# Patient Record
Sex: Female | Born: 1940 | Race: White | Hispanic: No | Marital: Married | Smoking: Former smoker
Health system: Southern US, Community
[De-identification: ages and names within clinical notes are randomized; demographics above are authoritative.]

## PROBLEM LIST (undated history)

## (undated) DIAGNOSIS — M1990 Unspecified osteoarthritis, unspecified site: Secondary | ICD-10-CM

## (undated) DIAGNOSIS — J45909 Unspecified asthma, uncomplicated: Secondary | ICD-10-CM

## (undated) DIAGNOSIS — I499 Cardiac arrhythmia, unspecified: Secondary | ICD-10-CM

## (undated) DIAGNOSIS — K5790 Diverticulosis of intestine, part unspecified, without perforation or abscess without bleeding: Secondary | ICD-10-CM

## (undated) DIAGNOSIS — K635 Polyp of colon: Secondary | ICD-10-CM

## (undated) DIAGNOSIS — Z8709 Personal history of other diseases of the respiratory system: Secondary | ICD-10-CM

## (undated) DIAGNOSIS — T4145XA Adverse effect of unspecified anesthetic, initial encounter: Secondary | ICD-10-CM

## (undated) DIAGNOSIS — I4891 Unspecified atrial fibrillation: Secondary | ICD-10-CM

## (undated) DIAGNOSIS — Z8679 Personal history of other diseases of the circulatory system: Secondary | ICD-10-CM

## (undated) DIAGNOSIS — F32A Depression, unspecified: Secondary | ICD-10-CM

## (undated) DIAGNOSIS — I10 Essential (primary) hypertension: Secondary | ICD-10-CM

## (undated) DIAGNOSIS — K8020 Calculus of gallbladder without cholecystitis without obstruction: Secondary | ICD-10-CM

## (undated) DIAGNOSIS — F329 Major depressive disorder, single episode, unspecified: Secondary | ICD-10-CM

## (undated) DIAGNOSIS — R911 Solitary pulmonary nodule: Secondary | ICD-10-CM

## (undated) DIAGNOSIS — Z9889 Other specified postprocedural states: Secondary | ICD-10-CM

## (undated) DIAGNOSIS — M84369A Stress fracture, unspecified tibia and fibula, initial encounter for fracture: Secondary | ICD-10-CM

## (undated) DIAGNOSIS — F419 Anxiety disorder, unspecified: Secondary | ICD-10-CM

## (undated) DIAGNOSIS — M8580 Other specified disorders of bone density and structure, unspecified site: Secondary | ICD-10-CM

## (undated) DIAGNOSIS — J479 Bronchiectasis, uncomplicated: Secondary | ICD-10-CM

## (undated) DIAGNOSIS — T8859XA Other complications of anesthesia, initial encounter: Secondary | ICD-10-CM

## (undated) DIAGNOSIS — G4763 Sleep related bruxism: Secondary | ICD-10-CM

## (undated) DIAGNOSIS — Z9109 Other allergy status, other than to drugs and biological substances: Secondary | ICD-10-CM

## (undated) DIAGNOSIS — R351 Nocturia: Secondary | ICD-10-CM

## (undated) DIAGNOSIS — R112 Nausea with vomiting, unspecified: Secondary | ICD-10-CM

## (undated) HISTORY — DX: Calculus of gallbladder without cholecystitis without obstruction: K80.20

## (undated) HISTORY — DX: Anxiety disorder, unspecified: F41.9

## (undated) HISTORY — PX: VAGINAL HYSTERECTOMY: SUR661

## (undated) HISTORY — PX: BREAST CYST EXCISION: SHX579

## (undated) HISTORY — DX: Essential (primary) hypertension: I10

## (undated) HISTORY — DX: Polyp of colon: K63.5

## (undated) HISTORY — DX: Unspecified atrial fibrillation: I48.91

## (undated) HISTORY — DX: Depression, unspecified: F32.A

## (undated) HISTORY — DX: Solitary pulmonary nodule: R91.1

## (undated) HISTORY — DX: Diverticulosis of intestine, part unspecified, without perforation or abscess without bleeding: K57.90

## (undated) HISTORY — PX: INCISION AND DRAINAGE BREAST ABSCESS: SUR672

## (undated) HISTORY — PX: VESICOVAGINAL FISTULA CLOSURE W/ TAH: SUR271

## (undated) HISTORY — DX: Unspecified asthma, uncomplicated: J45.909

## (undated) HISTORY — DX: Nocturia: R35.1

## (undated) HISTORY — PX: CHOLECYSTECTOMY: SHX55

## (undated) HISTORY — PX: BREAST SURGERY: SHX581

## (undated) HISTORY — DX: Major depressive disorder, single episode, unspecified: F32.9

---

## 2007-05-03 ENCOUNTER — Ambulatory Visit: Payer: Self-pay | Admitting: Emergency Medicine

## 2007-05-03 LAB — CONVERTED CEMR LAB
BUN: 15 mg/dL (ref 6–23)
CO2: 30 meq/L (ref 19–32)
GFR calc Af Amer: 107 mL/min
Glucose, Bld: 87 mg/dL (ref 70–99)
Potassium: 4.5 meq/L (ref 3.5–5.1)
Sodium: 138 meq/L (ref 135–145)

## 2007-05-07 ENCOUNTER — Ambulatory Visit: Payer: Self-pay | Admitting: Internal Medicine

## 2007-05-12 DIAGNOSIS — J45909 Unspecified asthma, uncomplicated: Secondary | ICD-10-CM | POA: Insufficient documentation

## 2007-05-12 DIAGNOSIS — R05 Cough: Secondary | ICD-10-CM

## 2007-05-12 DIAGNOSIS — N61 Mastitis without abscess: Secondary | ICD-10-CM | POA: Insufficient documentation

## 2007-05-12 DIAGNOSIS — Z9079 Acquired absence of other genital organ(s): Secondary | ICD-10-CM | POA: Insufficient documentation

## 2007-05-12 DIAGNOSIS — J309 Allergic rhinitis, unspecified: Secondary | ICD-10-CM | POA: Insufficient documentation

## 2007-05-29 ENCOUNTER — Encounter: Admission: RE | Admit: 2007-05-29 | Discharge: 2007-05-29 | Payer: Self-pay | Admitting: Internal Medicine

## 2007-06-06 ENCOUNTER — Ambulatory Visit: Payer: Self-pay | Admitting: Emergency Medicine

## 2007-08-13 ENCOUNTER — Encounter: Payer: Self-pay | Admitting: Emergency Medicine

## 2007-08-13 DIAGNOSIS — J479 Bronchiectasis, uncomplicated: Secondary | ICD-10-CM

## 2007-08-17 ENCOUNTER — Ambulatory Visit: Payer: Self-pay | Admitting: Emergency Medicine

## 2007-08-17 DIAGNOSIS — T7841XA Arthus phenomenon, initial encounter: Secondary | ICD-10-CM

## 2007-08-17 LAB — CONVERTED CEMR LAB
CO2: 31 meq/L (ref 19–32)
GFR calc Af Amer: 92 mL/min
GFR calc non Af Amer: 76 mL/min
Glucose, Bld: 94 mg/dL (ref 70–99)
Potassium: 5.1 meq/L (ref 3.5–5.1)
Sodium: 141 meq/L (ref 135–145)

## 2007-08-29 ENCOUNTER — Ambulatory Visit: Payer: Self-pay | Admitting: Cardiology

## 2007-08-30 ENCOUNTER — Ambulatory Visit: Payer: Self-pay | Admitting: Emergency Medicine

## 2007-10-30 ENCOUNTER — Ambulatory Visit: Payer: Self-pay | Admitting: Internal Medicine

## 2007-11-13 ENCOUNTER — Ambulatory Visit: Payer: Self-pay | Admitting: Internal Medicine

## 2008-05-29 ENCOUNTER — Encounter: Admission: RE | Admit: 2008-05-29 | Discharge: 2008-05-29 | Payer: Self-pay | Admitting: Obstetrics and Gynecology

## 2009-06-03 ENCOUNTER — Encounter: Admission: RE | Admit: 2009-06-03 | Discharge: 2009-06-03 | Payer: Self-pay | Admitting: Obstetrics and Gynecology

## 2010-06-04 ENCOUNTER — Encounter: Admission: RE | Admit: 2010-06-04 | Discharge: 2010-06-04 | Payer: Self-pay | Admitting: Obstetrics and Gynecology

## 2010-10-25 ENCOUNTER — Encounter: Payer: Self-pay | Admitting: Cardiovascular Disease

## 2010-10-26 ENCOUNTER — Encounter: Payer: Self-pay | Admitting: Cardiovascular Disease

## 2010-10-26 ENCOUNTER — Ambulatory Visit (INDEPENDENT_AMBULATORY_CARE_PROVIDER_SITE_OTHER): Payer: Medicare Other | Admitting: Cardiovascular Disease

## 2010-10-26 DIAGNOSIS — R079 Chest pain, unspecified: Secondary | ICD-10-CM | POA: Insufficient documentation

## 2010-10-26 MED ORDER — LORATADINE 10 MG PO TABS: 10.0000 mg | ORAL_TABLET | Freq: Every day | ORAL | Status: DC

## 2010-10-26 NOTE — Progress Notes (Signed)
History of Present Illness:   Monica Williamson is a 70 year old female who is basically healthy. She has a history of hypertension. She woke with chest pain/chest tightness earlier this week. The pain lasted for 15 or 20 minutes And Gradually resolved.  She's not had any episodes of chest pain since that time. She walks on regular basis and has not had any episodes of discomfort while exercising. She denies any PND or orthopnea. She denies any syncope or presyncope. She denies any leg swelling.  Current Outpatient Prescriptions  Medication Sig Dispense Refill  . aspirin 81 MG tablet Take 1 tablet (81 mg total) by mouth daily.  30 tablet  11  . buPROPion (WELLBUTRIN XL) 150 MG 24 hr tablet Take 1 tablet (150 mg total) by mouth daily.  30 tablet  11  . Calcium Carbonate-Vitamin D (CALCIUM + D) 600-200 MG-UNIT TABS Take 1 tablet by mouth.    0  . Cholecalciferol (VITAMIN D) 1000 UNITS capsule Take 1 capsule (1,000 Units total) by mouth daily.  30 capsule  11  . hydrochlorothiazide 25 MG tablet Take 1 tablet (25 mg total) by mouth daily.  30 tablet  11  . lactobacillus acidophilus (BACID) TABS Take 1 tablet by mouth daily.      [name] loratadine (ALLERGY) 10 MG tablet Take 1 tablet (10 mg total) by mouth daily.  30 tablet  11  . losartan (COZAAR) 50 MG tablet Take 1 tablet (50 mg total) by mouth daily.  30 tablet  11    Not on File  Past Medical History  Diagnosis Date  . Hypertension   . Asthma     Past Surgical History  Procedure Date  . Vesicovaginal fistula closure w/ tah     History  Smoking status  . Former Smoker  . Quit date: 07/18/1978  Smokeless tobacco  . Not on file    History  Alcohol Use  . 3.5 oz/week  . 7 drink(s) per week    Family History  Problem Relation Age of Onset  . Heart disease Sister     Reviw of Systems:  Reviewed in the history of present illness. All other systems are negative. Physical Exam: BP 154/62  Pulse 64  Ht 5\' 7"  (1.702 m)  Wt 156 lb (70.761  kg)  BMI 24.43 kg/m2 The patient is alert and oriented x 3.  The mood and affect are normal.  The skin is warm and dry.  Color is normal.  The HEENT exam reveals that the sclera are nonicteric.  The mucous membranes are moist.  The carotids are 2+ without bruits.  There is no thyromegaly.  There is no JVD.  The lungs are clear.  The chest wall is non tender.  The heart exam reveals a regular rate with a normal S1 and S2.  There are no murmurs, gallops, or rubs.  The PMI is not displaced.   Abdominal exam reveals good bowel sounds.  There is no guarding or rebound.  There is no hepatosplenomegaly or tenderness.  There are no masses.  Exam of the legs reveal no clubbing, cyanosis, or edema.  The legs are without rashes.  The distal pulses are intact.  Cranial nerves II - XII are intact.  Motor and sensory functions are intact.  The gait is normal.  ECG: Her EKG from [facility] reveals normal sinus rhythm. She has no ST or T wave changes. Assessment / Plan:

## 2010-10-26 NOTE — Assessment & Plan Note (Signed)
Her episode  of chest pain was somewhat atypical although it did last for 15 or 20 minutes. It Was described as a heaviness. She has a history of hypertension. For further evaluation of his chest pain I like to refer her for a stress echocardiogram. We will also get an echocardiogram at the same time. We will see her on an as-needed basis following a stress echo.

## 2010-11-08 ENCOUNTER — Ambulatory Visit (HOSPITAL_BASED_OUTPATIENT_CLINIC_OR_DEPARTMENT_OTHER): Payer: Medicare Other | Admitting: Radiology

## 2010-11-08 ENCOUNTER — Ambulatory Visit (HOSPITAL_COMMUNITY): Payer: Medicare Other | Attending: Cardiovascular Disease | Admitting: Radiology

## 2010-11-08 DIAGNOSIS — R0609 Other forms of dyspnea: Secondary | ICD-10-CM | POA: Insufficient documentation

## 2010-11-08 DIAGNOSIS — R5381 Other malaise: Secondary | ICD-10-CM | POA: Insufficient documentation

## 2010-11-08 DIAGNOSIS — R0989 Other specified symptoms and signs involving the circulatory and respiratory systems: Secondary | ICD-10-CM | POA: Insufficient documentation

## 2010-11-08 DIAGNOSIS — I059 Rheumatic mitral valve disease, unspecified: Secondary | ICD-10-CM

## 2010-11-08 DIAGNOSIS — R072 Precordial pain: Secondary | ICD-10-CM | POA: Insufficient documentation

## 2010-11-08 DIAGNOSIS — I10 Essential (primary) hypertension: Secondary | ICD-10-CM | POA: Insufficient documentation

## 2010-11-08 NOTE — Progress Notes (Addendum)
Please report the stress echo was normal

## 2010-11-09 ENCOUNTER — Other Ambulatory Visit: Payer: Self-pay | Admitting: Internal Medicine

## 2010-11-09 ENCOUNTER — Telehealth: Payer: Self-pay | Admitting: *Deleted

## 2010-11-09 DIAGNOSIS — R140 Abdominal distension (gaseous): Secondary | ICD-10-CM

## 2010-11-09 NOTE — Telephone Encounter (Signed)
Patient called with normal echo results. msg left. Alfonso Ramus RN

## 2010-11-10 ENCOUNTER — Ambulatory Visit
Admission: RE | Admit: 2010-11-10 | Discharge: 2010-11-10 | Disposition: A | Discharge status: Home / Self Care | Payer: Medicare Other | Source: Ambulatory Visit | Attending: Internal Medicine | Admitting: Internal Medicine

## 2010-11-10 DIAGNOSIS — R140 Abdominal distension (gaseous): Secondary | ICD-10-CM

## 2010-11-30 NOTE — Assessment & Plan Note (Signed)
[facility]                             PULMONARY OFFICE NOTE   NAME:Moulin, RETAJ HILBUN                 MRN:          [id number]  DATE:06/06/2007                            DOB:          1941-01-18    SUBJECTIVE:  Ms. Schlichting is a 70 year old woman who follows up today  for chronic cough and for bronchiectasis and scar noted on a CT scan of  the chest from November 08, 2006.  She returns today telling me in that in  general she does not have dyspnea.  She has been using nasal saline  washes fairly regularly, Flonase twice a day and Clarinex 5 mg daily.  Her cough is improved since she began this regimen but she still does  have frequent cough occasionally with paroxysms.  She has been using  DayQuil for symptomatic relief and this has been helpful.   CURRENT MEDICINES:  1. Estraderm patch once daily.  2. Wellbutrin, dose not known, once daily.  3. Aspirin 81 mg daily.  4. Flonase one spray in each nostril b.i.d.  5. Clarinex 5 mg daily.  6. Nasal saline washes p.r.n.  7. DayQuil p.r.n.   EXAM:  GENERAL:  This is a well-appearing, pleasant woman.  She is in no  distress.  Her weight is 153 pounds, temperature 98.2, blood pressure  146/80, heart rate 62, SPO2 99% on room air.  HEENT EXAM:  She has a narrow posterior pharynx with some erythema and  evidence of postnasal drip.  NECK:  Supple without lymphadenopathy or stridor.  LUNGS:  Clear to auscultation bilaterally.  HEART:  Regular without a murmur.  ABDOMEN AND EXTREMITIES:  Were not examined today.   A CT scan of the chest performed on May 07, 2007, showed some slight  progression of multifocal areas of bronchiectasis and inflammatory  change, particularly in the right upper and bilateral lower lobes.  There was no associated adenopathy.  Differential diagnosis includes  bronchiectasis with opportunistic atypical infection vs. another  inflammatory process.  Pulmonary function testing  was performed today.  Spirometry showed evidence for mild airflow limitation without a  bronchodilator response.  Her lung volumes were significant for  hyperinflation, diffusion capacity was normal.  Inspiratory loop of her  flow volume curve was somewhat truncated, which could be consistent with  upper airway irritation or vocal cord dysfunction, although there was  not typical chinking that is often seen with vocal cord dysfunction.   IMPRESSION:  1. Chronic cough which appears to have two distinct components.  She      has a dry upper airway cough that is likely related to posterior      pharyngeal irritation and vocal cord dysfunction in the setting of      postnasal drip.  Other contributors to this may include      gastroesophageal reflux disease, although she does not currently      have symptoms.  Of note, she also has bronchiectasis on CT and she      has had distinct episodes of what sound like bronchiectatic flares      that have occurred about  once every 6 months and which have been      treated either as bronchitis or pneumonia.  This did not appear to      be a component of her every day cough.  I have asked her to      continue to use nasal saline washes and to use them once daily.      She will change her Clarinex back to Claritin, use Flonase as she      has been doing and DayQuil on an as-needed basis.  I have also      asked her to start using omeprazole 20 mg daily empirically for a      month to see if this silenced any occult gastroesophageal reflux      disease that may be contributing to cough.  2. Bronchiectasis and ground glass infiltrates on CT scan.  We      discussed possible fiberoptic bronchoscopy with bronchoalveolar      lavage today to look for either opportunistic organisms or possibly      even to pursue biopsy.  She has decided to defer this at this time      but we will repeat a CT scan of the chest in February, 2009.  If      she has areas of  involvement at that time that are progressing then      I believe she merits either fiberoptic bronchoscopy or a VATS      biopsy to identify these areas of inflammation.  3. I will follow up with Ms. Graumann in 4 to 6 weeks to assess her      cough at that time.     Leslye Peer, MD  Electronically Signed    RSB/MedQ  DD: 06/06/2007  DT: 06/07/2007  Job #: [id number]   cc:   Kari Baars, M.D.

## 2010-11-30 NOTE — Assessment & Plan Note (Signed)
[facility]                             PULMONARY OFFICE NOTE   NAME:Monica Williamson, Monica Williamson                        MRN:          [id number]  DATE:05/03/2007                            DOB:          [birth year]    REASON FOR CONSULTATION:  I was asked by Dr. Kari Baars to evaluate  Monica Williamson for chronic cough and for an abnormal CT scan of the  chest.   BRIEF HISTORY:  Monica Williamson is a 70 year old woman with a history of  childhood asthma and significant allergic rhinitis.  She has had a long-  standing problems with post nasal drip and associated cough in the fall  and spring seasons.  She notes that this became more bothersome in the  Spring 2003.  The cough has for the most part been ascribed to her  allergic rhinitis and post nasal drip.  For the last 2 years, her  symptoms have begun to worsen.  On at least one occasion in the Fall  2007, she experienced an episode of dyspnea and chest tightness in the  setting of allergic rhinitis and a probable upper respiratory infection.  These symptoms did respond to a bronchodilator.  Other than that  episode, she has not had any symptoms reminiscent of her childhood  asthma.  Her cough has been evaluated by Dr. Clelia Croft at [facility].  She has been on Claritin and Flonase and is currently using the Flonase.  She states that her cough may have improved to some degree when this  medication was started.  Her evaluation has also included chest x-rays,  the first performed in February 2008.  These films have shown some areas  of lingular and anterior right middle lobe airspace disease felt to be  consistent with atelectatic change or scar.  In April 2008, a followup  chest x-ray again identified similar areas of airspace disease which  prompted a CT scan of her chest which was performed on November 08, 2006.  I reviewed this study today.  There was some mild peribronchial  thickening and atelectasis  particularly in the medial segment of the  right middle lobe.  There were also areas of segmental linear  atelectasis in the lingula.  Her right middle lobe abnormality was  somewhat nodular in appearance but was more consistent with scar or  resolving inflammatory change.  Monica Williamson describes an episode in August 2008, where her cough  changed in character and quality and she felt generalized weakness,  fatigue, and constitutional symptoms including fever.  A chest x-ray on  March 01, 2007, identified the same lingular and right middle lobe  changes as well as a possible right upper lobe airspace disease  consistent with community-acquired pneumonia she was treated with  antibiotics with improvement.  A followup film was performed on  March 23, 2007, which reportedly did not show any evidence of  infiltrate.  She is referred for evaluation of her chronic cough and  also to follow her abnormal CT scan of the chest.   PAST MEDICAL HISTORY:  1. Childhood asthma  that resolved as a teenager.  2. Allergic rhinitis.  3. Vaginal hysterectomy.  4. History of breast abscess.   ALLERGIES:  No known drug allergies.   MEDICATIONS:  1. Estraderm patch daily.  2. Wellbutrin SR 150 mg daily.  3. Aspirin 81 mg daily.  4. Flonase each nostril b.i.d.  5. Mucinex 2 b.i.d. p.r.n.  6. Benadryl 25 mg p.r.n.   SOCIAL HISTORY:  The patient is married.  She is retired.  She lives  with her husband.  She is a former smoker with a 10 pack-year total  history.  She quit in 1980.  She drinks 1 alcoholic beverage nightly.  She denies any significant occupational exposures.   FAMILY HISTORY:  Significant for allergies in her father and coronary  artery disease in her mother.   REVIEW OF SYSTEMS:  As per the HPI.   PHYSICAL EXAMINATION:  GENERAL:  This is a pleasant woman in no  distress.  VITAL SIGNS:  Her weight is 150 pounds, temperature is 99, blood  pressure 168/82, heart rate 77, SpO2 97%  on room air.  HEENT:  She has a narrow posterior oropharynx with some erythema and  evidence of postnasal drip.  NECK:  Supple without lymphadenopathy or stridor.  LUNGS:  Clear to auscultation bilaterally.  HEART:  Regular without a murmur.  ABDOMEN:  Benign.  EXTREMITIES:  No cyanosis, clubbing, or edema.   IMPRESSION:  This is a 70 year old woman with a history of chronic  cough.   I agree that her cough is most likely related to her allergic rhinitis  which is only marginally controlled on standard therapy.  Given her CT  scan of the chest with middle lobe and lingular abnormalities as well as  waxing and waning infiltrates that have been treated as a possible  community-acquired pneumonia, we must consider possible other  inflammatory abnormalities including BOOP or atypical infection.   PLANS:  1. I will continue her Flonase, add back a non-sedating antihistamine      to her regimen, and Clarinex once daily.  2. I have asked her to perform nasal saline washes one to two times      daily.  3. I will perform pulmonary function tests to look for possible air      flow limitation as cause for her cough, particularly given her      history of asthma.  4. I will repeat a CT scan of her chest to look for interval change in      her right middle lobe and lingular airspace disease.  Depending on      these findings, we can either plan to follow her with serial CT      scans or we may want to discuss other diagnostic modalities such as      fiberoptic bronchoscopy or even biopsy.  5. I will follow up with Monica Williamson in 1 month to review her status      and the results of her testing.     Leslye Peer, MD  Electronically Signed    RSB/MedQ  DD: 05/03/2007  DT: 05/04/2007  Job #: [id number]   cc:   Kari Baars, M.D.

## 2010-12-01 ENCOUNTER — Other Ambulatory Visit: Payer: Self-pay | Admitting: General Surgery

## 2010-12-01 ENCOUNTER — Encounter (HOSPITAL_COMMUNITY): Payer: Medicare Other

## 2010-12-01 LAB — COMPREHENSIVE METABOLIC PANEL
Albumin: 4.2 g/dL (ref 3.5–5.2)
Alkaline Phosphatase: 76 U/L (ref 39–117)
BUN: 22 mg/dL (ref 6–23)
Calcium: 10.3 mg/dL (ref 8.4–10.5)
Glucose, Bld: 91 mg/dL (ref 70–99)
Potassium: 4.8 mEq/L (ref 3.5–5.1)
Sodium: 134 mEq/L — ABNORMAL LOW (ref 135–145)
Total Protein: 7 g/dL (ref 6.0–8.3)

## 2010-12-01 LAB — DIFFERENTIAL
Basophils Absolute: 0 10*3/uL (ref 0.0–0.1)
Basophils Relative: 0 % (ref 0–1)
Eosinophils Absolute: 0.3 10*3/uL (ref 0.0–0.7)
Neutro Abs: 3.6 10*3/uL (ref 1.7–7.7)
Neutrophils Relative %: 62 % (ref 43–77)

## 2010-12-01 LAB — CBC
MCH: 30.4 pg (ref 26.0–34.0)
MCHC: 32.5 g/dL (ref 30.0–36.0)
RDW: 14.2 % (ref 11.5–15.5)

## 2010-12-01 LAB — SURGICAL PCR SCREEN
MRSA, PCR: NEGATIVE
Staphylococcus aureus: POSITIVE — AB

## 2010-12-08 ENCOUNTER — Other Ambulatory Visit (INDEPENDENT_AMBULATORY_CARE_PROVIDER_SITE_OTHER): Payer: Self-pay | Admitting: General Surgery

## 2010-12-08 ENCOUNTER — Ambulatory Visit (HOSPITAL_COMMUNITY)
Admission: RE | Admit: 2010-12-08 | Discharge: 2010-12-08 | Disposition: A | Discharge status: Home / Self Care | Payer: Medicare Other | Source: Ambulatory Visit | Attending: General Surgery | Admitting: General Surgery

## 2010-12-08 ENCOUNTER — Ambulatory Visit (HOSPITAL_COMMUNITY): Payer: Medicare Other

## 2010-12-08 DIAGNOSIS — K8020 Calculus of gallbladder without cholecystitis without obstruction: Secondary | ICD-10-CM | POA: Insufficient documentation

## 2010-12-08 NOTE — Op Note (Signed)
NAMERENESSA, WELLNITZ NO.:  [id number]  MEDICAL RECORD NO.:  [id number]           PATIENT TYPE:  O  LOCATION:  DAYL                         [facility]ACILITY[facility]  [facility]  PHYSICIAN:  Juanetta Gosling, MDDATE OF BIRTH:  1941-07-17  DATE OF PROCEDURE:  12/08/2010 DATE OF DISCHARGE:                              OPERATIVE REPORT   PREOPERATIVE DIAGNOSIS:  Symptomatic cholelithiasis.  POSTOPERATIVE DIAGNOSIS:  Symptomatic cholelithiasis.  PROCEDURE:  Laparoscopic cholecystectomy with intraoperative cholangiogram.  SURGEON:  Juanetta Gosling, MD.  ASSISTANT:  Wilmon Arms. Tsuei, M.D.  ANESTHESIA:  General.  SPECIMENS:  Gallbladder contents to pathology.  COMPLICATIONS:  None.  DRAINS:  None.  DISPOSITION:  To recovery room in stable condition.  INDICATIONS:  This 70 year old female over the last year noticed some bloating and increased gas formation and around [place] had an attack of right upper quadrant and epigastric pain.  She had been evaluated for cardiac sources and this wasnegative.  .  She underwent an ultrasound that showed a 2.4- cm gallstone.  Upon talking to her, it certainly sounded like these symptoms were referable to her gallbladder.  Her liver function tests were all normal and we discussed a laparoscopic cholecystectomy.  DESCRIPTION OF PROCEDURE:  After informed consent was obtained, the patient was taken to the operating room.  She was administered 1 g of intravenous cefoxitin.  Sequential compression devices were placed on lower extremities prior to induction of anesthesia.  She was then placed under general anesthesia without complication.  Her abdomen was prepped and draped in standard sterile surgical fashion.  Surgical time-out was then performed.  I infiltrated 0.25% Marcaine below her umbilicus.  I made an incision with an 11 blade.  I grasped the fascia, entered this with 11 blade. Her peritoneum was entered bluntly.  She had some  adhesions to the left of midline and this was all inspected.  There was no evidence of entry injury.  I then placed a 0 Vicryl pursestring suture and placed a Hassan trocar.  She was then placed in reverse Trendelenburg position and epigastric and right upper quadrant trocars were then placed under direct vision after infiltration with local anesthetic without complication.  Her gallbladder was noted to be very enlarged, this was retracted cephalad and lateral.  I dissected and obtained a critical view of safety.  I then placed a clip distal on the cystic duct.  Common duct was able to be visualized in its entirety.  I then made a ductotomy.  I introduced a Cook catheter.  A cholangiogram was performed.  The first one I had the catheter in too far and only obtained distal filling.  The second cholangiogram, I obtained filling of both sides of the liver, confirmed my presence in the cystic duct and confirmed flow into the duodenum without any abnormalities noted on the cholangiogram.  I then removed the catheter, clipped the duct 3 times, divided this.  I then treated the artery in a similar fashion.  The gallbladder was then removed from the liver bed.  This was then placed in an EndoCatch bag and removed from the umbilicus where she was noted to have  1 very large stone present.  Hemostasis was then observed. Irrigation was performed until this was clear.  I then again inspected for an entry injury and there was none.  I then tied the pursestring suture down, this obliterated the defect.  I then removed all my trocars under direct vision and desufflated the abdomen.  These were then closed with 4-0 Monocryl and Dermabond.  She tolerated this well, was extubated in the operating room, and transferred to recovery room in stable condition.     Juanetta Gosling, MD     MCW/MEDQ  D:  12/08/2010  T:  12/08/2010  Job:  [id number]  cc:   Dr. Primitivo Gauze. Marina Goodell, MD [address] [place] [address]  Electronically Signed by Emelia Loron MD on 12/08/2010 06:13:36 PM

## 2010-12-21 ENCOUNTER — Encounter: Payer: Self-pay | Admitting: Internal Medicine

## 2010-12-27 ENCOUNTER — Encounter: Payer: Self-pay | Admitting: *Deleted

## 2010-12-27 ENCOUNTER — Ambulatory Visit: Payer: Medicare Other | Admitting: Internal Medicine

## 2010-12-28 ENCOUNTER — Encounter: Payer: Self-pay | Admitting: Internal Medicine

## 2011-02-08 ENCOUNTER — Ambulatory Visit (INDEPENDENT_AMBULATORY_CARE_PROVIDER_SITE_OTHER): Payer: Medicare Other | Admitting: Internal Medicine

## 2011-02-08 ENCOUNTER — Encounter: Payer: Self-pay | Admitting: Internal Medicine

## 2011-02-08 DIAGNOSIS — R141 Gas pain: Secondary | ICD-10-CM

## 2011-02-08 DIAGNOSIS — R143 Flatulence: Secondary | ICD-10-CM

## 2011-02-08 DIAGNOSIS — R197 Diarrhea, unspecified: Secondary | ICD-10-CM

## 2011-02-08 DIAGNOSIS — R933 Abnormal findings on diagnostic imaging of other parts of digestive tract: Secondary | ICD-10-CM

## 2011-02-08 NOTE — Patient Instructions (Signed)
EGD [facility] 03/15/11 3:00 pm arrive at 2:00 pm EGD brochure given for you to review.

## 2011-02-09 ENCOUNTER — Telehealth: Payer: Self-pay | Admitting: Internal Medicine

## 2011-02-09 ENCOUNTER — Encounter: Payer: Self-pay | Admitting: Internal Medicine

## 2011-02-09 ENCOUNTER — Other Ambulatory Visit: Payer: Medicare Other | Admitting: Internal Medicine

## 2011-02-09 NOTE — Telephone Encounter (Signed)
Pts husband is concerned that the egd the pt is scheduled for may interfere with an overseas vacation they have planned. Would like it done sooner. Would it be possible to use the slot that is available on 8/17 for her egd? It is a propofol slot. Please advise.

## 2011-02-09 NOTE — Telephone Encounter (Signed)
No problem. Actually, she has a history of anxiety and is on anxiolytics and may benefit from propofol. Thanks

## 2011-02-09 NOTE — Telephone Encounter (Signed)
Moved pts appt for EGD to 03/04/11@11 :30am for propofol. Pts husband aware of the change and appt date and time.

## 2011-02-09 NOTE — Progress Notes (Signed)
HISTORY OF PRESENT ILLNESS:  Monica Williamson is a 70 y.o. female with hypertension, hyperlipidemia, and anxiety/depression. Patient was seen previously in April of 2009 4 screening colonoscopy. Examination revealed diverticulosis only. She presents at this time with complaints of bloating, increased intestinal gas, and loose stools of several years duration. She describes to 3 loose stools per day, generally in the morning. On rare occasions will have formed bowel movements were no bowel movements. No nocturnal symptoms. There is an element of urgency which is generally precipitated with meals. She describes her flatus as foul smelling. There has been no abdominal pain or bleeding. She exercises regularly. Despite this, she reports 10 pound weight gain over the past year. She has been on the probiotic Align for several months without change in symptoms. She denies any other therapies. She did undergo evaluation in April of 2012. Laboratories including CBC, comprehensive metabolic panel, thyroid-stimulating hormone, and vitamin D level were normal. Lipids were elevated. Hemoccult testing was negative. Testing for celiac sprue revealed an elevated tissue transglutaminase antibody IgG with a negative anti-endomysial antibody IgA. An abdominal ultrasound obtained it'll 25th 2012 revealed solitary gallstone. She was subsequently sent for laparoscopic cholecystectomy which was performed 12/08/2010. The procedure was uneventful. Intraoperative cholangiogram was negative. The pathology revealed chronic cholecystitis and cholelithiasis. The patient had no difficulties postoperatively. Unfortunately, her GI symptoms have persisted without change. She has not tried dietary restriction. She does drink alcohol daily.  REVIEW OF SYSTEMS:  All non-GI ROS negative except for back pain, night sweats, and excessive urination  Past Medical History  Diagnosis Date  . Hypertension   . Childhood asthma   . Diverticulosis    . Vitamin D deficiency   . Depression   . Anxiety   . Pulmonary nodule   . Nocturia     Past Surgical History  Procedure Date  . Vesicovaginal fistula closure w/ tah   . Incision and drainage breast abscess   . Vaginal hysterectomy     Social History Monica Williamson  reports that she quit smoking about 32 years ago. She does not have any smokeless tobacco history on file. She reports that she drinks alcohol. She reports that she does not use illicit drugs.  family history includes Heart disease in her sister.  There is no history of Colon cancer.  No Known Allergies     PHYSICAL EXAMINATION: Vital signs: BP 142/84  Pulse 88  Ht 5\' 7"  (1.702 m)  Wt 164 lb (74.39 kg)  BMI 25.69 kg/m2  Constitutional: generally well-appearing, no acute distress Psychiatric: alert and oriented x3, cooperative Eyes: extraocular movements intact, anicteric, conjunctiva pink Mouth: oral pharynx moist, no lesions Neck: supple no lymphadenopathy Cardiovascular: heart regular rate and rhythm, no murmur Lungs: clear to auscultation bilaterally Abdomen: soft, nontender, nondistended, no obvious ascites, no peritoneal signs, normal bowel sounds, no organomegaly Rectal: Not performed. Recent Hemoccult studies negative Extremities: no lower extremity edema bilaterally Skin: no lesions on visible extremities including elbows, knees, and scalp Neuro: No focal deficits. No asterixis.    ASSESSMENT:  #1. Chronic problems with abdominal bloating, gas, and loose stools. Colonoscopy in 2009 revealing diverticulosis only. Recent extensive workup negative except for gallstones. No improvement post cholecystectomy. Serologies suggesting possible celiac sprue. Possible causes for her symptoms include celiac disease, bacterial overgrowth, or irritable bowel syndrome. Thorough and detailed discussion today with the patient regarding these possibilities.   PLAN:  #1. Educational brochure on intestinal  gas #2. Anti-gas and flatulence dietary sheet provided #  3. Schedule upper endoscopy with small intestinal biopsies to rule out celiac sprue.The nature of the procedure, as well as the risks, benefits, and alternatives were carefully and thoroughly reviewed with the patient. Ample time for discussion and questions allowed. The patient understood, was satisfied, and agreed to proceed.  #4. If the above negative, consider empiric trial of metronidazole. #5Molli Knock to use Imodium when necessary

## 2011-03-04 ENCOUNTER — Encounter: Payer: Self-pay | Admitting: Internal Medicine

## 2011-03-04 ENCOUNTER — Ambulatory Visit (AMBULATORY_SURGERY_CENTER): Payer: Medicare Other | Admitting: Internal Medicine

## 2011-03-04 DIAGNOSIS — R142 Eructation: Secondary | ICD-10-CM

## 2011-03-04 DIAGNOSIS — R933 Abnormal findings on diagnostic imaging of other parts of digestive tract: Secondary | ICD-10-CM

## 2011-03-04 DIAGNOSIS — R197 Diarrhea, unspecified: Secondary | ICD-10-CM

## 2011-03-04 MED ORDER — SODIUM CHLORIDE 0.9 % IV SOLN: 500.0000 mL | INTRAVENOUS | Status: DC

## 2011-03-04 NOTE — Patient Instructions (Signed)
Please read your discharge instructions.  IF you have any questions or concerns, please call us at [phone]. Thank-you.

## 2011-03-07 ENCOUNTER — Telehealth: Payer: Self-pay

## 2011-03-07 NOTE — Telephone Encounter (Signed)
Left message on answering machine. 

## 2011-03-14 ENCOUNTER — Telehealth: Payer: Self-pay

## 2011-03-14 MED ORDER — METRONIDAZOLE 250 MG PO TABS: 250.0000 mg | ORAL_TABLET | Freq: Four times a day (QID) | ORAL | Status: AC

## 2011-03-14 NOTE — Telephone Encounter (Signed)
Pt aware and rx sent to the pharmacy. 

## 2011-03-14 NOTE — Telephone Encounter (Signed)
Message copied by Michele Mcalpine on Mon Mar 14, 2011 10:45 AM ------      Message from: Hilarie Fredrickson      Created: Mon Mar 14, 2011 10:37 AM       Bonita Quin, please let the patient know that her duodenal biopsies were normal. Nothing to suggest celiac sprue. I told her that we would try an empiric course of antibiotics if the biopsies were negative. If she is still interested, prescribed metronidazole 250 mg by mouth 4 times a day x10 days. Have her followup in the office to see me in about 6 weeks. Thanks      ----- Message -----         From: [facility] [place] Interface         Sent: 03/09/2011   3:43 PM           To: Yancey Flemings, MD

## 2011-03-15 ENCOUNTER — Other Ambulatory Visit: Payer: Medicare Other | Admitting: Internal Medicine

## 2011-04-12 ENCOUNTER — Encounter: Payer: Self-pay | Admitting: Internal Medicine

## 2011-04-12 ENCOUNTER — Ambulatory Visit (INDEPENDENT_AMBULATORY_CARE_PROVIDER_SITE_OTHER): Payer: Medicare Other | Admitting: Internal Medicine

## 2011-04-12 VITALS — BP 124/62 | HR 60 | Ht 67.0 in | Wt 154.0 lb

## 2011-04-12 DIAGNOSIS — R141 Gas pain: Secondary | ICD-10-CM

## 2011-04-12 DIAGNOSIS — R143 Flatulence: Secondary | ICD-10-CM

## 2011-04-12 NOTE — Patient Instructions (Signed)
Follow up as needed

## 2011-04-12 NOTE — Progress Notes (Signed)
HISTORY OF PRESENT ILLNESS:  Monica Williamson is a 70 y.o. female with hypertension, hyperlipidemia, and anxiety/depression. Patient was last evaluated in the office 02/09/2011 regarding bloating and increased intestinal gas. See that dictation for details. Also, questions regarding possible celiac disease. She subsequently underwent upper endoscopy on 03/04/2011. Duodenal biopsies were unremarkable. She was subsequently treated with empiric course of metronidazole 250 mg 4 times a day x10 days. She presents today for followup. She is pleased to report remarkable improvement in symptoms. No additional problems with gas and loose stools. Minimal bloating. She requires about dietary manipulation. She looks forward to an upcoming trip to [place]  REVIEW OF SYSTEMS:  All non-GI ROS negative.  Past Medical History  Diagnosis Date  . Hypertension   . Childhood asthma   . Diverticulosis   . Vitamin D deficiency   . Depression   . Anxiety   . Pulmonary nodule   . Nocturia     Past Surgical History  Procedure Date  . Vesicovaginal fistula closure w/ tah   . Incision and drainage breast abscess   . Vaginal hysterectomy   . Cholecystectomy     11/2010    Social History MARQUELLE BALOW  reports that she quit smoking about 32 years ago. She does not have any smokeless tobacco history on file. She reports that she drinks about 2.4 ounces of alcohol per week. She reports that she does not use illicit drugs.  family history includes Heart disease in her sister.  There is no history of Colon cancer.  No Known Allergies     PHYSICAL EXAMINATION: Vital signs: BP 124/62  Pulse 60  Ht 5\' 7"  (1.702 m)  Wt 154 lb (69.854 kg)  BMI 24.12 kg/m2 General: Well-developed, well-nourished, no acute distress Abdomen: Not reexamined Psychiatric: alert and oriented x3. Cooperative    ASSESSMENT:  #1. Recent problems with bloating, gas, and loose stools. Negative upper endoscopy with duodenal  biopsies. Improvement after empiric course of metronidazole. Possible causes include occult Giardia or bacterial overgrowth.   PLAN:  #1. Discuss strategies regarding reintroduction of certain food items. Also discussed possible on demand treatment with metronidazole should symptoms recur in the future. Otherwise, GI followup as needed

## 2011-05-09 ENCOUNTER — Other Ambulatory Visit: Payer: Self-pay | Admitting: Obstetrics and Gynecology

## 2011-05-09 DIAGNOSIS — Z1231 Encounter for screening mammogram for malignant neoplasm of breast: Secondary | ICD-10-CM

## 2011-06-15 ENCOUNTER — Ambulatory Visit
Admission: RE | Admit: 2011-06-15 | Discharge: 2011-06-15 | Disposition: A | Discharge status: Home / Self Care | Payer: Medicare Other | Source: Ambulatory Visit | Attending: Obstetrics and Gynecology | Admitting: Obstetrics and Gynecology

## 2011-06-15 DIAGNOSIS — Z1231 Encounter for screening mammogram for malignant neoplasm of breast: Secondary | ICD-10-CM

## 2011-11-08 DIAGNOSIS — I10 Essential (primary) hypertension: Secondary | ICD-10-CM | POA: Diagnosis not present

## 2011-11-08 DIAGNOSIS — E559 Vitamin D deficiency, unspecified: Secondary | ICD-10-CM | POA: Diagnosis not present

## 2011-11-08 DIAGNOSIS — M899 Disorder of bone, unspecified: Secondary | ICD-10-CM | POA: Diagnosis not present

## 2011-11-08 DIAGNOSIS — M949 Disorder of cartilage, unspecified: Secondary | ICD-10-CM | POA: Diagnosis not present

## 2011-11-15 DIAGNOSIS — Z0000 Encounter for general adult medical examination without abnormal findings: Secondary | ICD-10-CM | POA: Diagnosis not present

## 2011-11-15 DIAGNOSIS — F341 Dysthymic disorder: Secondary | ICD-10-CM | POA: Diagnosis not present

## 2011-11-15 DIAGNOSIS — I10 Essential (primary) hypertension: Secondary | ICD-10-CM | POA: Diagnosis not present

## 2011-11-15 DIAGNOSIS — Z1212 Encounter for screening for malignant neoplasm of rectum: Secondary | ICD-10-CM | POA: Diagnosis not present

## 2011-11-15 DIAGNOSIS — M949 Disorder of cartilage, unspecified: Secondary | ICD-10-CM | POA: Diagnosis not present

## 2011-12-23 ENCOUNTER — Other Ambulatory Visit: Payer: Self-pay | Admitting: Dermatology

## 2011-12-23 DIAGNOSIS — L570 Actinic keratosis: Secondary | ICD-10-CM | POA: Diagnosis not present

## 2011-12-23 DIAGNOSIS — L851 Acquired keratosis [keratoderma] palmaris et plantaris: Secondary | ICD-10-CM | POA: Diagnosis not present

## 2011-12-23 DIAGNOSIS — D485 Neoplasm of uncertain behavior of skin: Secondary | ICD-10-CM | POA: Diagnosis not present

## 2011-12-23 DIAGNOSIS — L821 Other seborrheic keratosis: Secondary | ICD-10-CM | POA: Diagnosis not present

## 2011-12-26 ENCOUNTER — Telehealth: Payer: Self-pay | Admitting: Emergency Medicine

## 2011-12-26 NOTE — Telephone Encounter (Signed)
I called pt back and scheduled a consult to re-establish w/ dr byrum (appt if 02-10-12- 1st date avail) but she asks that nurse call her back asap re: rescue inhaler. She has to leave shortly to take spouse to see his dr. [name]

## 2011-12-26 NOTE — Telephone Encounter (Signed)
Pt returned call- needs rescue inhaler asap this am for asthma. Monica Williamson

## 2011-12-26 NOTE — Telephone Encounter (Signed)
Spoke with pt to let her know that since she has not been seen in over 4 years we are unable to call in any prescriptions for her. I suggested that if she is having trouble she should see if her PCP, Dr. Clelia Croft, will call in an inhaler for her or she can go to an urgent care or the ER if needing emergency help. Pt verbalized understanding and hung up.

## 2011-12-26 NOTE — Telephone Encounter (Signed)
ATC pt line busy x 3 wcb--pt has not been seen since 08/2007 by RB

## 2011-12-29 DIAGNOSIS — K219 Gastro-esophageal reflux disease without esophagitis: Secondary | ICD-10-CM | POA: Diagnosis not present

## 2011-12-29 DIAGNOSIS — I10 Essential (primary) hypertension: Secondary | ICD-10-CM | POA: Diagnosis not present

## 2011-12-29 DIAGNOSIS — J45909 Unspecified asthma, uncomplicated: Secondary | ICD-10-CM | POA: Diagnosis not present

## 2012-01-20 DIAGNOSIS — J209 Acute bronchitis, unspecified: Secondary | ICD-10-CM | POA: Diagnosis not present

## 2012-01-20 DIAGNOSIS — I10 Essential (primary) hypertension: Secondary | ICD-10-CM | POA: Diagnosis not present

## 2012-01-20 DIAGNOSIS — J45909 Unspecified asthma, uncomplicated: Secondary | ICD-10-CM | POA: Diagnosis not present

## 2012-01-22 ENCOUNTER — Emergency Department (INDEPENDENT_AMBULATORY_CARE_PROVIDER_SITE_OTHER)
Admission: EM | Admit: 2012-01-22 | Discharge: 2012-01-22 | Disposition: A | Discharge status: Home / Self Care | Payer: Medicare Other | Source: Home / Self Care | Attending: Emergency Medicine | Admitting: Emergency Medicine

## 2012-01-22 ENCOUNTER — Encounter (HOSPITAL_COMMUNITY): Payer: Self-pay | Admitting: *Deleted

## 2012-01-22 DIAGNOSIS — J209 Acute bronchitis, unspecified: Secondary | ICD-10-CM

## 2012-01-22 DIAGNOSIS — J40 Bronchitis, not specified as acute or chronic: Secondary | ICD-10-CM

## 2012-01-22 MED ORDER — ALBUTEROL SULFATE HFA 108 (90 BASE) MCG/ACT IN AERS: 1.0000 | INHALATION_SPRAY | Freq: Four times a day (QID) | RESPIRATORY_TRACT | Status: DC | PRN

## 2012-01-22 MED ORDER — ALBUTEROL SULFATE (5 MG/ML) 0.5% IN NEBU
5.0000 mg | INHALATION_SOLUTION | Freq: Once | RESPIRATORY_TRACT | Status: AC
  Administered 2012-01-22: 5 mg via RESPIRATORY_TRACT

## 2012-01-22 MED ORDER — IPRATROPIUM BROMIDE 0.02 % IN SOLN
0.5000 mg | Freq: Once | RESPIRATORY_TRACT | Status: AC
  Administered 2012-01-22: 0.5 mg via RESPIRATORY_TRACT

## 2012-01-22 MED ORDER — ALBUTEROL SULFATE (5 MG/ML) 0.5% IN NEBU
INHALATION_SOLUTION | RESPIRATORY_TRACT | Status: AC
  Filled 2012-01-22: qty 1

## 2012-01-22 NOTE — ED Notes (Signed)
States feeling so much better; no longer has any chest pains, feels much easier to breathe now.

## 2012-01-22 NOTE — ED Notes (Signed)
Breathing treatment in progress

## 2012-01-22 NOTE — ED Notes (Signed)
C/O difficulty breathing since yesterday and sharp left chest pains when taking deep breaths.  Has used albuterol HFA without relief.  C/O productive cough with "brown, hard" sputum - started on Z-pak yesterday, and taking Flovent bid now.  Wheezing noted R>L.  Patient having hard coughing fits.  Denies fevers.

## 2012-01-22 NOTE — ED Provider Notes (Signed)
History     CSN: [id number]  Arrival date & time 01/22/12  1901   First MD Initiated Contact with Patient 01/22/12 1914      Chief Complaint  Patient presents with  . Wheezing  . Shortness of Breath  . Chest Pain    (Consider location/radiation/quality/duration/timing/severity/associated sxs/prior treatment) HPI Comments: Since yesterday patient had been having difficulties breathing wheezing and with pains when takes a deep breath. Try to use albuterol at home,couple times without relief ...describes a productive yellowish to brownish looking "hard sputum" which she saw her doctor for and was prescribed azithromycin, patient recently was also started on Flovent and her dose was increased last week to twice a day.  Patient goes on to describe how she has had similar episodes before of coughing spells with productive cough usually triggered by humidity which she has seen a pulmonologist for the past. Started coughing severely this afternoon and expressing tightness and wheezing and pain on her upper chest when she takes a deep breath or coughs.   Patient has taken antibiotics for 2 days now, denies any fevers  Patient is a 71 y.o. female presenting with wheezing, shortness of breath, and chest pain. The history is provided by the patient.  Wheezing  The current episode started 3 to 5 days ago. The onset was sudden. The problem has been unchanged. Associated symptoms include chest pain, cough, shortness of breath and wheezing. Pertinent negatives include no fever, no rhinorrhea and no sore throat.  Shortness of Breath  Associated symptoms include chest pain, cough, shortness of breath and wheezing. Pertinent negatives include no fever, no rhinorrhea and no sore throat.  Chest Pain Primary symptoms include shortness of breath, cough and wheezing. Pertinent negatives for primary symptoms include no fever, no fatigue, no palpitations, no nausea, no vomiting and no dizziness.  Pertinent  negatives for associated symptoms include no diaphoresis.     Past Medical History  Diagnosis Date  . Hypertension   . Childhood asthma   . Diverticulosis   . Vitamin d deficiency   . Depression   . Anxiety   . Pulmonary nodule   . Nocturia     Past Surgical History  Procedure Date  . Vesicovaginal fistula closure w/ tah   . Incision and drainage breast abscess   . Vaginal hysterectomy   . Cholecystectomy     11/2010    Family History  Problem Relation Age of Onset  . Heart disease Sister   . Colon cancer Neg Hx     History  Substance Use Topics  . Smoking status: Former Smoker -- 1.5 packs/day for 20 years    Quit date: 07/18/1978  . Smokeless tobacco: Not on file  . Alcohol Use: 2.4 oz/week    4 Glasses of wine per week     1-4 glasses daily     OB History    Grav Para Term Preterm Abortions TAB SAB Ect Mult Living                  Review of Systems  Constitutional: Positive for activity change. Negative for fever, diaphoresis and fatigue.  HENT: Negative for sore throat and rhinorrhea.   Respiratory: Positive for cough, shortness of breath and wheezing.   Cardiovascular: Positive for chest pain. Negative for palpitations and leg swelling.  Gastrointestinal: Negative for nausea and vomiting.  Skin: Negative for rash.  Neurological: Negative for dizziness.    Allergies  Review of patient's allergies indicates no known allergies.  Home Medications   Current Outpatient Rx  Name Route Sig Dispense Refill  . ALBUTEROL IN Inhalation Inhale into the lungs as needed.    . ALPRAZOLAM 0.5 MG PO TABS Oral Take 0.5 mg by mouth. 1/2-1 at bedtime     . ASPIRIN 81 MG PO TABS Oral Take 1 tablet (81 mg total) by mouth daily. 30 tablet 11  . ZITHROMAX Z-PAK PO Oral Take by mouth.    . BUPROPION HCL ER (XL) 150 MG PO TB24 Oral Take 1 tablet (150 mg total) by mouth daily. 30 tablet 11  . CALCIUM CARBONATE-VITAMIN D 600-200 MG-UNIT PO TABS Oral Take 1 tablet by mouth.   0  . VITAMIN D 1000 UNITS PO CAPS Oral Take 1 capsule (1,000 Units total) by mouth daily. 30 capsule 11  . FLOVENT IN Inhalation Inhale into the lungs 2 (two) times daily.    [name] HYDROCHLOROTHIAZIDE 25 MG PO TABS Oral Take 1 tablet (25 mg total) by mouth daily. 30 tablet 11  . LANSOPRAZOLE 15 MG PO CPDR Oral Take 15 mg by mouth daily as needed.      [name] LOSARTAN POTASSIUM 50 MG PO TABS Oral Take 1 tablet (50 mg total) by mouth daily. 30 tablet 11  . ALIGN 4 MG PO CAPS Oral Take 1 capsule by mouth daily.      . ALBUTEROL SULFATE HFA 108 (90 BASE) MCG/ACT IN AERS Inhalation Inhale 1-2 puffs into the lungs every 6 (six) hours as needed for wheezing or shortness of breath. 1 Inhaler 0  . LORATADINE 10 MG PO TABS Oral Take 1 tablet (10 mg total) by mouth daily. 30 tablet 11    BP 103/69  Pulse 96  Temp 99.1 F (37.3 C) (Oral)  Resp 18  SpO2 95%  Physical Exam  Nursing note and vitals reviewed. Constitutional: Vital signs are normal. She appears well-nourished.  Non-toxic appearance. She does not have a sickly appearance. She does not appear ill. She appears distressed. She is not intubated.  HENT:  Head: Normocephalic and atraumatic.  Eyes: Pupils are equal, round, and reactive to light.  Neck: Neck supple. No JVD present. No thyromegaly present.  Cardiovascular: Normal rate.  Exam reveals no gallop and no friction rub.   No murmur heard. Pulmonary/Chest: Effort normal. No accessory muscle usage. No apnea, not tachypneic and not bradypneic. She is not intubated. No respiratory distress. She has decreased breath sounds. She has wheezes. She has no rhonchi. She has no rales. She exhibits tenderness and bony tenderness. She exhibits no crepitus, no edema, no deformity, no swelling and no retraction.    Abdominal: Soft. She exhibits no distension. There is no tenderness. There is no rebound.  Musculoskeletal: Normal range of motion.  Lymphadenopathy:    She has no cervical adenopathy.    Neurological: She is alert.  Skin: No rash noted. No erythema.    ED Course  Procedures (including critical care time)  Labs Reviewed - No data to display No results found.   1. Bronchitis    environmental-induced reactive airway disease with productive cough.    MDM  Patient with bronchial congestion and chest wall pain resolve after breathing treatment with albuterol and Atrovent. Asymptomatic post breathing treatment. Patient was advised about what symptoms will warrant further evaluation in emergency department. Patient is well believes this is a portable respiratory induce chest wall pain consistent with her symptoms and exam. She was free of discomfort pain and she started expectorating significant fragments of phlegm/sputum  Jimmie Molly, MD 01/22/12 [phone]

## 2012-01-23 DIAGNOSIS — R071 Chest pain on breathing: Secondary | ICD-10-CM | POA: Diagnosis not present

## 2012-01-23 DIAGNOSIS — J209 Acute bronchitis, unspecified: Secondary | ICD-10-CM | POA: Diagnosis not present

## 2012-01-23 DIAGNOSIS — J45909 Unspecified asthma, uncomplicated: Secondary | ICD-10-CM | POA: Diagnosis not present

## 2012-01-23 DIAGNOSIS — I10 Essential (primary) hypertension: Secondary | ICD-10-CM | POA: Diagnosis not present

## 2012-02-10 ENCOUNTER — Institutional Professional Consult (permissible substitution): Payer: Medicare Other | Admitting: Emergency Medicine

## 2012-02-20 DIAGNOSIS — J45909 Unspecified asthma, uncomplicated: Secondary | ICD-10-CM | POA: Diagnosis not present

## 2012-02-20 DIAGNOSIS — I10 Essential (primary) hypertension: Secondary | ICD-10-CM | POA: Diagnosis not present

## 2012-02-20 DIAGNOSIS — R071 Chest pain on breathing: Secondary | ICD-10-CM | POA: Diagnosis not present

## 2012-04-17 DIAGNOSIS — Z23 Encounter for immunization: Secondary | ICD-10-CM | POA: Diagnosis not present

## 2012-05-14 ENCOUNTER — Other Ambulatory Visit: Payer: Self-pay | Admitting: Obstetrics and Gynecology

## 2012-05-14 DIAGNOSIS — Z1231 Encounter for screening mammogram for malignant neoplasm of breast: Secondary | ICD-10-CM

## 2012-06-18 ENCOUNTER — Ambulatory Visit
Admission: RE | Admit: 2012-06-18 | Discharge: 2012-06-18 | Disposition: A | Discharge status: Home / Self Care | Payer: Medicare Other | Source: Ambulatory Visit | Attending: Obstetrics and Gynecology | Admitting: Obstetrics and Gynecology

## 2012-06-18 DIAGNOSIS — Z1231 Encounter for screening mammogram for malignant neoplasm of breast: Secondary | ICD-10-CM

## 2012-07-18 DIAGNOSIS — M84369A Stress fracture, unspecified tibia and fibula, initial encounter for fracture: Secondary | ICD-10-CM

## 2012-07-18 HISTORY — DX: Stress fracture, unspecified tibia and fibula, initial encounter for fracture: M84.369A

## 2012-07-18 HISTORY — PX: MENISCUS REPAIR: SHX5179

## 2012-09-20 ENCOUNTER — Telehealth: Payer: Self-pay

## 2012-09-20 NOTE — Telephone Encounter (Signed)
Let patient know that a final decision regarding inclement weather had not yet been made for tomorrow morning.  I advised her to watch News 2 and gave her our weatherline number to call before coming to her 9:15am appointment.  Patient agreed.

## 2012-09-21 ENCOUNTER — Encounter (HOSPITAL_COMMUNITY): Payer: Self-pay | Admitting: *Deleted

## 2012-09-21 ENCOUNTER — Emergency Department (HOSPITAL_COMMUNITY): Payer: Medicare Other

## 2012-09-21 ENCOUNTER — Emergency Department (HOSPITAL_COMMUNITY)
Admission: EM | Admit: 2012-09-21 | Discharge: 2012-09-21 | Disposition: A | Discharge status: Home / Self Care | Payer: Medicare Other | Attending: Emergency Medicine | Admitting: Emergency Medicine

## 2012-09-21 ENCOUNTER — Ambulatory Visit: Payer: Medicare Other | Admitting: Internal Medicine

## 2012-09-21 DIAGNOSIS — Z79899 Other long term (current) drug therapy: Secondary | ICD-10-CM | POA: Insufficient documentation

## 2012-09-21 DIAGNOSIS — Z8719 Personal history of other diseases of the digestive system: Secondary | ICD-10-CM | POA: Diagnosis not present

## 2012-09-21 DIAGNOSIS — E559 Vitamin D deficiency, unspecified: Secondary | ICD-10-CM | POA: Insufficient documentation

## 2012-09-21 DIAGNOSIS — Z7982 Long term (current) use of aspirin: Secondary | ICD-10-CM | POA: Diagnosis not present

## 2012-09-21 DIAGNOSIS — F329 Major depressive disorder, single episode, unspecified: Secondary | ICD-10-CM | POA: Diagnosis not present

## 2012-09-21 DIAGNOSIS — F411 Generalized anxiety disorder: Secondary | ICD-10-CM | POA: Diagnosis not present

## 2012-09-21 DIAGNOSIS — I10 Essential (primary) hypertension: Secondary | ICD-10-CM | POA: Insufficient documentation

## 2012-09-21 DIAGNOSIS — Z87891 Personal history of nicotine dependence: Secondary | ICD-10-CM | POA: Diagnosis not present

## 2012-09-21 DIAGNOSIS — J45909 Unspecified asthma, uncomplicated: Secondary | ICD-10-CM | POA: Insufficient documentation

## 2012-09-21 DIAGNOSIS — F3289 Other specified depressive episodes: Secondary | ICD-10-CM | POA: Insufficient documentation

## 2012-09-21 DIAGNOSIS — I4891 Unspecified atrial fibrillation: Secondary | ICD-10-CM | POA: Diagnosis not present

## 2012-09-21 DIAGNOSIS — Z8679 Personal history of other diseases of the circulatory system: Secondary | ICD-10-CM | POA: Diagnosis not present

## 2012-09-21 DIAGNOSIS — Z860100 Personal history of colon polyps, unspecified: Secondary | ICD-10-CM | POA: Insufficient documentation

## 2012-09-21 LAB — BASIC METABOLIC PANEL
CO2: 25 mEq/L (ref 19–32)
Calcium: 9.5 mg/dL (ref 8.4–10.5)
Creatinine, Ser: 0.73 mg/dL (ref 0.50–1.10)
GFR calc Af Amer: >90 mL/min (ref >90)
GFR calc non Af Amer: 83 mL/min — ABNORMAL LOW (ref >90)
Sodium: 141 mEq/L (ref 135–145)

## 2012-09-21 LAB — CBC WITH DIFFERENTIAL/PLATELET
Basophils Absolute: 0 10*3/uL (ref 0.0–0.1)
Eosinophils Absolute: 0.8 10*3/uL — ABNORMAL HIGH (ref 0.0–0.7)
Eosinophils Relative: 13 % — ABNORMAL HIGH (ref 0–5)
Lymphocytes Relative: 21 % (ref 12–46)
Lymphs Abs: 1.2 10*3/uL (ref 0.7–4.0)
MCH: 32.3 pg (ref 26.0–34.0)
MCV: 93.1 fL (ref 78.0–100.0)
Neutrophils Relative %: 50 % (ref 43–77)
Platelets: 232 10*3/uL (ref 150–400)
RBC: 4.03 MIL/uL (ref 3.87–5.11)
RDW: 14.1 % (ref 11.5–15.5)
WBC: 5.6 10*3/uL (ref 4.0–10.5)

## 2012-09-21 LAB — TROPONIN I: Troponin I: <0.3 ng/mL (ref <0.30)

## 2012-09-21 MED ORDER — DILTIAZEM HCL 100 MG IV SOLR
5.0000 mg/h | Freq: Once | INTRAVENOUS | Status: AC
  Administered 2012-09-21: 5 mg/h via INTRAVENOUS

## 2012-09-21 MED ORDER — SODIUM CHLORIDE 0.9 % IV BOLUS (SEPSIS)
1000.0000 mL | Freq: Once | INTRAVENOUS | Status: AC
  Administered 2012-09-21: 1000 mL via INTRAVENOUS

## 2012-09-21 NOTE — ED Notes (Signed)
Patient present to ED with c/o palpitations.  Patient started to feel her heart palpitate this evening.  Patient denies chest pain, shortness of breath, or nausea. Patient states that she took 1/2 of xanax tonight.

## 2012-09-21 NOTE — ED Notes (Addendum)
Pt presents to ED for evaluation of palpitations.  Pt describes it as feeling her heart race.  Pt found to be in a-fib on monitor rate around 130bpm- no history of same. Admits to some dizziness but denies any other symptoms.  Pt placed on cardiac monitor upon arrival to room and IV in place.  NAD noted at this time, will continue to monitor.

## 2012-09-21 NOTE — ED Provider Notes (Signed)
History     CSN: [id number]  Arrival date & time 09/21/12  0102   First MD Initiated Contact with Patient 09/21/12 0112      Chief Complaint  Patient presents with  . Palpitations    (Consider location/radiation/quality/duration/timing/severity/associated sxs/prior treatment) HPI 72 year old female presents to emergency department from home with complaint of palpitations. She reports that she was getting ready for bed around 11 PM and at strong pulsations in her head and in her heart. She checked her blood pressure on the home machine, and reports her heart rate ranged from 120-150. She denies previous history of atrial fibrillation, but husband does have A. fib and felt that her pulse was irregular. She denies any shortness of breath, no chest pain he she denies any leg swelling, no history of thyroid problems. She reports she has had a echocardiogram done in the past preop for gallbladder surgery which was reportedly normal. Past Medical History  Diagnosis Date  . Hypertension   . Childhood asthma   . Diverticulosis   . Vitamin D deficiency   . Depression   . Anxiety   . Pulmonary nodule   . Nocturia   . Colon polyps   . Cholelithiasis     Past Surgical History  Procedure Laterality Date  . Vesicovaginal fistula closure w/ tah    . Incision and drainage breast abscess    . Vaginal hysterectomy    . Cholecystectomy      11/2010    Family History  Problem Relation Age of Onset  . Heart disease Sister   . Colon cancer Neg Hx     History  Substance Use Topics  . Smoking status: Former Smoker -- 1.50 packs/day for 20 years    Quit date: 07/18/1978  . Smokeless tobacco: Not on file  . Alcohol Use: 2.4 oz/week    4 Glasses of wine per week     Comment: 1-4 glasses daily     OB History   Grav Para Term Preterm Abortions TAB SAB Ect Mult Living                  Review of Systems  See History of Present Illness; otherwise all other systems are reviewed and  negative Allergies  Review of patient's allergies indicates no known allergies.  Home Medications   Current Outpatient Rx  Name  Route  Sig  Dispense  Refill  . acetaminophen (TYLENOL) 650 MG CR tablet   Oral   Take 1,300 mg by mouth every 8 (eight) hours as needed for pain.         [name] albuterol (PROVENTIL HFA;VENTOLIN HFA) 108 (90 BASE) MCG/ACT inhaler   Inhalation   Inhale 1-2 puffs into the lungs every 6 (six) hours as needed for wheezing or shortness of breath.   1 Inhaler   0   . ALPRAZolam (XANAX) 0.5 MG tablet   Oral   Take 0.5 mg by mouth.          [name] aspirin 81 MG tablet   Oral   Take 1 tablet (81 mg total) by mouth daily.   30 tablet   11   . buPROPion (WELLBUTRIN XL) 150 MG 24 hr tablet   Oral   Take 1 tablet (150 mg total) by mouth daily.   30 tablet   11   . Calcium Carbonate-Vitamin D (CALCIUM + D) 600-200 MG-UNIT TABS   Oral   Take 1 tablet by mouth.      0   .  Cholecalciferol (VITAMIN D) 1000 UNITS capsule   Oral   Take 1 capsule (1,000 Units total) by mouth daily.   30 capsule   11   . hydrochlorothiazide 25 MG tablet   Oral   Take 1 tablet (25 mg total) by mouth daily.   30 tablet   11   . losartan (COZAAR) 50 MG tablet   Oral   Take 1 tablet (50 mg total) by mouth daily.   30 tablet   11   . Probiotic Product (ALIGN) 4 MG CAPS   Oral   Take 1 capsule by mouth daily.           [name] EXPIRED: loratadine (ALLERGY) 10 MG tablet   Oral   Take 1 tablet (10 mg total) by mouth daily.   30 tablet   11     BP 134/52  Pulse 73  Temp(Src) 97.6 F (36.4 C) (Oral)  Resp 16  SpO2 98%  Physical Exam  Nursing note and vitals reviewed. Constitutional: She is oriented to person, place, and time. She appears well-developed and well-nourished.  HENT:  Head: Normocephalic and atraumatic.  Nose: Nose normal.  Mouth/Throat: Oropharynx is clear and moist.  Eyes: Conjunctivae and EOM are normal. Pupils are equal, round, and reactive to  light.  Neck: Normal range of motion. Neck supple. No JVD present. No tracheal deviation present. No thyromegaly present.  Cardiovascular: Normal heart sounds and intact distal pulses.  Exam reveals no gallop and no friction rub.   No murmur heard. Tachycardia with irregular irregular rhythm  Pulmonary/Chest: Effort normal and breath sounds normal. No stridor. No respiratory distress. She has no wheezes. She has no rales. She exhibits no tenderness.  Abdominal: Soft. Bowel sounds are normal. She exhibits no distension and no mass. There is no tenderness. There is no rebound and no guarding.  Musculoskeletal: Normal range of motion. She exhibits no edema and no tenderness.  Lymphadenopathy:    She has no cervical adenopathy.  Neurological: She is alert and oriented to person, place, and time. No cranial nerve deficit. She exhibits normal muscle tone. Coordination normal.  Skin: Skin is warm and dry. No rash noted. No erythema. No pallor.  Psychiatric: She has a normal mood and affect. Her behavior is normal. Judgment and thought content normal.    ED Course  Procedures (including critical care time)  CRITICAL CARE Performed by: Olivia Mackie   Total critical care time: 30 min  Critical care time was exclusive of separately billable procedures and treating other patients.  Critical care was necessary to treat or prevent imminent or life-threatening deterioration.  Critical care was time spent personally by me on the following activities: development of treatment plan with patient and/or surrogate as well as nursing, discussions with consultants, evaluation of patient's response to treatment, examination of patient, obtaining history from patient or surrogate, ordering and performing treatments and interventions, ordering and review of laboratory studies, ordering and review of radiographic studies, pulse oximetry and re-evaluation of patient's condition.   Labs Reviewed  CBC WITH  DIFFERENTIAL - Abnormal; Notable for the following:    Monocytes Relative 15 (*)    Eosinophils Relative 13 (*)    Eosinophils Absolute 0.8 (*)    All other components within normal limits  BASIC METABOLIC PANEL - Abnormal; Notable for the following:    Glucose, Bld 106 (*)    BUN 27 (*)    GFR calc non Af Amer 83 (*)    All other components  within normal limits  TROPONIN I  TSH   Dg Chest Port 1 View  09/21/2012  *RADIOLOGY REPORT*  Clinical Data: Atrial fibrillation  PORTABLE CHEST - 1 VIEW  Comparison: None.  Findings: Normal mediastinum and cardiac silhouette.  Normal pulmonary  vasculature.  No evidence of effusion, infiltrate, or pneumothorax.  No acute bony abnormality.  IMPRESSION: No acute cardiopulmonary process.   Original Report Authenticated By: Genevive Bi, M.D.      1. Atrial fibrillation with RVR       MDM  72 year old female with A. fib and RVR.  We'll get baseline labs, start on diltiazem drip. We'll plan for admission and possible cardioversion as patient is sure she knows when she went into atrial fibrillation.  Patient has had spontaneous conversion to normal sinus after diltiazem bolus and short-term drip. She feels well. She is wishing to go home. We'll have her followup with [facility] later today.        Olivia Mackie, MD 09/21/12 [phone]

## 2012-09-23 LAB — TSH: TSH: 2.669 u[IU]/mL (ref 0.350–4.500)

## 2012-09-24 ENCOUNTER — Telehealth: Payer: Self-pay | Admitting: Cardiovascular Disease

## 2012-09-24 NOTE — Telephone Encounter (Signed)
App made for tomorrow, pt accepting,

## 2012-09-24 NOTE — Telephone Encounter (Signed)
Pt's husband pt of [facility] insisting this pt see him today, was seen in er and was told to be seen on Friday but since we were closed he wants her seen today, BP  Running high , yesterday all day,  today better 131/64, denies chest pain , pressure, dizziness, lighheadedness, sob, pt is new to [facility], pls call

## 2012-09-25 ENCOUNTER — Encounter: Payer: Self-pay | Admitting: Cardiovascular Disease

## 2012-09-25 ENCOUNTER — Ambulatory Visit (INDEPENDENT_AMBULATORY_CARE_PROVIDER_SITE_OTHER): Payer: Medicare Other | Admitting: Cardiovascular Disease

## 2012-09-25 VITALS — BP 130/80 | HR 78 | Ht 68.0 in | Wt 153.0 lb

## 2012-09-25 DIAGNOSIS — I480 Paroxysmal atrial fibrillation: Secondary | ICD-10-CM | POA: Insufficient documentation

## 2012-09-25 MED ORDER — PROPRANOLOL HCL 10 MG PO TABS: 10.0000 mg | ORAL_TABLET | Freq: Four times a day (QID) | ORAL | Status: DC | PRN

## 2012-09-25 MED ORDER — WARFARIN SODIUM 2.5 MG PO TABS: ORAL_TABLET | ORAL | Status: DC

## 2012-09-25 MED ORDER — DILTIAZEM HCL ER 180 MG PO CP24: 180.0000 mg | ORAL_CAPSULE | Freq: Every day | ORAL | Status: DC

## 2012-09-25 NOTE — Assessment & Plan Note (Signed)
Monica Williamson presents today following an episode of atrial fibrillation. This was diagnosed in the emergency room. She converted very quickly after a dose of IV diltiazem.  She's not had any further episodes. She's had several of these episodes in the past.  She has a history of hypertension and she is 72 years old. She has a strong family history of atrial fibrillation complicated by stroke.  For strongly that she needs to be anticoagulated. We discussed the various methods of anticoagulation including Coumadin, Pradaxa, and Xarelto.  She would like to start Coumadin.  We'll start her on diltiazem 180 mg CD per day. We'll also give her prescription for propranolol 10 mg tablets to take on an as-needed basis. We'll have her start Coumadin 5 mg on Tuesdays and Fridays with 2.5 mg on the other days. She will see Coumadin clinic. We will see her again in the office in approximately 2 months  She had an echocardiogram performed a year and half ago which revealed normal left ventricular systolic function. She has mild mitral regurgitation.

## 2012-09-25 NOTE — Progress Notes (Signed)
Roxine Caddy Date of Birth  [birth year]       [facility] Office [address]  [address] [place], [place]  [address]   [place], [place]  [address] [phone]     [phone]   Fax  [phone]    Fax [phone]  Problem List: 1. Paroxysmal Atrial fibrillation 2. Hypertension 3. Asthma  History of Present Illness:  Bianey is a 63 w yo with hx of PAF.  She had a severe episode of palpitations and head pounding.  Thursday night, she had a prolonged episode of palpitations and went to the ER and was found to have atrial fibrillation.  She received Iv diltiazem and converted to NSR.  She has a family hx of atrial fibrillation.  Mother and older sister have atrial fib and strokes.   She avoids salt, exercises regularly.  Current Outpatient Prescriptions on File Prior to Visit  Medication Sig Dispense Refill  . acetaminophen (TYLENOL) 650 MG CR tablet Take 1,300 mg by mouth every 8 (eight) hours as needed for pain.      [name] albuterol (PROVENTIL HFA;VENTOLIN HFA) 108 (90 BASE) MCG/ACT inhaler Inhale 1-2 puffs into the lungs every 6 (six) hours as needed for wheezing or shortness of breath.  1 Inhaler  0  . ALPRAZolam (XANAX) 0.5 MG tablet Take 0.5 mg by mouth.       [name] aspirin 81 MG tablet Take 1 tablet (81 mg total) by mouth daily.  30 tablet  11  . buPROPion (WELLBUTRIN XL) 150 MG 24 hr tablet Take 1 tablet (150 mg total) by mouth daily.  30 tablet  11  . Calcium Carbonate-Vitamin D (CALCIUM + D) 600-200 MG-UNIT TABS Take 1 tablet by mouth.    0  . Cholecalciferol (VITAMIN D) 1000 UNITS capsule Take 1 capsule (1,000 Units total) by mouth daily.  30 capsule  11  . hydrochlorothiazide 25 MG tablet Take 1 tablet (25 mg total) by mouth daily.  30 tablet  11  . losartan (COZAAR) 50 MG tablet Take 1 tablet (50 mg total) by mouth daily.  30 tablet  11  . Probiotic Product (ALIGN) 4 MG CAPS Take 1 capsule by mouth daily.          No current facility-administered medications on file prior to visit.    No Known Allergies  Past Medical History  Diagnosis Date  . Hypertension   . Childhood asthma   . Diverticulosis   . Vitamin D deficiency   . Depression   . Anxiety   . Pulmonary nodule   . Nocturia   . Colon polyps   . Cholelithiasis     Past Surgical History  Procedure Laterality Date  . Vesicovaginal fistula closure w/ tah    . Incision and drainage breast abscess    . Vaginal hysterectomy    . Cholecystectomy      11/2010    History  Smoking status  . Former Smoker -- 1.50 packs/day for 20 years  . Quit date: 07/18/1978  Smokeless tobacco  . Not on file    History  Alcohol Use  . 2.4 oz/week  . 4 Glasses of wine per week    Comment: 1-4 glasses daily     Family History  Problem Relation Age of Onset  . Heart disease Sister   . Colon cancer Neg Hx     Reviw of Systems:  Reviewed in the HPI.  All other systems are negative.  Physical Exam: Blood pressure 130/80, pulse 78, height 5\' 8"  (1.727 m), weight 153 lb (69.4 kg). General: Well developed, well nourished, in no acute distress.  Head: Normocephalic, atraumatic, sclera non-icteric, mucus membranes are moist,   Neck: Supple. Carotids are 2 + without bruits. No JVD   Lungs: Clear   Heart: RR, soft systolic murmur  Abdomen: Soft, non-tender, non-distended with normal bowel sounds.  I was able to palpitate her abdomina aorta  Msk:  Strength and tone are normal \  Extremities: No clubbing or cyanosis. No edema.  Distal pedal pulses are 2+ and equal    Neuro: CN II - XII intact.  Alert and oriented X 3.   Psych:  Normal   ECG: September 21, 2012:  Atrial fib at rate of 127.    Assessment / Plan:

## 2012-09-25 NOTE — Patient Instructions (Addendum)
Xarelto 20 mg a day Pradaxa 150 mg twice a day  Your physician has recommended you make the following change in your medication:   START DILTIAZEM CD 180 MG DAILY START PROPRANOLOL 10 MG AS NEEDED FOR PALPITATIONS UP TO 4 TIMES DAILY COUMADIN 5MG  ON Tuesday AND Friday, 2.5MG  ALL OTHER DAYS  FOLLOW UP WITH COUMADIN CLINIC MONDAY

## 2012-09-27 DIAGNOSIS — I4891 Unspecified atrial fibrillation: Secondary | ICD-10-CM | POA: Diagnosis not present

## 2012-09-27 DIAGNOSIS — J45909 Unspecified asthma, uncomplicated: Secondary | ICD-10-CM | POA: Diagnosis not present

## 2012-09-27 DIAGNOSIS — I10 Essential (primary) hypertension: Secondary | ICD-10-CM | POA: Diagnosis not present

## 2012-10-01 ENCOUNTER — Ambulatory Visit (INDEPENDENT_AMBULATORY_CARE_PROVIDER_SITE_OTHER): Payer: Medicare Other | Admitting: *Deleted

## 2012-10-01 DIAGNOSIS — Z7901 Long term (current) use of anticoagulants: Secondary | ICD-10-CM | POA: Insufficient documentation

## 2012-10-01 DIAGNOSIS — I4891 Unspecified atrial fibrillation: Secondary | ICD-10-CM

## 2012-10-01 LAB — POCT INR: INR: 1.1

## 2012-10-01 NOTE — Patient Instructions (Signed)
A full discussion of the nature of anticoagulants has been carried out.  A benefit risk analysis has been presented to the patient, so that they understand the justification for choosing anticoagulation at this time. The need for frequent and regular monitoring, precise dosage adjustment and compliance is stressed.  Side effects of potential bleeding are discussed.  The patient should avoid any OTC items containing aspirin or ibuprofen, and should avoid great swings in general diet.  Avoid alcohol consumption.  Call if any signs of abnormal bleeding. Clinic phone number [phone].  

## 2012-10-05 ENCOUNTER — Ambulatory Visit (INDEPENDENT_AMBULATORY_CARE_PROVIDER_SITE_OTHER): Payer: Medicare Other

## 2012-10-05 DIAGNOSIS — Z7901 Long term (current) use of anticoagulants: Secondary | ICD-10-CM

## 2012-10-05 DIAGNOSIS — I4891 Unspecified atrial fibrillation: Secondary | ICD-10-CM

## 2012-10-06 ENCOUNTER — Telehealth: Payer: Self-pay | Admitting: Adult Health

## 2012-10-06 NOTE — Telephone Encounter (Signed)
Error

## 2012-10-06 NOTE — Telephone Encounter (Signed)
Patient called stating she was increased on dose of warfarin to 7.5 mg daily. With new dose, different color of pill, from orange to green coating. Since starting higher dose, she has broken out in an itchy rash all over her torso. She is concerned that new pill coating may be causing a reaction.  She is taking benedryl for symptomatic relief.  I have asked her to stop the new dose, and go back to 5 mg dose that she tolerated. May need to take one and one half tablets of the 5 mg.  She will need a phone call on Monday for follow up. Needs to be seen in coumadin clinic as well.

## 2012-10-07 ENCOUNTER — Telehealth: Payer: Self-pay | Admitting: Nurse Practitioner

## 2012-10-07 NOTE — Telephone Encounter (Signed)
Coumadin clinic to address the pill issue.  Benadryl is oK to take for the rash

## 2012-10-07 NOTE — Telephone Encounter (Signed)
Pt called in again this AM re: rash and itching.  She did not take coumadin last night.  She awoke this AM and again noted rash across torso but no itching.  She took her diltiazem this AM and immediately started itching.  She was started on both meds at the same time.  I advised that she continue to hold coumadin tonight and hold dilt in the AM.  She will require a drug holiday and then either switch of meds (bb and NOAT) or slow re-addition of meds, 1 at a time.  I've asked her to call back into the office tomorrow so that we can arrange for office f/u to come up with a firmer plan.  She verbalized understanding.

## 2012-10-08 ENCOUNTER — Telehealth: Payer: Self-pay | Admitting: Cardiovascular Disease

## 2012-10-08 ENCOUNTER — Encounter: Payer: Self-pay | Admitting: Cardiovascular Disease

## 2012-10-08 ENCOUNTER — Ambulatory Visit (INDEPENDENT_AMBULATORY_CARE_PROVIDER_SITE_OTHER): Payer: Medicare Other | Admitting: Cardiovascular Disease

## 2012-10-08 VITALS — BP 150/80 | HR 70 | Ht 68.0 in | Wt 151.0 lb

## 2012-10-08 DIAGNOSIS — I4891 Unspecified atrial fibrillation: Secondary | ICD-10-CM | POA: Diagnosis not present

## 2012-10-08 DIAGNOSIS — Z7901 Long term (current) use of anticoagulants: Secondary | ICD-10-CM | POA: Diagnosis not present

## 2012-10-08 MED ORDER — RIVAROXABAN 20 MG PO TABS: 20.0000 mg | ORAL_TABLET | Freq: Every day | ORAL | Status: DC

## 2012-10-08 NOTE — Assessment & Plan Note (Signed)
She is stable.  In NSR.

## 2012-10-08 NOTE — Progress Notes (Signed)
Monica Williamson Date of Birth  1940-11-21       [facility] Office [address]  [address] [place], [place]  [address]   [place], [place]  [address] [phone]     [phone]   Fax  [phone]    Fax [phone]  Problem List: 1. Paroxysmal Atrial fibrillation 2. Hypertension 3. Asthma  History of Present Illness:  Monica Williamson is a 83 w yo with hx of PAF.  She had a severe episode of palpitations and head pounding.  Thursday night, she had a prolonged episode of palpitations and went to the ER and was found to have atrial fibrillation.  She received Iv diltiazem and converted to NSR.  She has a family hx of atrial fibrillation.  Mother and older sister have atrial fib and strokes.   She avoids salt, exercises regularly.  October 08, 2012:  Monica Williamson has developed a drug rash since I last saw her.  She recently started Warfarin and Diltiazem.  She has held the warfarin for 3 days and the diltiazem for 1 day.  The rash has improved since she stopped these medications.   Current Outpatient Prescriptions on File Prior to Visit  Medication Sig Dispense Refill  . acetaminophen (TYLENOL) 650 MG CR tablet Take 1,300 mg by mouth every 8 (eight) hours as needed for pain.      [name] albuterol (PROVENTIL HFA;VENTOLIN HFA) 108 (90 BASE) MCG/ACT inhaler Inhale 1-2 puffs into the lungs every 6 (six) hours as needed for wheezing or shortness of breath.  1 Inhaler  0  . ALPRAZolam (XANAX) 0.5 MG tablet Take 0.5 mg by mouth.       [name] aspirin 81 MG tablet Take 1 tablet (81 mg total) by mouth daily.  30 tablet  11  . buPROPion (WELLBUTRIN XL) 150 MG 24 hr tablet Take 1 tablet (150 mg total) by mouth daily.  30 tablet  11  . Calcium Carbonate-Vitamin D (CALCIUM + D) 600-200 MG-UNIT TABS Take 1 tablet by mouth.    0  . Cholecalciferol (VITAMIN D) 1000 UNITS capsule Take 1 capsule (1,000 Units total) by mouth daily.  30 capsule  11  . diltiazem  (DILACOR XR) 180 MG 24 hr capsule Take 1 capsule (180 mg total) by mouth daily.  30 capsule  5  . hydrochlorothiazide 25 MG tablet Take 1 tablet (25 mg total) by mouth daily.  30 tablet  11  . loratadine (CLARITIN) 10 MG tablet Take 10 mg by mouth daily.      [name] losartan (COZAAR) 50 MG tablet Take 1 tablet (50 mg total) by mouth daily.  30 tablet  11  . Probiotic Product (ALIGN) 4 MG CAPS Take 1 capsule by mouth daily.        . propranolol (INDERAL) 10 MG tablet Take 1 tablet (10 mg total) by mouth 4 (four) times daily as needed (FOR PALPITATIONS Can take ONE every 30 minutes Up to 4 Doses).  30 tablet  5  . warfarin (COUMADIN) 2.5 MG tablet Take 5 mg (2 - 2.5mg  Tablets) Tuesday, Friday, Take 2.5 mg All other Days  40 tablet  0   No current facility-administered medications on file prior to visit.    No Known Allergies  Past Medical History  Diagnosis Date  . Hypertension   . Childhood asthma   . Diverticulosis   . Vitamin D deficiency   . Depression   .  Anxiety   . Pulmonary nodule   . Nocturia   . Colon polyps   . Cholelithiasis     Past Surgical History  Procedure Laterality Date  . Vesicovaginal fistula closure w/ tah    . Incision and drainage breast abscess    . Vaginal hysterectomy    . Cholecystectomy      11/2010    History  Smoking status  . Former Smoker -- 1.50 packs/day for 20 years  . Quit date: 07/18/1978  Smokeless tobacco  . Not on file    History  Alcohol Use  . 2.4 oz/week  . 4 Glasses of wine per week    Comment: 1-4 glasses daily     Family History  Problem Relation Age of Onset  . Heart disease Sister   . Colon cancer Neg Hx     Reviw of Systems:  Reviewed in the HPI.  All other systems are negative.  Physical Exam: Blood pressure 150/80, pulse 70, height 5\' 8"  (1.727 m), weight 151 lb (68.493 kg), SpO2 98.00%. General: Well developed, well nourished, in no acute distress.  Head: Normocephalic, atraumatic, sclera non-icteric, mucus  membranes are moist,   Neck: Supple. Carotids are 2 + without bruits. No JVD   Lungs: Clear   Heart: RR, soft systolic murmur  Abdomen: Soft, non-tender, non-distended with normal bowel sounds.  I was able to palpitate her abdomina aorta  Msk:  Strength and tone are normal \  Extremities: No clubbing or cyanosis. No edema.  Distal pedal pulses are 2+ and equal    Neuro: CN II - XII intact.  Alert and oriented X 3.   Psych:  Normal   ECG: September 21, 2012:  Atrial fib at rate of 127.    Assessment / Plan:

## 2012-10-08 NOTE — Assessment & Plan Note (Signed)
I think she had a drug reaction to warfarin ( likely one of the dyes in the warfarin).  She has kept on taking the Diltiazem and the rash started to clear .   Start Xarelto 20 mg a day.  Keep apt. in May.  She will call us back sooner if needed.

## 2012-10-08 NOTE — Telephone Encounter (Signed)
Pt given app today. 

## 2012-10-08 NOTE — Patient Instructions (Addendum)
Your physician recommends that you schedule a follow-up appointment in: 12/13/12  Your physician has recommended you make the following change in your medication:   Stop coumadin due to allergic reaction Start xarelto 20 mg daily.

## 2012-10-08 NOTE — Telephone Encounter (Signed)
Pt given app today.

## 2012-10-08 NOTE — Telephone Encounter (Signed)
Plz return call to patient at [phone]  Patient talked to on call PA over the weekend due to rash, was advised to hold coumadin & diltiazem (since Friday Night) and wait for return call from office on Monday.  Patient has not heard anything and is concerned about not taking her coumadin Meds. Please call patient to discuss and advise.

## 2012-10-09 ENCOUNTER — Telehealth: Payer: Self-pay | Admitting: Cardiovascular Disease

## 2012-10-09 NOTE — Telephone Encounter (Signed)
Message in error    Message in error

## 2012-10-09 NOTE — Telephone Encounter (Signed)
Unable to reach pt or leave a message to let pt know will forward for dr nahser review.

## 2012-10-09 NOTE — Telephone Encounter (Signed)
Jodette, will you write this letter and I'll sign it tomorrow..  Thanks

## 2012-10-09 NOTE — Telephone Encounter (Signed)
New proplem   Pt came in office yesterday 10/08/12 and was started on new medication Xarelto. Pt want Dr Melburn Popper  to write letter of necessity so medication would be cheaper. Letter need to stated Name, DOB, Medication she was allergic to and new medication which is Xarelto she was put on this information needs to be within the letter pt need tier reduction cost. You can mail letter to [facility] [address] [address] [place], [place] [address]. Attn: Medicare Admin Appeal. If you have any question please call pt.

## 2012-10-09 NOTE — Telephone Encounter (Signed)
Message in Error    Message in Error

## 2012-10-11 ENCOUNTER — Encounter: Payer: Self-pay | Admitting: *Deleted

## 2012-10-11 DIAGNOSIS — J45909 Unspecified asthma, uncomplicated: Secondary | ICD-10-CM | POA: Diagnosis not present

## 2012-10-11 DIAGNOSIS — I4891 Unspecified atrial fibrillation: Secondary | ICD-10-CM | POA: Diagnosis not present

## 2012-10-11 NOTE — Telephone Encounter (Signed)
Called insurance agent/ wanda and discussed need for a price reduction for Xarelto 20 mg since she has failed warfarin due to medication allergy. Discussed further with agent /Cheryl, medication currently is a tier 2 drug, this is the lowest cost it can be, I can fax a letter to lower her copay to fax# [phone] to include all information mentioned below. See letter.

## 2012-10-18 NOTE — Telephone Encounter (Signed)
MSG LEFT WITH PT TO UPDATE HER TO INS STATUS.

## 2012-10-22 ENCOUNTER — Ambulatory Visit (INDEPENDENT_AMBULATORY_CARE_PROVIDER_SITE_OTHER): Payer: Medicare Other | Admitting: Internal Medicine

## 2012-10-22 ENCOUNTER — Encounter: Payer: Self-pay | Admitting: Internal Medicine

## 2012-10-22 VITALS — BP 136/72 | HR 67 | Ht 66.5 in | Wt 153.4 lb

## 2012-10-22 DIAGNOSIS — R197 Diarrhea, unspecified: Secondary | ICD-10-CM

## 2012-10-22 DIAGNOSIS — R141 Gas pain: Secondary | ICD-10-CM | POA: Diagnosis not present

## 2012-10-22 DIAGNOSIS — R198 Other specified symptoms and signs involving the digestive system and abdomen: Secondary | ICD-10-CM

## 2012-10-22 DIAGNOSIS — R143 Flatulence: Secondary | ICD-10-CM

## 2012-10-22 DIAGNOSIS — R142 Eructation: Secondary | ICD-10-CM | POA: Diagnosis not present

## 2012-10-22 MED ORDER — METRONIDAZOLE 250 MG PO TABS: 250.0000 mg | ORAL_TABLET | Freq: Four times a day (QID) | ORAL | Status: DC

## 2012-10-22 NOTE — Progress Notes (Signed)
HISTORY OF PRESENT ILLNESS:  Monica Williamson is a 72 y.o. female with hypertension, hyperlipidemia, anxiety/depression. She was last evaluated in the office 04/12/2011 regarding increased intestinal gas and bloating. She was treated with course of metronidazole and improved. She had undergone upper endoscopy with duodenal biopsies to rule out celiac disease August 2012. Previous colonoscopy in April 2009 was normal except for diverticulosis. She presents today with a chief complaint of increased flatus. No weight loss, bleeding, or abdominal pain. She also mentioned that she been having loose stools for about 6 months, often after meals. However no problems over the past 2-3 weeks. She thinks cutting back on caffeine may have been helpful. Digestive problems seem to have begun after her cholecystectomy in May of 2012. She has been taking the probiotic align daily without improvement in gas  REVIEW OF SYSTEMS:  All non-GI ROS negative except for irregular heart rate  Past Medical History  Diagnosis Date  . Hypertension   . Childhood asthma   . Diverticulosis   . Vitamin D deficiency   . Depression   . Anxiety   . Pulmonary nodule   . Nocturia   . Colon polyps   . Cholelithiasis   . Atrial fibrillation     when takes Warfarin    Past Surgical History  Procedure Laterality Date  . Vesicovaginal fistula closure w/ tah    . Incision and drainage breast abscess    . Vaginal hysterectomy    . Cholecystectomy      11/2010    Social History CECLIA Williamson  reports that she quit smoking about 34 years ago. She does not have any smokeless tobacco history on file. She reports that she drinks about 2.4 ounces of alcohol per week. She reports that she does not use illicit drugs.  family history includes Heart disease in her sister.  There is no history of Colon cancer.  Allergies  Allergen Reactions  . Warfarin And Related Hives and Itching       PHYSICAL EXAMINATION: Vital  signs: BP 136/72  Pulse 67  Ht 5' 6.5" (1.689 m)  Wt 153 lb 6 oz (69.57 kg)  BMI 24.39 kg/m2  SpO2 98%  Constitutional: generally well-appearing, no acute distress Psychiatric: alert and oriented x3, cooperative Eyes: extraocular movements intact, anicteric, conjunctiva pink Mouth: oral pharynx moist, no lesions Neck: supple no lymphadenopathy Cardiovascular: heart regular rate and rhythm, no murmur Lungs: clear to auscultation bilaterally Abdomen: soft, nontender, nondistended, no obvious ascites, no peritoneal signs, normal bowel sounds, no organomegaly Extremities: no lower extremity edema bilaterally Skin: no lesions on visible extremities Neuro: No focal deficits.   ASSESSMENT:  #1. Increased intestinal gas as manifested by flatus. No alarm features. Chronic. Responded previously to course of metronidazole #2. Intermittent problems with postprandial loose stools. Likely related to prior cholecystectomy. Better recently. Could consider Questran if needed. #3. Prior upper endoscopy in 2012 unremarkable and colonoscopy in 2009 with diverticulosis only   PLAN:  #1. Stop align #2. Empiric course of metronidazole 250 mg 4 times a day x10 days. If this helps, given her 2 refills if she continues on demand in the future #3. Discussion today on the pathophysiology of intestinal gas. #4. Literature provided on intestinal gas as well as an anti-gas and flatulence dietary sheet #5. GI followup as needed

## 2012-10-22 NOTE — Patient Instructions (Addendum)
We have sent the following medications to your pharmacy for you to pick up at your convenience:  Metronidazole  You have been given some information on gas.

## 2012-10-25 ENCOUNTER — Encounter: Payer: Self-pay | Admitting: *Deleted

## 2012-10-30 DIAGNOSIS — M25559 Pain in unspecified hip: Secondary | ICD-10-CM | POA: Diagnosis not present

## 2012-10-30 DIAGNOSIS — M1710 Unilateral primary osteoarthritis, unspecified knee: Secondary | ICD-10-CM | POA: Diagnosis not present

## 2012-11-12 DIAGNOSIS — E559 Vitamin D deficiency, unspecified: Secondary | ICD-10-CM | POA: Diagnosis not present

## 2012-11-12 DIAGNOSIS — M949 Disorder of cartilage, unspecified: Secondary | ICD-10-CM | POA: Diagnosis not present

## 2012-11-12 DIAGNOSIS — I10 Essential (primary) hypertension: Secondary | ICD-10-CM | POA: Diagnosis not present

## 2012-11-13 ENCOUNTER — Ambulatory Visit (INDEPENDENT_AMBULATORY_CARE_PROVIDER_SITE_OTHER): Payer: Medicare Other | Admitting: Cardiovascular Disease

## 2012-11-13 ENCOUNTER — Encounter: Payer: Self-pay | Admitting: Cardiovascular Disease

## 2012-11-13 VITALS — BP 124/72 | HR 68 | Ht 67.0 in | Wt 148.1 lb

## 2012-11-13 DIAGNOSIS — I4891 Unspecified atrial fibrillation: Secondary | ICD-10-CM | POA: Diagnosis not present

## 2012-11-13 MED ORDER — DILTIAZEM HCL ER 180 MG PO CP24: 180.0000 mg | ORAL_CAPSULE | Freq: Every day | ORAL | Status: DC

## 2012-11-13 NOTE — Assessment & Plan Note (Addendum)
Monica Williamson has done very well. She's not having any episodes of arrhythmia or palpitations. The diltiazem seems to be controlling her rate very well. She is tolerating the Xarelto quite well.   Hopefully the insurance company will allow her to pay a lower tear twice for the Xarelto.  She had an allergic reaction to the coumadin.  We'll continue with her same medications. I'll see her again in 6 months for an office visit and EKG.

## 2012-11-13 NOTE — Patient Instructions (Addendum)
Your physician wants you to follow-up in: 6 months with a EKG. You will receive a reminder letter in the mail two months in advance. If you don't receive a letter, please call our office to schedule the follow-up appointment.

## 2012-11-13 NOTE — Progress Notes (Signed)
Monica Williamson Date of Birth  1941/01/14       [facility] Office [address]  [address] [place], [place]  [address]   [place], [place]  [address] [phone]     [phone]   Fax  [phone]    Fax [phone]  Problem List: 1. Paroxysmal Atrial fibrillation 2. Hypertension 3. Asthma  History of Present Illness:  Monica Williamson is a 10 w yo with hx of PAF.  She had a severe episode of palpitations and head pounding.  Thursday night, she had a prolonged episode of palpitations and went to the ER and was found to have atrial fibrillation.  She received Iv diltiazem and converted to NSR.  She has a family hx of atrial fibrillation.  Mother and older sister have atrial fib and strokes.   She avoids salt, exercises regularly.  October 08, 2012:  Monica Williamson has developed a drug rash since I last saw her.  She recently started Warfarin and Diltiazem.  She has held the warfarin for 3 days and the diltiazem for 1 day.  The rash has improved since she stopped these medications.  November 13, 2012  Monica Williamson has done well since I last saw her. She thinks that her drug rash earlier this spring was do to the Coumadin. She has done well on the Xarelto.   She is still appealing the  insurance company to pay for the  Xarelto.   She is feeling very well.  Current Outpatient Prescriptions on File Prior to Visit  Medication Sig Dispense Refill  . acetaminophen (TYLENOL) 650 MG CR tablet Take 1,300 mg by mouth every 8 (eight) hours as needed for pain.      [name] ALPRAZolam (XANAX) 0.5 MG tablet Take 0.5 mg by mouth.       [name] aspirin 81 MG tablet Take 1 tablet (81 mg total) by mouth daily.  30 tablet  11  . buPROPion (WELLBUTRIN XL) 150 MG 24 hr tablet Take 1 tablet (150 mg total) by mouth daily.  30 tablet  11  . Calcium Carbonate-Vitamin D (CALCIUM + D) 600-200 MG-UNIT TABS Take 1 tablet by mouth.    0  . Cholecalciferol (VITAMIN D) 1000 UNITS  capsule Take 1 capsule (1,000 Units total) by mouth daily.  30 capsule  11  . hydrochlorothiazide 25 MG tablet Take 1 tablet (25 mg total) by mouth daily.  30 tablet  11  . loratadine (CLARITIN) 10 MG tablet Take 10 mg by mouth daily.      [name] losartan (COZAAR) 50 MG tablet Take 1 tablet (50 mg total) by mouth daily.  30 tablet  11  . propranolol (INDERAL) 10 MG tablet Take 1 tablet (10 mg total) by mouth 4 (four) times daily as needed (FOR PALPITATIONS Can take ONE every 30 minutes Up to 4 Doses).  30 tablet  5  . Rivaroxaban (XARELTO) 20 MG TABS Take 1 tablet (20 mg total) by mouth daily.  30 tablet  11   No current facility-administered medications on file prior to visit.    Allergies  Allergen Reactions  . Warfarin And Related Hives and Itching    Past Medical History  Diagnosis Date  . Hypertension   . Childhood asthma   . Diverticulosis   . Vitamin D deficiency   . Depression   . Anxiety   . Pulmonary nodule   . Nocturia   . Colon polyps   .  Cholelithiasis   . Atrial fibrillation     when takes Warfarin    Past Surgical History  Procedure Laterality Date  . Vesicovaginal fistula closure w/ tah    . Incision and drainage breast abscess    . Vaginal hysterectomy    . Cholecystectomy      11/2010    History  Smoking status  . Former Smoker -- 1.50 packs/day for 20 years  . Quit date: 07/18/1978  Smokeless tobacco  . Not on file    History  Alcohol Use  . 2.4 oz/week  . 4 Glasses of wine per week    Comment: 1-4 glasses daily     Family History  Problem Relation Age of Onset  . Heart disease Sister   . Colon cancer Neg Hx     Reviw of Systems:  Reviewed in the HPI.  All other systems are negative.  Physical Exam: Blood pressure 124/72, pulse 68, height 5\' 7"  (1.702 m), weight 148 lb 1.9 oz (67.187 kg). General: Well developed, well nourished, in no acute distress.  Head: Normocephalic, atraumatic, sclera non-icteric, mucus membranes are moist,    Neck: Supple. Carotids are 2 + without bruits. No JVD   Lungs: Clear   Heart: RR, soft systolic murmur  Abdomen: Soft, non-tender, non-distended with normal bowel sounds.  I was able to palpitate her abdomina aorta  Msk:  Strength and tone are normal \  Extremities: No clubbing or cyanosis. No edema.  Distal pedal pulses are 2+ and equal    Neuro: CN II - XII intact.  Alert and oriented X 3.   Psych:  Normal   ECG:    Assessment / Plan:

## 2012-11-16 DIAGNOSIS — M1710 Unilateral primary osteoarthritis, unspecified knee: Secondary | ICD-10-CM | POA: Diagnosis not present

## 2012-11-19 DIAGNOSIS — J45909 Unspecified asthma, uncomplicated: Secondary | ICD-10-CM | POA: Diagnosis not present

## 2012-11-19 DIAGNOSIS — E559 Vitamin D deficiency, unspecified: Secondary | ICD-10-CM | POA: Diagnosis not present

## 2012-11-19 DIAGNOSIS — Z0000 Encounter for general adult medical examination without abnormal findings: Secondary | ICD-10-CM | POA: Diagnosis not present

## 2012-11-19 DIAGNOSIS — M899 Disorder of bone, unspecified: Secondary | ICD-10-CM | POA: Diagnosis not present

## 2012-11-19 DIAGNOSIS — I4891 Unspecified atrial fibrillation: Secondary | ICD-10-CM | POA: Diagnosis not present

## 2012-11-19 DIAGNOSIS — M949 Disorder of cartilage, unspecified: Secondary | ICD-10-CM | POA: Diagnosis not present

## 2012-11-19 DIAGNOSIS — G4700 Insomnia, unspecified: Secondary | ICD-10-CM | POA: Diagnosis not present

## 2012-11-19 DIAGNOSIS — Z23 Encounter for immunization: Secondary | ICD-10-CM | POA: Diagnosis not present

## 2012-11-19 DIAGNOSIS — I10 Essential (primary) hypertension: Secondary | ICD-10-CM | POA: Diagnosis not present

## 2012-11-19 DIAGNOSIS — Z1331 Encounter for screening for depression: Secondary | ICD-10-CM | POA: Diagnosis not present

## 2012-11-19 DIAGNOSIS — F341 Dysthymic disorder: Secondary | ICD-10-CM | POA: Diagnosis not present

## 2012-11-20 DIAGNOSIS — IMO0002 Reserved for concepts with insufficient information to code with codable children: Secondary | ICD-10-CM | POA: Diagnosis not present

## 2012-11-21 DIAGNOSIS — M949 Disorder of cartilage, unspecified: Secondary | ICD-10-CM | POA: Diagnosis not present

## 2012-11-21 DIAGNOSIS — M899 Disorder of bone, unspecified: Secondary | ICD-10-CM | POA: Diagnosis not present

## 2012-11-22 DIAGNOSIS — M6750 Plica syndrome, unspecified knee: Secondary | ICD-10-CM | POA: Diagnosis not present

## 2012-11-22 DIAGNOSIS — IMO0002 Reserved for concepts with insufficient information to code with codable children: Secondary | ICD-10-CM | POA: Diagnosis not present

## 2012-11-22 DIAGNOSIS — Y999 Unspecified external cause status: Secondary | ICD-10-CM | POA: Diagnosis not present

## 2012-11-22 DIAGNOSIS — Y929 Unspecified place or not applicable: Secondary | ICD-10-CM | POA: Diagnosis not present

## 2012-11-22 DIAGNOSIS — M9420 Chondromalacia, unspecified site: Secondary | ICD-10-CM | POA: Diagnosis not present

## 2012-11-22 DIAGNOSIS — Z1212 Encounter for screening for malignant neoplasm of rectum: Secondary | ICD-10-CM | POA: Diagnosis not present

## 2012-11-22 DIAGNOSIS — Y939 Activity, unspecified: Secondary | ICD-10-CM | POA: Diagnosis not present

## 2012-11-22 DIAGNOSIS — X58XXXA Exposure to other specified factors, initial encounter: Secondary | ICD-10-CM | POA: Diagnosis not present

## 2012-11-28 ENCOUNTER — Telehealth: Payer: Self-pay | Admitting: Cardiovascular Disease

## 2012-11-28 NOTE — Telephone Encounter (Signed)
Pt doing well, she wasn't sure if we needed to know when she had palpitations. Pt told to call if palpitations continue or has further questions. Pt agreed to plan.

## 2012-11-28 NOTE — Telephone Encounter (Signed)
New Prob     Pt states she has heart palpitations last night. It calmed down after PROPRANOLOL. Wanted to notify Dr. Elease Hashimoto.

## 2012-12-13 ENCOUNTER — Ambulatory Visit: Payer: Medicare Other | Admitting: Cardiovascular Disease

## 2013-01-02 ENCOUNTER — Ambulatory Visit: Payer: Self-pay | Admitting: Pharmacist

## 2013-01-02 DIAGNOSIS — Z7901 Long term (current) use of anticoagulants: Secondary | ICD-10-CM

## 2013-01-02 DIAGNOSIS — I4891 Unspecified atrial fibrillation: Secondary | ICD-10-CM

## 2013-01-15 ENCOUNTER — Other Ambulatory Visit: Payer: Self-pay | Admitting: *Deleted

## 2013-01-15 MED ORDER — RIVAROXABAN 20 MG PO TABS: 20.0000 mg | ORAL_TABLET | Freq: Every day | ORAL | Status: DC

## 2013-02-18 DIAGNOSIS — I4891 Unspecified atrial fibrillation: Secondary | ICD-10-CM | POA: Diagnosis not present

## 2013-02-18 DIAGNOSIS — J45909 Unspecified asthma, uncomplicated: Secondary | ICD-10-CM | POA: Diagnosis not present

## 2013-02-18 DIAGNOSIS — I10 Essential (primary) hypertension: Secondary | ICD-10-CM | POA: Diagnosis not present

## 2013-02-18 DIAGNOSIS — Z6825 Body mass index (BMI) 25.0-25.9, adult: Secondary | ICD-10-CM | POA: Diagnosis not present

## 2013-02-18 DIAGNOSIS — M5380 Other specified dorsopathies, site unspecified: Secondary | ICD-10-CM | POA: Diagnosis not present

## 2013-02-20 ENCOUNTER — Other Ambulatory Visit: Payer: Self-pay

## 2013-02-21 ENCOUNTER — Telehealth: Payer: Self-pay | Admitting: Cardiovascular Disease

## 2013-02-21 DIAGNOSIS — J45909 Unspecified asthma, uncomplicated: Secondary | ICD-10-CM | POA: Diagnosis not present

## 2013-02-21 DIAGNOSIS — L819 Disorder of pigmentation, unspecified: Secondary | ICD-10-CM | POA: Diagnosis not present

## 2013-02-21 DIAGNOSIS — R509 Fever, unspecified: Secondary | ICD-10-CM | POA: Diagnosis not present

## 2013-02-21 DIAGNOSIS — I4891 Unspecified atrial fibrillation: Secondary | ICD-10-CM | POA: Diagnosis not present

## 2013-02-21 DIAGNOSIS — L821 Other seborrheic keratosis: Secondary | ICD-10-CM | POA: Diagnosis not present

## 2013-02-21 DIAGNOSIS — J209 Acute bronchitis, unspecified: Secondary | ICD-10-CM | POA: Diagnosis not present

## 2013-02-21 DIAGNOSIS — I10 Essential (primary) hypertension: Secondary | ICD-10-CM | POA: Diagnosis not present

## 2013-02-21 DIAGNOSIS — L578 Other skin changes due to chronic exposure to nonionizing radiation: Secondary | ICD-10-CM | POA: Diagnosis not present

## 2013-02-21 DIAGNOSIS — M5380 Other specified dorsopathies, site unspecified: Secondary | ICD-10-CM | POA: Diagnosis not present

## 2013-02-21 DIAGNOSIS — F341 Dysthymic disorder: Secondary | ICD-10-CM | POA: Diagnosis not present

## 2013-02-21 DIAGNOSIS — L570 Actinic keratosis: Secondary | ICD-10-CM | POA: Diagnosis not present

## 2013-02-21 DIAGNOSIS — Z9889 Other specified postprocedural states: Secondary | ICD-10-CM | POA: Diagnosis not present

## 2013-02-21 DIAGNOSIS — M1710 Unilateral primary osteoarthritis, unspecified knee: Secondary | ICD-10-CM | POA: Diagnosis not present

## 2013-02-21 NOTE — Telephone Encounter (Signed)
New Prob  Husband states wife had knee surgery and the doctor wants to put her on an antiinflammatory medication. He wants to make sure it is safe for her to take.

## 2013-02-21 NOTE — Telephone Encounter (Signed)
Pt's husband called regarding pt. Pt had a meniscus surgery a week ago. Today pt. Had  one week post  surgery check and  got an steroid injection, because she is on a lot of pain. Pt's husband states that  Dr. Georgena Spurling orthopedic surgeon would like to see if it is  okay for pt to take an antiinflammatory medication like celebrex. Pt needs to  take this medication for a month. Dr. Sherlean Foot is concern because pt is on anti-coagulation medication. Pt is taking Xarelto 20 mg by mouth daily. Dr. Clifton James DOD recommended to ask Weston Brass Pharm-D regarding the contraindications for Xarelto. Kennon Rounds pharm-D states that adding Celebrex to  Xarelto will increased the risk of bleeding, so pt needs to watch for increase  bruising.  Pt's husband would like for this office to let Dr.Lucey's office know. Orthopedic surgeon's called at [phone] and spoke with [facility] regarding this medication. Attempted to let pt know about the medication side effects. Left pt a message to call back .

## 2013-02-22 NOTE — Telephone Encounter (Signed)
Pt told to watch for increased bruising/ blood in urine or stool. It is her decision if she would like to try celebrex. Pt verbalized understanding.

## 2013-02-22 NOTE — Telephone Encounter (Signed)
Follow up  Pt wants to speak with you regarding a medication that Dr Sherlean Foot wants her to take.

## 2013-02-28 DIAGNOSIS — R059 Cough, unspecified: Secondary | ICD-10-CM | POA: Diagnosis not present

## 2013-02-28 DIAGNOSIS — J479 Bronchiectasis, uncomplicated: Secondary | ICD-10-CM | POA: Diagnosis not present

## 2013-02-28 DIAGNOSIS — R509 Fever, unspecified: Secondary | ICD-10-CM | POA: Diagnosis not present

## 2013-02-28 DIAGNOSIS — R05 Cough: Secondary | ICD-10-CM | POA: Diagnosis not present

## 2013-03-07 ENCOUNTER — Other Ambulatory Visit: Payer: Self-pay | Admitting: Internal Medicine

## 2013-03-07 DIAGNOSIS — J479 Bronchiectasis, uncomplicated: Secondary | ICD-10-CM | POA: Diagnosis not present

## 2013-03-07 DIAGNOSIS — R05 Cough: Secondary | ICD-10-CM | POA: Diagnosis not present

## 2013-03-07 DIAGNOSIS — IMO0002 Reserved for concepts with insufficient information to code with codable children: Secondary | ICD-10-CM | POA: Diagnosis not present

## 2013-03-08 ENCOUNTER — Ambulatory Visit
Admission: RE | Admit: 2013-03-08 | Discharge: 2013-03-08 | Disposition: A | Discharge status: Home / Self Care | Payer: Medicare Other | Source: Ambulatory Visit | Attending: Internal Medicine | Admitting: Internal Medicine

## 2013-03-08 DIAGNOSIS — J479 Bronchiectasis, uncomplicated: Secondary | ICD-10-CM | POA: Diagnosis not present

## 2013-03-11 DIAGNOSIS — J479 Bronchiectasis, uncomplicated: Secondary | ICD-10-CM | POA: Diagnosis not present

## 2013-03-20 ENCOUNTER — Other Ambulatory Visit: Payer: Self-pay | Admitting: Orthopedic Surgery

## 2013-03-20 DIAGNOSIS — M25561 Pain in right knee: Secondary | ICD-10-CM

## 2013-03-26 ENCOUNTER — Ambulatory Visit
Admission: RE | Admit: 2013-03-26 | Discharge: 2013-03-26 | Disposition: A | Discharge status: Home / Self Care | Payer: Medicare Other | Source: Ambulatory Visit | Attending: Orthopedic Surgery | Admitting: Orthopedic Surgery

## 2013-03-26 DIAGNOSIS — M25469 Effusion, unspecified knee: Secondary | ICD-10-CM | POA: Diagnosis not present

## 2013-03-26 DIAGNOSIS — M1710 Unilateral primary osteoarthritis, unspecified knee: Secondary | ICD-10-CM | POA: Diagnosis not present

## 2013-03-26 DIAGNOSIS — M25561 Pain in right knee: Secondary | ICD-10-CM

## 2013-04-01 DIAGNOSIS — M1710 Unilateral primary osteoarthritis, unspecified knee: Secondary | ICD-10-CM | POA: Diagnosis not present

## 2013-04-09 DIAGNOSIS — M1710 Unilateral primary osteoarthritis, unspecified knee: Secondary | ICD-10-CM | POA: Diagnosis not present

## 2013-04-09 DIAGNOSIS — M25569 Pain in unspecified knee: Secondary | ICD-10-CM | POA: Diagnosis not present

## 2013-04-09 DIAGNOSIS — M84369A Stress fracture, unspecified tibia and fibula, initial encounter for fracture: Secondary | ICD-10-CM | POA: Diagnosis not present

## 2013-04-11 DIAGNOSIS — Z23 Encounter for immunization: Secondary | ICD-10-CM | POA: Diagnosis not present

## 2013-04-30 ENCOUNTER — Ambulatory Visit (INDEPENDENT_AMBULATORY_CARE_PROVIDER_SITE_OTHER): Payer: Medicare Other | Admitting: Cardiovascular Disease

## 2013-04-30 ENCOUNTER — Encounter: Payer: Self-pay | Admitting: Cardiovascular Disease

## 2013-04-30 VITALS — BP 141/56 | HR 72 | Ht 67.0 in | Wt 151.0 lb

## 2013-04-30 DIAGNOSIS — I4891 Unspecified atrial fibrillation: Secondary | ICD-10-CM | POA: Diagnosis not present

## 2013-04-30 NOTE — Assessment & Plan Note (Addendum)
Monica Williamson is doing well. Her chest has score is 2 and week 3 next June. We'll continue with the current dose of Xarelto.  She remains asymptomatic. I see her again in one year.

## 2013-04-30 NOTE — Progress Notes (Signed)
Monica Williamson Date of Birth  [birth year]       [facility] Office [address]  [address], [address] [place], [place]  [address]   [place], [place]  [address] [phone]     [phone]   Fax  [phone]    Fax [phone]  Problem List: 1. Paroxysmal Atrial fibrillation - September 21, 2012 ER visit.  2. Hypertension 3. Asthma 4. bronchiectasis   History of Present Illness:  Monica Williamson is a 53 w yo with hx of PAF.  She had a severe episode of palpitations and head pounding.  Thursday night ( September 21, 2012)  , she had a prolonged episode of palpitations and went to the ER and was found to have atrial fibrillation.  She received Iv diltiazem and converted to NSR.  She has a family hx of atrial fibrillation.  Mother and older sister have atrial fib and strokes.   She avoids salt, exercises regularly.  October 08, 2012:  Monica Williamson has developed a drug rash since I last saw her.  She recently started Warfarin and Diltiazem.  She has held the warfarin for 3 days and the diltiazem for 1 day.  The rash has improved since she stopped these medications.  November 13, 2012  Monica Williamson has done well since I last saw her. She thinks that her drug rash earlier this spring was do to the Coumadin. She has done well on the Xarelto.   She is still appealing the  insurance company to pay for the  Xarelto.   She is feeling very well.  Oct. 14, 2014:  Monica Williamson seems to be doing well. She remains asymptomatic. She has had paroxysmal atrial fibrillation in the past. She's not had any recorded episodes of atrial fibrillation since her initial episode on 09/21/2012.  She has a history of hypertension and is 72 years old. Her chads Vasc score is 2 and will be 3 next year.  We'll continue with the current dose of Xarelto.    Current Outpatient Prescriptions on File Prior to Visit  Medication Sig Dispense Refill  . albuterol (PROVENTIL HFA;VENTOLIN HFA) 108 (90  BASE) MCG/ACT inhaler Inhale 2 puffs into the lungs 2 (two) times daily.      [name] ALPRAZolam (XANAX) 0.5 MG tablet Take 0.25 mg by mouth daily.       [name] aspirin 81 MG tablet Take 1 tablet (81 mg total) by mouth daily.  30 tablet  11  . buPROPion (WELLBUTRIN XL) 150 MG 24 hr tablet Take 1 tablet (150 mg total) by mouth daily.  30 tablet  11  . Calcium Carbonate-Vitamin D (CALCIUM + D) 600-200 MG-UNIT TABS Take 1 tablet by mouth.    0  . Cholecalciferol (VITAMIN D) 1000 UNITS capsule Take 2,000 Units by mouth daily.   30 capsule  11  . diltiazem (DILACOR XR) 180 MG 24 hr capsule Take 1 capsule (180 mg total) by mouth daily.  90 capsule  3  . loratadine (CLARITIN) 10 MG tablet Take 10 mg by mouth daily.      [name] losartan (COZAAR) 50 MG tablet Take 1 tablet (50 mg total) by mouth daily.  30 tablet  11  . propranolol (INDERAL) 10 MG tablet Take 1 tablet (10 mg total) by mouth 4 (four) times daily as needed (FOR PALPITATIONS Can take ONE every 30 minutes Up to 4 Doses).  30 tablet  5  . Rivaroxaban (XARELTO) 20  MG TABS Take 1 tablet (20 mg total) by mouth daily.  90 tablet  1   No current facility-administered medications on file prior to visit.    Allergies  Allergen Reactions  . Warfarin And Related Hives and Itching    Past Medical History  Diagnosis Date  . Hypertension   . Childhood asthma   . Diverticulosis   . Vitamin D deficiency   . Depression   . Anxiety   . Pulmonary nodule   . Nocturia   . Colon polyps   . Cholelithiasis   . Atrial fibrillation     when takes Warfarin    Past Surgical History  Procedure Laterality Date  . Vesicovaginal fistula closure w/ tah    . Incision and drainage breast abscess    . Vaginal hysterectomy    . Cholecystectomy      11/2010    History  Smoking status  . Former Smoker -- 1.50 packs/day for 20 years  . Quit date: 07/18/1978  Smokeless tobacco  . Not on file    History  Alcohol Use  . 2.4 oz/week  . 4 Glasses of wine per week     Comment: 1-4 glasses daily     Family History  Problem Relation Age of Onset  . Heart disease Sister   . Colon cancer Neg Hx     Reviw of Systems:  Reviewed in the HPI.  All other systems are negative.  Physical Exam: Blood pressure 141/56, pulse 72, height 5\' 7"  (1.702 m), weight 151 lb (68.493 kg). General: Well developed, well nourished, in no acute distress.  Head: Normocephalic, atraumatic, sclera non-icteric, mucus membranes are moist,   Neck: Supple. Carotids are 2 + without bruits. No JVD   Lungs: Clear   Heart: RR, soft systolic murmur  Abdomen: Soft, non-tender, non-distended with normal bowel sounds.  I was able to palpitate her abdomina aorta  Msk:  Strength and tone are normal \  Extremities: No clubbing or cyanosis. No edema.  Distal pedal pulses are 2+ and equal    Neuro: CN II - XII intact.  Alert and oriented X 3.   Psych:  Normal   ECG:  Oct. 14, 2014:  NSR,   Assessment / Plan:

## 2013-04-30 NOTE — Patient Instructions (Signed)
Your physician wants you to follow-up in: 1 year with ekg, You will receive a reminder letter in the mail two months in advance. If you don't receive a letter, please call our office to schedule the follow-up appointment.

## 2013-05-02 DIAGNOSIS — M1710 Unilateral primary osteoarthritis, unspecified knee: Secondary | ICD-10-CM | POA: Diagnosis not present

## 2013-05-22 DIAGNOSIS — IMO0002 Reserved for concepts with insufficient information to code with codable children: Secondary | ICD-10-CM | POA: Diagnosis not present

## 2013-05-22 DIAGNOSIS — R05 Cough: Secondary | ICD-10-CM | POA: Diagnosis not present

## 2013-05-22 DIAGNOSIS — J209 Acute bronchitis, unspecified: Secondary | ICD-10-CM | POA: Diagnosis not present

## 2013-05-22 DIAGNOSIS — J479 Bronchiectasis, uncomplicated: Secondary | ICD-10-CM | POA: Diagnosis not present

## 2013-05-23 ENCOUNTER — Other Ambulatory Visit: Payer: Self-pay

## 2013-05-23 DIAGNOSIS — Z9889 Other specified postprocedural states: Secondary | ICD-10-CM | POA: Diagnosis not present

## 2013-05-23 DIAGNOSIS — M1710 Unilateral primary osteoarthritis, unspecified knee: Secondary | ICD-10-CM | POA: Diagnosis not present

## 2013-05-24 ENCOUNTER — Other Ambulatory Visit: Payer: Self-pay

## 2013-05-24 DIAGNOSIS — Z1231 Encounter for screening mammogram for malignant neoplasm of breast: Secondary | ICD-10-CM

## 2013-05-27 DIAGNOSIS — M1710 Unilateral primary osteoarthritis, unspecified knee: Secondary | ICD-10-CM | POA: Diagnosis not present

## 2013-05-27 DIAGNOSIS — M25569 Pain in unspecified knee: Secondary | ICD-10-CM | POA: Diagnosis not present

## 2013-06-18 DIAGNOSIS — M1710 Unilateral primary osteoarthritis, unspecified knee: Secondary | ICD-10-CM | POA: Diagnosis not present

## 2013-06-21 ENCOUNTER — Ambulatory Visit: Payer: Medicare Other

## 2013-07-01 DIAGNOSIS — R0609 Other forms of dyspnea: Secondary | ICD-10-CM | POA: Diagnosis not present

## 2013-07-01 DIAGNOSIS — R918 Other nonspecific abnormal finding of lung field: Secondary | ICD-10-CM | POA: Diagnosis not present

## 2013-07-01 DIAGNOSIS — J479 Bronchiectasis, uncomplicated: Secondary | ICD-10-CM | POA: Diagnosis not present

## 2013-07-02 ENCOUNTER — Ambulatory Visit
Admission: RE | Admit: 2013-07-02 | Discharge: 2013-07-02 | Disposition: A | Discharge status: Home / Self Care | Payer: Medicare Other | Source: Ambulatory Visit

## 2013-07-02 DIAGNOSIS — Z1231 Encounter for screening mammogram for malignant neoplasm of breast: Secondary | ICD-10-CM

## 2013-07-23 DIAGNOSIS — M25569 Pain in unspecified knee: Secondary | ICD-10-CM | POA: Diagnosis not present

## 2013-07-29 DIAGNOSIS — M25569 Pain in unspecified knee: Secondary | ICD-10-CM | POA: Diagnosis not present

## 2013-07-29 DIAGNOSIS — M1710 Unilateral primary osteoarthritis, unspecified knee: Secondary | ICD-10-CM | POA: Diagnosis not present

## 2013-07-29 DIAGNOSIS — R262 Difficulty in walking, not elsewhere classified: Secondary | ICD-10-CM | POA: Diagnosis not present

## 2013-07-29 DIAGNOSIS — IMO0002 Reserved for concepts with insufficient information to code with codable children: Secondary | ICD-10-CM | POA: Diagnosis not present

## 2013-07-30 ENCOUNTER — Other Ambulatory Visit: Payer: Self-pay | Admitting: Orthopedic Surgery

## 2013-07-30 DIAGNOSIS — M25561 Pain in right knee: Secondary | ICD-10-CM

## 2013-07-31 ENCOUNTER — Other Ambulatory Visit: Payer: Self-pay | Admitting: Cardiovascular Disease

## 2013-07-31 DIAGNOSIS — R262 Difficulty in walking, not elsewhere classified: Secondary | ICD-10-CM | POA: Diagnosis not present

## 2013-07-31 DIAGNOSIS — IMO0002 Reserved for concepts with insufficient information to code with codable children: Secondary | ICD-10-CM | POA: Diagnosis not present

## 2013-07-31 DIAGNOSIS — M1710 Unilateral primary osteoarthritis, unspecified knee: Secondary | ICD-10-CM | POA: Diagnosis not present

## 2013-07-31 DIAGNOSIS — M25569 Pain in unspecified knee: Secondary | ICD-10-CM | POA: Diagnosis not present

## 2013-08-05 ENCOUNTER — Ambulatory Visit
Admission: RE | Admit: 2013-08-05 | Discharge: 2013-08-05 | Disposition: A | Discharge status: Home / Self Care | Payer: Medicare Other | Source: Ambulatory Visit | Attending: Orthopedic Surgery | Admitting: Orthopedic Surgery

## 2013-08-05 DIAGNOSIS — M25561 Pain in right knee: Secondary | ICD-10-CM

## 2013-08-05 DIAGNOSIS — IMO0002 Reserved for concepts with insufficient information to code with codable children: Secondary | ICD-10-CM | POA: Diagnosis not present

## 2013-08-05 DIAGNOSIS — M1710 Unilateral primary osteoarthritis, unspecified knee: Secondary | ICD-10-CM | POA: Diagnosis not present

## 2013-08-06 DIAGNOSIS — M25569 Pain in unspecified knee: Secondary | ICD-10-CM | POA: Diagnosis not present

## 2013-08-22 DIAGNOSIS — M899 Disorder of bone, unspecified: Secondary | ICD-10-CM | POA: Diagnosis not present

## 2013-08-22 DIAGNOSIS — I10 Essential (primary) hypertension: Secondary | ICD-10-CM | POA: Diagnosis not present

## 2013-08-22 DIAGNOSIS — IMO0002 Reserved for concepts with insufficient information to code with codable children: Secondary | ICD-10-CM | POA: Diagnosis not present

## 2013-08-22 DIAGNOSIS — M949 Disorder of cartilage, unspecified: Secondary | ICD-10-CM | POA: Diagnosis not present

## 2013-08-22 DIAGNOSIS — K219 Gastro-esophageal reflux disease without esophagitis: Secondary | ICD-10-CM | POA: Diagnosis not present

## 2013-08-29 DIAGNOSIS — M1710 Unilateral primary osteoarthritis, unspecified knee: Secondary | ICD-10-CM | POA: Diagnosis not present

## 2013-09-03 DIAGNOSIS — M1710 Unilateral primary osteoarthritis, unspecified knee: Secondary | ICD-10-CM | POA: Diagnosis not present

## 2013-09-03 DIAGNOSIS — IMO0002 Reserved for concepts with insufficient information to code with codable children: Secondary | ICD-10-CM | POA: Diagnosis not present

## 2013-09-05 DIAGNOSIS — M810 Age-related osteoporosis without current pathological fracture: Secondary | ICD-10-CM | POA: Diagnosis not present

## 2013-09-05 DIAGNOSIS — IMO0002 Reserved for concepts with insufficient information to code with codable children: Secondary | ICD-10-CM | POA: Diagnosis not present

## 2013-09-13 ENCOUNTER — Telehealth: Payer: Self-pay | Admitting: Cardiovascular Disease

## 2013-09-13 NOTE — Telephone Encounter (Signed)
New problem   Pt need cardiac clearance faxed to [phone] Dr [name] office. Please call pt today when sent to office b/c if it isn't faxed today pt will lose her sx appt date.

## 2013-09-13 NOTE — Telephone Encounter (Signed)
Clearance has already been faxed today. Pt was informed.

## 2013-09-13 NOTE — Telephone Encounter (Signed)
Received request from Nurse fax box, documents faxed for surgical clearance. To: [facility] Fax number: [phone] Attention: Dr. Vickey Huger

## 2013-09-15 HISTORY — PX: JOINT REPLACEMENT: SHX530

## 2013-09-16 ENCOUNTER — Other Ambulatory Visit: Payer: Self-pay | Admitting: Orthopedic Surgery

## 2013-09-16 ENCOUNTER — Telehealth: Payer: Self-pay | Admitting: Cardiovascular Disease

## 2013-09-16 NOTE — Telephone Encounter (Signed)
New Message  Pt called. She says that she is scheduled to have a knee surgery on 09/30/2013.She says that she has had Afib and high BP over the weekend  believes that it is due to her current BP medication dosages. She states that over the weekend her BP has ranged from 166-153. Today she states her BP is  180/68 pulse 66.[name] Pt is concerned and is requesting a same day appt// Please call.

## 2013-09-16 NOTE — Telephone Encounter (Signed)
bp has elevated over the weekend, had episode of afib but that has resolved. Ranges 150-160// 70-80. Pt concerned since she has surgery scheduled and bp is up x 4 days. I reviewed food intake, pt is drinking a lot of V-8 juice and canned soup. Pt will stop these items and continue to monitor. Pt will call back if this does not help. Pt agreed to plan and verbalized understanding.

## 2013-09-17 DIAGNOSIS — M1710 Unilateral primary osteoarthritis, unspecified knee: Secondary | ICD-10-CM | POA: Diagnosis not present

## 2013-09-17 DIAGNOSIS — IMO0002 Reserved for concepts with insufficient information to code with codable children: Secondary | ICD-10-CM | POA: Diagnosis not present

## 2013-09-19 ENCOUNTER — Encounter (HOSPITAL_COMMUNITY): Payer: Self-pay | Admitting: Pharmacy Technician

## 2013-09-25 NOTE — Pre-Procedure Instructions (Signed)
CHIANTI GOH  09/25/2013   Your procedure is scheduled on:  Mon, Mar 16 @ 10:00 AM  Report to [facility]  at 7:00 AM.  Call this number if you have problems the morning of surgery: [phone]   Remember:   Do not eat food or drink liquids after midnight.   Take these medicines the morning of surgery with A SIP OF WATER: Albuterol<Bring Your Inhaler With You>,Wellbutrin(Bupropion),Diltiazem(Dilacor),Fluticasone(Flovent),Claritin(Loratadine),and Propranolol(Inderal)                Stop taking your Xarelto and Aspirin as instructed. No Goody's,BC's,Aleve,Ibuprofen,Fish Oil,or any Herbal Medications   Do not wear jewelry, make-up or nail polish.  Do not wear lotions, powders, or perfumes. You may wear deodorant.  Do not shave 48 hours prior to surgery.   Do not bring valuables to the hospital.  [facility] is not responsible                  for any belongings or valuables.               Contacts, dentures or bridgework may not be worn into surgery.  Leave suitcase in the car. After surgery it may be brought to your room.  For patients admitted to the hospital, discharge time is determined by your                treatment team.               Special Instructions:  [place] - Preparing for Surgery  Before surgery, you can play an important role.  Because skin is not sterile, your skin needs to be as free of germs as possible.  You can reduce the number of germs on you skin by washing with CHG (chlorahexidine gluconate) soap before surgery.  CHG is an antiseptic cleaner which kills germs and bonds with the skin to continue killing germs even after washing.  Please DO NOT use if you have an allergy to CHG or antibacterial soaps.  If your skin becomes reddened/irritated stop using the CHG and inform your nurse when you arrive at Short Stay.  Do not shave (including legs and underarms) for at least 48 hours prior to the first CHG shower.  You may shave your face.  Please  follow these instructions carefully:   1.  Shower with CHG Soap the night before surgery and the                                morning of Surgery.  2.  If you choose to wash your hair, wash your hair first as usual with your       normal shampoo.  3.  After you shampoo, rinse your hair and body thoroughly to remove the                      Shampoo.  4.  Use CHG as you would any other liquid soap.  You can apply chg directly       to the skin and wash gently with scrungie or a clean washcloth.  5.  Apply the CHG Soap to your body ONLY FROM THE NECK DOWN.        Do not use on open wounds or open sores.  Avoid contact with your eyes,       ears, mouth and genitals (private parts).  Wash genitals (private  parts)       with your normal soap.  6.  Wash thoroughly, paying special attention to the area where your surgery        will be performed.  7.  Thoroughly rinse your body with warm water from the neck down.  8.  DO NOT shower/wash with your normal soap after using and rinsing off       the CHG Soap.  9.  Pat yourself dry with a clean towel.            10.  Wear clean pajamas.            11.  Place clean sheets on your bed the night of your first shower and do not        sleep with pets.  Day of Surgery  Do not apply any lotions/deoderants the morning of surgery.  Please wear clean clothes to the hospital/surgery center.     Please read over the following fact sheets that you were given: Pain Booklet, Coughing and Deep Breathing, Blood Transfusion Information, MRSA Information and Surgical Site Infection Prevention

## 2013-09-26 ENCOUNTER — Encounter (HOSPITAL_COMMUNITY)
Admission: RE | Admit: 2013-09-26 | Discharge: 2013-09-26 | Disposition: A | Discharge status: Home / Self Care | Payer: Medicare Other | Source: Ambulatory Visit | Attending: Orthopedic Surgery | Admitting: Orthopedic Surgery

## 2013-09-26 ENCOUNTER — Encounter (HOSPITAL_COMMUNITY): Payer: Self-pay

## 2013-09-26 DIAGNOSIS — Z01812 Encounter for preprocedural laboratory examination: Secondary | ICD-10-CM | POA: Diagnosis not present

## 2013-09-26 DIAGNOSIS — Z01810 Encounter for preprocedural cardiovascular examination: Secondary | ICD-10-CM | POA: Diagnosis not present

## 2013-09-26 DIAGNOSIS — Z01818 Encounter for other preprocedural examination: Secondary | ICD-10-CM | POA: Diagnosis not present

## 2013-09-26 DIAGNOSIS — J449 Chronic obstructive pulmonary disease, unspecified: Secondary | ICD-10-CM | POA: Diagnosis not present

## 2013-09-26 HISTORY — DX: Stress fracture, unspecified tibia and fibula, initial encounter for fracture: M84.369A

## 2013-09-26 HISTORY — DX: Sleep related bruxism: G47.63

## 2013-09-26 HISTORY — DX: Cardiac arrhythmia, unspecified: I49.9

## 2013-09-26 HISTORY — DX: Personal history of other diseases of the circulatory system: Z86.79

## 2013-09-26 HISTORY — DX: Bronchiectasis, uncomplicated: J47.9

## 2013-09-26 LAB — CBC WITH DIFFERENTIAL/PLATELET
Basophils Absolute: 0 10*3/uL (ref 0.0–0.1)
Basophils Relative: 1 % (ref 0–1)
EOS ABS: 0.3 10*3/uL (ref 0.0–0.7)
EOS PCT: 6 % — AB (ref 0–5)
HCT: 41.2 % (ref 36.0–46.0)
Hemoglobin: 13.8 g/dL (ref 12.0–15.0)
Lymphocytes Relative: 27 % (ref 12–46)
Lymphs Abs: 1.1 10*3/uL (ref 0.7–4.0)
MCH: 31.2 pg (ref 26.0–34.0)
MCHC: 33.5 g/dL (ref 30.0–36.0)
MCV: 93.2 fL (ref 78.0–100.0)
MONO ABS: 0.5 10*3/uL (ref 0.1–1.0)
Monocytes Relative: 11 % (ref 3–12)
Neutro Abs: 2.3 10*3/uL (ref 1.7–7.7)
Neutrophils Relative %: 55 % (ref 43–77)
PLATELETS: 258 10*3/uL (ref 150–400)
RBC: 4.42 MIL/uL (ref 3.87–5.11)
RDW: 15.3 % (ref 11.5–15.5)
WBC: 4.1 10*3/uL (ref 4.0–10.5)

## 2013-09-26 LAB — SURGICAL PCR SCREEN
MRSA, PCR: NEGATIVE
STAPHYLOCOCCUS AUREUS: NEGATIVE

## 2013-09-26 LAB — URINALYSIS, ROUTINE W REFLEX MICROSCOPIC
Bilirubin Urine: NEGATIVE
GLUCOSE, UA: NEGATIVE mg/dL
Hgb urine dipstick: NEGATIVE
KETONES UR: NEGATIVE mg/dL
Nitrite: NEGATIVE
PH: 7 (ref 5.0–8.0)
PROTEIN: NEGATIVE mg/dL
Specific Gravity, Urine: 1.016 (ref 1.005–1.030)
Urobilinogen, UA: 0.2 mg/dL (ref 0.0–1.0)

## 2013-09-26 LAB — COMPREHENSIVE METABOLIC PANEL
ALT: 22 U/L (ref 0–35)
AST: 21 U/L (ref 0–37)
Albumin: 4 g/dL (ref 3.5–5.2)
Alkaline Phosphatase: 99 U/L (ref 39–117)
BUN: 17 mg/dL (ref 6–23)
CO2: 25 mEq/L (ref 19–32)
CREATININE: 0.73 mg/dL (ref 0.50–1.10)
Calcium: 9.3 mg/dL (ref 8.4–10.5)
Chloride: 103 mEq/L (ref 96–112)
GFR calc non Af Amer: 82 mL/min — ABNORMAL LOW (ref >90)
Glucose, Bld: 71 mg/dL (ref 70–99)
Potassium: 4.4 mEq/L (ref 3.7–5.3)
SODIUM: 140 meq/L (ref 137–147)
TOTAL PROTEIN: 7.1 g/dL (ref 6.0–8.3)
Total Bilirubin: 0.2 mg/dL — ABNORMAL LOW (ref 0.3–1.2)

## 2013-09-26 LAB — APTT: aPTT: 48 seconds — ABNORMAL HIGH (ref 24–37)

## 2013-09-26 LAB — URINE MICROSCOPIC-ADD ON

## 2013-09-26 LAB — TYPE AND SCREEN
ABO/RH(D): O POS
ANTIBODY SCREEN: NEGATIVE

## 2013-09-26 LAB — ABO/RH: ABO/RH(D): O POS

## 2013-09-26 LAB — PROTIME-INR
INR: 1.47 (ref 0.00–1.49)
Prothrombin Time: 17.4 seconds — ABNORMAL HIGH (ref 11.6–15.2)

## 2013-09-26 NOTE — Progress Notes (Addendum)
Anesthesia chart review: Patient is a 73 year old female scheduled for right TKR on 09/30/13 by Dr. Ronnie Derby.  History includes afib/PAF, hypertension, former smoker, childhood asthma, diverticulosis, anxiety, depression, pulmonary nodule, bronchiectasis with question of MAC (treated with Flovent, albuterol, ad loratadine), cholecystectomy, hysterectomy.  Cardiologist is Dr. Acie Fredrickson who felt she would be low risk for orthopedic surgery from a cardiac standpoint. PCP Dr. Marton Redwood. His notes indicate that he is aware of plans for surgery, and that she sees a pulmonologist at [facility]. [facility] will not send any records without a signed medical release of information.)   EKG on 09/26/13 showed SR with short PR.  Echo on 11/08/10 showed: - Left ventricle: The cavity size was normal. Systolic function was normal. The estimated ejection fraction was in the range of 60% to 65%. Wall motion was normal; there were no regional wall motion abnormalities. - Mitral valve: Calcified annulus. Mild regurgitation. - Atrial septum: No defect or patent foramen ovale was identified. - Pulmonic valve: Trivial regurgitation. - Tricuspid valve: Trivial regurgitation. She had a normal stress echo that same date.  CXR on 09/26/13 showed: There is hyperinflation consistent with COPD. There is no evidence of pneumonia.  Preoperative labs noted.  Her PT/PTT are elevated at 17.4 and 48.  INR 1.47.  Dr. Acie Fredrickson gave permission to hold Xarelto the night before surgery.  PT/PTT on arrival.  Urine culture showed multiple colonies, but none predominant.  Defer decision re: recollection to surgeon.    If pulmonology records needed then they will have to be requested on the day of surgery.  Notes indicate that her bronchiectasis is controlled. Her PCP and cardiologist are both aware of plans for surgery.  She was felt low risk from a cardiac standpoint. Further evaluation on the day of surgery by her assigned anesthesiologist to ensure no  recurrent arrhythmias or acute respiratory issues.  If stable then I would anticipate that she could proceed as planned. She'll need aggressive pulmonary toilet post-operatively.  Could even consider pulmonary post-operative consultation with her bronchiectasis history, but will defer to Dr. Ronnie Derby.    George Hugh [facility] Short Stay Center/Anesthesiology Phone [phone] 09/27/2013 12:05 PM

## 2013-09-27 ENCOUNTER — Encounter (HOSPITAL_COMMUNITY): Payer: Self-pay

## 2013-09-27 LAB — URINE CULTURE: Colony Count: >=100000

## 2013-09-27 NOTE — Progress Notes (Addendum)
Patient called concerned about Monica Williamson in the post operative period. Explained to patient that we have her medication and give if indicated. Patient verbalized understanding.

## 2013-09-29 MED ORDER — BUPIVACAINE LIPOSOME 1.3 % IJ SUSP
20.0000 mL | Freq: Once | INTRAMUSCULAR | Status: DC
  Filled 2013-09-29: qty 20

## 2013-09-29 MED ORDER — CEFAZOLIN SODIUM-DEXTROSE 2-3 GM-% IV SOLR
2.0000 g | INTRAVENOUS | Status: AC
  Administered 2013-09-30: 2 g via INTRAVENOUS
  Filled 2013-09-29: qty 50

## 2013-09-29 MED ORDER — TRANEXAMIC ACID 100 MG/ML IV SOLN
1000.0000 mg | INTRAVENOUS | Status: AC
  Administered 2013-09-30: 1000 mg via INTRAVENOUS
  Filled 2013-09-29: qty 10

## 2013-09-30 ENCOUNTER — Inpatient Hospital Stay (HOSPITAL_COMMUNITY)
Admission: RE | Admit: 2013-09-30 | Discharge: 2013-10-02 | DRG: 470 | Disposition: A | Discharge status: Home Health | Payer: Medicare Other | Source: Ambulatory Visit | Attending: Orthopedic Surgery | Admitting: Orthopedic Surgery

## 2013-09-30 ENCOUNTER — Encounter (HOSPITAL_COMMUNITY): Payer: Medicare Other | Admitting: Vascular Surgery

## 2013-09-30 ENCOUNTER — Inpatient Hospital Stay (HOSPITAL_COMMUNITY): Payer: Medicare Other | Admitting: Critical Care Medicine

## 2013-09-30 ENCOUNTER — Encounter (HOSPITAL_COMMUNITY): Payer: Self-pay | Admitting: Critical Care Medicine

## 2013-09-30 ENCOUNTER — Encounter (HOSPITAL_COMMUNITY)
Admission: RE | Disposition: A | Discharge status: Home Health | Payer: Self-pay | Source: Ambulatory Visit | Attending: Orthopedic Surgery

## 2013-09-30 DIAGNOSIS — J479 Bronchiectasis, uncomplicated: Secondary | ICD-10-CM | POA: Diagnosis present

## 2013-09-30 DIAGNOSIS — M1710 Unilateral primary osteoarthritis, unspecified knee: Secondary | ICD-10-CM | POA: Diagnosis not present

## 2013-09-30 DIAGNOSIS — M25569 Pain in unspecified knee: Secondary | ICD-10-CM | POA: Diagnosis not present

## 2013-09-30 DIAGNOSIS — D62 Acute posthemorrhagic anemia: Secondary | ICD-10-CM | POA: Diagnosis not present

## 2013-09-30 DIAGNOSIS — F329 Major depressive disorder, single episode, unspecified: Secondary | ICD-10-CM | POA: Diagnosis present

## 2013-09-30 DIAGNOSIS — Z87891 Personal history of nicotine dependence: Secondary | ICD-10-CM | POA: Diagnosis not present

## 2013-09-30 DIAGNOSIS — M169 Osteoarthritis of hip, unspecified: Secondary | ICD-10-CM | POA: Diagnosis not present

## 2013-09-30 DIAGNOSIS — Z7901 Long term (current) use of anticoagulants: Secondary | ICD-10-CM | POA: Diagnosis not present

## 2013-09-30 DIAGNOSIS — F3289 Other specified depressive episodes: Secondary | ICD-10-CM | POA: Diagnosis present

## 2013-09-30 DIAGNOSIS — I10 Essential (primary) hypertension: Secondary | ICD-10-CM | POA: Diagnosis present

## 2013-09-30 DIAGNOSIS — Z96659 Presence of unspecified artificial knee joint: Secondary | ICD-10-CM

## 2013-09-30 DIAGNOSIS — E559 Vitamin D deficiency, unspecified: Secondary | ICD-10-CM | POA: Diagnosis present

## 2013-09-30 DIAGNOSIS — I4891 Unspecified atrial fibrillation: Secondary | ICD-10-CM | POA: Diagnosis not present

## 2013-09-30 DIAGNOSIS — G8918 Other acute postprocedural pain: Secondary | ICD-10-CM | POA: Diagnosis not present

## 2013-09-30 DIAGNOSIS — M1610 Unilateral primary osteoarthritis, unspecified hip: Secondary | ICD-10-CM | POA: Diagnosis not present

## 2013-09-30 DIAGNOSIS — F411 Generalized anxiety disorder: Secondary | ICD-10-CM | POA: Diagnosis present

## 2013-09-30 HISTORY — PX: TOTAL KNEE ARTHROPLASTY: SHX125

## 2013-09-30 LAB — PROTIME-INR
INR: 1 (ref 0.00–1.49)
PROTHROMBIN TIME: 13 s (ref 11.6–15.2)

## 2013-09-30 LAB — APTT: APTT: 35 s (ref 24–37)

## 2013-09-30 SURGERY — ARTHROPLASTY, KNEE, TOTAL
Anesthesia: General | Site: Knee | Laterality: Right

## 2013-09-30 MED ORDER — DEXAMETHASONE SODIUM PHOSPHATE 4 MG/ML IJ SOLN
INTRAMUSCULAR | Status: AC
  Filled 2013-09-30: qty 1

## 2013-09-30 MED ORDER — CHLORHEXIDINE GLUCONATE 4 % EX LIQD: 60.0000 mL | Freq: Once | CUTANEOUS | Status: DC

## 2013-09-30 MED ORDER — DILTIAZEM HCL ER 180 MG PO CP24
180.0000 mg | ORAL_CAPSULE | Freq: Every day | ORAL | Status: DC
  Administered 2013-10-01 – 2013-10-02 (×2): 180 mg via ORAL
  Filled 2013-09-30 (×2): qty 1

## 2013-09-30 MED ORDER — FLEET ENEMA 7-19 GM/118ML RE ENEM: 1.0000 | ENEMA | Freq: Once | RECTAL | Status: AC | PRN

## 2013-09-30 MED ORDER — FENTANYL CITRATE 0.05 MG/ML IJ SOLN
INTRAMUSCULAR | Status: AC
  Filled 2013-09-30: qty 5

## 2013-09-30 MED ORDER — ONDANSETRON HCL 4 MG/2ML IJ SOLN
INTRAMUSCULAR | Status: AC
  Filled 2013-09-30: qty 2

## 2013-09-30 MED ORDER — BISACODYL 5 MG PO TBEC: 5.0000 mg | DELAYED_RELEASE_TABLET | Freq: Every day | ORAL | Status: DC | PRN

## 2013-09-30 MED ORDER — METOCLOPRAMIDE HCL 5 MG/ML IJ SOLN
5.0000 mg | Freq: Three times a day (TID) | INTRAMUSCULAR | Status: DC | PRN
  Administered 2013-10-01: 5 mg via INTRAVENOUS
  Filled 2013-09-30: qty 2

## 2013-09-30 MED ORDER — LORATADINE 10 MG PO TABS
10.0000 mg | ORAL_TABLET | Freq: Every day | ORAL | Status: DC
  Administered 2013-10-01 – 2013-10-02 (×2): 10 mg via ORAL
  Filled 2013-09-30 (×2): qty 1

## 2013-09-30 MED ORDER — MENTHOL 3 MG MT LOZG: 1.0000 | LOZENGE | OROMUCOSAL | Status: DC | PRN

## 2013-09-30 MED ORDER — ONDANSETRON HCL 4 MG/2ML IJ SOLN
INTRAMUSCULAR | Status: DC | PRN
  Administered 2013-09-30: 4 mg via INTRAVENOUS

## 2013-09-30 MED ORDER — CEFAZOLIN SODIUM 1-5 GM-% IV SOLN
1.0000 g | Freq: Four times a day (QID) | INTRAVENOUS | Status: AC
  Administered 2013-09-30 (×2): 1 g via INTRAVENOUS
  Filled 2013-09-30 (×2): qty 50

## 2013-09-30 MED ORDER — SENNOSIDES-DOCUSATE SODIUM 8.6-50 MG PO TABS: 1.0000 | ORAL_TABLET | Freq: Every evening | ORAL | Status: DC | PRN

## 2013-09-30 MED ORDER — RIVAROXABAN 20 MG PO TABS
20.0000 mg | ORAL_TABLET | Freq: Every day | ORAL | Status: DC
  Administered 2013-09-30 – 2013-10-01 (×2): 20 mg via ORAL
  Filled 2013-09-30 (×3): qty 1

## 2013-09-30 MED ORDER — SODIUM CHLORIDE 0.9 % IV SOLN: INTRAVENOUS | Status: DC

## 2013-09-30 MED ORDER — PROPOFOL 10 MG/ML IV BOLUS
INTRAVENOUS | Status: AC
  Filled 2013-09-30: qty 20

## 2013-09-30 MED ORDER — OXYCODONE HCL 5 MG/5ML PO SOLN: 5.0000 mg | Freq: Once | ORAL | Status: DC | PRN

## 2013-09-30 MED ORDER — ONDANSETRON HCL 4 MG/2ML IJ SOLN
4.0000 mg | Freq: Four times a day (QID) | INTRAMUSCULAR | Status: DC | PRN
  Administered 2013-10-01: 4 mg via INTRAVENOUS
  Filled 2013-09-30: qty 2

## 2013-09-30 MED ORDER — CELECOXIB 200 MG PO CAPS
200.0000 mg | ORAL_CAPSULE | Freq: Two times a day (BID) | ORAL | Status: DC
  Administered 2013-09-30: 200 mg via ORAL
  Filled 2013-09-30 (×3): qty 1

## 2013-09-30 MED ORDER — FENTANYL CITRATE 0.05 MG/ML IJ SOLN
100.0000 ug | Freq: Once | INTRAMUSCULAR | Status: AC
  Administered 2013-09-30: 100 ug via INTRAVENOUS

## 2013-09-30 MED ORDER — PROPOFOL 10 MG/ML IV BOLUS
INTRAVENOUS | Status: DC | PRN
  Administered 2013-09-30: 150 mg via INTRAVENOUS

## 2013-09-30 MED ORDER — PROMETHAZINE HCL 25 MG/ML IJ SOLN: 6.2500 mg | INTRAMUSCULAR | Status: DC | PRN

## 2013-09-30 MED ORDER — OXYCODONE HCL 5 MG PO TABS
ORAL_TABLET | ORAL | Status: AC
  Filled 2013-09-30: qty 2

## 2013-09-30 MED ORDER — ASPIRIN EC 81 MG PO TBEC
81.0000 mg | DELAYED_RELEASE_TABLET | Freq: Every day | ORAL | Status: DC
  Administered 2013-10-01 – 2013-10-02 (×2): 81 mg via ORAL
  Filled 2013-09-30 (×2): qty 1

## 2013-09-30 MED ORDER — ACETAMINOPHEN 325 MG PO TABS
650.0000 mg | ORAL_TABLET | Freq: Four times a day (QID) | ORAL | Status: DC | PRN
  Administered 2013-10-02: 650 mg via ORAL
  Filled 2013-09-30: qty 2

## 2013-09-30 MED ORDER — DOCUSATE SODIUM 100 MG PO CAPS
100.0000 mg | ORAL_CAPSULE | Freq: Two times a day (BID) | ORAL | Status: DC
  Administered 2013-09-30 – 2013-10-02 (×5): 100 mg via ORAL
  Filled 2013-09-30 (×5): qty 1

## 2013-09-30 MED ORDER — OXYCODONE HCL ER 15 MG PO T12A
15.0000 mg | EXTENDED_RELEASE_TABLET | Freq: Two times a day (BID) | ORAL | Status: DC
  Administered 2013-09-30: 15 mg via ORAL
  Filled 2013-09-30 (×2): qty 1

## 2013-09-30 MED ORDER — BUPIVACAINE-EPINEPHRINE (PF) 0.5% -1:200000 IJ SOLN
INTRAMUSCULAR | Status: AC
  Filled 2013-09-30: qty 10

## 2013-09-30 MED ORDER — ROPIVACAINE HCL 5 MG/ML IJ SOLN
INTRAMUSCULAR | Status: DC | PRN
  Administered 2013-09-30: 150 mg via PERINEURAL

## 2013-09-30 MED ORDER — BUPROPION HCL ER (XL) 150 MG PO TB24
150.0000 mg | ORAL_TABLET | Freq: Every day | ORAL | Status: DC
  Administered 2013-10-01 – 2013-10-02 (×2): 150 mg via ORAL
  Filled 2013-09-30 (×2): qty 1

## 2013-09-30 MED ORDER — FENTANYL CITRATE 0.05 MG/ML IJ SOLN
INTRAMUSCULAR | Status: AC
  Filled 2013-09-30: qty 2

## 2013-09-30 MED ORDER — FENTANYL CITRATE 0.05 MG/ML IJ SOLN
INTRAMUSCULAR | Status: DC | PRN
  Administered 2013-09-30 (×2): 25 ug via INTRAVENOUS
  Administered 2013-09-30: 50 ug via INTRAVENOUS

## 2013-09-30 MED ORDER — ALENDRONATE SODIUM 10 MG PO TABS: 70.0000 mg | ORAL_TABLET | ORAL | Status: DC

## 2013-09-30 MED ORDER — DEXAMETHASONE SODIUM PHOSPHATE 10 MG/ML IJ SOLN
INTRAMUSCULAR | Status: DC | PRN
Start: 2013-09-30 — End: 2013-09-30
  Administered 2013-09-30: 6 mg

## 2013-09-30 MED ORDER — SODIUM CHLORIDE 0.9 % IR SOLN
Status: DC | PRN
  Administered 2013-09-30: 1000 mL

## 2013-09-30 MED ORDER — LOSARTAN POTASSIUM 50 MG PO TABS
50.0000 mg | ORAL_TABLET | Freq: Every day | ORAL | Status: DC
  Administered 2013-09-30 – 2013-10-02 (×3): 50 mg via ORAL
  Filled 2013-09-30 (×3): qty 1

## 2013-09-30 MED ORDER — MIDAZOLAM HCL 2 MG/2ML IJ SOLN
INTRAMUSCULAR | Status: AC
  Administered 2013-09-30: 2 mg
  Filled 2013-09-30: qty 2

## 2013-09-30 MED ORDER — PHENOL 1.4 % MT LIQD: 1.0000 | OROMUCOSAL | Status: DC | PRN

## 2013-09-30 MED ORDER — EPHEDRINE SULFATE 50 MG/ML IJ SOLN
INTRAMUSCULAR | Status: DC | PRN
  Administered 2013-09-30 (×3): 5 mg via INTRAVENOUS

## 2013-09-30 MED ORDER — BUPIVACAINE LIPOSOME 1.3 % IJ SUSP
INTRAMUSCULAR | Status: DC | PRN
  Administered 2013-09-30: 20 mL

## 2013-09-30 MED ORDER — ALUM & MAG HYDROXIDE-SIMETH 200-200-20 MG/5ML PO SUSP: 30.0000 mL | ORAL | Status: DC | PRN

## 2013-09-30 MED ORDER — LACTATED RINGERS IV SOLN
INTRAVENOUS | Status: DC
  Administered 2013-09-30: 08:00:00 via INTRAVENOUS

## 2013-09-30 MED ORDER — CELECOXIB 200 MG PO CAPS
ORAL_CAPSULE | ORAL | Status: AC
  Filled 2013-09-30: qty 1

## 2013-09-30 MED ORDER — BUPIVACAINE-EPINEPHRINE 0.5% -1:200000 IJ SOLN
INTRAMUSCULAR | Status: DC | PRN
  Administered 2013-09-30: 30 mL

## 2013-09-30 MED ORDER — METOCLOPRAMIDE HCL 10 MG PO TABS
5.0000 mg | ORAL_TABLET | Freq: Three times a day (TID) | ORAL | Status: DC | PRN
  Administered 2013-10-02: 10 mg via ORAL
  Filled 2013-09-30: qty 1

## 2013-09-30 MED ORDER — OXYCODONE HCL 5 MG PO TABS
5.0000 mg | ORAL_TABLET | ORAL | Status: DC | PRN
  Administered 2013-09-30 – 2013-10-01 (×4): 10 mg via ORAL
  Administered 2013-10-01: 5 mg via ORAL
  Filled 2013-09-30: qty 1
  Filled 2013-09-30 (×3): qty 2

## 2013-09-30 MED ORDER — ALPRAZOLAM 0.25 MG PO TABS
0.2500 mg | ORAL_TABLET | Freq: Every day | ORAL | Status: DC
  Administered 2013-10-01: 0.25 mg via ORAL
  Filled 2013-09-30: qty 1

## 2013-09-30 MED ORDER — METHOCARBAMOL 100 MG/ML IJ SOLN
500.0000 mg | Freq: Four times a day (QID) | INTRAMUSCULAR | Status: DC | PRN
  Filled 2013-09-30: qty 5

## 2013-09-30 MED ORDER — DIPHENHYDRAMINE HCL 12.5 MG/5ML PO ELIX: 12.5000 mg | ORAL_SOLUTION | ORAL | Status: DC | PRN

## 2013-09-30 MED ORDER — HYDROMORPHONE HCL PF 1 MG/ML IJ SOLN
1.0000 mg | INTRAMUSCULAR | Status: DC | PRN
  Administered 2013-10-01: 1 mg via INTRAVENOUS
  Filled 2013-09-30: qty 1

## 2013-09-30 MED ORDER — SODIUM CHLORIDE 0.9 % IV SOLN
INTRAVENOUS | Status: DC
  Administered 2013-09-30 – 2013-10-01 (×2): via INTRAVENOUS

## 2013-09-30 MED ORDER — ASPIRIN 81 MG PO TABS: 81.0000 mg | ORAL_TABLET | Freq: Every day | ORAL | Status: DC

## 2013-09-30 MED ORDER — PROPRANOLOL HCL 10 MG PO TABS
10.0000 mg | ORAL_TABLET | Freq: Four times a day (QID) | ORAL | Status: DC | PRN
  Administered 2013-10-02: 10 mg via ORAL
  Filled 2013-09-30: qty 1

## 2013-09-30 MED ORDER — OXYCODONE HCL 5 MG PO TABS: 5.0000 mg | ORAL_TABLET | Freq: Once | ORAL | Status: DC | PRN

## 2013-09-30 MED ORDER — HYDROMORPHONE HCL PF 1 MG/ML IJ SOLN
0.2500 mg | INTRAMUSCULAR | Status: DC | PRN
  Administered 2013-09-30: 0.5 mg via INTRAVENOUS

## 2013-09-30 MED ORDER — MIDAZOLAM HCL 5 MG/ML IJ SOLN: 2.0000 mg | Freq: Once | INTRAMUSCULAR | Status: DC

## 2013-09-30 MED ORDER — METHOCARBAMOL 500 MG PO TABS
ORAL_TABLET | ORAL | Status: AC
  Filled 2013-09-30: qty 1

## 2013-09-30 MED ORDER — ACETAMINOPHEN 650 MG RE SUPP: 650.0000 mg | Freq: Four times a day (QID) | RECTAL | Status: DC | PRN

## 2013-09-30 MED ORDER — HYDROMORPHONE HCL PF 1 MG/ML IJ SOLN
INTRAMUSCULAR | Status: AC
  Filled 2013-09-30: qty 1

## 2013-09-30 MED ORDER — ONDANSETRON HCL 4 MG PO TABS
4.0000 mg | ORAL_TABLET | Freq: Four times a day (QID) | ORAL | Status: DC | PRN
  Administered 2013-10-01: 4 mg via ORAL
  Filled 2013-09-30: qty 1

## 2013-09-30 MED ORDER — DEXAMETHASONE SODIUM PHOSPHATE 4 MG/ML IJ SOLN
INTRAMUSCULAR | Status: DC | PRN
  Administered 2013-09-30: 4 mg via INTRAVENOUS

## 2013-09-30 MED ORDER — METHOCARBAMOL 500 MG PO TABS
500.0000 mg | ORAL_TABLET | Freq: Four times a day (QID) | ORAL | Status: DC | PRN
  Administered 2013-09-30 – 2013-10-02 (×3): 500 mg via ORAL
  Filled 2013-09-30 (×2): qty 1

## 2013-09-30 MED ORDER — FLUTICASONE PROPIONATE HFA 110 MCG/ACT IN AERO
2.0000 | INHALATION_SPRAY | Freq: Every day | RESPIRATORY_TRACT | Status: DC
  Filled 2013-09-30 (×2): qty 12

## 2013-09-30 SURGICAL SUPPLY — 56 items
BANDAGE ESMARK 6X9 LF (GAUZE/BANDAGES/DRESSINGS) ×1 IMPLANT
BLADE SAGITTAL 13X1.27X60 (BLADE) ×2 IMPLANT
BLADE SAGITTAL 13X1.27X60MM (BLADE) ×1
BLADE SAW SGTL 83.5X18.5 (BLADE) ×3 IMPLANT
BNDG ESMARK 6X9 LF (GAUZE/BANDAGES/DRESSINGS) ×3
BOWL SMART MIX CTS (DISPOSABLE) ×3 IMPLANT
CAP POR TM CP VIT E LN CER HD ×3 IMPLANT
CEMENT BONE SIMPLEX SPEEDSET (Cement) ×6 IMPLANT
COVER SURGICAL LIGHT HANDLE (MISCELLANEOUS) ×3 IMPLANT
CUFF TOURNIQUET SINGLE 34IN LL (TOURNIQUET CUFF) ×3 IMPLANT
DRAPE EXTREMITY T 121X128X90 (DRAPE) ×3 IMPLANT
DRAPE INCISE IOBAN 66X45 STRL (DRAPES) ×6 IMPLANT
DRAPE PROXIMA HALF (DRAPES) ×3 IMPLANT
DRAPE U-SHAPE 47X51 STRL (DRAPES) ×3 IMPLANT
DRSG ADAPTIC 3X8 NADH LF (GAUZE/BANDAGES/DRESSINGS) ×3 IMPLANT
DRSG PAD ABDOMINAL 8X10 ST (GAUZE/BANDAGES/DRESSINGS) ×3 IMPLANT
DURAPREP 26ML APPLICATOR (WOUND CARE) ×6 IMPLANT
ELECT REM PT RETURN 9FT ADLT (ELECTROSURGICAL) ×3
ELECTRODE REM PT RTRN 9FT ADLT (ELECTROSURGICAL) ×1 IMPLANT
EVACUATOR 1/8 PVC DRAIN (DRAIN) ×3 IMPLANT
GLOVE BIOGEL M 7.0 STRL (GLOVE) ×3 IMPLANT
GLOVE BIOGEL PI IND STRL 7.5 (GLOVE) IMPLANT
GLOVE BIOGEL PI IND STRL 8.5 (GLOVE) ×2 IMPLANT
GLOVE BIOGEL PI INDICATOR 7.5 (GLOVE)
GLOVE BIOGEL PI INDICATOR 8.5 (GLOVE) ×4
GLOVE SURG ORTHO 8.0 STRL STRW (GLOVE) ×6 IMPLANT
GOWN STRL REUS W/ TWL LRG LVL3 (GOWN DISPOSABLE) ×2 IMPLANT
GOWN STRL REUS W/ TWL XL LVL3 (GOWN DISPOSABLE) ×2 IMPLANT
GOWN STRL REUS W/TWL LRG LVL3 (GOWN DISPOSABLE) ×4
GOWN STRL REUS W/TWL XL LVL3 (GOWN DISPOSABLE) ×4
HANDPIECE INTERPULSE COAX TIP (DISPOSABLE) ×2
HOOD PEEL AWAY FACE SHEILD DIS (HOOD) ×12 IMPLANT
KIT BASIN OR (CUSTOM PROCEDURE TRAY) ×3 IMPLANT
KIT ROOM TURNOVER OR (KITS) ×3 IMPLANT
MANIFOLD NEPTUNE II (INSTRUMENTS) ×3 IMPLANT
NEEDLE 22X1 1/2 (OR ONLY) (NEEDLE) ×6 IMPLANT
NS IRRIG 1000ML POUR BTL (IV SOLUTION) ×3 IMPLANT
PACK TOTAL JOINT (CUSTOM PROCEDURE TRAY) ×3 IMPLANT
PAD ABD 8X10 STRL (GAUZE/BANDAGES/DRESSINGS) ×3 IMPLANT
PAD ARMBOARD 7.5X6 YLW CONV (MISCELLANEOUS) ×6 IMPLANT
PADDING CAST COTTON 6X4 STRL (CAST SUPPLIES) ×3 IMPLANT
SET HNDPC FAN SPRY TIP SCT (DISPOSABLE) ×1 IMPLANT
SPONGE GAUZE 4X4 12PLY (GAUZE/BANDAGES/DRESSINGS) ×3 IMPLANT
STAPLER VISISTAT 35W (STAPLE) ×3 IMPLANT
SUCTION FRAZIER TIP 10 FR DISP (SUCTIONS) ×3 IMPLANT
SUT BONE WAX W31G (SUTURE) ×3 IMPLANT
SUT VIC AB 0 CTB1 27 (SUTURE) ×6 IMPLANT
SUT VIC AB 1 CT1 27 (SUTURE) ×4
SUT VIC AB 1 CT1 27XBRD ANBCTR (SUTURE) ×2 IMPLANT
SUT VIC AB 2-0 CT1 27 (SUTURE) ×4
SUT VIC AB 2-0 CT1 TAPERPNT 27 (SUTURE) ×2 IMPLANT
SYR CONTROL 10ML LL (SYRINGE) ×6 IMPLANT
TOWEL OR 17X24 6PK STRL BLUE (TOWEL DISPOSABLE) ×3 IMPLANT
TOWEL OR 17X26 10 PK STRL BLUE (TOWEL DISPOSABLE) ×3 IMPLANT
TRAY FOLEY CATH 14FR (SET/KITS/TRAYS/PACK) ×3 IMPLANT
WATER STERILE IRR 1000ML POUR (IV SOLUTION) IMPLANT

## 2013-09-30 NOTE — Preoperative (Addendum)
Beta Blockers   Reason not to administer Beta Blockers:Not Applicable, pt took 03/13/06

## 2013-09-30 NOTE — Progress Notes (Signed)
Orthopedic Tech Progress Note Patient Details:  Monica Williamson Dec 14, 1940 [id number]  CPM Right Knee CPM Right Knee: On Right Knee Flexion (Degrees): 90 Right Knee Extension (Degrees): 0 Additional Comments: Trapeze bar   Cammer, Theodoro Parma 09/30/2013, 12:38 PM

## 2013-09-30 NOTE — H&P (Signed)
Monica Williamson MRN:  [id number] DOB/SEX:  08/22/1940/female  CHIEF COMPLAINT:  Painful right Knee  HISTORY: Patient is a 73 y.o. female presented with a history of pain in the right knee. Onset of symptoms was gradual starting several years ago with gradually worsening course since that time. Prior procedures on the knee include arthroscopy. Patient has been treated conservatively with over-the-counter NSAIDs and activity modification. Patient currently rates pain in the knee at 10 out of 10 with activity. There is pain at night.  PAST MEDICAL HISTORY: Patient Active Problem List   Diagnosis Date Noted  . Long term (current) use of anticoagulants 10/01/2012  . Atrial fibrillation 09/25/2012  . Chest pain 10/26/2010  . 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  . BRONCHIECTASIS 08/13/2007  . ALLERGIC RHINITIS 05/12/2007  . ASTHMA, CHILDHOOD 05/12/2007  . ABSCESS, BREAST 05/12/2007  . COUGH 05/12/2007  . HYSTERECTOMY, VAGINAL, HX OF 05/12/2007   Past Medical History  Diagnosis Date  . Hypertension   . Childhood asthma   . Diverticulosis   . Vitamin D deficiency   . Depression   . Anxiety   . Pulmonary nodule   . Nocturia   . Colon polyps   . Cholelithiasis   . Atrial fibrillation     when takes Warfarin  . Dysrhythmia   . Atrial fibrillation, currently in sinus rhythm   . Bronchiectasis     contolled with lovent and allergy med  . Sleep related teeth grinding     wears a mouth guard at night  . Stress fracture of tibia 2014   Past Surgical History  Procedure Laterality Date  . Vesicovaginal fistula closure w/ tah    . Incision and drainage breast abscess    . Vaginal hysterectomy    . Cholecystectomy      11/2010  . Breast surgery    . Meniscus repair Right 2014     MEDICATIONS:   No prescriptions prior to admission    ALLERGIES:   Allergies  Allergen Reactions  . Warfarin And Related Hives and Itching  . Avelox [Moxifloxacin] Diarrhea    REVIEW OF SYSTEMS:   Pertinent items are noted in HPI.   FAMILY HISTORY:   Family History  Problem Relation Age of Onset  . Heart disease Sister   . Colon cancer Neg Hx     SOCIAL HISTORY:   History  Substance Use Topics  . Smoking status: Former Smoker -- 1.50 packs/day for 20 years    Quit date: 07/18/1978  . Smokeless tobacco: Not on file  . Alcohol Use: 2.4 oz/week    4 Glasses of wine per week     Comment: 1-4 glasses daily      EXAMINATION:  Vital signs in last 24 hours:    General appearance: alert, cooperative and no distress Lungs: clear to auscultation bilaterally Heart: regular rate and rhythm, S1, S2 normal, no murmur, click, rub or gallop Abdomen: soft, non-tender; bowel sounds normal; no masses,  no organomegaly Extremities: extremities normal, atraumatic, no cyanosis or edema and Homans sign is negative, no sign of DVT Pulses: 2+ and symmetric Skin: Skin color, texture, turgor normal. No rashes or lesions Neurologic: Alert and oriented X 3, normal strength and tone. Normal symmetric reflexes. Normal coordination and gait  Musculoskeletal:  ROM 0-110, Ligaments intact,  Imaging Review Plain radiographs demonstrate severe degenerative joint disease of the right knee. The overall alignment is mild valgus. The bone quality appears to be good for age and reported activity level.  Assessment/Plan:  End stage arthritis, right knee   The patient history, physical examination and imaging studies are consistent with advanced degenerative joint disease of the right knee. The patient has failed conservative treatment.  The clearance notes were reviewed.  After discussion with the patient it was felt that Total Knee Replacement was indicated. The procedure,  risks, and benefits of total knee arthroplasty were presented and reviewed. The risks including but not limited to aseptic loosening, infection, blood clots, vascular injury, stiffness, patella tracking problems complications among others  were discussed. The patient acknowledged the explanation, agreed to proceed with the plan.  Laticia Vannostrand 09/30/2013, 6:41 AM

## 2013-09-30 NOTE — Plan of Care (Signed)
Problem: Consults Goal: Diagnosis- Total Joint Replacement Primary Total Knee Right     

## 2013-09-30 NOTE — Transfer of Care (Signed)
Immediate Anesthesia Transfer of Care Note  Patient: Monica Williamson  Procedure(s) Performed: Procedure(s): RIGHT TOTAL KNEE ARTHROPLASTY (Right)  Patient Location: PACU  Anesthesia Type:GA combined with regional for post-op pain  Level of Consciousness: awake, alert  and oriented  Airway & Oxygen Therapy: Patient Spontanous Breathing and Patient connected to nasal cannula oxygen  Post-op Assessment: Report given to PACU RN, Post -op Vital signs reviewed and stable and Patient moving all extremities X 4  Post vital signs: Reviewed and stable  Complications: No apparent anesthesia complications

## 2013-09-30 NOTE — Anesthesia Postprocedure Evaluation (Signed)
Anesthesia Post Note  Patient: Monica Williamson  Procedure(s) Performed: Procedure(s) (LRB): RIGHT TOTAL KNEE ARTHROPLASTY (Right)  Anesthesia type: general  Patient location: PACU  Post pain: Pain level controlled  Post assessment: Patient's Cardiovascular Status Stable  Last Vitals:  Filed Vitals:   09/30/13 1248  BP: 116/49  Pulse: 57  Temp: 36.3 C  Resp: 16    Post vital signs: Reviewed and stable  Level of consciousness: sedated  Complications: No apparent anesthesia complications

## 2013-09-30 NOTE — Progress Notes (Signed)
Patient would like Celebrex 200 mg PO removed from her MAR.  She stated that she can not have this medication while taking Xarelto.

## 2013-09-30 NOTE — Anesthesia Preprocedure Evaluation (Addendum)
Anesthesia Evaluation  Patient identified by MRN, date of birth, ID band Patient awake    Reviewed: Allergy & Precautions, H&P , NPO status , Patient's Chart, lab work & pertinent test results  Airway Mallampati: II TM Distance: >3 FB Neck ROM: Full    Dental  (+) Dental Advisory Given, Teeth Intact   Pulmonary asthma , former smoker,    Pulmonary exam normal       Cardiovascular hypertension, + dysrhythmias Atrial Fibrillation     Neuro/Psych PSYCHIATRIC DISORDERS Anxiety Depression negative neurological ROS     GI/Hepatic negative GI ROS, Neg liver ROS,   Endo/Other    Renal/GU negative Renal ROS     Musculoskeletal   Abdominal   Peds  Hematology   Anesthesia Other Findings   Reproductive/Obstetrics                          Anesthesia Physical Anesthesia Plan  ASA: III  Anesthesia Plan: General   Post-op Pain Management:    Induction: Intravenous  Airway Management Planned: LMA  Additional Equipment:   Intra-op Plan:   Post-operative Plan: Extubation in OR  Informed Consent: I have reviewed the patients History and Physical, chart, labs and discussed the procedure including the risks, benefits and alternatives for the proposed anesthesia with the patient or authorized representative who has indicated his/her understanding and acceptance.   Dental advisory given  Plan Discussed with: CRNA, Anesthesiologist and Surgeon  Anesthesia Plan Comments:         Anesthesia Quick Evaluation

## 2013-09-30 NOTE — Anesthesia Procedure Notes (Addendum)
Anesthesia Regional Block:  Adductor canal block  Pre-Anesthetic Checklist: ,, timeout performed, Correct Patient, Correct Site, Correct Laterality, Correct Procedure, Correct Position, site marked, Risks and benefits discussed,  Surgical consent,  Pre-op evaluation,  At surgeon's request and post-op pain management  Laterality: Right  Prep: chloraprep       Needles:  Injection technique: Single-shot  Needle Type: Echogenic Stimulator Needle     Needle Length: 10cm 10 cm Needle Gauge: 21 and 21 G    Additional Needles:  Procedures: ultrasound guided (picture in chart) Adductor canal block Narrative:  Start time: 09/30/2013 9:16 AM End time: 09/30/2013 9:26 AM Injection made incrementally with aspirations every 5 mL.  Performed by: Personally    Procedure Name: LMA Insertion Date/Time: 09/30/2013 9:56 AM Performed by: Carola Frost Pre-anesthesia Checklist: Patient identified, Timeout performed, Emergency Drugs available, Suction available and Patient being monitored Patient Re-evaluated:Patient Re-evaluated prior to inductionOxygen Delivery Method: Circle system utilized Preoxygenation: Pre-oxygenation with 100% oxygen Intubation Type: IV induction Ventilation: Mask ventilation without difficulty LMA: LMA inserted LMA Size: 4.0 Number of attempts: 1 Placement Confirmation: positive ETCO2,  ETT inserted through vocal cords under direct vision and breath sounds checked- equal and bilateral Tube secured with: Tape Dental Injury: Teeth and Oropharynx as per pre-operative assessment

## 2013-09-30 NOTE — Progress Notes (Signed)
Utilization review completed.  

## 2013-09-30 NOTE — Evaluation (Signed)
Physical Therapy Evaluation Patient Details Name: Monica Williamson MRN: [id number] DOB: 1941/06/28 Today's Date: 09/30/2013 Time: 4696-2952 PT Time Calculation (min): 24 min  PT Assessment / Plan / Recommendation History of Present Illness  Patient is a 73 yo female s/p Rt TKA.  Clinical Impression  Patient presents with problems listed below.  Will benefit from acute PT to maximize independence prior to discharge home with husband.  Patient very lethargic today, impacting mobility.    PT Assessment  Patient needs continued PT services    Follow Up Recommendations  Home health PT;Supervision/Assistance - 24 hour    Does the patient have the potential to tolerate intense rehabilitation      Barriers to Discharge        Equipment Recommendations  None recommended by PT    Recommendations for Other Services     Frequency 7X/week    Precautions / Restrictions Precautions Precautions: Knee;Fall Precaution Booklet Issued: Yes (comment) Precaution Comments: Reviewed precautions with patient and husband.  Patient very lethargic, falling asleep during education. Restrictions Weight Bearing Restrictions: Yes RLE Weight Bearing: Weight bearing as tolerated   Pertinent Vitals/Pain       Mobility  Bed Mobility Overal bed mobility: Needs Assistance Bed Mobility: Supine to Sit;Sit to Supine Supine to sit: Min assist Sit to supine: Min assist General bed mobility comments: Verbal and tactile cues for technique.  Assist to move LE's off of bed and to raise trunk.  Once upright, patient able to maintain sitting balance with min guard assist.  Required verbal and tactile stimulation every 10-15 seconds to remain awake/upright.  Patient c/o nausea.  Required assist to lower trunk and raise LE's onto bed.    Exercises Total Joint Exercises Ankle Circles/Pumps: AAROM;Right;5 reps;Supine   PT Diagnosis: Difficulty walking  PT Problem List: Decreased strength;Decreased range of  motion;Decreased activity tolerance;Decreased balance;Decreased mobility;Decreased knowledge of use of DME;Decreased knowledge of precautions PT Treatment Interventions: DME instruction;Gait training;Stair training;Functional mobility training;Therapeutic exercise;Patient/family education     PT Goals(Current goals can be found in the care plan section) Acute Rehab PT Goals Patient Stated Goal: None stated due to lethargy PT Goal Formulation: With patient/family Time For Goal Achievement: 10/07/13 Potential to Achieve Goals: Good  Visit Information  Last PT Received On: 09/30/13 Assistance Needed: +1 History of Present Illness: Patient is a 73 yo female s/p Rt TKA.       Prior [place] expects to be discharged to:: Private residence Living Arrangements: Spouse/significant other Available Help at Discharge: Family;Available 24 hours/day Type of Home: House ([place]) Home Access: Stairs to enter [employer] of Steps: 5 Entrance Stairs-Rails: Right Home Layout: Two level;Able to live on main level with bedroom/bathroom Home Equipment: Gilford Rile - 2 wheels;Bedside commode Prior Function Level of Independence: Independent Communication Communication: No difficulties    Cognition  Cognition Arousal/Alertness: Lethargic;Suspect due to medications (Unable to stay awake for more than 15 seconds) Behavior During Therapy: [facility] for tasks assessed/performed (Sleepy) Overall Cognitive Status: Difficult to assess Difficult to assess due to: Level of arousal    Extremity/Trunk Assessment Upper Extremity Assessment Upper Extremity Assessment: Overall WFL for tasks assessed Lower Extremity Assessment Lower Extremity Assessment: RLE deficits/detail RLE Deficits / Details: Decreased strength and ROM due to surgery   Balance    End of Session PT - End of Session Activity Tolerance: Patient limited by lethargy Patient left: in bed;with call  bell/phone within reach;with family/visitor present;with nursing/sitter in room Nurse Communication: Mobility status (Nausea, O2 not  on or connected to wall, Lethargy) CPM Right Knee CPM Right Knee: Off  GP     Despina Pole 09/30/2013, 7:37 PM Carita Pian. Sanjuana Kava, [place] Pager [phone]

## 2013-09-30 NOTE — Care Management Note (Signed)
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     Action/Plan:   Case Manager spoke with patient's husband concerning home health and DME needs at discharge. Preoperatively setup with [facility], no changes. DME has been delivered. patient has family support at discharge.  PT/OT to eval.   Anticipated DC Date:  10/02/2013   Anticipated DC Plan:  [place]  CM consult      [place]   Choice offered to / List presented to:  C-3 Spouse   DME arranged  [employer]  [facility]  3-N-1      DME agency  [employer]     [place] arranged  HH-2 PT      [facility] agency  [facility]   Status of service:  In process, will continue to follow

## 2013-10-01 ENCOUNTER — Encounter (HOSPITAL_COMMUNITY): Payer: Self-pay | Admitting: Orthopedic Surgery

## 2013-10-01 LAB — BASIC METABOLIC PANEL
BUN: 22 mg/dL (ref 6–23)
CO2: 26 meq/L (ref 19–32)
Calcium: 7.5 mg/dL — ABNORMAL LOW (ref 8.4–10.5)
Chloride: 102 mEq/L (ref 96–112)
Creatinine, Ser: 0.77 mg/dL (ref 0.50–1.10)
GFR calc Af Amer: >90 mL/min (ref >90)
GFR calc non Af Amer: 81 mL/min — ABNORMAL LOW (ref >90)
GLUCOSE: 148 mg/dL — AB (ref 70–99)
POTASSIUM: 5.7 meq/L — AB (ref 3.7–5.3)
SODIUM: 137 meq/L (ref 137–147)

## 2013-10-01 LAB — CBC
HCT: 32.4 % — ABNORMAL LOW (ref 36.0–46.0)
HEMOGLOBIN: 10.6 g/dL — AB (ref 12.0–15.0)
MCH: 31 pg (ref 26.0–34.0)
MCHC: 32.7 g/dL (ref 30.0–36.0)
MCV: 94.7 fL (ref 78.0–100.0)
PLATELETS: 262 10*3/uL (ref 150–400)
RBC: 3.42 MIL/uL — AB (ref 3.87–5.11)
RDW: 15.3 % (ref 11.5–15.5)
WBC: 17.2 10*3/uL — AB (ref 4.0–10.5)

## 2013-10-01 MED ORDER — HYDROCODONE-ACETAMINOPHEN 5-325 MG PO TABS
1.0000 | ORAL_TABLET | ORAL | Status: DC | PRN
  Administered 2013-10-01 – 2013-10-02 (×4): 2 via ORAL
  Filled 2013-10-01 (×4): qty 2

## 2013-10-01 MED ORDER — PANTOPRAZOLE SODIUM 20 MG PO TBEC
20.0000 mg | DELAYED_RELEASE_TABLET | Freq: Every day | ORAL | Status: DC
Start: 2013-10-01 — End: 2013-10-02
  Administered 2013-10-02: 20 mg via ORAL
  Filled 2013-10-01 (×2): qty 1

## 2013-10-01 MED ORDER — OXYCODONE HCL 5 MG PO TABS: 5.0000 mg | ORAL_TABLET | ORAL | Status: DC | PRN

## 2013-10-01 MED ORDER — METHOCARBAMOL 500 MG PO TABS: 500.0000 mg | ORAL_TABLET | Freq: Four times a day (QID) | ORAL | Status: DC | PRN

## 2013-10-01 NOTE — Discharge Instructions (Signed)
Diet: As you were doing prior to hospitalization   Activity:  Increase activity slowly as tolerated                  No lifting or driving for 6 weeks  Shower:  May shower without a dressing once there is no drainage from your wound.                 Do NOT wash over the wound.                 Dressing:  You may change your dressing on Thursday                    Then change the dressing daily with sterile 4"x4"s gauze dressing                     And TED hose for knees.  Weight Bearing:  Weight bearing as tolerated as taught in physical therapy.  Use a                                walker or Crutches as instructed.  To prevent constipation: you may use a stool softener such as -               Colace ( over the counter) 100 mg by mouth twice a day                Drink plenty of fluids ( prune juice may be helpful) and high fiber foods                Miralax ( over the counter) for constipation as needed.    Precautions:  If you experience chest pain or shortness of breath - call 911 immediately               For transfer to the hospital emergency department!!               If you develop a fever greater that 101 F, purulent drainage from wound,                             increased redness or drainage from wound, or calf pain -- Call the office.  Follow- Up Appointment:  Please call for an appointment to be seen on 10/15/13                                              [facility] office:  [phone]            [address] [place], [place] [address]

## 2013-10-01 NOTE — Progress Notes (Signed)
Physical Therapy Treatment Patient Details Name: Monica Williamson MRN: [id number] DOB: 29-Jul-1940 Today's Date: 10/01/2013 Time: 0912-1009 PT Time Calculation (min): 57 min  PT Assessment / Plan / Recommendation  History of Present Illness Patient is a 73 yo female s/p Rt TKA.   PT Comments   Pt very motivated to participate, and much improved over yesterday's initial eval; Still, activity tolerance somewhat limited by nausea and dizziness, with frequent need for seated breaks  Based on the am session; will recommend another day in hospital to continue to work to maximize independence and safety with mobility prior to dc  Of note: pt with difficulty with active dorsiflexion R ankle, minimal to no Anterior Tib firing noted, resulting in decr heel strike R during gait, and steppage R  Follow Up Recommendations  Home health PT;Supervision/Assistance - 24 hour     Does the patient have the potential to tolerate intense rehabilitation     Barriers to Discharge        Equipment Recommendations  None recommended by PT    Recommendations for Other Services OT consult  Frequency 7X/week   Progress towards PT Goals Progress towards PT goals: Progressing toward goals  Plan Current plan remains appropriate    Precautions / Restrictions Precautions Precautions: Knee;Fall Precaution Comments: Reviewed precautions with patient and husband Restrictions Weight Bearing Restrictions: Yes RLE Weight Bearing: Weight bearing as tolerated   Pertinent Vitals/Pain 5/10 R knee patient repositioned for comfort and Optimal knee extension     Mobility  Transfers Overall transfer level: Needs assistance Equipment used: Rolling walker (2 wheeled) Transfers: Sit to/from Stand Sit to Stand: Min guard General transfer comment: Cues for safety and hand placement Ambulation/Gait Ambulation/Gait assistance: Min guard;+2 safety/equipment Ambulation Distance (Feet): 40 Feet Assistive device:  Rolling walker (2 wheeled) Gait Pattern/deviations: Steppage (R foot) General Gait Details: Cues for gait sequence, optimal step length, and to activate R quad for stance stability; Of note, pt with R foot drop and inability to heel strike RLE    Exercises Total Joint Exercises Ankle Circles/Pumps: AAROM;PROM;Right;10 reps Quad Sets: AROM;Right;10 reps   PT Diagnosis:    PT Problem List:   PT Treatment Interventions:     PT Goals (current goals can now be found in the care plan section) Acute Rehab PT Goals Patient Stated Goal: Wants to have a good summer PT Goal Formulation: With patient/family Time For Goal Achievement: 10/07/13 Potential to Achieve Goals: Good  Visit Information  Last PT Received On: 10/01/13 Assistance Needed: +1 History of Present Illness: Patient is a 73 yo female s/p Rt TKA.    Subjective Data  Subjective: REALLY wanting to work Patient Stated Goal: Wants to have a good summer   Cognition  Cognition Arousal/Alertness: Awake/alert Behavior During Therapy: WFL for tasks assessed/performed Overall Cognitive Status: Within Functional Limits for tasks assessed    Balance     End of Session PT - End of Session Equipment Utilized During Treatment: Gait belt Activity Tolerance: Patient tolerated treatment well Patient left: in chair;with call bell/phone within reach;with family/visitor present Nurse Communication: Mobility status;Other (comment) (no appreciable dorsiflexion activation) CPM Right Knee CPM Right Knee: On Right Knee Flexion (Degrees): 90 Right Knee Extension (Degrees): 0   GP     Roney Marion Hamff 10/01/2013, 11:56 AM  Roney Marion, PT  Acute Rehabilitation Services Pager [phone] Office [phone]

## 2013-10-01 NOTE — Evaluation (Signed)
Occupational Therapy Evaluation Patient Details Name: Monica Williamson MRN: [id number] DOB: Nov 23, 1940 Today's Date: 10/01/2013 Time: 6295-2841 OT Time Calculation (min): 14 min  OT Assessment / Plan / Recommendation History of present illness Patient is a 73 yo female s/p Rt TKA.   Clinical Impression   Pt PLOF is independent. Pt limited in OT session today by nausea/vomiting. Pt needs min A for LB ADLs but indicated spouse will help her with those. Continued acute OT indicated for practicing shower transfers.    OT Assessment  Patient needs continued OT Services    Follow Up Recommendations  No OT follow up    Barriers to Discharge      Equipment Recommendations  3 in 1 bedside comode    Recommendations for Other Services    Frequency  Min 2X/week    Precautions / Restrictions Precautions Precautions: Knee;Fall Precaution Booklet Issued: Yes (comment) Precaution Comments: Reviewed precautions with patient and husband Restrictions Weight Bearing Restrictions: Yes RLE Weight Bearing: Weight bearing as tolerated   Pertinent Vitals/Pain 4-5/10. Repositioned.    ADL  Eating/Feeding: Set up Where Assessed - Eating/Feeding: Chair Grooming: Set up Where Assessed - Grooming: Supported sitting Upper Body Bathing: Set up Where Assessed - Upper Body Bathing: Supported sitting Lower Body Bathing: Minimal assistance Where Assessed - Lower Body Bathing: Supported sitting Upper Body Dressing: Set up Where Assessed - Upper Body Dressing: Supported sitting Lower Body Dressing: Minimal assistance Where Assessed - Lower Body Dressing: Supported sit to Lobbyist: Magazine features editor Method: Sit to Loss adjuster, chartered: Raised toilet seat with arms (or 3-in-1 over toilet) Toileting - Clothing Manipulation and Hygiene: Minimal assistance Where Assessed - Best boy and Hygiene: Sit to stand from 3-in-1 or toilet Tub/Shower  Transfer: Min guard Tub/Shower Transfer Method: Therapist, art: Other (comment);Walk in shower (3n1) Equipment Used: Gait belt;Rolling walker Transfers/Ambulation Related to ADLs: min guard - min A ADL Comments: Pt limited in ADL participation this session due to experiencing nausea/vomiting. Pt and spouse stated spouse will assist with LB bathing and dressing during recovery. Educated on safe completion of ADLs with knee and fall precautions.     OT Diagnosis: Generalized weakness;Acute pain  OT Problem List: Decreased range of motion;Decreased activity tolerance;Decreased knowledge of use of DME or AE;Decreased knowledge of precautions;Pain OT Treatment Interventions: Self-care/ADL training;Therapeutic exercise;DME and/or AE instruction;Manual therapy;Therapeutic activities;Balance training;Patient/family education   OT Goals(Current goals can be found in the care plan section) Acute Rehab OT Goals Patient Stated Goal: not stated OT Goal Formulation: With patient/family Time For Goal Achievement: 10/08/13 Potential to Achieve Goals: Good ADL Goals Pt Will Perform Tub/Shower Transfer: Shower transfer;with min guard assist;ambulating;3 in 1;rolling walker  Visit Information  Last OT Received On: 10/01/13 Assistance Needed: +1 History of Present Illness: Patient is a 73 yo female s/p Rt TKA.       Prior [place] expects to be discharged to:: Private residence Living Arrangements: Spouse/significant other Available Help at Discharge: Family;Available 24 hours/day Type of Home: House Home Access: Stairs to enter [employer] of Steps: 5 Entrance Stairs-Rails: Right Home Layout: Two level;Able to live on main level with bedroom/bathroom Home Equipment: Gilford Rile - 2 wheels;Bedside commode Prior Function Level of Independence: Independent Communication Communication: No difficulties          Vision/Perception     Cognition  Cognition Arousal/Alertness: Awake/alert Behavior During Therapy: WFL for tasks assessed/performed Overall Cognitive Status: Within Functional Limits for  tasks assessed    Extremity/Trunk Assessment Upper Extremity Assessment Upper Extremity Assessment: Generalized weakness;Overall [facility] for tasks assessed Lower Extremity Assessment Lower Extremity Assessment: Defer to PT evaluation     Mobility Bed Mobility Overal bed mobility: Needs Assistance Bed Mobility: Supine to Sit Supine to sit: Min guard General bed mobility comments: Cues for technique Transfers Overall transfer level: Needs assistance Equipment used: Rolling walker (2 wheeled) Transfers: Sit to/from Stand Sit to Stand: Min guard General transfer comment: Cues for safety and hand placement        Balance Balance Overall balance assessment: Needs assistance (pt has been having periods of nausea/vomiting)   End of Session OT - End of Session Equipment Utilized During Treatment: Gait belt;Rolling walker Activity Tolerance: Patient limited by fatigue;Other (comment) (Pt limited by nausea/vomiting) Patient left: in chair;with call bell/phone within reach;with family/visitor present  [place] OTR/L Pager: [phone]  10/01/2013, 4:54 PM

## 2013-10-01 NOTE — Progress Notes (Signed)
SPORTS MEDICINE AND JOINT REPLACEMENT  [name], MD   Carlynn Spry, PA-C [place], [place], [place]  [address]                             [phone]   PROGRESS NOTE  Subjective:  negative for Chest Pain  negative for Shortness of Breath  negative for Nausea/Vomiting   negative for Calf Pain  negative for Bowel Movement   Tolerating Diet: yes         Patient reports pain as 4 on 0-10 scale.    Objective: Vital signs in last 24 hours:   Patient Vitals for the past 24 hrs:  BP Temp Temp src Pulse Resp SpO2 Height Weight  10/01/13 0500 114/43 mmHg 97.7 F (36.5 C) - - 16 95 % - -  10/01/13 0300 108/44 mmHg 98.1 F (36.7 C) - 64 16 95 % - -  09/30/13 2300 116/45 mmHg 94.4 F (34.7 C) Oral 58 16 95 % - -  09/30/13 1424 - - - - - - 5' 6.93" (1.7 m) 70.3 kg (154 lb 15.7 oz)  09/30/13 1248 116/49 mmHg 97.4 F (36.3 C) Oral 57 16 93 % - -  09/30/13 1230 121/46 mmHg 97.3 F (36.3 C) - 60 15 100 % - -  09/30/13 1229 121/46 mmHg - - - - 100 % - -  09/30/13 1215 - - - 71 24 100 % - -  09/30/13 1200 130/49 mmHg 97.3 F (36.3 C) - 70 11 100 % - -  09/30/13 0931 130/101 mmHg - - - - - - -  09/30/13 0930 - - - 51 15 100 % - -  09/30/13 0929 - - - 51 12 100 % - -  09/30/13 0928 111/36 mmHg - - 49 8 100 % - -  09/30/13 0927 113/36 mmHg - - 48 11 100 % - -  09/30/13 0926 - - - 55 23 100 % - -  09/30/13 0925 111/36 mmHg - - 54 12 100 % - -  09/30/13 0924 - - - 50 10 100 % - -  09/30/13 0923 - - - 50 2 100 % - -  09/30/13 0922 - - - 57 15 98 % - -  09/30/13 0921 - - - 54 14 99 % - -  09/30/13 0920 117/42 mmHg - - 56 16 97 % - -  09/30/13 0919 - - - 53 14 99 % - -  09/30/13 0918 - - - 53 18 93 % - -  09/30/13 0915 111/49 mmHg - - 51 10 93 % - -  09/30/13 0910 165/63 mmHg - - 59 17 100 % - -    @flow {1959:LAST@   Intake/Output from previous day:   03/16 0701 - 03/17 0700 In: 1941.3 [I.V.:1891.3] Out: 175 [Drains:125]   Intake/Output this shift:       Intake/Output     03/16 0701 - 03/17 0700 03/17 0701 - 03/18 0700   I.V. (mL/kg) 1891.3 (26.9)    IV Piggyback 50    Total Intake(mL/kg) 1941.3 (27.6)    Drains 125    Blood 50    Total Output 175     Net +1766.3          Urine Occurrence 2 x       LABORATORY DATA:  Recent Labs  09/26/13 0918 10/01/13 0705  WBC 4.1 17.2*  HGB 13.8 10.6*  HCT 41.2 32.4*  PLT 258 262    Recent Labs  09/26/13 0918  NA 140  K 4.4  CL 103  CO2 25  BUN 17  CREATININE 0.73  GLUCOSE 71  CALCIUM 9.3   Lab Results  Component Value Date   INR 1.00 09/30/2013   INR 1.47 09/26/2013   INR 1.2 10/05/2012    Examination:  General appearance: alert, cooperative and no distress Extremities: Homans sign is negative, no sign of DVT  Wound Exam: clean, dry, intact   Drainage:  Scant/small amount Serosanguinous exudate  Motor Exam: EHL and FHL Intact  Sensory Exam: Deep Peroneal normal   Assessment:    1 Day Post-Op  Procedure(s) (LRB): RIGHT TOTAL KNEE ARTHROPLASTY (Right)  ADDITIONAL DIAGNOSIS:  Active Problems:   S/P total knee arthroplasty  Acute Blood Loss Anemia   Plan: Physical Therapy as ordered Weight Bearing as Tolerated (WBAT)  DVT Prophylaxis: xarelto  DISCHARGE PLAN: Home  DISCHARGE NEEDS: HHPT, CPM, Walker and 3-in-1 comode seat         Monica Williamson 10/01/2013, 9:06 AM

## 2013-10-01 NOTE — Care Management Note (Signed)
CARE MANAGEMENT NOTE 10/01/2013  Patient:  CLARENE, CURRAN   Account Number:  [id number]  Date Initiated:  09/30/2013  Documentation initiated by:  Ricki Miller  Subjective/Objective Assessment:   73 yr old female s/p right total knee arthroplasty.     Action/Plan:   Case Manager spoke with patient's husband concerning home health and DME needs at discharge. Preoperatively setup with [facility], no changes. DME has been delivered. patient has family support at discharge.  PT/OT to eval.   Anticipated DC Date:  10/03/2013   Anticipated DC Plan:  [place]  CM consult      [place]   Choice offered to / List presented to:  C-3 Spouse   DME arranged  [employer]  3-N-1      DME agency  [employer]     [place] arranged  HH-2 PT      [place]   Status of service:  Completed, signed off Medicare Important Message given?   (If response is "NO", the following Medicare IM given date fields will be blank) Date Medicare IM given:   Date Additional Medicare IM given:    Discharge Disposition:  [place]

## 2013-10-01 NOTE — Progress Notes (Signed)
Physical Therapy Treatment Patient Details Name: Monica Williamson MRN: [id number] DOB: 1941/06/11 Today's Date: 10/01/2013 Time: 1751-0258 PT Time Calculation (min): 30 min  PT Assessment / Plan / Recommendation  History of Present Illness Patient is a 73 yo female s/p Rt TKA.   PT Comments   Making progress with mobility, though limited by nausea; Continues with no appreciable active dorsiflexion Despite these, anticipate progress with functional mobility, and plan to stair train next session  Should be able to dc home tomorrow   Follow Up Recommendations  Home health PT;Supervision/Assistance - 24 hour     Does the patient have the potential to tolerate intense rehabilitation     Barriers to Discharge        Equipment Recommendations  None recommended by PT    Recommendations for Other Services    Frequency 7X/week   Progress towards PT Goals Progress towards PT goals: Progressing toward goals  Plan Current plan remains appropriate    Precautions / Restrictions Precautions Precautions: Knee;Fall Precaution Booklet Issued: Yes (comment) Precaution Comments: Reviewed precautions with patient and husband Restrictions RLE Weight Bearing: Weight bearing as tolerated   Pertinent Vitals/Pain 5/10 R knee pain patient repositioned for comfort and Optimal knee extension     Mobility  Bed Mobility Overal bed mobility: Needs Assistance Bed Mobility: Supine to Sit Supine to sit: Min guard General bed mobility comments: Cues for technique Transfers Overall transfer level: Needs assistance Equipment used: Rolling walker (2 wheeled) Transfers: Sit to/from Stand Sit to Stand: Min guard General transfer comment: Cues for safety and hand placement Ambulation/Gait Ambulation/Gait assistance: Min guard Ambulation Distance (Feet): 60 Feet Assistive device: Rolling walker (2 wheeled) Gait Pattern/deviations: Step-through pattern;Decreased dorsiflexion - right General Gait  Details: Cues for gait sequence, optimal step length, and to activate R quad for stance stability; Of note, pt with R foot drop and inability to heel strike RLE    Exercises Total Joint Exercises Ankle Circles/Pumps: AAROM;PROM;Right;10 reps Quad Sets: AROM;Right;10 reps Heel Slides: AAROM;Right;5 reps Straight Leg Raises: AAROM;Right;10 reps Long Arc Quad: AAROM;AROM;Right;10 reps Goniometric ROM: 0-80   PT Diagnosis:    PT Problem List:   PT Treatment Interventions:     PT Goals (current goals can now be found in the care plan section) Acute Rehab PT Goals Patient Stated Goal: Wants to have a good summer PT Goal Formulation: With patient/family Time For Goal Achievement: 10/07/13 Potential to Achieve Goals: Good  Visit Information  Last PT Received On: 10/01/13 Assistance Needed: +1 History of Present Illness: Patient is a 73 yo female s/p Rt TKA.    Subjective Data  Subjective: Still sick on her stomach Patient Stated Goal: Wants to have a good summer   Cognition  Cognition Arousal/Alertness: Awake/alert Behavior During Therapy: WFL for tasks assessed/performed Overall Cognitive Status: Within Functional Limits for tasks assessed    Balance     End of Session PT - End of Session Equipment Utilized During Treatment: Gait belt Activity Tolerance: Patient tolerated treatment well Patient left: in chair;with call bell/phone within reach;with family/visitor present Nurse Communication: Mobility status (Nauseated)   GP     Monica Williamson 10/01/2013, 4:18 PM Monica Marion, [place] Pager [phone] Office [phone]

## 2013-10-01 NOTE — Op Note (Signed)
TOTAL KNEE REPLACEMENT OPERATIVE NOTE:  09/30/2013  1:38 PM  PATIENT:  Monica Williamson  73 y.o. female  PRE-OPERATIVE DIAGNOSIS:  osteoarthritis right knee  POST-OPERATIVE DIAGNOSIS:  osteoarthritis right knee  PROCEDURE:  Procedure(s): RIGHT TOTAL KNEE ARTHROPLASTY  SURGEON:  Surgeon(s): Vickey Huger, MD  PHYSICIAN ASSISTANT: Carlynn Spry, [facility]  ANESTHESIA:   general  DRAINS: Hemovac  SPECIMEN: None  COUNTS:  Correct  TOURNIQUET:   Total Tourniquet Time Documented: Thigh (Right) - 60 minutes Total: Thigh (Right) - 60 minutes   DICTATION:  Indication for procedure:    The patient is a 73 y.o. female who has failed conservative treatment for osteoarthritis right knee.  Informed consent was obtained prior to anesthesia. The risks versus benefits of the operation were explain and in a way the patient can, and did, understand.   On the implant demand matching protocol, this patient scored 10.  Therefore, this patient was not receive a polyethylene insert with vitamin E which is a high demand implant.  Description of procedure:     The patient was taken to the operating room and placed under anesthesia.  The patient was positioned in the usual fashion taking care that all body parts were adequately padded and/or protected.  I foley catheter was not placed.  A tourniquet was applied and the leg prepped and draped in the usual sterile fashion.  The extremity was exsanguinated with the esmarch and tourniquet inflated to 350 mmHg.  Pre-operative range of motion was normal.  The knee was in 3 degree of mild varus.  A midline incision approximately 6-7 inches long was made with a #10 blade.  A new blade was used to make a parapatellar arthrotomy going 2-3 cm into the quadriceps tendon, over the patella, and alongside the medial aspect of the patellar tendon.  A synovectomy was then performed with the #10 blade and forceps. I then elevated the deep MCL off the medial tibial  metaphysis subperiosteally around to the semimembranosus attachment.    I everted the patella and used calipers to measure patellar thickness.  I used the reamer to ream down to appropriate thickness to recreate the native thickness.  I then removed excess bone with the rongeur and sagittal saw.  I used the appropriately sized template and drilled the three lug holes.  I then put the trial in place and measured the thickness with the calipers to ensure recreation of the native thickness.  The trial was then removed and the patella subluxed and the knee brought into flexion.  A homan retractor was place to retract and protect the patella and lateral structures.  A Z-retractor was place medially to protect the medial structures.  The extra-medullary alignment system was used to make cut the tibial articular surface perpendicular to the anamotic axis of the tibia and in 3 degrees of posterior slope.  The cut surface and alignment jig was removed.  I then used the intramedullary alignment guide to make a 6 valgus cut on the distal femur.  I then marked out the epicondylar axis on the distal femur.  The posterior condylar axis measured 6 degrees.  I then used the anterior referencing sizer and measured the femur to be a size 6.  The 4-In-1 cutting block was screwed into place in external rotation matching the posterior condylar angle, making our cuts perpendicular to the epicondylar axis.  Anterior, posterior and chamfer cuts were made with the sagittal saw.  The cutting block and cut pieces were removed.  A lamina spreader was placed in 90 degrees of flexion.  The ACL, PCL, menisci, and posterior condylar osteophytes were removed.  A 11 mm spacer blocked was found to offer good flexion and extension gap balance after minimal in degree releasing.   The scoop retractor was then placed and the femoral finishing block was pinned in place.  The small sagittal saw was used as well as the lug drill to finish the femur.   The block and cut surfaces were removed and the medullary canal hole filled with autograft bone from the cut pieces.  The tibia was delivered forward in deep flexion and external rotation.  A size E tray was selected and pinned into place centered on the medial 1/3 of the tibial tubercle.  The reamer and keel was used to prepare the tibia through the tray.    I then trialed with the size 6 femur, size E tibia, a 11 mm insert and the 32 patella.  I had excellent flexion/extension gap balance, excellent patella tracking.  Flexion was full and beyond 120 degrees; extension was zero.  These components were chosen and the staff opened them to me on the back table while the knee was lavaged copiously and the cement mixed.  The soft tissue was infiltrated with 60cc of exparel 1.3% through a 21 gauge needle.  I cemented in the components and removed all excess cement.  The polyethylene tibial component was snapped into place and the knee placed in extension while cement was hardening.  The capsule was infilltrated with 30cc of .25% Marcaine with epinephrine.  A hemovac was place in the joint exiting superolaterally.  A pain pump was place superomedially superficial to the arthrotomy.  Once the cement was hard, the tourniquet was let down.  Hemostasis was obtained.  The arthrotomy was closed with figure-8 #1 vicryl sutures.  The deep soft tissues were closed with #0 vicryls and the subcuticular layer closed with a running #2-0 vicryl.  The skin was reapproximated and closed with skin staples.  The wound was dressed with xeroform, 4 x4's, 2 ABD sponges, a single layer of webril and a TED stocking.   The patient was then awakened, extubated, and taken to the recovery room in stable condition.  BLOOD LOSS:  300cc DRAINS: 1 hemovac, 1 pain catheter COMPLICATIONS:  None.  PLAN OF CARE: Admit to inpatient   PATIENT DISPOSITION:  PACU - hemodynamically stable.   Delay start of Pharmacological VTE agent (>24hrs)  due to surgical blood loss or risk of bleeding:  not applicable  Please fax a copy of this op note to my office at [phone] (please only include page 1 and 2 of the Case Information op note)

## 2013-10-02 LAB — CBC
HEMATOCRIT: 29 % — AB (ref 36.0–46.0)
Hemoglobin: 9.6 g/dL — ABNORMAL LOW (ref 12.0–15.0)
MCH: 31.4 pg (ref 26.0–34.0)
MCHC: 33.1 g/dL (ref 30.0–36.0)
MCV: 94.8 fL (ref 78.0–100.0)
PLATELETS: 230 10*3/uL (ref 150–400)
RBC: 3.06 MIL/uL — ABNORMAL LOW (ref 3.87–5.11)
RDW: 15.7 % — ABNORMAL HIGH (ref 11.5–15.5)
WBC: 9.7 10*3/uL (ref 4.0–10.5)

## 2013-10-02 MED ORDER — ONDANSETRON HCL 4 MG PO TABS: 4.0000 mg | ORAL_TABLET | Freq: Four times a day (QID) | ORAL | Status: DC | PRN

## 2013-10-02 MED ORDER — TRAMADOL HCL 50 MG PO TABS: 50.0000 mg | ORAL_TABLET | Freq: Four times a day (QID) | ORAL | Status: DC | PRN

## 2013-10-02 MED ORDER — HYDROCODONE-ACETAMINOPHEN 5-325 MG PO TABS: 1.0000 | ORAL_TABLET | ORAL | Status: DC | PRN

## 2013-10-02 NOTE — Progress Notes (Signed)
SPORTS MEDICINE AND JOINT REPLACEMENT  [name], MD   Carlynn Spry, PA-C [address], [place], [place]  [address]                             [phone]   PROGRESS NOTE  Subjective:  negative for Chest Pain  negative for Shortness of Breath  negative for Nausea/Vomiting   negative for Calf Pain  negative for Bowel Movement   Tolerating Diet: yes         Patient reports pain as 5 on 0-10 scale.    Objective: Vital signs in last 24 hours:   Patient Vitals for the past 24 hrs:  BP Temp Temp src Pulse Resp SpO2  10/02/13 0800 - - - - 16 95 %  10/02/13 0518 119/39 mmHg 98.8 F (37.1 C) Oral 76 18 95 %  10/02/13 0400 - - - - 20 -  10/02/13 0000 - - - - 18 96 %  10/01/13 2000 - - - - 18 -  10/01/13 1900 136/39 mmHg 98.4 F (36.9 C) - 76 16 99 %  10/01/13 1622 132/45 mmHg 98.8 F (37.1 C) Oral 75 18 100 %    @flow {1959:LAST@   Intake/Output from previous day:   03/17 0701 - 03/18 0700 In: 1897.5 [P.O.:1040; I.V.:857.5] Out: 20 [Drains:20]   Intake/Output this shift:   03/18 0701 - 03/18 1900 In: 240 [P.O.:240] Out: -    Intake/Output     03/17 0701 - 03/18 0700 03/18 0701 - 03/19 0700   P.O. 1040 240   I.V. (mL/kg) 857.5 (12.2)    IV Piggyback     Total Intake(mL/kg) 1897.5 (27) 240 (3.4)   Drains 20    Blood     Total Output 20     Net +1877.5 +240        Urine Occurrence 9 x 1 x      LABORATORY DATA:  Recent Labs  09/26/13 0918 10/01/13 0705 10/02/13 0541  WBC 4.1 17.2* 9.7  HGB 13.8 10.6* 9.6*  HCT 41.2 32.4* 29.0*  PLT 258 262 230    Recent Labs  09/26/13 0918 10/01/13 0705  NA 140 137  K 4.4 5.7*  CL 103 102  CO2 25 26  BUN 17 22  CREATININE 0.73 0.77  GLUCOSE 71 148*  CALCIUM 9.3 7.5*   Lab Results  Component Value Date   INR 1.00 09/30/2013   INR 1.47 09/26/2013   INR 1.2 10/05/2012    Examination:  General appearance: alert, cooperative and no distress Extremities: Homans sign is negative, no sign of  DVT  Wound Exam: clean, dry, intact   Drainage:  None: wound tissue dry  Motor Exam: EHL and FHL Intact  Sensory Exam: Deep Peroneal normal   Assessment:    2 Days Post-Op  Procedure(s) (LRB): RIGHT TOTAL KNEE ARTHROPLASTY (Right)  ADDITIONAL DIAGNOSIS:  Active Problems:   S/P total knee arthroplasty  Acute Blood Loss Anemia   Plan: Physical Therapy as ordered Weight Bearing as Tolerated (WBAT)  DVT Prophylaxis:  Xarelto  DISCHARGE PLAN: Home  DISCHARGE NEEDS: HHPT, CPM, Walker and 3-in-1 comode seat         Quron Ruddy 10/02/2013, 1:11 PM

## 2013-10-02 NOTE — Discharge Summary (Signed)
SPORTS MEDICINE & JOINT REPLACEMENT   Monica Mulch, MD   Monica Spry, PA-C [place], [place],   [address]                             [phone]  PATIENT ID: Monica Williamson        MRN:  [id number]          DOB/AGE: 1940/07/27 / 73 y.o.    DISCHARGE SUMMARY  ADMISSION DATE:    09/30/2013 DISCHARGE DATE:   10/02/2013   ADMISSION DIAGNOSIS: osteoarthritis right knee    DISCHARGE DIAGNOSIS:  osteoarthritis right knee    ADDITIONAL DIAGNOSIS: Active Problems:   S/P total knee arthroplasty  Past Medical History  Diagnosis Date  . Hypertension   . Childhood asthma   . Diverticulosis   . Vitamin D deficiency   . Depression   . Anxiety   . Pulmonary nodule   . Nocturia   . Colon polyps   . Cholelithiasis   . Atrial fibrillation     when takes Warfarin  . Dysrhythmia   . Atrial fibrillation, currently in sinus rhythm   . Bronchiectasis     contolled with lovent and allergy med  . Sleep related teeth grinding     wears a mouth guard at night  . Stress fracture of tibia 2014    PROCEDURE: Procedure(s): RIGHT TOTAL KNEE ARTHROPLASTY on 09/30/2013  CONSULTS:     HISTORY:  See H&P in chart  HOSPITAL COURSE:  Monica Williamson is a 73 y.o. admitted on 09/30/2013 and found to have a diagnosis of osteoarthritis right knee.  After appropriate laboratory studies were obtained  they were taken to the operating room on 09/30/2013 and underwent Procedure(s): RIGHT TOTAL KNEE ARTHROPLASTY.   They were given perioperative antibiotics:  Anti-infectives   Start     Dose/Rate Route Frequency Ordered Stop   09/30/13 1600  ceFAZolin (ANCEF) IVPB 1 g/50 mL premix     1 g 100 mL/hr over 30 Minutes Intravenous Every 6 hours 09/30/13 1320 09/30/13 2225   09/30/13 0600  ceFAZolin (ANCEF) IVPB 2 g/50 mL premix     2 g 100 mL/hr over 30 Minutes Intravenous On call to O.R. 09/29/13 1352 09/30/13 1001    .  Tolerated the procedure well.  Placed with a foley  intraoperatively.  Given Ofirmev at induction and for 48 hours.    POD# 1: Vital signs were stable.  Patient denied Chest pain, shortness of breath, or calf pain.  Patient was started on Lovenox 30 mg subcutaneously twice daily at 8am.  Consults to PT, OT, and care management were made.  The patient was weight bearing as tolerated.  CPM was placed on the operative leg 0-90 degrees for 6-8 hours a day.  Incentive spirometry was taught.  Dressing was changed.  Marcaine pump and hemovac were discontinued.      POD #2, Continued  PT for ambulation and exercise program.  IV saline locked.  O2 discontinued.    The remainder of the hospital course was dedicated to ambulation and strengthening.   The patient was discharged on 2 Days Post-Op in  Good condition.  Blood products given:none  DIAGNOSTIC STUDIES: Recent vital signs: Patient Vitals for the past 24 hrs:  BP Temp Temp src Pulse Resp SpO2  10/02/13 0800 - - - - 16 95 %  10/02/13 0518 119/39 mmHg 98.8 F (37.1 C) Oral 76  18 95 %  10/02/13 0400 - - - - 20 -  10/02/13 0000 - - - - 18 96 %  10/01/13 2000 - - - - 18 -  10/01/13 1900 136/39 mmHg 98.4 F (36.9 C) - 76 16 99 %  10/01/13 1622 132/45 mmHg 98.8 F (37.1 C) Oral 75 18 100 %       Recent laboratory studies:  Recent Labs  09/26/13 0918 10/01/13 0705 10/02/13 0541  WBC 4.1 17.2* 9.7  HGB 13.8 10.6* 9.6*  HCT 41.2 32.4* 29.0*  PLT 258 262 230    Recent Labs  09/26/13 0918 10/01/13 0705  NA 140 137  K 4.4 5.7*  CL 103 102  CO2 25 26  BUN 17 22  CREATININE 0.73 0.77  GLUCOSE 71 148*  CALCIUM 9.3 7.5*   Lab Results  Component Value Date   INR 1.00 09/30/2013   INR 1.47 09/26/2013   INR 1.2 10/05/2012     Recent Radiographic Studies :  Dg Chest 2 View  09/26/2013   CLINICAL DATA:  Preoperative for for right total knee prosthesis placement, history of cardiac dysrhythmia, asthma, and bronchiectasis and remote tobacco use.  EXAM: CHEST  2 VIEW  COMPARISON:  CT  CHEST W/O CM dated 03/08/2013; DG CHEST 1V PORT dated 09/21/2012  FINDINGS: The lungs are hyperinflated. There is no focal infiltrate. The cardiac silhouette is normal in size. The pulmonary vascularity is not engorged. The mediastinum is normal in width. There is no pleural effusion. The observed portions of the bony thorax appear normal.  IMPRESSION: There is hyperinflation consistent with COPD. There is no evidence of pneumonia.   Electronically Signed   By: David  Martinique   On: 09/26/2013 09:34    DISCHARGE INSTRUCTIONS: Discharge Orders   Future Orders Complete By Expires   Call MD / Call 911  As directed    Comments:     If you experience chest pain or shortness of breath, CALL 911 and be transported to the hospital emergency room.  If you develope a fever above 101 F, pus (white drainage) or increased drainage or redness at the wound, or calf pain, call your surgeon's office.   Change dressing  As directed    Comments:     Change dressing on thursday, then change the dressing daily with sterile 4 x 4 inch gauze dressing and apply TED hose.   Constipation Prevention  As directed    Comments:     Drink plenty of fluids.  Prune juice may be helpful.  You may use a stool softener, such as Colace (over the counter) 100 mg twice a day.  Use MiraLax (over the counter) for constipation as needed.   CPM  As directed    Comments:     Continuous passive motion machine (CPM):      Use the CPM from 0 to 90 for 6-8 hours per day.      You may increase by 10 per day.  You may break it up into 2 or 3 sessions per day.      Use CPM for 2 weeks or until you are told to stop.   Diet - low sodium heart healthy  As directed    Do not put a pillow under the knee. Place it under the heel.  As directed    Driving restrictions  As directed    Comments:     No driving for 6 weeks   Increase activity slowly as tolerated  As directed    Lifting restrictions  As directed    Comments:     No lifting for 6 weeks    TED hose  As directed    Comments:     Use stockings (TED hose) for 3 weeks on both leg(s).  You may remove them at night for sleeping.      DISCHARGE MEDICATIONS:     Medication List         albuterol 108 (90 BASE) MCG/ACT inhaler  Commonly known as:  PROVENTIL HFA;VENTOLIN HFA  Inhale 2 puffs into the lungs 2 (two) times daily.     ALPRAZolam 0.5 MG tablet  Commonly known as:  XANAX  Take 0.25 mg by mouth at bedtime.     aspirin 81 MG tablet  Take 1 tablet (81 mg total) by mouth daily.     buPROPion 150 MG 24 hr tablet  Commonly known as:  WELLBUTRIN XL  Take 1 tablet (150 mg total) by mouth daily.     CALCIUM 1200 1200-1000 MG-UNIT Chew  Chew 1 tablet by mouth daily.     diltiazem 180 MG 24 hr capsule  Commonly known as:  DILACOR XR  Take 1 capsule (180 mg total) by mouth daily.     fluticasone 110 MCG/ACT inhaler  Commonly known as:  FLOVENT HFA  Inhale 2 puffs into the lungs daily.     HYDROcodone-acetaminophen 5-325 MG per tablet  Commonly known as:  NORCO/VICODIN  Take 1-2 tablets by mouth every 4 (four) hours as needed for moderate pain.     loratadine 10 MG tablet  Commonly known as:  CLARITIN  Take 10 mg by mouth daily.     losartan 50 MG tablet  Commonly known as:  COZAAR  Take 1 tablet (50 mg total) by mouth daily.     methocarbamol 500 MG tablet  Commonly known as:  ROBAXIN  Take 1-2 tablets (500-1,000 mg total) by mouth every 6 (six) hours as needed for muscle spasms.     oxyCODONE 5 MG immediate release tablet  Commonly known as:  Oxy IR/ROXICODONE  Take 1-2 tablets (5-10 mg total) by mouth every 3 (three) hours as needed for breakthrough pain.     propranolol 10 MG tablet  Commonly known as:  INDERAL  Take 1 tablet (10 mg total) by mouth 4 (four) times daily as needed (FOR PALPITATIONS Can take ONE every 30 minutes Up to 4 Doses).     Vitamin D 1000 UNITS capsule  Take 1,000 Units by mouth 2 (two) times daily.     XARELTO 20 MG Tabs  tablet  Generic drug:  Rivaroxaban  Take 20 mg by mouth daily with supper.        FOLLOW UP VISIT:       Follow-up Information   Follow up with Rudean Haskell, MD. Call on 10/01/2013.   Specialty:  Orthopedic Surgery   Contact information:   [place] [address] [phone]       DISPOSITION: HOME   CONDITION:  Good   Genieve Ramaswamy 10/02/2013, 1:14 PM

## 2013-10-02 NOTE — Progress Notes (Signed)
Physical Therapy Treatment Patient Details Name: Monica Williamson MRN: [id number] DOB: [birth year] Today's Date: 10/02/2013 Time: 2993-7169 PT Time Calculation (min): 21 min  PT Assessment / Plan / Recommendation  History of Present Illness Patient is a 73 yo female s/p Rt TKA.   PT Comments   Good return demo of stairs; pt and husband are confident in their ability to manage at home; ok for dc home from PT standpoint  Follow Up Recommendations  Home health PT;Supervision/Assistance - 24 hour     Does the patient have the potential to tolerate intense rehabilitation     Barriers to Discharge        Equipment Recommendations  None recommended by PT    Recommendations for Other Services OT consult  Frequency 7X/week   Progress towards PT Goals Progress towards PT goals: Progressing toward goals  Plan Current plan remains appropriate    Precautions / Restrictions Precautions Precautions: Knee;Fall Precaution Comments: Reviewed precautions with patient and husband Restrictions RLE Weight Bearing: Weight bearing as tolerated   Pertinent Vitals/Pain Pt more concerned with urinary urgency, though quite painful with R knee flexion    Mobility  Transfers Overall transfer level: Needs assistance Equipment used: Rolling walker (2 wheeled) Transfers: Sit to/from Stand Sit to Stand: Supervision General transfer comment: Cues for safety and hand placement Ambulation/Gait Ambulation/Gait assistance: Min guard Ambulation Distance (Feet): 40 Feet Assistive device: Rolling walker (2 wheeled) Gait Pattern/deviations: Antalgic General Gait Details: Much imporved steps with noted return of R dorsiflexion and improved heel strike Stairs: Yes Stairs assistance: Min guard Stair Management: One rail Right Number of Stairs: 3 General stair comments: Demonstrated 3 different technqiues for pt and husband to practice; they demonstrated the technique with R rail and L handheld assist  provided by husband well    Exercises Total Joint Exercises Ankle Circles/Pumps: AROM;Both;10 reps   PT Diagnosis:    PT Problem List:   PT Treatment Interventions:     PT Goals (current goals can now be found in the care plan section) Acute Rehab PT Goals PT Goal Formulation: With patient/family Time For Goal Achievement: 10/07/13 Potential to Achieve Goals: Good  Visit Information  Last PT Received On: 10/02/13 Assistance Needed: +1 History of Present Illness: Patient is a 73 yo female s/p Rt TKA.    Subjective Data  Subjective: Feeling better, hopeful for home   Cognition  Cognition Arousal/Alertness: Awake/alert Behavior During Therapy: WFL for tasks assessed/performed Overall Cognitive Status: Within Functional Limits for tasks assessed    Balance     End of Session PT - End of Session Activity Tolerance: Patient tolerated treatment well Patient left: with call bell/phone within reach;with family/visitor present (on commode in bathroom) Nurse Communication: Mobility status   GP     Roney Marion [facility] 10/02/2013, 4:33 PM Roney Marion, [place] Pager [phone] Office [phone]

## 2013-10-02 NOTE — Progress Notes (Signed)
Patient discharged to home accompanied by family. Discharge instructions and rx given and explained and patient stated understanding. IV was removed and patient left unit in a stable condition with all personal belongings via wheelchair.  

## 2013-10-02 NOTE — Progress Notes (Signed)
Occupational Therapy Treatment Patient Details Name: Monica Williamson MRN: [id number] DOB: 1941/01/13 Today's Date: 10/02/2013 Time: 7001-7494 OT Time Calculation (min): 23 min  OT Assessment / Plan / Recommendation  History of present illness Patient is a 73 yo female s/p Rt TKA.   OT comments  Pt progressing well toward goal. Pt performed shower transfer min guard with rw and 3n1 using sidestep transfer. Educated patient on techniques for safely performing shower transfers with knee precautions. D/c plan remains appropriate.   Follow Up Recommendations  No OT follow up    Barriers to Discharge       Equipment Recommendations  3 in 1 bedside comode    Recommendations for Other Services    Frequency Min 2X/week   Progress towards OT Goals Progress towards OT goals: Progressing toward goals  Plan Discharge plan remains appropriate    Precautions / Restrictions Precautions Precautions: Knee;Fall Restrictions Weight Bearing Restrictions: Yes RLE Weight Bearing: Weight bearing as tolerated   Pertinent Vitals/Pain Pt reports 4-5/10 pain. Increased activity during session.    ADL  Tub/Shower Transfer: Min guard Tub/Shower Transfer Method: Ambulating ADL Comments: Pt in chair upon arrival, reports improvement with nausea. Pt completed side step shower transfer, education on safety during shower transfers with knee precautions and to have husband nearby for shower transfers. Min A for supporting affected LE for EOB to supine, demonstrated using a sheet to support leg during transfer, pt stating husband will help her with that at home.    OT Diagnosis:    OT Problem List:   OT Treatment Interventions:     OT Goals(current goals can now be found in the care plan section) Acute Rehab OT Goals OT Goal Formulation: With patient/family Time For Goal Achievement: 10/08/13 Potential to Achieve Goals: Good ADL Goals Pt Will Perform Tub/Shower Transfer: Shower transfer;with min  guard assist;ambulating;3 in 1;rolling walker  Visit Information  Last OT Received On: 10/02/13 Assistance Needed: +1 History of Present Illness: Patient is a 73 yo female s/p Rt TKA.    Subjective Data      Prior Functioning       Cognition  Cognition Arousal/Alertness: Awake/alert Behavior During Therapy: WFL for tasks assessed/performed Overall Cognitive Status: Within Functional Limits for tasks assessed    Mobility  Transfers Overall transfer level: Needs assistance Equipment used: Rolling walker (2 wheeled) Transfers: Sit to/from Stand Sit to Stand: Min guard    Exercises      Balance    End of Session OT - End of Session Equipment Utilized During Treatment: Gait belt;Rolling walker Activity Tolerance: Patient tolerated treatment well Patient left: in bed;with call bell/phone within reach CPM Right Knee CPM Right Knee: Off  Wells OTR/L Pager: [phone]  10/02/2013, 11:51 AM

## 2013-10-02 NOTE — Progress Notes (Signed)
Physical Therapy Treatment Patient Details Name: MIAMARIE MOLL MRN: [id number] DOB: 1941/01/17 Today's Date: 10/02/2013 Time: 1950-9326 PT Time Calculation (min): 29 min  PT Assessment / Plan / Recommendation  History of Present Illness Patient is a 73 yo female s/p Rt TKA.   PT Comments   Painful today in R stance making advancing LLE up steps difficult; Will plan to continue stair training in pm session to give pt and husband more confidence; Still on track for dc home today  Follow Up Recommendations  Home health PT;Supervision/Assistance - 24 hour     Does the patient have the potential to tolerate intense rehabilitation     Barriers to Discharge        Equipment Recommendations  None recommended by PT    Recommendations for Other Services OT consult  Frequency 7X/week   Progress towards PT Goals Progress towards PT goals: Progressing toward goals  Plan Current plan remains appropriate    Precautions / Restrictions Precautions Precautions: Knee;Fall Precaution Comments: Reviewed precautions with patient and husband Restrictions Weight Bearing Restrictions: Yes RLE Weight Bearing: Weight bearing as tolerated   Pertinent Vitals/Pain 9/10 pain R knee with stair training patient repositioned for comfort and Optimal knee extension     Mobility  Transfers Overall transfer level: Needs assistance Equipment used: Rolling walker (2 wheeled) Transfers: Sit to/from Stand Sit to Stand: Supervision General transfer comment: Cues for safety and hand placement Ambulation/Gait Ambulation/Gait assistance: Min guard Ambulation Distance (Feet): 10 Feet Assistive device: Rolling walker (2 wheeled) Gait Pattern/deviations: Antalgic General Gait Details: Much imporved steps with noted return of R dorsiflexion and improved heel strike Stairs: Yes Stairs assistance: Min guard Stair Management: One rail Right;Sideways;Step to pattern Number of Stairs: 3 General stair  comments: Verbal and demo cues for technique; Very painful with advancement of LLE up -- so painful, and pt became nauseated with some vomiting; RN notified    Exercises Total Joint Exercises Ankle Circles/Pumps: AROM;Both;10 reps   PT Diagnosis:    PT Problem List:   PT Treatment Interventions:     PT Goals (current goals can now be found in the care plan section) Acute Rehab PT Goals PT Goal Formulation: With patient/family Time For Goal Achievement: 10/07/13 Potential to Achieve Goals: Good  Visit Information  Last PT Received On: 10/02/13 Assistance Needed: +1 History of Present Illness: Patient is a 73 yo female s/p Rt TKA.    Subjective Data  Subjective: Feeling better, hopeful for home   Cognition  Cognition Arousal/Alertness: Awake/alert Behavior During Therapy: WFL for tasks assessed/performed Overall Cognitive Status: Within Functional Limits for tasks assessed    Balance     End of Session PT - End of Session Activity Tolerance: Patient tolerated treatment well;Other (comment) (but limited by nausea) Patient left: in chair;with call bell/phone within reach;with family/visitor present Nurse Communication: Mobility status   GP     Roney Marion Hamff 10/02/2013, 1:43 PM Roney Marion, [place] Pager [phone] Office [phone]

## 2013-10-03 DIAGNOSIS — Z4801 Encounter for change or removal of surgical wound dressing: Secondary | ICD-10-CM | POA: Diagnosis not present

## 2013-10-03 DIAGNOSIS — IMO0001 Reserved for inherently not codable concepts without codable children: Secondary | ICD-10-CM | POA: Diagnosis not present

## 2013-10-03 DIAGNOSIS — Z7901 Long term (current) use of anticoagulants: Secondary | ICD-10-CM | POA: Diagnosis not present

## 2013-10-03 DIAGNOSIS — Z471 Aftercare following joint replacement surgery: Secondary | ICD-10-CM | POA: Diagnosis not present

## 2013-10-03 DIAGNOSIS — I10 Essential (primary) hypertension: Secondary | ICD-10-CM | POA: Diagnosis not present

## 2013-10-03 DIAGNOSIS — Z96659 Presence of unspecified artificial knee joint: Secondary | ICD-10-CM | POA: Diagnosis not present

## 2013-10-04 ENCOUNTER — Telehealth: Payer: Self-pay | Admitting: Cardiovascular Disease

## 2013-10-04 NOTE — Telephone Encounter (Signed)
Knee replacement 09/30/13  Pain 5/10 and anxious because she has PT this afternoon. States bp/p all over the place. Pt has taken readings 5 x in the last 3 hrs. 129/83  p148 161/96  p78 148/70   p76 135/57   p75 157/110   p 109 Pt states she feels fine other that the knee pain/ c/o no appetite, poorly managed pain due to nausea from anesthesia but zofran helps. Pt is on Xarelto and states she just wanted to update you. Pt was reassured,  pain/anxiety is contributing to elevated rates//told to use ice/ medicate 1 hour prior to PT, continue to monitor her bp/p 3 times a day And to call with questions or concerns.  Pt agreed to plan.

## 2013-10-04 NOTE — Telephone Encounter (Signed)
New message     Pt had total knee replacement on Monday---her pulse has been "all over the place".  Pt says she is in AFIB

## 2013-10-06 ENCOUNTER — Other Ambulatory Visit: Payer: Self-pay | Admitting: Nurse Practitioner

## 2013-10-08 ENCOUNTER — Other Ambulatory Visit: Payer: Self-pay | Admitting: Cardiovascular Disease

## 2013-10-15 DIAGNOSIS — Z96659 Presence of unspecified artificial knee joint: Secondary | ICD-10-CM | POA: Diagnosis not present

## 2013-10-15 DIAGNOSIS — Z471 Aftercare following joint replacement surgery: Secondary | ICD-10-CM | POA: Diagnosis not present

## 2013-10-21 DIAGNOSIS — Z96659 Presence of unspecified artificial knee joint: Secondary | ICD-10-CM | POA: Diagnosis not present

## 2013-10-21 DIAGNOSIS — R262 Difficulty in walking, not elsewhere classified: Secondary | ICD-10-CM | POA: Diagnosis not present

## 2013-10-21 DIAGNOSIS — M25569 Pain in unspecified knee: Secondary | ICD-10-CM | POA: Diagnosis not present

## 2013-10-21 DIAGNOSIS — M25669 Stiffness of unspecified knee, not elsewhere classified: Secondary | ICD-10-CM | POA: Diagnosis not present

## 2013-10-25 ENCOUNTER — Telehealth: Payer: Self-pay | Admitting: Cardiovascular Disease

## 2013-10-25 NOTE — Telephone Encounter (Signed)
New message     Pt is on xarelto.  She is hearing bad things about it and want to discuss being on xarelto.

## 2013-10-25 NOTE — Telephone Encounter (Signed)
I spoke with the pt's husband and the pt is currently not at home.  He will have the pt contact our office when she comes home.

## 2013-10-25 NOTE — Telephone Encounter (Signed)
Follow up  ° ° °Patient calling back to speak with nurse  °

## 2013-10-25 NOTE — Telephone Encounter (Signed)
Spoke with patient and she was calling because she has been seeing commercials on t.v., and reading news articles that Xarelto is a harmful drug and to contact your doctor if your on it. Advised Pt that Xarelto, like other blood thinners run the same risks, and that Xarelto is FDA approved and has not been discontinued. Advised Pt. To continue with current treatment regimen, and to contact us with any further concerns or needs. Pt is pleased with this and in understanding.

## 2013-11-07 DIAGNOSIS — M25569 Pain in unspecified knee: Secondary | ICD-10-CM | POA: Diagnosis not present

## 2013-11-07 DIAGNOSIS — M1710 Unilateral primary osteoarthritis, unspecified knee: Secondary | ICD-10-CM | POA: Diagnosis not present

## 2013-11-07 DIAGNOSIS — IMO0002 Reserved for concepts with insufficient information to code with codable children: Secondary | ICD-10-CM | POA: Diagnosis not present

## 2013-11-19 DIAGNOSIS — E559 Vitamin D deficiency, unspecified: Secondary | ICD-10-CM | POA: Diagnosis not present

## 2013-11-19 DIAGNOSIS — I10 Essential (primary) hypertension: Secondary | ICD-10-CM | POA: Diagnosis not present

## 2013-11-19 DIAGNOSIS — M949 Disorder of cartilage, unspecified: Secondary | ICD-10-CM | POA: Diagnosis not present

## 2013-11-19 DIAGNOSIS — M899 Disorder of bone, unspecified: Secondary | ICD-10-CM | POA: Diagnosis not present

## 2013-11-19 DIAGNOSIS — D649 Anemia, unspecified: Secondary | ICD-10-CM | POA: Diagnosis not present

## 2013-11-21 DIAGNOSIS — K219 Gastro-esophageal reflux disease without esophagitis: Secondary | ICD-10-CM | POA: Diagnosis not present

## 2013-11-21 DIAGNOSIS — M899 Disorder of bone, unspecified: Secondary | ICD-10-CM | POA: Diagnosis not present

## 2013-11-21 DIAGNOSIS — J45909 Unspecified asthma, uncomplicated: Secondary | ICD-10-CM | POA: Diagnosis not present

## 2013-11-21 DIAGNOSIS — D649 Anemia, unspecified: Secondary | ICD-10-CM | POA: Diagnosis not present

## 2013-11-21 DIAGNOSIS — Z79899 Other long term (current) drug therapy: Secondary | ICD-10-CM | POA: Diagnosis not present

## 2013-11-21 DIAGNOSIS — Z1331 Encounter for screening for depression: Secondary | ICD-10-CM | POA: Diagnosis not present

## 2013-11-21 DIAGNOSIS — I10 Essential (primary) hypertension: Secondary | ICD-10-CM | POA: Diagnosis not present

## 2013-11-21 DIAGNOSIS — I4891 Unspecified atrial fibrillation: Secondary | ICD-10-CM | POA: Diagnosis not present

## 2013-11-21 DIAGNOSIS — E559 Vitamin D deficiency, unspecified: Secondary | ICD-10-CM | POA: Diagnosis not present

## 2013-11-21 DIAGNOSIS — M949 Disorder of cartilage, unspecified: Secondary | ICD-10-CM | POA: Diagnosis not present

## 2013-11-21 DIAGNOSIS — Z0000 Encounter for general adult medical examination without abnormal findings: Secondary | ICD-10-CM | POA: Diagnosis not present

## 2013-11-22 DIAGNOSIS — Z1212 Encounter for screening for malignant neoplasm of rectum: Secondary | ICD-10-CM | POA: Diagnosis not present

## 2013-11-29 DIAGNOSIS — M25669 Stiffness of unspecified knee, not elsewhere classified: Secondary | ICD-10-CM | POA: Diagnosis not present

## 2013-11-29 DIAGNOSIS — M25569 Pain in unspecified knee: Secondary | ICD-10-CM | POA: Diagnosis not present

## 2013-11-29 DIAGNOSIS — R262 Difficulty in walking, not elsewhere classified: Secondary | ICD-10-CM | POA: Diagnosis not present

## 2013-11-29 DIAGNOSIS — Z96659 Presence of unspecified artificial knee joint: Secondary | ICD-10-CM | POA: Diagnosis not present

## 2013-12-02 DIAGNOSIS — Z96659 Presence of unspecified artificial knee joint: Secondary | ICD-10-CM | POA: Diagnosis not present

## 2013-12-02 DIAGNOSIS — R262 Difficulty in walking, not elsewhere classified: Secondary | ICD-10-CM | POA: Diagnosis not present

## 2013-12-02 DIAGNOSIS — M25669 Stiffness of unspecified knee, not elsewhere classified: Secondary | ICD-10-CM | POA: Diagnosis not present

## 2013-12-02 DIAGNOSIS — M25569 Pain in unspecified knee: Secondary | ICD-10-CM | POA: Diagnosis not present

## 2013-12-13 DIAGNOSIS — M25569 Pain in unspecified knee: Secondary | ICD-10-CM | POA: Diagnosis not present

## 2013-12-13 DIAGNOSIS — M25669 Stiffness of unspecified knee, not elsewhere classified: Secondary | ICD-10-CM | POA: Diagnosis not present

## 2013-12-13 DIAGNOSIS — R262 Difficulty in walking, not elsewhere classified: Secondary | ICD-10-CM | POA: Diagnosis not present

## 2013-12-13 DIAGNOSIS — Z96659 Presence of unspecified artificial knee joint: Secondary | ICD-10-CM | POA: Diagnosis not present

## 2013-12-19 DIAGNOSIS — M25569 Pain in unspecified knee: Secondary | ICD-10-CM | POA: Diagnosis not present

## 2013-12-19 DIAGNOSIS — R262 Difficulty in walking, not elsewhere classified: Secondary | ICD-10-CM | POA: Diagnosis not present

## 2013-12-19 DIAGNOSIS — Z96659 Presence of unspecified artificial knee joint: Secondary | ICD-10-CM | POA: Diagnosis not present

## 2013-12-19 DIAGNOSIS — M25669 Stiffness of unspecified knee, not elsewhere classified: Secondary | ICD-10-CM | POA: Diagnosis not present

## 2013-12-26 DIAGNOSIS — Z96659 Presence of unspecified artificial knee joint: Secondary | ICD-10-CM | POA: Diagnosis not present

## 2013-12-26 DIAGNOSIS — M25569 Pain in unspecified knee: Secondary | ICD-10-CM | POA: Diagnosis not present

## 2013-12-26 DIAGNOSIS — M25669 Stiffness of unspecified knee, not elsewhere classified: Secondary | ICD-10-CM | POA: Diagnosis not present

## 2013-12-26 DIAGNOSIS — R262 Difficulty in walking, not elsewhere classified: Secondary | ICD-10-CM | POA: Diagnosis not present

## 2013-12-29 ENCOUNTER — Telehealth: Payer: Self-pay | Admitting: Physician Assistant

## 2013-12-29 DIAGNOSIS — I4891 Unspecified atrial fibrillation: Secondary | ICD-10-CM

## 2013-12-29 MED ORDER — PROPRANOLOL HCL 10 MG PO TABS: 10.0000 mg | ORAL_TABLET | Freq: Four times a day (QID) | ORAL | Status: DC | PRN

## 2013-12-29 MED ORDER — DILTIAZEM HCL ER 180 MG PO CP24: 180.0000 mg | ORAL_CAPSULE | Freq: Every day | ORAL | Status: DC

## 2013-12-29 NOTE — Telephone Encounter (Signed)
Monica Williamson is a 73 y.o. female with a hx PAF. Patient left town and forgot all medications. She has her Xarelto. She requests a Rx for Diltiazem and Propranolol. These were sent to the pharmacy in [place], [place]. Richardson Dopp, PA-C   12/29/2013 2:04 PM

## 2014-01-20 DIAGNOSIS — H43399 Other vitreous opacities, unspecified eye: Secondary | ICD-10-CM | POA: Diagnosis not present

## 2014-01-20 DIAGNOSIS — H2510 Age-related nuclear cataract, unspecified eye: Secondary | ICD-10-CM | POA: Diagnosis not present

## 2014-01-20 DIAGNOSIS — H4320 Crystalline deposits in vitreous body, unspecified eye: Secondary | ICD-10-CM | POA: Diagnosis not present

## 2014-01-21 ENCOUNTER — Other Ambulatory Visit: Payer: Self-pay | Admitting: Orthopedic Surgery

## 2014-01-21 DIAGNOSIS — M1710 Unilateral primary osteoarthritis, unspecified knee: Secondary | ICD-10-CM | POA: Diagnosis not present

## 2014-01-21 DIAGNOSIS — IMO0002 Reserved for concepts with insufficient information to code with codable children: Secondary | ICD-10-CM | POA: Diagnosis not present

## 2014-02-17 ENCOUNTER — Encounter (HOSPITAL_COMMUNITY): Payer: Self-pay

## 2014-02-17 ENCOUNTER — Encounter (HOSPITAL_COMMUNITY)
Admission: RE | Admit: 2014-02-17 | Discharge: 2014-02-17 | Disposition: A | Discharge status: Home / Self Care | Payer: Medicare Other | Source: Ambulatory Visit | Attending: Orthopedic Surgery | Admitting: Orthopedic Surgery

## 2014-02-17 DIAGNOSIS — I4891 Unspecified atrial fibrillation: Secondary | ICD-10-CM | POA: Insufficient documentation

## 2014-02-17 DIAGNOSIS — Z87891 Personal history of nicotine dependence: Secondary | ICD-10-CM | POA: Insufficient documentation

## 2014-02-17 DIAGNOSIS — Z01818 Encounter for other preprocedural examination: Secondary | ICD-10-CM | POA: Diagnosis not present

## 2014-02-17 DIAGNOSIS — I10 Essential (primary) hypertension: Secondary | ICD-10-CM | POA: Diagnosis not present

## 2014-02-17 DIAGNOSIS — Z01812 Encounter for preprocedural laboratory examination: Secondary | ICD-10-CM | POA: Diagnosis not present

## 2014-02-17 HISTORY — DX: Personal history of other diseases of the respiratory system: Z87.09

## 2014-02-17 HISTORY — DX: Other specified disorders of bone density and structure, unspecified site: M85.80

## 2014-02-17 HISTORY — DX: Unspecified osteoarthritis, unspecified site: M19.90

## 2014-02-17 HISTORY — DX: Other allergy status, other than to drugs and biological substances: Z91.09

## 2014-02-17 HISTORY — DX: Other specified postprocedural states: Z98.890

## 2014-02-17 HISTORY — DX: Adverse effect of unspecified anesthetic, initial encounter: T41.45XA

## 2014-02-17 HISTORY — DX: Other specified postprocedural states: R11.2

## 2014-02-17 HISTORY — DX: Other complications of anesthesia, initial encounter: T88.59XA

## 2014-02-17 LAB — CBC WITH DIFFERENTIAL/PLATELET
Basophils Absolute: 0 10*3/uL (ref 0.0–0.1)
Basophils Relative: 0 % (ref 0–1)
EOS PCT: 5 % (ref 0–5)
Eosinophils Absolute: 0.3 10*3/uL (ref 0.0–0.7)
HEMATOCRIT: 37.4 % (ref 36.0–46.0)
HEMOGLOBIN: 11.9 g/dL — AB (ref 12.0–15.0)
Lymphocytes Relative: 13 % (ref 12–46)
Lymphs Abs: 0.9 10*3/uL (ref 0.7–4.0)
MCH: 28.7 pg (ref 26.0–34.0)
MCHC: 31.8 g/dL (ref 30.0–36.0)
MCV: 90.3 fL (ref 78.0–100.0)
MONOS PCT: 17 % — AB (ref 3–12)
Monocytes Absolute: 1.2 10*3/uL — ABNORMAL HIGH (ref 0.1–1.0)
NEUTROS ABS: 4.8 10*3/uL (ref 1.7–7.7)
Neutrophils Relative %: 65 % (ref 43–77)
Platelets: 287 10*3/uL (ref 150–400)
RBC: 4.14 MIL/uL (ref 3.87–5.11)
RDW: 18.5 % — ABNORMAL HIGH (ref 11.5–15.5)
WBC: 7.3 10*3/uL (ref 4.0–10.5)

## 2014-02-17 LAB — COMPREHENSIVE METABOLIC PANEL
ALT: 14 U/L (ref 0–35)
ANION GAP: 11 (ref 5–15)
AST: 16 U/L (ref 0–37)
Albumin: 3.6 g/dL (ref 3.5–5.2)
Alkaline Phosphatase: 87 U/L (ref 39–117)
BILIRUBIN TOTAL: 0.4 mg/dL (ref 0.3–1.2)
BUN: 14 mg/dL (ref 6–23)
CALCIUM: 9.5 mg/dL (ref 8.4–10.5)
CHLORIDE: 103 meq/L (ref 96–112)
CO2: 27 meq/L (ref 19–32)
CREATININE: 0.75 mg/dL (ref 0.50–1.10)
GFR calc Af Amer: >90 mL/min (ref >90)
GFR, EST NON AFRICAN AMERICAN: 81 mL/min — AB (ref >90)
Glucose, Bld: 90 mg/dL (ref 70–99)
Potassium: 5 mEq/L (ref 3.7–5.3)
Sodium: 141 mEq/L (ref 137–147)
Total Protein: 7 g/dL (ref 6.0–8.3)

## 2014-02-17 LAB — TYPE AND SCREEN
ABO/RH(D): O POS
Antibody Screen: NEGATIVE

## 2014-02-17 LAB — URINE MICROSCOPIC-ADD ON

## 2014-02-17 LAB — URINALYSIS, ROUTINE W REFLEX MICROSCOPIC
BILIRUBIN URINE: NEGATIVE
Glucose, UA: NEGATIVE mg/dL
HGB URINE DIPSTICK: NEGATIVE
Ketones, ur: NEGATIVE mg/dL
Nitrite: NEGATIVE
PROTEIN: NEGATIVE mg/dL
Specific Gravity, Urine: 1.015 (ref 1.005–1.030)
UROBILINOGEN UA: 0.2 mg/dL (ref 0.0–1.0)
pH: 5.5 (ref 5.0–8.0)

## 2014-02-17 LAB — SURGICAL PCR SCREEN
MRSA, PCR: NEGATIVE
Staphylococcus aureus: NEGATIVE

## 2014-02-17 LAB — PROTIME-INR
INR: 1.47 (ref 0.00–1.49)
Prothrombin Time: 17.8 seconds — ABNORMAL HIGH (ref 11.6–15.2)

## 2014-02-17 LAB — APTT: APTT: 52 s — AB (ref 24–37)

## 2014-02-17 NOTE — Pre-Procedure Instructions (Signed)
RAYLYNNE CUBBAGE  02/17/2014   Your procedure is scheduled on:  Monday, August 10  Report to [facility] Admitting at 0800 AM.  Call this number if you have problems the morning of surgery: [phone]   Remember:   Do not eat food or drink liquids after midnight.Sunday night   Take these medicines the morning of surgery with A SIP OF WATER: Proventil inhaler,Wellbutrin,diltiazem,Flovent,Propranolol if needed   Do not wear jewelry, make-up or nail polish.  Do not wear lotions, powders, or perfumes. You may wear deodorant.  Do not shave 48 hours prior to surgery.    Do not bring valuables to the hospital.  [facility] is not responsible    for any belongings or valuables.               Contacts, dentures or bridgework may not be worn into surgery.  Leave suitcase in the car. After surgery it may be brought to your room.  For patients admitted to the hospital, discharge time is determined by your treatment team.     Special Instructions: [facility] - Preparing for Surgery  Before surgery, you can play an important role.  Because skin is not sterile, your skin needs to be as free of germs as possible.  You can reduce the number of germs on you skin by washing with CHG (chlorahexidine gluconate) soap before surgery.  CHG is an antiseptic cleaner which kills germs and bonds with the skin to continue killing germs even after washing.  Please DO NOT use if you have an allergy to CHG or antibacterial soaps.  If your skin becomes reddened/irritated stop using the CHG and inform your nurse when you arrive at Short Stay.  Do not shave (including legs and underarms) for at least 48 hours prior to the first CHG shower.  You may shave your face.  Please follow these instructions carefully:   1.  Shower with CHG Soap the night before surgery and the   morning of Surgery.  2.  If you choose to wash your hair, wash your hair first as usual with your normal shampoo.  3.  After you  shampoo, rinse your hair and body thoroughly to remove the  Shampoo.  4.  Use CHG as you would any other liquid soap.  You can apply chg directly  to the skin and wash gently with scrungie or a clean washcloth.  5.  Apply the CHG Soap to your body ONLY FROM THE NECK DOWN.   Do not use on open wounds or open sores.  Avoid contact with your eyes,  ears, mouth and genitals (private parts).  Wash genitals (private parts)   with your normal soap.  6.  Wash thoroughly, paying special attention to the area where your surgery    will be performed.  7.  Thoroughly rinse your body with warm water from the neck down.  8.  DO NOT shower/wash with your normal soap after using and rinsing off  the CHG Soap.  9.  Pat yourself dry with a clean towel.            10.  Wear clean pajamas.            11 .  Place clean sheets on your bed the night of your first shower and do not  sleep with pets.  Day of Surgery  Do not apply any lotions/deoderants the morning of surgery.  Please wear clean clothes to the hospital/surgery center.  Please read over the following fact sheets that you were given: Pain Booklet, Coughing and Deep Breathing, Blood Transfusion Information, Total Joint Packet and Surgical Site Infection Prevention

## 2014-02-18 LAB — URINE CULTURE: Colony Count: 8000

## 2014-02-18 NOTE — Progress Notes (Signed)
Anesthesia chart review: Patient is a 73 year old female scheduled for left TKA on 02/24/14 by Dr. Ronnie Derby.  History includes afib/PAF, hypertension, former smoker, childhood asthma, diverticulosis, anxiety, depression, pulmonary nodule, bronchiectasis with question of MAC (treated with Flovent, albuterol, and loratadine), cholecystectomy, hysterectomy, right TKA on 09/30/13. Cardiologist is Dr. Acie Fredrickson who cleared her prior to her right TKA in March 2015. PCP Dr. Marton Redwood.  Pulmonologist is Dr. Cresenciano Lick at [facility] ([place]), last visit 07/01/13.  She was doing well at that time with plans to management her bronchiectasis conservatively.   EKG on 09/26/13 showed SR with short PR. HR was 70 bpm at PAT.   Echo on 11/08/10 showed:  - Left ventricle: The cavity size was normal. Systolic function was normal. The estimated ejection fraction was in the range of 60% to 65%. Wall motion was normal; there were no regional wall motion abnormalities. - Mitral valve: Calcified annulus. Mild regurgitation. - Atrial septum: No defect or patent foramen ovale was identified. - Pulmonic valve: Trivial regurgitation.  - Tricuspid valve: Trivial regurgitation.  She had a normal stress echo that same date.   CXR on 09/26/13 showed: There is hyperinflation consistent with COPD. There is no evidence of pneumonia.   Preoperative labs noted. Her PT/PTT are elevated at 17.8 and 52. INR 1.47. Xarelto was held starting 02/17/14.  PT/PTT on arrival. Urine culture showed insignificant growth.    Pulmonary Function Test 02/28/2013 (Care Everywhere) showed:  FVC PRE 3.80  FVC % PRE PRED 120  FEV1 PRE 2.29  FEV1 % PRE PRED 98  FEV1/FVC PRE 60  TLC PRE 6.45  TLC % PRE PRED 117  RV PRE 2.65  RV % PRE PRED 110  DLCO PRE 15.3  DLCO % PRE PRED 85   Patient tolerated similar procedure six months ago.  It appears she had an uneventful post-operative course. She'll get a PT/PTT on arrival to ensure results are  acceptable. Further evaluation on the day of surgery by her assigned anesthesiologist to ensure no recurrent arrhythmias or acute respiratory issues. If stable then I would anticipate that she could proceed as planned. She'll need aggressive pulmonary toilet post-operatively. Could even consider pulmonary post-operative consultation with her bronchiectasis history, but will defer to Dr. Ronnie Derby (not required following her last surgery).  George Hugh [facility] Short Stay Center/Anesthesiology Phone [phone] 02/18/2014 2:17 PM

## 2014-02-23 MED ORDER — BUPIVACAINE LIPOSOME 1.3 % IJ SUSP
20.0000 mL | Freq: Once | INTRAMUSCULAR | Status: DC
  Filled 2014-02-23: qty 20

## 2014-02-23 MED ORDER — CEFAZOLIN SODIUM-DEXTROSE 2-3 GM-% IV SOLR
2.0000 g | INTRAVENOUS | Status: AC
  Administered 2014-02-24: 2 g via INTRAVENOUS
  Filled 2014-02-23: qty 50

## 2014-02-23 MED ORDER — CHLORHEXIDINE GLUCONATE 4 % EX LIQD
60.0000 mL | Freq: Once | CUTANEOUS | Status: DC
  Filled 2014-02-23: qty 60

## 2014-02-23 MED ORDER — TRANEXAMIC ACID 100 MG/ML IV SOLN
1000.0000 mg | INTRAVENOUS | Status: AC
  Administered 2014-02-24: 1000 mg via INTRAVENOUS
  Filled 2014-02-23: qty 10

## 2014-02-23 MED ORDER — SODIUM CHLORIDE 0.9 % IV SOLN: INTRAVENOUS | Status: DC

## 2014-02-24 ENCOUNTER — Encounter (HOSPITAL_COMMUNITY)
Admission: RE | Disposition: A | Discharge status: Home Health | Payer: Self-pay | Source: Ambulatory Visit | Attending: Orthopedic Surgery

## 2014-02-24 ENCOUNTER — Inpatient Hospital Stay (HOSPITAL_COMMUNITY): Payer: Medicare Other | Admitting: Certified Registered Nurse Anesthetist

## 2014-02-24 ENCOUNTER — Inpatient Hospital Stay (HOSPITAL_COMMUNITY)
Admission: RE | Admit: 2014-02-24 | Discharge: 2014-02-25 | DRG: 470 | Disposition: A | Discharge status: Home Health | Payer: Medicare Other | Source: Ambulatory Visit | Attending: Orthopedic Surgery | Admitting: Orthopedic Surgery

## 2014-02-24 ENCOUNTER — Encounter (HOSPITAL_COMMUNITY): Payer: Self-pay | Admitting: *Deleted

## 2014-02-24 ENCOUNTER — Encounter (HOSPITAL_COMMUNITY): Payer: Medicare Other | Admitting: Vascular Surgery

## 2014-02-24 DIAGNOSIS — Z87891 Personal history of nicotine dependence: Secondary | ICD-10-CM | POA: Diagnosis not present

## 2014-02-24 DIAGNOSIS — M1710 Unilateral primary osteoarthritis, unspecified knee: Secondary | ICD-10-CM | POA: Diagnosis present

## 2014-02-24 DIAGNOSIS — I10 Essential (primary) hypertension: Secondary | ICD-10-CM | POA: Diagnosis present

## 2014-02-24 DIAGNOSIS — I4891 Unspecified atrial fibrillation: Secondary | ICD-10-CM | POA: Diagnosis present

## 2014-02-24 DIAGNOSIS — E559 Vitamin D deficiency, unspecified: Secondary | ICD-10-CM | POA: Diagnosis present

## 2014-02-24 DIAGNOSIS — F411 Generalized anxiety disorder: Secondary | ICD-10-CM | POA: Diagnosis present

## 2014-02-24 DIAGNOSIS — Z7901 Long term (current) use of anticoagulants: Secondary | ICD-10-CM | POA: Diagnosis not present

## 2014-02-24 DIAGNOSIS — D62 Acute posthemorrhagic anemia: Secondary | ICD-10-CM | POA: Diagnosis not present

## 2014-02-24 DIAGNOSIS — J45909 Unspecified asthma, uncomplicated: Secondary | ICD-10-CM | POA: Diagnosis present

## 2014-02-24 DIAGNOSIS — F3289 Other specified depressive episodes: Secondary | ICD-10-CM | POA: Diagnosis present

## 2014-02-24 DIAGNOSIS — F329 Major depressive disorder, single episode, unspecified: Secondary | ICD-10-CM | POA: Diagnosis present

## 2014-02-24 DIAGNOSIS — Z888 Allergy status to other drugs, medicaments and biological substances status: Secondary | ICD-10-CM

## 2014-02-24 DIAGNOSIS — Z96652 Presence of left artificial knee joint: Secondary | ICD-10-CM

## 2014-02-24 DIAGNOSIS — M25569 Pain in unspecified knee: Secondary | ICD-10-CM | POA: Diagnosis not present

## 2014-02-24 DIAGNOSIS — Z96659 Presence of unspecified artificial knee joint: Secondary | ICD-10-CM | POA: Diagnosis not present

## 2014-02-24 DIAGNOSIS — M949 Disorder of cartilage, unspecified: Secondary | ICD-10-CM | POA: Diagnosis present

## 2014-02-24 DIAGNOSIS — M899 Disorder of bone, unspecified: Secondary | ICD-10-CM | POA: Diagnosis present

## 2014-02-24 DIAGNOSIS — G8918 Other acute postprocedural pain: Secondary | ICD-10-CM | POA: Diagnosis not present

## 2014-02-24 HISTORY — PX: TOTAL KNEE ARTHROPLASTY: SHX125

## 2014-02-24 LAB — PROTIME-INR
INR: 0.89 (ref 0.00–1.49)
PROTHROMBIN TIME: 12.1 s (ref 11.6–15.2)

## 2014-02-24 LAB — APTT: APTT: 34 s (ref 24–37)

## 2014-02-24 SURGERY — ARTHROPLASTY, KNEE, TOTAL
Anesthesia: General | Laterality: Left

## 2014-02-24 MED ORDER — SENNOSIDES-DOCUSATE SODIUM 8.6-50 MG PO TABS: 1.0000 | ORAL_TABLET | Freq: Every evening | ORAL | Status: DC | PRN

## 2014-02-24 MED ORDER — METHOCARBAMOL 500 MG PO TABS
500.0000 mg | ORAL_TABLET | Freq: Four times a day (QID) | ORAL | Status: DC | PRN
  Administered 2014-02-25: 500 mg via ORAL
  Filled 2014-02-24 (×2): qty 1

## 2014-02-24 MED ORDER — MENTHOL 3 MG MT LOZG: 1.0000 | LOZENGE | OROMUCOSAL | Status: DC | PRN

## 2014-02-24 MED ORDER — SODIUM CHLORIDE 0.9 % IV SOLN
INTRAVENOUS | Status: DC
  Administered 2014-02-24: 17:00:00 via INTRAVENOUS

## 2014-02-24 MED ORDER — PROPOFOL 10 MG/ML IV BOLUS
INTRAVENOUS | Status: DC | PRN
  Administered 2014-02-24: 20 mg via INTRAVENOUS
  Administered 2014-02-24: 160 mg via INTRAVENOUS

## 2014-02-24 MED ORDER — RIVAROXABAN 20 MG PO TABS
20.0000 mg | ORAL_TABLET | Freq: Every day | ORAL | Status: DC
  Administered 2014-02-24 – 2014-02-25 (×2): 20 mg via ORAL
  Filled 2014-02-24 (×2): qty 1

## 2014-02-24 MED ORDER — FENTANYL CITRATE 0.05 MG/ML IJ SOLN
INTRAMUSCULAR | Status: AC
  Filled 2014-02-24: qty 5

## 2014-02-24 MED ORDER — HYDROMORPHONE HCL PF 1 MG/ML IJ SOLN
1.0000 mg | INTRAMUSCULAR | Status: DC | PRN
  Administered 2014-02-24: 1 mg via INTRAVENOUS
  Filled 2014-02-24: qty 1

## 2014-02-24 MED ORDER — MIDAZOLAM HCL 2 MG/2ML IJ SOLN
1.0000 mg | INTRAMUSCULAR | Status: DC | PRN
  Administered 2014-02-24: 1.5 mg via INTRAVENOUS

## 2014-02-24 MED ORDER — METOCLOPRAMIDE HCL 5 MG/ML IJ SOLN: 5.0000 mg | Freq: Three times a day (TID) | INTRAMUSCULAR | Status: DC | PRN

## 2014-02-24 MED ORDER — HYDROMORPHONE HCL PF 1 MG/ML IJ SOLN
INTRAMUSCULAR | Status: AC
  Administered 2014-02-24: 0.5 mg via INTRAVENOUS
  Filled 2014-02-24: qty 1

## 2014-02-24 MED ORDER — FENTANYL CITRATE 0.05 MG/ML IJ SOLN
50.0000 ug | INTRAMUSCULAR | Status: DC | PRN
  Administered 2014-02-24: 75 ug via INTRAVENOUS

## 2014-02-24 MED ORDER — BUPIVACAINE-EPINEPHRINE (PF) 0.5% -1:200000 IJ SOLN
INTRAMUSCULAR | Status: AC
  Filled 2014-02-24: qty 30

## 2014-02-24 MED ORDER — OXYCODONE HCL 5 MG PO TABS
5.0000 mg | ORAL_TABLET | ORAL | Status: DC | PRN
  Administered 2014-02-24 (×2): 5 mg via ORAL
  Administered 2014-02-25 (×2): 10 mg via ORAL
  Filled 2014-02-24: qty 2
  Filled 2014-02-24: qty 1
  Filled 2014-02-24: qty 2
  Filled 2014-02-24: qty 1

## 2014-02-24 MED ORDER — EPHEDRINE SULFATE 50 MG/ML IJ SOLN
INTRAMUSCULAR | Status: DC | PRN
  Administered 2014-02-24: 10 mg via INTRAVENOUS

## 2014-02-24 MED ORDER — ONDANSETRON HCL 4 MG/2ML IJ SOLN
INTRAMUSCULAR | Status: DC | PRN
  Administered 2014-02-24: 4 mg via INTRAVENOUS

## 2014-02-24 MED ORDER — ONDANSETRON HCL 4 MG/2ML IJ SOLN: 4.0000 mg | Freq: Four times a day (QID) | INTRAMUSCULAR | Status: DC | PRN

## 2014-02-24 MED ORDER — ACETAMINOPHEN 650 MG RE SUPP: 650.0000 mg | Freq: Four times a day (QID) | RECTAL | Status: DC | PRN

## 2014-02-24 MED ORDER — BISACODYL 5 MG PO TBEC: 5.0000 mg | DELAYED_RELEASE_TABLET | Freq: Every day | ORAL | Status: DC | PRN

## 2014-02-24 MED ORDER — DIPHENHYDRAMINE HCL 12.5 MG/5ML PO ELIX: 12.5000 mg | ORAL_SOLUTION | ORAL | Status: DC | PRN

## 2014-02-24 MED ORDER — BUPIVACAINE-EPINEPHRINE 0.5% -1:200000 IJ SOLN
INTRAMUSCULAR | Status: DC | PRN
  Administered 2014-02-24: 30 mL

## 2014-02-24 MED ORDER — CEFAZOLIN SODIUM 1-5 GM-% IV SOLN
1.0000 g | Freq: Four times a day (QID) | INTRAVENOUS | Status: AC
  Administered 2014-02-24 (×2): 1 g via INTRAVENOUS
  Filled 2014-02-24 (×2): qty 50

## 2014-02-24 MED ORDER — ALBUTEROL SULFATE (2.5 MG/3ML) 0.083% IN NEBU: 2.0000 mL | INHALATION_SOLUTION | Freq: Two times a day (BID) | RESPIRATORY_TRACT | Status: DC

## 2014-02-24 MED ORDER — 0.9 % SODIUM CHLORIDE (POUR BTL) OPTIME
TOPICAL | Status: DC | PRN
  Administered 2014-02-24: 1000 mL

## 2014-02-24 MED ORDER — FLUTICASONE PROPIONATE HFA 110 MCG/ACT IN AERO
2.0000 | INHALATION_SPRAY | Freq: Every day | RESPIRATORY_TRACT | Status: DC
  Administered 2014-02-24 – 2014-02-25 (×2): 2 via RESPIRATORY_TRACT
  Filled 2014-02-24: qty 12

## 2014-02-24 MED ORDER — LIDOCAINE HCL (CARDIAC) 20 MG/ML IV SOLN
INTRAVENOUS | Status: DC | PRN
  Administered 2014-02-24: 80 mg via INTRAVENOUS

## 2014-02-24 MED ORDER — LACTATED RINGERS IV SOLN
INTRAVENOUS | Status: DC
  Administered 2014-02-24 (×3): via INTRAVENOUS

## 2014-02-24 MED ORDER — LORATADINE 10 MG PO TABS
10.0000 mg | ORAL_TABLET | Freq: Every day | ORAL | Status: DC
  Administered 2014-02-24 – 2014-02-25 (×2): 10 mg via ORAL
  Filled 2014-02-24 (×2): qty 1

## 2014-02-24 MED ORDER — FENTANYL CITRATE 0.05 MG/ML IJ SOLN
INTRAMUSCULAR | Status: AC
  Administered 2014-02-24: 75 ug via INTRAVENOUS
  Filled 2014-02-24: qty 2

## 2014-02-24 MED ORDER — DEXAMETHASONE SODIUM PHOSPHATE 10 MG/ML IJ SOLN
INTRAMUSCULAR | Status: DC | PRN
  Administered 2014-02-24: 10 mg via INTRAVENOUS

## 2014-02-24 MED ORDER — MIDAZOLAM HCL 2 MG/2ML IJ SOLN
INTRAMUSCULAR | Status: AC
  Administered 2014-02-24: 1.5 mg via INTRAVENOUS
  Filled 2014-02-24: qty 2

## 2014-02-24 MED ORDER — BUPIVACAINE LIPOSOME 1.3 % IJ SUSP
INTRAMUSCULAR | Status: DC | PRN
  Administered 2014-02-24: 20 mL

## 2014-02-24 MED ORDER — DILTIAZEM HCL ER 180 MG PO CP24
180.0000 mg | ORAL_CAPSULE | Freq: Every day | ORAL | Status: DC
  Administered 2014-02-25: 180 mg via ORAL
  Filled 2014-02-24 (×2): qty 1

## 2014-02-24 MED ORDER — METOCLOPRAMIDE HCL 5 MG PO TABS
5.0000 mg | ORAL_TABLET | Freq: Three times a day (TID) | ORAL | Status: DC | PRN
  Filled 2014-02-24: qty 2

## 2014-02-24 MED ORDER — NEOSTIGMINE METHYLSULFATE 10 MG/10ML IV SOLN
INTRAVENOUS | Status: DC | PRN
  Administered 2014-02-24: 4 mg via INTRAVENOUS

## 2014-02-24 MED ORDER — ROCURONIUM BROMIDE 100 MG/10ML IV SOLN
INTRAVENOUS | Status: DC | PRN
  Administered 2014-02-24: 35 mg via INTRAVENOUS

## 2014-02-24 MED ORDER — FENTANYL CITRATE 0.05 MG/ML IJ SOLN
INTRAMUSCULAR | Status: DC | PRN
  Administered 2014-02-24 (×2): 50 ug via INTRAVENOUS
  Administered 2014-02-24: 100 ug via INTRAVENOUS
  Administered 2014-02-24 (×2): 50 ug via INTRAVENOUS

## 2014-02-24 MED ORDER — ACETAMINOPHEN 325 MG PO TABS: 650.0000 mg | ORAL_TABLET | Freq: Four times a day (QID) | ORAL | Status: DC | PRN

## 2014-02-24 MED ORDER — PROPRANOLOL HCL 10 MG PO TABS
10.0000 mg | ORAL_TABLET | Freq: Four times a day (QID) | ORAL | Status: DC | PRN
  Filled 2014-02-24: qty 1

## 2014-02-24 MED ORDER — ALPRAZOLAM 0.25 MG PO TABS
0.2500 mg | ORAL_TABLET | Freq: Every day | ORAL | Status: DC
  Administered 2014-02-24: 0.25 mg via ORAL
  Filled 2014-02-24: qty 1

## 2014-02-24 MED ORDER — CELECOXIB 200 MG PO CAPS
200.0000 mg | ORAL_CAPSULE | Freq: Two times a day (BID) | ORAL | Status: DC
  Administered 2014-02-24: 200 mg via ORAL
  Filled 2014-02-24 (×4): qty 1

## 2014-02-24 MED ORDER — LOSARTAN POTASSIUM 50 MG PO TABS
50.0000 mg | ORAL_TABLET | Freq: Every day | ORAL | Status: DC
  Administered 2014-02-24: 50 mg via ORAL
  Filled 2014-02-24 (×2): qty 1

## 2014-02-24 MED ORDER — GLYCOPYRROLATE 0.2 MG/ML IJ SOLN
INTRAMUSCULAR | Status: DC | PRN
  Administered 2014-02-24: 0.6 mg via INTRAVENOUS

## 2014-02-24 MED ORDER — ALBUTEROL SULFATE HFA 108 (90 BASE) MCG/ACT IN AERS
INHALATION_SPRAY | RESPIRATORY_TRACT | Status: DC | PRN
  Administered 2014-02-24 (×2): 2 via RESPIRATORY_TRACT

## 2014-02-24 MED ORDER — FLEET ENEMA 7-19 GM/118ML RE ENEM: 1.0000 | ENEMA | Freq: Once | RECTAL | Status: AC | PRN

## 2014-02-24 MED ORDER — SODIUM CHLORIDE 0.9 % IR SOLN
Status: DC | PRN
  Administered 2014-02-24: 1000 mL

## 2014-02-24 MED ORDER — PROPOFOL 10 MG/ML IV BOLUS
INTRAVENOUS | Status: AC
  Filled 2014-02-24: qty 20

## 2014-02-24 MED ORDER — ZOLPIDEM TARTRATE 5 MG PO TABS: 5.0000 mg | ORAL_TABLET | Freq: Every evening | ORAL | Status: DC | PRN

## 2014-02-24 MED ORDER — PHENOL 1.4 % MT LIQD: 1.0000 | OROMUCOSAL | Status: DC | PRN

## 2014-02-24 MED ORDER — METHOCARBAMOL 1000 MG/10ML IJ SOLN
500.0000 mg | Freq: Four times a day (QID) | INTRAVENOUS | Status: DC | PRN
  Filled 2014-02-24: qty 5

## 2014-02-24 MED ORDER — DOCUSATE SODIUM 100 MG PO CAPS
100.0000 mg | ORAL_CAPSULE | Freq: Two times a day (BID) | ORAL | Status: DC
  Administered 2014-02-24 – 2014-02-25 (×2): 100 mg via ORAL
  Filled 2014-02-24 (×4): qty 1

## 2014-02-24 MED ORDER — ALBUTEROL SULFATE (2.5 MG/3ML) 0.083% IN NEBU: 2.0000 mL | INHALATION_SOLUTION | Freq: Four times a day (QID) | RESPIRATORY_TRACT | Status: DC | PRN

## 2014-02-24 MED ORDER — OXYCODONE HCL ER 10 MG PO T12A
10.0000 mg | EXTENDED_RELEASE_TABLET | Freq: Two times a day (BID) | ORAL | Status: DC
  Administered 2014-02-24 – 2014-02-25 (×2): 10 mg via ORAL
  Filled 2014-02-24 (×2): qty 1

## 2014-02-24 MED ORDER — HYDROMORPHONE HCL PF 1 MG/ML IJ SOLN
0.2500 mg | INTRAMUSCULAR | Status: DC | PRN
  Administered 2014-02-24 (×4): 0.5 mg via INTRAVENOUS

## 2014-02-24 MED ORDER — BUPROPION HCL ER (XL) 150 MG PO TB24
150.0000 mg | ORAL_TABLET | Freq: Every day | ORAL | Status: DC
  Administered 2014-02-25: 150 mg via ORAL
  Filled 2014-02-24 (×2): qty 1

## 2014-02-24 MED ORDER — PHENYLEPHRINE HCL 10 MG/ML IJ SOLN
INTRAMUSCULAR | Status: DC | PRN
  Administered 2014-02-24: 80 ug via INTRAVENOUS

## 2014-02-24 MED ORDER — MIDAZOLAM HCL 2 MG/2ML IJ SOLN
INTRAMUSCULAR | Status: AC
  Filled 2014-02-24: qty 2

## 2014-02-24 MED ORDER — ONDANSETRON HCL 4 MG PO TABS: 4.0000 mg | ORAL_TABLET | Freq: Four times a day (QID) | ORAL | Status: DC | PRN

## 2014-02-24 SURGICAL SUPPLY — 55 items
BANDAGE ESMARK 6X9 LF (GAUZE/BANDAGES/DRESSINGS) ×1 IMPLANT
BLADE SAGITTAL 13X1.27X60 (BLADE) ×2 IMPLANT
BLADE SAW SGTL 83.5X18.5 (BLADE) ×2 IMPLANT
BNDG ESMARK 6X9 LF (GAUZE/BANDAGES/DRESSINGS) ×2
BOWL SMART MIX CTS (DISPOSABLE) ×2 IMPLANT
CAP POR TM CP VIT E LN CER HD ×2 IMPLANT
CEMENT BONE SIMPLEX SPEEDSET (Cement) ×4 IMPLANT
COVER SURGICAL LIGHT HANDLE (MISCELLANEOUS) ×2 IMPLANT
CUFF TOURNIQUET SINGLE 34IN LL (TOURNIQUET CUFF) ×2 IMPLANT
DRAPE EXTREMITY T 121X128X90 (DRAPE) ×2 IMPLANT
DRAPE INCISE IOBAN 66X45 STRL (DRAPES) ×4 IMPLANT
DRAPE PROXIMA HALF (DRAPES) ×2 IMPLANT
DRAPE U-SHAPE 47X51 STRL (DRAPES) ×2 IMPLANT
DRSG ADAPTIC 3X8 NADH LF (GAUZE/BANDAGES/DRESSINGS) ×2 IMPLANT
DRSG PAD ABDOMINAL 8X10 ST (GAUZE/BANDAGES/DRESSINGS) ×2 IMPLANT
DURAPREP 26ML APPLICATOR (WOUND CARE) ×4 IMPLANT
ELECT REM PT RETURN 9FT ADLT (ELECTROSURGICAL) ×2
ELECTRODE REM PT RTRN 9FT ADLT (ELECTROSURGICAL) ×1 IMPLANT
EVACUATOR 1/8 PVC DRAIN (DRAIN) ×2 IMPLANT
GAUZE SPONGE 4X4 12PLY STRL (GAUZE/BANDAGES/DRESSINGS) ×2 IMPLANT
GLOVE BIOGEL M 7.0 STRL (GLOVE) ×2 IMPLANT
GLOVE BIOGEL PI IND STRL 7.5 (GLOVE) ×1 IMPLANT
GLOVE BIOGEL PI IND STRL 8.5 (GLOVE) ×2 IMPLANT
GLOVE BIOGEL PI INDICATOR 7.5 (GLOVE) ×1
GLOVE BIOGEL PI INDICATOR 8.5 (GLOVE) ×2
GLOVE SURG ORTHO 8.0 STRL STRW (GLOVE) ×4 IMPLANT
GOWN STRL REUS W/ TWL LRG LVL3 (GOWN DISPOSABLE) IMPLANT
GOWN STRL REUS W/ TWL XL LVL3 (GOWN DISPOSABLE) ×4 IMPLANT
GOWN STRL REUS W/TWL LRG LVL3 (GOWN DISPOSABLE)
GOWN STRL REUS W/TWL XL LVL3 (GOWN DISPOSABLE) ×4
HANDPIECE INTERPULSE COAX TIP (DISPOSABLE) ×1
HOOD PEEL AWAY FACE SHEILD DIS (HOOD) ×8 IMPLANT
KIT BASIN OR (CUSTOM PROCEDURE TRAY) ×2 IMPLANT
KIT ROOM TURNOVER OR (KITS) ×2 IMPLANT
MANIFOLD NEPTUNE II (INSTRUMENTS) ×2 IMPLANT
NEEDLE 22X1 1/2 (OR ONLY) (NEEDLE) ×4 IMPLANT
NS IRRIG 1000ML POUR BTL (IV SOLUTION) ×2 IMPLANT
PACK TOTAL JOINT (CUSTOM PROCEDURE TRAY) ×2 IMPLANT
PAD ARMBOARD 7.5X6 YLW CONV (MISCELLANEOUS) ×4 IMPLANT
PADDING CAST COTTON 6X4 STRL (CAST SUPPLIES) ×2 IMPLANT
SET HNDPC FAN SPRY TIP SCT (DISPOSABLE) ×1 IMPLANT
SPONGE GAUZE 4X4 12PLY STER LF (GAUZE/BANDAGES/DRESSINGS) ×2 IMPLANT
STAPLER VISISTAT 35W (STAPLE) ×2 IMPLANT
SUCTION FRAZIER TIP 10 FR DISP (SUCTIONS) ×2 IMPLANT
SUT BONE WAX W31G (SUTURE) ×2 IMPLANT
SUT VIC AB 0 CTB1 27 (SUTURE) ×2 IMPLANT
SUT VIC AB 1 CT1 27 (SUTURE) ×1
SUT VIC AB 1 CT1 27XBRD ANBCTR (SUTURE) ×1 IMPLANT
SUT VIC AB 2-0 CT1 27 (SUTURE) ×1
SUT VIC AB 2-0 CT1 TAPERPNT 27 (SUTURE) ×1 IMPLANT
SYR 20CC LL (SYRINGE) ×4 IMPLANT
TOWEL OR 17X24 6PK STRL BLUE (TOWEL DISPOSABLE) ×2 IMPLANT
TOWEL OR 17X26 10 PK STRL BLUE (TOWEL DISPOSABLE) ×2 IMPLANT
TRAY FOLEY CATH 14FR (SET/KITS/TRAYS/PACK) IMPLANT
WATER STERILE IRR 1000ML POUR (IV SOLUTION) IMPLANT

## 2014-02-24 NOTE — Progress Notes (Signed)
Orthopedic Tech Progress Note Patient Details:  Monica Williamson 11/10/1940 [id number]  Patient ID: Monica Williamson, female   DOB: [birth year], 73 y.o.   MRN: [id number] Placed pt's lle in cpm @ 0=90 degrees @ 2015  Monica Williamson, Monica Williamson 02/24/2014, 8:22 PM

## 2014-02-24 NOTE — Evaluation (Signed)
Physical Therapy Evaluation Patient Details Name: Monica Williamson MRN: [id number] DOB: 18-Sep-1940 Today's Date: 02/24/2014   History of Present Illness  Patient is a 73 yo female admitted 02/24/14 now s/p Lt TKA.  PMH:  Rt TKA, Afib, anxiety  Clinical Impression  Patient presents with problems listed below.  Will benefit from acute PT to maximize independence prior to discharge home with husband.    Follow Up Recommendations Home health PT;Supervision/Assistance - 24 hour    Equipment Recommendations  None recommended by PT    Recommendations for Other Services       Precautions / Restrictions Precautions Precautions: Knee Precaution Booklet Issued: Yes (comment) Precaution Comments: Reviewed precautions with patient. Restrictions Weight Bearing Restrictions: Yes LLE Weight Bearing: Weight bearing as tolerated      Mobility  Bed Mobility               General bed mobility comments: Patient on BSC as PT entered room.  Transfers Overall transfer level: Needs assistance Equipment used: Rolling walker (2 wheeled) Transfers: Sit to/from Stand Sit to Stand: Min assist         General transfer comment: Verbal cues for hand placement and technique.  Assist to rise to standing and for balance initially.  Instructed patient to stand for several seconds before ambulation due to dizziness.  Ambulation/Gait Ambulation/Gait assistance: Min assist Ambulation Distance (Feet): 20 Feet Assistive device: Rolling walker (2 wheeled) Gait Pattern/deviations: Step-to pattern;Decreased stance time - left;Decreased step length - right;Decreased weight shift to left;Antalgic Gait velocity: Decreased Gait velocity interpretation: Below normal speed for age/gender General Gait Details: Verbal cues for safe use of RW and gait sequence.  Patient taking very short steps during gait.  Stairs            Wheelchair Mobility    Modified Rankin (Stroke Patients Only)        Balance Overall balance assessment: Needs assistance         Standing balance support: Bilateral upper extremity supported Standing balance-Leahy Scale: Poor                               Pertinent Vitals/Pain Pain Assessment: 0-10 Pain Score: 5  Pain Location: Lt knee Pain Descriptors / Indicators: Aching Pain Intervention(s): Limited activity within patient's tolerance;Repositioned;Patient requesting pain meds-RN notified    Home Living Family/patient expects to be discharged to:: Private residence Living Arrangements: Spouse/significant other Available Help at Discharge: Family;Available 24 hours/day Type of Home: House Home Access: Stairs to enter Entrance Stairs-Rails: Right Entrance Stairs-Number of Steps: 5 Home Layout: Two level;Able to live on main level with bedroom/bathroom Home Equipment: Gilford Rile - 2 wheels;Bedside commode      Prior Function Level of Independence: Independent               Hand Dominance        Extremity/Trunk Assessment   Upper Extremity Assessment: Overall WFL for tasks assessed           Lower Extremity Assessment: LLE deficits/detail   LLE Deficits / Details: Decreased strength and ROM post-op  Cervical / Trunk Assessment: Normal  Communication   Communication: No difficulties  Cognition Arousal/Alertness: Awake/alert;Lethargic;Suspect due to medications (Became lethargic/sleepy at end of session) Behavior During Therapy: [facility] for tasks assessed/performed Overall Cognitive Status: Within Functional Limits for tasks assessed  General Comments      Exercises Total Joint Exercises Ankle Circles/Pumps: AROM;Both;10 reps;Seated Quad Sets: AROM;Left;10 reps;Seated      Assessment/Plan    PT Assessment Patient needs continued PT services  PT Diagnosis Difficulty walking;Acute pain   PT Problem List Decreased strength;Decreased range of motion;Decreased activity  tolerance;Decreased balance;Decreased mobility;Decreased knowledge of use of DME;Decreased knowledge of precautions;Pain  PT Treatment Interventions DME instruction;Gait training;Stair training;Functional mobility training;Therapeutic activities;Therapeutic exercise;Patient/family education   PT Goals (Current goals can be found in the Care Plan section) Acute Rehab PT Goals Patient Stated Goal: To continue to improve with mobility PT Goal Formulation: With patient Time For Goal Achievement: 03/03/14 Potential to Achieve Goals: Good    Frequency 7X/week   Barriers to discharge        Co-evaluation               End of Session Equipment Utilized During Treatment: Gait belt;Oxygen Activity Tolerance: Patient limited by pain;Patient limited by lethargy Patient left: in chair;with call bell/phone within reach Nurse Communication: Mobility status;Patient requests pain meds         Time: 3825-0539 PT Time Calculation (min): 26 min   Charges:   PT Evaluation $Initial PT Evaluation Tier I: 1 Procedure PT Treatments $Gait Training: 8-22 mins   PT G Codes:          Despina Pole 02/24/2014, 5:44 PM Carita Pian. Sanjuana Kava, [place] Pager [phone]

## 2014-02-24 NOTE — Transfer of Care (Signed)
Immediate Anesthesia Transfer of Care Note  Patient: Monica Williamson  Procedure(s) Performed: Procedure(s): TOTAL KNEE ARTHROPLASTY (Left)  Patient Location: PACU  Anesthesia Type:General  Level of Consciousness: awake, alert , patient cooperative and responds to stimulation  Airway & Oxygen Therapy: Patient Spontanous Breathing and Patient connected to nasal cannula oxygen  Post-op Assessment: Report given to PACU RN, Post -op Vital signs reviewed and stable and Patient moving all extremities X 4  Post vital signs: Reviewed and stable  Complications: No apparent anesthesia complications

## 2014-02-24 NOTE — Anesthesia Procedure Notes (Signed)
Anesthesia Regional Block:  Adductor canal block  Pre-Anesthetic Checklist: ,, timeout performed, Correct Patient, Correct Site, Correct Laterality, Correct Procedure, Correct Position, site marked, Risks and benefits discussed,  Surgical consent,  Pre-op evaluation,  At surgeon's request and post-op pain management  Laterality: Left  Prep: chloraprep       Needles:   Needle Type: Stimulator Needle - 80          Additional Needles:  Procedures: Doppler guided and nerve stimulator Adductor canal block Narrative:  Start time: 02/24/2014 8:25 AM End time: 02/24/2014 8:40 AM Injection made incrementally with aspirations every 5 mL.  Performed by: Personally  Anesthesiologist: Dr. Oletta Lamas

## 2014-02-24 NOTE — Plan of Care (Signed)
Problem: Consults Goal: Diagnosis- Total Joint Replacement Primary Total Knee Left     

## 2014-02-24 NOTE — H&P (Signed)
Monica Williamson MRN:  [id number] DOB/SEX:  1940-11-05/female  CHIEF COMPLAINT:  Painful left Knee  HISTORY: Patient is a 73 y.o. female presented with a history of pain in the left knee. Onset of symptoms was gradual starting several years ago with gradually worsening course since that time. Prior procedures on the knee include none. Patient has been treated conservatively with over-the-counter NSAIDs and activity modification. Patient currently rates pain in the knee at 10 out of 10 with activity. There is pain at night.  PAST MEDICAL HISTORY: Patient Active Problem List   Diagnosis Date Noted  . 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 Long term (current) use of anticoagulants 10/01/2012  . Atrial fibrillation 09/25/2012  . Chest pain 10/26/2010  . 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  . BRONCHIECTASIS 08/13/2007  . ALLERGIC RHINITIS 05/12/2007  . ASTHMA, CHILDHOOD 05/12/2007  . ABSCESS, BREAST 05/12/2007  . COUGH 05/12/2007  . HYSTERECTOMY, VAGINAL, HX OF 05/12/2007   Past Medical History  Diagnosis Date  . Hypertension   . Childhood asthma   . Diverticulosis   . Vitamin D deficiency   . Depression   . Anxiety   . Pulmonary nodule   . Nocturia   . Colon polyps   . Cholelithiasis   . Atrial fibrillation     when takes Warfarin  . Atrial fibrillation, currently in sinus rhythm   . Bronchiectasis     contolled with lovent and allergy med  . Sleep related teeth grinding     wears a mouth guard at night  . Stress fracture of tibia 2014  . Complication of anesthesia   . PONV (postoperative nausea and vomiting)     severe n/v post anesthesia   . Osteopenia   . Arthritis   . History of airborne allergies     uses inhalers  . H/O bronchiectasis   . Dysrhythmia     sees Dr Acie Fredrickson annually for h/o Afib   Past Surgical History  Procedure Laterality Date  . Vesicovaginal fistula closure w/ tah    . Incision and drainage breast abscess    . Vaginal hysterectomy    .  Cholecystectomy      11/2010  . Breast surgery    . Meniscus repair Right 2014  . Total knee arthroplasty Right 09/30/2013    Procedure: RIGHT TOTAL KNEE ARTHROPLASTY;  Surgeon: Vickey Huger, MD;  Location: [facility];  Service: Orthopedics;  Laterality: Right;  . Joint replacement Right 09/2013    knee     MEDICATIONS:   No prescriptions prior to admission    ALLERGIES:   Allergies  Allergen Reactions  . Warfarin And Related Hives and Itching  . Avelox [Moxifloxacin] Diarrhea    REVIEW OF SYSTEMS:  A comprehensive review of systems was negative.   FAMILY HISTORY:   Family History  Problem Relation Age of Onset  . Heart disease Sister   . Colon cancer Neg Hx     SOCIAL HISTORY:   History  Substance Use Topics  . Smoking status: Former Smoker -- 1.50 packs/day for 20 years    Quit date: 07/18/1978  . Smokeless tobacco: Not on file  . Alcohol Use: Not on file     Comment: daily cocktail     EXAMINATION:  Vital signs in last 24 hours:    General appearance: alert, cooperative and no distress Lungs: clear to auscultation bilaterally Heart: regular rate and rhythm, S1, S2 normal, no murmur, click, rub or gallop Abdomen: soft, non-tender; bowel sounds normal;  no masses,  no organomegaly Extremities: extremities normal, atraumatic, no cyanosis or edema and Homans sign is negative, no sign of DVT Pulses: 2+ and symmetric Skin: Skin color, texture, turgor normal. No rashes or lesions Neurologic: Alert and oriented X 3, normal strength and tone. Normal symmetric reflexes. Normal coordination and gait  Musculoskeletal:  ROM 0-110, Ligaments intact,  Imaging Review Plain radiographs demonstrate severe degenerative joint disease of the left knee. The overall alignment is significant valgus. The bone quality appears to be good for age and reported activity level.  Assessment/Plan: End stage arthritis, left knee   The patient history, physical examination and imaging studies  are consistent with advanced degenerative joint disease of the left knee. The patient has failed conservative treatment.  The clearance notes were reviewed.  After discussion with the patient it was felt that Total Knee Replacement was indicated. The procedure,  risks, and benefits of total knee arthroplasty were presented and reviewed. The risks including but not limited to aseptic loosening, infection, blood clots, vascular injury, stiffness, patella tracking problems complications among others were discussed. The patient acknowledged the explanation, agreed to proceed with the plan.  Monica Williamson 02/24/2014, 6:44 AM

## 2014-02-24 NOTE — Progress Notes (Signed)
Orthopedic Tech Progress Note Patient Details:  Monica Williamson 1941-06-07 [id number] CPM applied to LLE with appropriate settings. OHF applied to bed. Footsie roll provided. CPM Left Knee CPM Left Knee: On Left Knee Flexion (Degrees): 90 Left Knee Extension (Degrees): 0   Asia R Thompson 02/24/2014, 1:11 PM

## 2014-02-24 NOTE — Progress Notes (Signed)
OT Cancellation Note  Patient Details Name: Monica Williamson MRN: [id number] DOB: Mar 02, 1941   Cancelled Treatment:    Reason Eval/Treat Not Completed: OT screened, no needs identified, will sign off. Spoke with pt and she recently had knee surgery in March and has no OT concerns. Will sign off.  Benito Mccreedy OTR/L [phone] 02/24/2014, 5:41 PM

## 2014-02-24 NOTE — Op Note (Signed)
TOTAL KNEE REPLACEMENT OPERATIVE NOTE:  02/24/2014  1:37 PM  PATIENT:  Monica Williamson  73 y.o. female  PRE-OPERATIVE DIAGNOSIS:  osteoarthritis left knee  POST-OPERATIVE DIAGNOSIS:  OA left knee  PROCEDURE:  Procedure(s): TOTAL KNEE ARTHROPLASTY  SURGEON:  Surgeon(s): Vickey Huger, MD  PHYSICIAN ASSISTANT: Carlynn Spry, [facility]  ANESTHESIA:   general  DRAINS: Hemovac  SPECIMEN: None  COUNTS:  Correct  TOURNIQUET:   Total Tourniquet Time Documented: Thigh (Left) - 56 minutes Total: Thigh (Left) - 56 minutes   DICTATION:  Indication for procedure:    The patient is a 73 y.o. female who has failed conservative treatment for osteoarthritis left knee.  Informed consent was obtained prior to anesthesia. The risks versus benefits of the operation were explain and in a way the patient can, and did, understand.   On the implant demand matching protocol, this patient scored 10.  Therefore, this patient was not receive a polyethylene insert with vitamin E which is a high demand implant.  Description of procedure:     The patient was taken to the operating room and placed under anesthesia.  The patient was positioned in the usual fashion taking care that all body parts were adequately padded and/or protected.  I foley catheter was not placed.  A tourniquet was applied and the leg prepped and draped in the usual sterile fashion.  The extremity was exsanguinated with the esmarch and tourniquet inflated to 350 mmHg.  Pre-operative range of motion was normal.  The knee was in 5 degree of mild varus.  A midline incision approximately 6-7 inches long was made with a #10 blade.  A new blade was used to make a parapatellar arthrotomy going 2-3 cm into the quadriceps tendon, over the patella, and alongside the medial aspect of the patellar tendon.  A synovectomy was then performed with the #10 blade and forceps. I then elevated the deep MCL off the medial tibial metaphysis subperiosteally  around to the semimembranosus attachment.    I everted the patella and used calipers to measure patellar thickness.  I used the reamer to ream down to appropriate thickness to recreate the native thickness.  I then removed excess bone with the rongeur and sagittal saw.  I used the appropriately sized template and drilled the three lug holes.  I then put the trial in place and measured the thickness with the calipers to ensure recreation of the native thickness.  The trial was then removed and the patella subluxed and the knee brought into flexion.  A homan retractor was place to retract and protect the patella and lateral structures.  A Z-retractor was place medially to protect the medial structures.  The extra-medullary alignment system was used to make cut the tibial articular surface perpendicular to the anamotic axis of the tibia and in 3 degrees of posterior slope.  The cut surface and alignment jig was removed.  I then used the intramedullary alignment guide to make a 7 valgus cut on the distal femur.  I then marked out the epicondylar axis on the distal femur.  The posterior condylar axis measured 3 degrees.  I then used the anterior referencing sizer and measured the femur to be a size 7.  The 4-In-1 cutting block was screwed into place in external rotation matching the posterior condylar angle, making our cuts perpendicular to the epicondylar axis.  Anterior, posterior and chamfer cuts were made with the sagittal saw.  The cutting block and cut pieces were removed.  A  lamina spreader was placed in 90 degrees of flexion.  The ACL, PCL, menisci, and posterior condylar osteophytes were removed.  A 10 mm spacer blocked was found to offer good flexion and extension gap balance after minimal in degree releasing.   The scoop retractor was then placed and the femoral finishing block was pinned in place.  The small sagittal saw was used as well as the lug drill to finish the femur.  The block and cut  surfaces were removed and the medullary canal hole filled with autograft bone from the cut pieces.  The tibia was delivered forward in deep flexion and external rotation.  A size E tray was selected and pinned into place centered on the medial 1/3 of the tibial tubercle.  The reamer and keel was used to prepare the tibia through the tray.    I then trialed with the size 7 femur, size F tibia, a 10 mm insert and the 32 patella.  I had excellent flexion/extension gap balance, excellent patella tracking.  Flexion was full and beyond 120 degrees; extension was zero.  These components were chosen and the staff opened them to me on the back table while the knee was lavaged copiously and the cement mixed.  The soft tissue was infiltrated with 60cc of exparel 1.3% through a 21 gauge needle.  I cemented in the components and removed all excess cement.  The polyethylene tibial component was snapped into place and the knee placed in extension while cement was hardening.  The capsule was infilltrated with 30cc of .25% Marcaine with epinephrine.  A hemovac was place in the joint exiting superolaterally.  A pain pump was place superomedially superficial to the arthrotomy.  Once the cement was hard, the tourniquet was let down.  Hemostasis was obtained.  The arthrotomy was closed with figure-8 #1 vicryl sutures.  The deep soft tissues were closed with #0 vicryls and the subcuticular layer closed with a running #2-0 vicryl.  The skin was reapproximated and closed with skin staples.  The wound was dressed with xeroform, 4 x4's, 2 ABD sponges, a single layer of webril and a TED stocking.   The patient was then awakened, extubated, and taken to the recovery room in stable condition.  BLOOD LOSS:  300cc DRAINS: 1 hemovac, 1 pain catheter COMPLICATIONS:  None.  PLAN OF CARE: Admit to inpatient   PATIENT DISPOSITION:  PACU - hemodynamically stable.   Delay start of Pharmacological VTE agent (>24hrs) due to surgical  blood loss or risk of bleeding:  not applicable  Please fax a copy of this op note to my office at [phone] (please only include page 1 and 2 of the Case Information op note)

## 2014-02-24 NOTE — Progress Notes (Signed)
Utilization review completed.  

## 2014-02-24 NOTE — Anesthesia Postprocedure Evaluation (Signed)
  Anesthesia Post-op Note  Patient: Monica Williamson  Procedure(s) Performed: Procedure(s): TOTAL KNEE ARTHROPLASTY (Left)  Patient Location: PACU  Anesthesia Type:General  Level of Consciousness: awake  Airway and Oxygen Therapy: Patient Spontanous Breathing  Post-op Pain: mild  Post-op Assessment: Post-op Vital signs reviewed  Post-op Vital Signs: Reviewed  Last Vitals:  Filed Vitals:   02/24/14 1111  BP: 159/66  Pulse:   Temp: 36.3 C  Resp:     Complications: No apparent anesthesia complications

## 2014-02-24 NOTE — Anesthesia Preprocedure Evaluation (Signed)
Anesthesia Evaluation  Patient identified by MRN, date of birth, ID band Patient awake    Reviewed: Allergy & Precautions, H&P , NPO status   Airway Mallampati: II      Dental   Pulmonary asthma , former smoker,  breath sounds clear to auscultation        Cardiovascular hypertension, Rhythm:Regular Rate:Normal     Neuro/Psych    GI/Hepatic negative GI ROS, Neg liver ROS,   Endo/Other    Renal/GU negative Renal ROS     Musculoskeletal   Abdominal   Peds  Hematology   Anesthesia Other Findings   Reproductive/Obstetrics                           Anesthesia Physical Anesthesia Plan  ASA: III  Anesthesia Plan: General   Post-op Pain Management:    Induction: Intravenous  Airway Management Planned: LMA  Additional Equipment:   Intra-op Plan:   Post-operative Plan: Extubation in OR  Informed Consent: I have reviewed the patients History and Physical, chart, labs and discussed the procedure including the risks, benefits and alternatives for the proposed anesthesia with the patient or authorized representative who has indicated his/her understanding and acceptance.   Dental advisory given  Plan Discussed with: CRNA and Anesthesiologist  Anesthesia Plan Comments:         Anesthesia Quick Evaluation

## 2014-02-25 LAB — BASIC METABOLIC PANEL
Anion gap: 13 (ref 5–15)
BUN: 14 mg/dL (ref 6–23)
CO2: 24 mEq/L (ref 19–32)
Calcium: 8.5 mg/dL (ref 8.4–10.5)
Chloride: 102 mEq/L (ref 96–112)
Creatinine, Ser: 0.63 mg/dL (ref 0.50–1.10)
GFR calc non Af Amer: 86 mL/min — ABNORMAL LOW (ref >90)
GLUCOSE: 157 mg/dL — AB (ref 70–99)
Potassium: 5.6 mEq/L — ABNORMAL HIGH (ref 3.7–5.3)
Sodium: 139 mEq/L (ref 137–147)

## 2014-02-25 LAB — CBC
HCT: 33.5 % — ABNORMAL LOW (ref 36.0–46.0)
HEMOGLOBIN: 10.6 g/dL — AB (ref 12.0–15.0)
MCH: 28.7 pg (ref 26.0–34.0)
MCHC: 31.6 g/dL (ref 30.0–36.0)
MCV: 90.8 fL (ref 78.0–100.0)
Platelets: 355 10*3/uL (ref 150–400)
RBC: 3.69 MIL/uL — ABNORMAL LOW (ref 3.87–5.11)
RDW: 18 % — ABNORMAL HIGH (ref 11.5–15.5)
WBC: 18.6 10*3/uL — ABNORMAL HIGH (ref 4.0–10.5)

## 2014-02-25 MED ORDER — TIZANIDINE HCL 4 MG PO TABS: 4.0000 mg | ORAL_TABLET | Freq: Three times a day (TID) | ORAL | Status: DC | PRN

## 2014-02-25 MED ORDER — OXYCODONE HCL 5 MG PO TABS: 5.0000 mg | ORAL_TABLET | ORAL | Status: DC | PRN

## 2014-02-25 MED ORDER — RIVAROXABAN 20 MG PO TABS: 20.0000 mg | ORAL_TABLET | Freq: Every day | ORAL | Status: DC

## 2014-02-25 NOTE — Discharge Instructions (Signed)
Diet: As you were doing prior to hospitalization   Activity:  Increase activity slowly as tolerated                  No lifting or driving for 6 weeks  Shower:  May shower without a dressing once there is no drainage from your wound.                 Do NOT wash over the wound.                 Dressing:  You may change your dressing on Wednesday                    Then change the dressing daily with sterile 4"x4"s gauze dressing                     And TED hose for knees.  Weight Bearing:  Weight bearing as tolerated as taught in physical therapy.  Use a                                walker or Crutches as instructed.  To prevent constipation: you may use a stool softener such as -               Colace ( over the counter) 100 mg by mouth twice a day                Drink plenty of fluids ( prune juice may be helpful) and high fiber foods                Miralax ( over the counter) for constipation as needed.    Precautions:  If you experience chest pain or shortness of breath - call 911 immediately               For transfer to the hospital emergency department!!               If you develop a fever greater that 101 F, purulent drainage from wound,                             increased redness or drainage from wound, or calf pain -- Call the office.  Follow- Up Appointment:  Please call for an appointment to be seen on 03/11/14                                              [facility] office:  [phone]            [address] [place], [place] [address]

## 2014-02-25 NOTE — Progress Notes (Signed)
Physical Therapy Treatment Patient Details Name: Monica Williamson MRN: [id number] DOB: [birth year] Today's Date: 02/25/2014    History of Present Illness Patient is a 73 yo female admitted 02/24/14 now s/p Lt TKA.  PMH:  Rt TKA, Afib, anxiety    PT Comments    Patient progressing well. Able to complete stair training with husband present. Patient planning to DC later this afternoon. Will continue with current POC  Follow Up Recommendations  Home health PT;Supervision/Assistance - 24 hour     Equipment Recommendations  None recommended by PT    Recommendations for Other Services       Precautions / Restrictions      Mobility  Bed Mobility Overal bed mobility: Modified Independent                Transfers Overall transfer level: Needs assistance Equipment used: Rolling walker (2 wheeled)   Sit to Stand: Min guard         General transfer comment: Cues for hand placement  Ambulation/Gait Ambulation/Gait assistance: Min guard Ambulation Distance (Feet): 200 Feet Assistive device: Rolling walker (2 wheeled) Gait Pattern/deviations: Decreased stride length;Step-through pattern     General Gait Details: Verbal cues for safe use of RW and gait sequence.  Patient taking very short steps during gait.   Stairs Stairs: Yes Stairs assistance: Min assist Stair Management: Backwards;With walker;Step to pattern Number of Stairs: 4 General stair comments: Min A for use of RW. Cues for sequency and technique  Wheelchair Mobility    Modified Rankin (Stroke Patients Only)       Balance                                    Cognition Arousal/Alertness: Awake/alert Behavior During Therapy: WFL for tasks assessed/performed Overall Cognitive Status: Within Functional Limits for tasks assessed                      Exercises Total Joint Exercises Quad Sets: AROM;Left;10 reps;Seated Heel Slides: AAROM;Left;10 reps Hip ABduction/ADduction:  AAROM;Left;10 reps Straight Leg Raises: AAROM;Left;10 reps Long Arc Quad: AROM;Left;10 reps    General Comments        Pertinent Vitals/Pain      Home Living                      Prior Function            PT Goals (current goals can now be found in the care plan section) Progress towards PT goals: Progressing toward goals    Frequency  7X/week    PT Plan Current plan remains appropriate    Co-evaluation             End of Session Equipment Utilized During Treatment: Gait belt Activity Tolerance: Patient tolerated treatment well Patient left: in chair;with call bell/phone within reach     Time: 1037-1108 PT Time Calculation (min): 31 min  Charges:  $Gait Training: 8-22 mins $Therapeutic Exercise: 8-22 mins                    G Codes:      Jacqualyn Posey 02/25/2014, 2:40 PM  02/25/2014 Jacqualyn Posey PTA [phone] pager [phone] office

## 2014-02-25 NOTE — Progress Notes (Signed)
SPORTS MEDICINE AND JOINT REPLACEMENT  Monica Mulch, MD   Carlynn Spry, PA-C [place], [place], [place]  [address]                             [phone]   PROGRESS NOTE  Subjective:  negative for Chest Pain  negative for Shortness of Breath  negative for Nausea/Vomiting   negative for Calf Pain  negative for Bowel Movement   Tolerating Diet: yes         Patient reports pain as 3 on 0-10 scale.    Objective: Vital signs in last 24 hours:   Patient Vitals for the past 24 hrs:  BP Temp Temp src Pulse Resp SpO2 Height Weight  02/25/14 0553 128/43 mmHg 98.1 F (36.7 C) Oral 66 - 99 % - -  02/25/14 0400 - - - - 17 96 % - -  02/25/14 0153 111/49 mmHg 97.7 F (36.5 C) Oral 60 - - - -  02/25/14 0000 - - - - 17 98 % - -  02/24/14 2019 132/49 mmHg 97.2 F (36.2 C) Oral 76 - 100 % - -  02/24/14 2000 - - - - 17 97 % - -  02/24/14 1303 144/55 mmHg 97 F (36.1 C) - 74 14 98 % 5\' 7"  (1.702 m) -  02/24/14 1245 132/37 mmHg - - 69 11 99 % - -  02/24/14 1230 145/45 mmHg - - 75 10 98 % - -  02/24/14 1220 - 97.2 F (36.2 C) - 85 12 100 % - -  02/24/14 1215 144/49 mmHg - - 80 17 98 % - -  02/24/14 1200 154/49 mmHg - - 82 21 99 % - -  02/24/14 1145 151/56 mmHg - - 86 15 100 % - -  02/24/14 1130 169/73 mmHg - - 93 12 100 % - -  02/24/14 1115 159/66 mmHg - - 110 16 100 % - -  02/24/14 1111 159/66 mmHg 97.4 F (36.3 C) - 110 - - - -  02/24/14 0905 - - - 58 9 100 % - -  02/24/14 0900 139/46 mmHg - - 60 10 100 % - -  02/24/14 0855 - - - 65 15 100 % - -  02/24/14 0850 156/58 mmHg - - 68 15 100 % - -  02/24/14 0845 175/68 mmHg - - 62 13 100 % - -  02/24/14 0840 169/57 mmHg - - 67 17 100 % - -  02/24/14 0835 148/75 mmHg - - 67 15 100 % - -  02/24/14 0811 157/51 mmHg 96.9 F (36.1 C) Oral 67 18 99 % - 68.493 kg (151 lb)    @flow {1959:LAST@   Intake/Output from previous day:   08/10 0701 - 08/11 0700 In: 2284.1 [P.O.:340; I.V.:1894.1] Out: 600 [Drains:550]   Intake/Output  this shift:       Intake/Output     08/10 0701 - 08/11 0700 08/11 0701 - 08/12 0700   P.O. 340    I.V. (mL/kg) 1894.1 (27.7)    IV Piggyback 50    Total Intake(mL/kg) 2284.1 (33.3)    Drains 550    Blood 50    Total Output 600     Net +1684.1          Urine Occurrence 5 x       LABORATORY DATA:  Recent Labs  02/25/14 0445  WBC 18.6*  HGB 10.6*  HCT 33.5*  PLT  355    Recent Labs  02/25/14 0445  NA 139  K 5.6*  CL 102  CO2 24  BUN 14  CREATININE 0.63  GLUCOSE 157*  CALCIUM 8.5   Lab Results  Component Value Date   INR 0.89 02/24/2014   INR 1.47 02/17/2014   INR 1.00 09/30/2013    Examination:  General appearance: alert, cooperative and no distress Extremities: Homans sign is negative, no sign of DVT  Wound Exam: clean, dry, intact   Drainage:  Scant/small amount Serosanguinous exudate  Motor Exam: EHL and FHL Intact  Sensory Exam: Deep Peroneal normal   Assessment:    1 Day Post-Op  Procedure(s) (LRB): TOTAL KNEE ARTHROPLASTY (Left)  ADDITIONAL DIAGNOSIS:  Active Problems:   S/P total knee arthroplasty  Acute Blood Loss Anemia   Plan: Physical Therapy as ordered Weight Bearing as Tolerated (WBAT)  DVT Prophylaxis:  Xarelto  DISCHARGE PLAN: Home  DISCHARGE NEEDS: HHPT, CPM, Walker and 3-in-1 comode seat         Monica Williamson 02/25/2014, 7:26 AM

## 2014-02-25 NOTE — Progress Notes (Signed)
Physical Therapy Treatment Patient Details Name: Monica Williamson MRN: [id number] DOB: 1941/07/01 Today's Date: 02/25/2014    History of Present Illness Patient is a 73 yo female admitted 02/24/14 now s/p Lt TKA.  PMH:  Rt TKA, Afib, anxiety    PT Comments    Patient continues to be highly motivated and progressing well with overall mobility. Patient walking more cautiously this afternoon due to increased stiffness per her report. Patient planning to DC later this afternoon with assist from her husband  Follow Up Recommendations  Home health PT;Supervision/Assistance - 24 hour     Equipment Recommendations  None recommended by PT    Recommendations for Other Services       Precautions / Restrictions Restrictions LLE Weight Bearing: Weight bearing as tolerated    Mobility  Bed Mobility Overal bed mobility: Modified Independent                Transfers Overall transfer level: Needs assistance Equipment used: Rolling walker (2 wheeled) Transfers: Sit to/from Stand Sit to Stand: Supervision         General transfer comment: Supervision to ensure safety  Ambulation/Gait Ambulation/Gait assistance: Supervision Ambulation Distance (Feet): 300 Feet Assistive device: Rolling walker (2 wheeled) Gait Pattern/deviations: Step-through pattern;Decreased stride length Gait velocity: More decreased from AM due to stiffness per patient report   General Gait Details: Cues for RW positioning and not to step out too far   Stairs  General stair comments: declined further practice. Was able to practice in AM with husband  Wheelchair Mobility    Modified Rankin (Stroke Patients Only)       Balance                                    Cognition Arousal/Alertness: Awake/alert Behavior During Therapy: WFL for tasks assessed/performed Overall Cognitive Status: Within Functional Limits for tasks assessed                         General  Comments        Pertinent Vitals/Pain Pain Assessment: No/denies pain    Home Living                      Prior Function            PT Goals (current goals can now be found in the care plan section) Progress towards PT goals: Progressing toward goals    Frequency  7X/week    PT Plan Current plan remains appropriate    Co-evaluation             End of Session Equipment Utilized During Treatment: Gait belt Activity Tolerance: Patient tolerated treatment well Patient left: in bed;in CPM     Time: 1400-1426 PT Time Calculation (min): 26 min  Charges:  $Gait Training: 23-37 mins $Therapeutic Exercise: 8-22 mins                    G Codes:      Jacqualyn Posey 02/25/2014, 2:45 PM 02/25/2014 Jacqualyn Posey PTA [phone] pager [phone] office

## 2014-02-26 ENCOUNTER — Emergency Department (HOSPITAL_COMMUNITY)
Admission: EM | Admit: 2014-02-26 | Discharge: 2014-02-26 | Disposition: A | Discharge status: Home / Self Care | Payer: Medicare Other | Attending: Emergency Medicine | Admitting: Emergency Medicine

## 2014-02-26 ENCOUNTER — Encounter (HOSPITAL_COMMUNITY): Payer: Self-pay | Admitting: Orthopedic Surgery

## 2014-02-26 DIAGNOSIS — Z79899 Other long term (current) drug therapy: Secondary | ICD-10-CM | POA: Diagnosis not present

## 2014-02-26 DIAGNOSIS — Z8781 Personal history of (healed) traumatic fracture: Secondary | ICD-10-CM | POA: Insufficient documentation

## 2014-02-26 DIAGNOSIS — F3289 Other specified depressive episodes: Secondary | ICD-10-CM | POA: Diagnosis not present

## 2014-02-26 DIAGNOSIS — F329 Major depressive disorder, single episode, unspecified: Secondary | ICD-10-CM | POA: Diagnosis not present

## 2014-02-26 DIAGNOSIS — Z7901 Long term (current) use of anticoagulants: Secondary | ICD-10-CM | POA: Diagnosis not present

## 2014-02-26 DIAGNOSIS — Z87891 Personal history of nicotine dependence: Secondary | ICD-10-CM | POA: Diagnosis not present

## 2014-02-26 DIAGNOSIS — R6889 Other general symptoms and signs: Secondary | ICD-10-CM | POA: Diagnosis not present

## 2014-02-26 DIAGNOSIS — Z860100 Personal history of colon polyps, unspecified: Secondary | ICD-10-CM | POA: Insufficient documentation

## 2014-02-26 DIAGNOSIS — I959 Hypotension, unspecified: Secondary | ICD-10-CM | POA: Diagnosis not present

## 2014-02-26 DIAGNOSIS — Z8739 Personal history of other diseases of the musculoskeletal system and connective tissue: Secondary | ICD-10-CM | POA: Diagnosis not present

## 2014-02-26 DIAGNOSIS — T398X5A Adverse effect of other nonopioid analgesics and antipyretics, not elsewhere classified, initial encounter: Secondary | ICD-10-CM | POA: Insufficient documentation

## 2014-02-26 DIAGNOSIS — R4182 Altered mental status, unspecified: Secondary | ICD-10-CM | POA: Insufficient documentation

## 2014-02-26 DIAGNOSIS — I4891 Unspecified atrial fibrillation: Secondary | ICD-10-CM | POA: Diagnosis not present

## 2014-02-26 DIAGNOSIS — T50905A Adverse effect of unspecified drugs, medicaments and biological substances, initial encounter: Secondary | ICD-10-CM

## 2014-02-26 DIAGNOSIS — J45909 Unspecified asthma, uncomplicated: Secondary | ICD-10-CM | POA: Diagnosis not present

## 2014-02-26 DIAGNOSIS — I951 Orthostatic hypotension: Secondary | ICD-10-CM | POA: Diagnosis not present

## 2014-02-26 DIAGNOSIS — IMO0002 Reserved for concepts with insufficient information to code with codable children: Secondary | ICD-10-CM | POA: Diagnosis not present

## 2014-02-26 DIAGNOSIS — Z8719 Personal history of other diseases of the digestive system: Secondary | ICD-10-CM | POA: Insufficient documentation

## 2014-02-26 DIAGNOSIS — Z8639 Personal history of other endocrine, nutritional and metabolic disease: Secondary | ICD-10-CM | POA: Diagnosis not present

## 2014-02-26 DIAGNOSIS — R42 Dizziness and giddiness: Secondary | ICD-10-CM | POA: Diagnosis not present

## 2014-02-26 DIAGNOSIS — Z471 Aftercare following joint replacement surgery: Secondary | ICD-10-CM | POA: Diagnosis not present

## 2014-02-26 DIAGNOSIS — T50995A Adverse effect of other drugs, medicaments and biological substances, initial encounter: Secondary | ICD-10-CM | POA: Diagnosis not present

## 2014-02-26 DIAGNOSIS — R269 Unspecified abnormalities of gait and mobility: Secondary | ICD-10-CM | POA: Diagnosis not present

## 2014-02-26 DIAGNOSIS — F411 Generalized anxiety disorder: Secondary | ICD-10-CM | POA: Diagnosis not present

## 2014-02-26 DIAGNOSIS — Z862 Personal history of diseases of the blood and blood-forming organs and certain disorders involving the immune mechanism: Secondary | ICD-10-CM | POA: Insufficient documentation

## 2014-02-26 DIAGNOSIS — M1990 Unspecified osteoarthritis, unspecified site: Secondary | ICD-10-CM | POA: Diagnosis not present

## 2014-02-26 DIAGNOSIS — IMO0001 Reserved for inherently not codable concepts without codable children: Secondary | ICD-10-CM | POA: Diagnosis not present

## 2014-02-26 DIAGNOSIS — I10 Essential (primary) hypertension: Secondary | ICD-10-CM | POA: Diagnosis not present

## 2014-02-26 DIAGNOSIS — T3995XA Adverse effect of unspecified nonopioid analgesic, antipyretic and antirheumatic, initial encounter: Secondary | ICD-10-CM

## 2014-02-26 LAB — COMPREHENSIVE METABOLIC PANEL
ALT: 108 U/L — ABNORMAL HIGH (ref 0–35)
AST: 59 U/L — AB (ref 0–37)
Albumin: 2.9 g/dL — ABNORMAL LOW (ref 3.5–5.2)
Alkaline Phosphatase: 89 U/L (ref 39–117)
Anion gap: 9 (ref 5–15)
BUN: 21 mg/dL (ref 6–23)
CO2: 25 mEq/L (ref 19–32)
Calcium: 8.8 mg/dL (ref 8.4–10.5)
Chloride: 96 mEq/L (ref 96–112)
Creatinine, Ser: 1.05 mg/dL (ref 0.50–1.10)
GFR calc Af Amer: 59 mL/min — ABNORMAL LOW (ref >90)
GFR calc non Af Amer: 51 mL/min — ABNORMAL LOW (ref >90)
Glucose, Bld: 107 mg/dL — ABNORMAL HIGH (ref 70–99)
Potassium: 4.7 mEq/L (ref 3.7–5.3)
SODIUM: 130 meq/L — AB (ref 137–147)
Total Bilirubin: 0.3 mg/dL (ref 0.3–1.2)
Total Protein: 5.9 g/dL — ABNORMAL LOW (ref 6.0–8.3)

## 2014-02-26 LAB — CBC WITH DIFFERENTIAL/PLATELET
BASOS ABS: 0 10*3/uL (ref 0.0–0.1)
Basophils Relative: 0 % (ref 0–1)
EOS ABS: 0 10*3/uL (ref 0.0–0.7)
Eosinophils Relative: 0 % (ref 0–5)
HCT: 24.6 % — ABNORMAL LOW (ref 36.0–46.0)
Hemoglobin: 8 g/dL — ABNORMAL LOW (ref 12.0–15.0)
LYMPHS PCT: 8 % — AB (ref 12–46)
Lymphs Abs: 0.9 10*3/uL (ref 0.7–4.0)
MCH: 29.5 pg (ref 26.0–34.0)
MCHC: 32.5 g/dL (ref 30.0–36.0)
MCV: 90.8 fL (ref 78.0–100.0)
Monocytes Absolute: 1.1 10*3/uL — ABNORMAL HIGH (ref 0.1–1.0)
Monocytes Relative: 9 % (ref 3–12)
Neutro Abs: 9.8 10*3/uL — ABNORMAL HIGH (ref 1.7–7.7)
Neutrophils Relative %: 83 % — ABNORMAL HIGH (ref 43–77)
Platelets: 242 10*3/uL (ref 150–400)
RBC: 2.71 MIL/uL — ABNORMAL LOW (ref 3.87–5.11)
RDW: 18.2 % — AB (ref 11.5–15.5)
WBC: 11.8 10*3/uL — AB (ref 4.0–10.5)

## 2014-02-26 MED ORDER — SODIUM CHLORIDE 0.9 % IV SOLN: INTRAVENOUS | Status: DC

## 2014-02-26 MED ORDER — SODIUM CHLORIDE 0.9 % IV BOLUS (SEPSIS): 1000.0000 mL | Freq: Once | INTRAVENOUS | Status: DC

## 2014-02-26 MED ORDER — SODIUM CHLORIDE 0.9 % IV BOLUS (SEPSIS)
500.0000 mL | Freq: Once | INTRAVENOUS | Status: AC
  Administered 2014-02-26: 500 mL via INTRAVENOUS

## 2014-02-26 NOTE — ED Provider Notes (Signed)
CSN: [id number]     Arrival date & time 02/26/14  1311 History   First MD Initiated Contact with Patient 02/26/14 1323     Chief Complaint  Patient presents with  . Altered Mental Status  . Hypotension     (Consider location/radiation/quality/duration/timing/severity/associated sxs/prior Treatment) HPI Comments: Patient here after becoming more sleepy and drowsy after using pain medications on empty stomach. Patient recently had knee surgery and got home last night. No recent fever or chills. No vomiting diarrhea. Patient also took muscle axis along with this. EMS was called and patient to be slightly hypotensive and patient transported here. No medications given prior to arrival. Nothing made her symptoms better.  Patient is a 73 y.o. female presenting with altered mental status. The history is provided by the patient.  Altered Mental Status   Past Medical History  Diagnosis Date  . Hypertension   . Childhood asthma   . Diverticulosis   . Vitamin D deficiency   . Depression   . Anxiety   . Pulmonary nodule   . Nocturia   . Colon polyps   . Cholelithiasis   . Atrial fibrillation     when takes Warfarin  . Atrial fibrillation, currently in sinus rhythm   . Bronchiectasis     contolled with lovent and allergy med  . Sleep related teeth grinding     wears a mouth guard at night  . Stress fracture of tibia 2014  . Complication of anesthesia   . PONV (postoperative nausea and vomiting)     severe n/v post anesthesia   . Osteopenia   . Arthritis   . History of airborne allergies     uses inhalers  . H/O bronchiectasis   . Dysrhythmia     sees Dr Acie Fredrickson annually for h/o Afib   Past Surgical History  Procedure Laterality Date  . Vesicovaginal fistula closure w/ tah    . Incision and drainage breast abscess    . Vaginal hysterectomy    . Cholecystectomy      11/2010  . Breast surgery    . Meniscus repair Right 2014  . Total knee arthroplasty Right 09/30/2013   Procedure: RIGHT TOTAL KNEE ARTHROPLASTY;  Surgeon: Vickey Huger, MD;  Location: [facility];  Service: Orthopedics;  Laterality: Right;  . Joint replacement Right 09/2013    knee  . Total knee arthroplasty Left 02/24/2014    dr Ronnie Derby  . Total knee arthroplasty Left 02/24/2014    Procedure: TOTAL KNEE ARTHROPLASTY;  Surgeon: Vickey Huger, MD;  Location: [facility];  Service: Orthopedics;  Laterality: Left;   Family History  Problem Relation Age of Onset  . Heart disease Sister   . Colon cancer Neg Hx    History  Substance Use Topics  . Smoking status: Former Smoker -- 1.50 packs/day for 20 years    Quit date: 07/18/1978  . Smokeless tobacco: Never Used  . Alcohol Use: Not on file     Comment: daily cocktail   OB History   Grav Para Term Preterm Abortions TAB SAB Ect Mult Living                 Review of Systems  All other systems reviewed and are negative.     Allergies  Warfarin and related and Avelox  Home Medications   Prior to Admission medications   Medication Sig Start Date End Date Taking? Authorizing Provider  albuterol (PROVENTIL HFA;VENTOLIN HFA) 108 (90 BASE) MCG/ACT inhaler Inhale 2 puffs into  the lungs 2 (two) times daily. 01/22/12 02/17/14  Rosana Hoes, MD  ALPRAZolam Duanne Moron) 0.5 MG tablet Take 0.25 mg by mouth at bedtime.     Historical Provider, MD  buPROPion (WELLBUTRIN XL) 150 MG 24 hr tablet Take 1 tablet (150 mg total) by mouth daily. 10/26/10   Thayer Headings, MD  Calcium Carbonate-Vit D-Min (CALCIUM 1200) 1200-1000 MG-UNIT CHEW Chew 1 tablet by mouth daily.    Historical Provider, MD  Cholecalciferol (VITAMIN D) 1000 UNITS capsule Take 1,000 Units by mouth 2 (two) times daily.  10/26/10   Thayer Headings, MD  diltiazem (DILT-XR) 180 MG 24 hr capsule Take 1 capsule (180 mg total) by mouth daily. 12/29/13   Liliane Shi, PA-C  fluticasone (FLOVENT HFA) 110 MCG/ACT inhaler Inhale 2 puffs into the lungs daily.    Historical Provider, MD  loratadine (CLARITIN) 10 MG tablet  Take 10 mg by mouth daily.    Historical Provider, MD  losartan (COZAAR) 50 MG tablet Take 1 tablet (50 mg total) by mouth daily. 10/26/10   Thayer Headings, MD  oxyCODONE (OXY IR/ROXICODONE) 5 MG immediate release tablet Take 1-2 tablets (5-10 mg total) by mouth every 3 (three) hours as needed for breakthrough pain. 02/25/14   Carlynn Spry, PA-C  propranolol (INDERAL) 10 MG tablet Take 1 tablet (10 mg total) by mouth 4 (four) times daily as needed (FOR PALPITATIONS Can take ONE every 30 minutes Up to 4 Doses). 12/29/13   Liliane Shi, PA-C  Rivaroxaban (XARELTO) 20 MG TABS tablet Take 20 mg by mouth daily with supper.    Historical Provider, MD  rivaroxaban (XARELTO) 20 MG TABS tablet Take 1 tablet (20 mg total) by mouth daily with supper. 02/25/14   Carlynn Spry, PA-C  tiZANidine (ZANAFLEX) 4 MG tablet Take 1 tablet (4 mg total) by mouth every 8 (eight) hours as needed. 02/25/14   Carlynn Spry, PA-C   BP 97/40  Pulse 59  Temp(Src) 98.4 F (36.9 C) (Oral)  SpO2 95% Physical Exam  Nursing note and vitals reviewed. Constitutional: She is oriented to person, place, and time. She appears well-developed and well-nourished.  Non-toxic appearance. No distress.  HENT:  Head: Normocephalic and atraumatic.  Eyes: Conjunctivae, EOM and lids are normal. Pupils are equal, round, and reactive to light.  Neck: Normal range of motion. Neck supple. No tracheal deviation present. No mass present.  Cardiovascular: Normal rate, regular rhythm and normal heart sounds.  Exam reveals no gallop.   No murmur heard. Pulmonary/Chest: Effort normal and breath sounds normal. No stridor. No respiratory distress. She has no decreased breath sounds. She has no wheezes. She has no rhonchi. She has no rales.  Abdominal: Soft. Normal appearance and bowel sounds are normal. She exhibits no distension. There is no tenderness. There is no rebound and no CVA tenderness.  Musculoskeletal: Normal range of motion. She exhibits no  edema and no tenderness.  Neurological: She is alert and oriented to person, place, and time. She has normal strength. No cranial nerve deficit or sensory deficit. GCS eye subscore is 4. GCS verbal subscore is 5. GCS motor subscore is 6.  Skin: Skin is warm and dry. No abrasion and no rash noted.  Psychiatric: She has a normal mood and affect. Her speech is normal and behavior is normal.    ED Course  Procedures (including critical care time) Labs Review Labs Reviewed  CULTURE, BLOOD (ROUTINE X 2)  CULTURE, BLOOD (ROUTINE X 2)  CBC WITH DIFFERENTIAL  COMPREHENSIVE METABOLIC PANEL  I-STAT CG4 LACTIC ACID, ED    Imaging Review No results found.   EKG Interpretation None      MDM   Final diagnoses:  None    Spoke with patient and her husband at length and patient likely is having a reaction to her opiate medications. She is afebrile here. Do not believe that she is septic bacteremic. Patient also seen by her orthopedist who agrees with my assessment. Patient to be monitored here    3:15 PM Pt rechecked and alert and oriented x4. Blood pressure stable. Patient will be discharged soon  Leota Jacobsen, MD 02/26/14 1515

## 2014-02-26 NOTE — ED Notes (Signed)
Bed: WA10 Expected date:  Expected time:  Means of arrival:  Comments: EMS 

## 2014-02-26 NOTE — Discharge Instructions (Signed)
Make sure you take your pain medication with food.

## 2014-02-26 NOTE — ED Notes (Signed)
Pt's husband called stating that patient has had problems with her bowels moving since before her surgery on Monday.  He is requesting if we could write for her some Miralax.

## 2014-02-26 NOTE — Care Management Note (Signed)
CARE MANAGEMENT NOTE 02/26/2014  Patient:  Monica Williamson, Monica Williamson   Account Number:  [id number]  Date Initiated:  02/25/2014  Documentation initiated by:  Ricki Miller  Subjective/Objective Assessment:   73yr old female s/p left total knee arthroplasty.     Action/Plan:   Patient was preoperatively setup with [facility]. No changes. Has family support at discharge. Patient has rolling walker and 3in1.   Anticipated DC Date:  02/25/2014   Anticipated DC Plan:  [place]  CM consult      PAC Choice  [place]   Choice offered to / List presented to:  C-1 Patient   DME arranged  CPM      DME agency  [employer]     [place] arranged  HH-2 PT      [place]   Status of service:  Completed, signed off Medicare Important Message given?  NA - LOS <3 / Initial given by admissions (If response is "NO", the following Medicare IM given date fields will be blank) Date Medicare IM given:   Medicare IM given by:   Date Additional Medicare IM given:   Additional Medicare IM given by:    Discharge Disposition:  [place]  Per UR Regulation:  Reviewed for med. necessity/level of care/duration of stay  If discussed at [place] of Stay Meetings, dates discussed:    Comments:

## 2014-02-26 NOTE — ED Notes (Signed)
pers ems: the patient takes percocet normally, however today she is getting "loopy" today even after she took the meds about 5 hours ago. The Home health Rn got a low blood pressure and ems got a low BP

## 2014-02-27 DIAGNOSIS — M1990 Unspecified osteoarthritis, unspecified site: Secondary | ICD-10-CM | POA: Diagnosis not present

## 2014-02-27 DIAGNOSIS — I10 Essential (primary) hypertension: Secondary | ICD-10-CM | POA: Diagnosis not present

## 2014-02-27 DIAGNOSIS — IMO0001 Reserved for inherently not codable concepts without codable children: Secondary | ICD-10-CM | POA: Diagnosis not present

## 2014-02-27 DIAGNOSIS — R269 Unspecified abnormalities of gait and mobility: Secondary | ICD-10-CM | POA: Diagnosis not present

## 2014-02-27 DIAGNOSIS — I4891 Unspecified atrial fibrillation: Secondary | ICD-10-CM | POA: Diagnosis not present

## 2014-02-27 DIAGNOSIS — Z471 Aftercare following joint replacement surgery: Secondary | ICD-10-CM | POA: Diagnosis not present

## 2014-02-27 NOTE — Addendum Note (Signed)
Addendum created 02/27/14 6659 by Finis Bud, MD   Modules edited: Anesthesia Responsible Staff

## 2014-02-28 DIAGNOSIS — IMO0001 Reserved for inherently not codable concepts without codable children: Secondary | ICD-10-CM | POA: Diagnosis not present

## 2014-02-28 DIAGNOSIS — M1990 Unspecified osteoarthritis, unspecified site: Secondary | ICD-10-CM | POA: Diagnosis not present

## 2014-02-28 DIAGNOSIS — R269 Unspecified abnormalities of gait and mobility: Secondary | ICD-10-CM | POA: Diagnosis not present

## 2014-02-28 DIAGNOSIS — I10 Essential (primary) hypertension: Secondary | ICD-10-CM | POA: Diagnosis not present

## 2014-02-28 DIAGNOSIS — I4891 Unspecified atrial fibrillation: Secondary | ICD-10-CM | POA: Diagnosis not present

## 2014-02-28 DIAGNOSIS — Z471 Aftercare following joint replacement surgery: Secondary | ICD-10-CM | POA: Diagnosis not present

## 2014-03-03 DIAGNOSIS — I10 Essential (primary) hypertension: Secondary | ICD-10-CM | POA: Diagnosis not present

## 2014-03-03 DIAGNOSIS — I4891 Unspecified atrial fibrillation: Secondary | ICD-10-CM | POA: Diagnosis not present

## 2014-03-03 DIAGNOSIS — R269 Unspecified abnormalities of gait and mobility: Secondary | ICD-10-CM | POA: Diagnosis not present

## 2014-03-03 DIAGNOSIS — M1990 Unspecified osteoarthritis, unspecified site: Secondary | ICD-10-CM | POA: Diagnosis not present

## 2014-03-03 DIAGNOSIS — IMO0001 Reserved for inherently not codable concepts without codable children: Secondary | ICD-10-CM | POA: Diagnosis not present

## 2014-03-03 DIAGNOSIS — Z471 Aftercare following joint replacement surgery: Secondary | ICD-10-CM | POA: Diagnosis not present

## 2014-03-04 DIAGNOSIS — Z471 Aftercare following joint replacement surgery: Secondary | ICD-10-CM | POA: Diagnosis not present

## 2014-03-04 DIAGNOSIS — I10 Essential (primary) hypertension: Secondary | ICD-10-CM | POA: Diagnosis not present

## 2014-03-04 DIAGNOSIS — M1990 Unspecified osteoarthritis, unspecified site: Secondary | ICD-10-CM | POA: Diagnosis not present

## 2014-03-04 DIAGNOSIS — IMO0001 Reserved for inherently not codable concepts without codable children: Secondary | ICD-10-CM | POA: Diagnosis not present

## 2014-03-04 DIAGNOSIS — R269 Unspecified abnormalities of gait and mobility: Secondary | ICD-10-CM | POA: Diagnosis not present

## 2014-03-04 DIAGNOSIS — I4891 Unspecified atrial fibrillation: Secondary | ICD-10-CM | POA: Diagnosis not present

## 2014-03-04 NOTE — Discharge Summary (Signed)
SPORTS MEDICINE & JOINT REPLACEMENT   [name], MD   Carlynn Spry, PA-C [place], [place],   [address]                             [phone]  PATIENT ID: Monica Williamson        MRN:  [id number]          DOB/AGE: [birth year] / 73 y.o.    DISCHARGE SUMMARY  ADMISSION DATE:    02/24/2014 DISCHARGE DATE:   02/25/2014  ADMISSION DIAGNOSIS: osteoarthritis left knee    DISCHARGE DIAGNOSIS:  osteoarthritis left knee    ADDITIONAL DIAGNOSIS: Active Problems:   S/P total knee arthroplasty  Past Medical History  Diagnosis Date  . Hypertension   . Childhood asthma   . Diverticulosis   . Vitamin D deficiency   . Depression   . Anxiety   . Pulmonary nodule   . Nocturia   . Colon polyps   . Cholelithiasis   . Atrial fibrillation     when takes Warfarin  . Atrial fibrillation, currently in sinus rhythm   . Bronchiectasis     contolled with lovent and allergy med  . Sleep related teeth grinding     wears a mouth guard at night  . Stress fracture of tibia 2014  . Complication of anesthesia   . PONV (postoperative nausea and vomiting)     severe n/v post anesthesia   . Osteopenia   . Arthritis   . History of airborne allergies     uses inhalers  . H/O bronchiectasis   . Dysrhythmia     sees Dr Acie Fredrickson annually for h/o Afib    PROCEDURE: Procedure(s): TOTAL KNEE ARTHROPLASTY on 02/24/2014  CONSULTS:     HISTORY:  See H&P in chart  HOSPITAL COURSE:  Monica Williamson is a 73 y.o. admitted on 02/24/2014 and found to have a diagnosis of osteoarthritis left knee.  After appropriate laboratory studies were obtained  they were taken to the operating room on 02/24/2014 and underwent Procedure(s): TOTAL KNEE ARTHROPLASTY.   They were given perioperative antibiotics:  Anti-infectives   Start     Dose/Rate Route Frequency Ordered Stop   02/24/14 1300  ceFAZolin (ANCEF) IVPB 1 g/50 mL premix     1 g 100 mL/hr over 30 Minutes Intravenous Every 6  hours 02/24/14 1252 02/24/14 2104   02/24/14 0600  ceFAZolin (ANCEF) IVPB 2 g/50 mL premix     2 g 100 mL/hr over 30 Minutes Intravenous On call to O.R. 02/23/14 1421 02/24/14 0912    .  Tolerated the procedure well.  Placed with a foley intraoperatively.  Given Ofirmev at induction and for 48 hours.    POD# 1: Vital signs were stable.  Patient denied Chest pain, shortness of breath, or calf pain.  Patient was started on Lovenox 30 mg subcutaneously twice daily at 8am.  Consults to PT, OT, and care management were made.  The patient was weight bearing as tolerated.  CPM was placed on the operative leg 0-90 degrees for 6-8 hours a day.  Incentive spirometry was taught.  Dressing was changed.  Hemovac was discontinued.     Continued  PT for ambulation and exercise program.  IV saline locked.  O2 discontinued.    The remainder of the hospital course was dedicated to ambulation and strengthening.   The patient was discharged on 1 day post op in  Good condition.  Blood products given:none  DIAGNOSTIC STUDIES: Recent vital signs: No data found.      Recent laboratory studies:  Recent Labs  02/26/14 1404  WBC 11.8*  HGB 8.0*  HCT 24.6*  PLT 242    Recent Labs  02/26/14 1404  NA 130*  K 4.7  CL 96  CO2 25  BUN 21  CREATININE 1.05  GLUCOSE 107*  CALCIUM 8.8   Lab Results  Component Value Date   INR 0.89 02/24/2014   INR 1.47 02/17/2014   INR 1.00 09/30/2013     Recent Radiographic Studies :  No results found.  DISCHARGE INSTRUCTIONS: Discharge Instructions   CPM    Complete by:  As directed   Continuous passive motion machine (CPM):      Use the CPM from 0 to 90 for 6-8 hours per day.      You may increase by 10 per day.  You may break it up into 2 or 3 sessions per day.      Use CPM for 2 weeks or until you are told to stop.     Call MD / Call 911    Complete by:  As directed   If you experience chest pain or shortness of breath, CALL 911 and be transported to the  hospital emergency room.  If you develope a fever above 101 F, pus (white drainage) or increased drainage or redness at the wound, or calf pain, call your surgeon's office.     Change dressing    Complete by:  As directed   Change dressing on Wednesday, then change the dressing daily with sterile 4 x 4 inch gauze dressing and apply TED hose.     Constipation Prevention    Complete by:  As directed   Drink plenty of fluids.  Prune juice may be helpful.  You may use a stool softener, such as Colace (over the counter) 100 mg twice a day.  Use MiraLax (over the counter) for constipation as needed.     Diet - low sodium heart healthy    Complete by:  As directed      Do not put a pillow under the knee. Place it under the heel.    Complete by:  As directed      Driving restrictions    Complete by:  As directed   No driving for 6 weeks     Increase activity slowly as tolerated    Complete by:  As directed      Lifting restrictions    Complete by:  As directed   No lifting for 6 weeks     TED hose    Complete by:  As directed   Use stockings (TED hose) for 3 weeks on both leg(s).  You may remove them at night for sleeping.           DISCHARGE MEDICATIONS:     Medication List         ALPRAZolam 0.5 MG tablet  Commonly known as:  XANAX  Take 0.25 mg by mouth at bedtime as needed for anxiety or sleep.     buPROPion 150 MG 24 hr tablet  Commonly known as:  WELLBUTRIN XL  Take 150 mg by mouth every morning.     CALCIUM 1200 1200-1000 MG-UNIT Chew  Chew 1 tablet by mouth every morning.     fluticasone 110 MCG/ACT inhaler  Commonly known as:  FLOVENT HFA  Inhale 2 puffs into the  lungs 2 (two) times daily.     oxyCODONE 5 MG immediate release tablet  Commonly known as:  Oxy IR/ROXICODONE  Take 1-2 tablets (5-10 mg total) by mouth every 3 (three) hours as needed for breakthrough pain.     propranolol 10 MG tablet  Commonly known as:  INDERAL  Take 1 tablet (10 mg total) by mouth 4  (four) times daily as needed (FOR PALPITATIONS Can take ONE every 30 minutes Up to 4 Doses).     Vitamin D 1000 UNITS capsule  Take 1,000 Units by mouth 2 (two) times daily.     XARELTO 20 MG Tabs tablet  Generic drug:  rivaroxaban  Take 20 mg by mouth daily with supper.        FOLLOW UP VISIT:       Follow-up Information   Follow up with Rudean Haskell, MD. Call on 03/11/2014.   Specialty:  Orthopedic Surgery   Contact information:   [address] [place] [address] [phone]       DISPOSITION: HOME   CONDITION:  Good   Lequan Dobratz 03/04/2014, 8:29 AM

## 2014-03-05 DIAGNOSIS — R269 Unspecified abnormalities of gait and mobility: Secondary | ICD-10-CM | POA: Diagnosis not present

## 2014-03-05 DIAGNOSIS — I10 Essential (primary) hypertension: Secondary | ICD-10-CM | POA: Diagnosis not present

## 2014-03-05 DIAGNOSIS — IMO0001 Reserved for inherently not codable concepts without codable children: Secondary | ICD-10-CM | POA: Diagnosis not present

## 2014-03-05 DIAGNOSIS — M1990 Unspecified osteoarthritis, unspecified site: Secondary | ICD-10-CM | POA: Diagnosis not present

## 2014-03-05 DIAGNOSIS — I4891 Unspecified atrial fibrillation: Secondary | ICD-10-CM | POA: Diagnosis not present

## 2014-03-05 DIAGNOSIS — Z471 Aftercare following joint replacement surgery: Secondary | ICD-10-CM | POA: Diagnosis not present

## 2014-03-06 DIAGNOSIS — IMO0001 Reserved for inherently not codable concepts without codable children: Secondary | ICD-10-CM | POA: Diagnosis not present

## 2014-03-06 DIAGNOSIS — R269 Unspecified abnormalities of gait and mobility: Secondary | ICD-10-CM | POA: Diagnosis not present

## 2014-03-06 DIAGNOSIS — I4891 Unspecified atrial fibrillation: Secondary | ICD-10-CM | POA: Diagnosis not present

## 2014-03-06 DIAGNOSIS — Z471 Aftercare following joint replacement surgery: Secondary | ICD-10-CM | POA: Diagnosis not present

## 2014-03-06 DIAGNOSIS — I10 Essential (primary) hypertension: Secondary | ICD-10-CM | POA: Diagnosis not present

## 2014-03-06 DIAGNOSIS — M1990 Unspecified osteoarthritis, unspecified site: Secondary | ICD-10-CM | POA: Diagnosis not present

## 2014-03-07 DIAGNOSIS — R269 Unspecified abnormalities of gait and mobility: Secondary | ICD-10-CM | POA: Diagnosis not present

## 2014-03-07 DIAGNOSIS — IMO0001 Reserved for inherently not codable concepts without codable children: Secondary | ICD-10-CM | POA: Diagnosis not present

## 2014-03-07 DIAGNOSIS — I10 Essential (primary) hypertension: Secondary | ICD-10-CM | POA: Diagnosis not present

## 2014-03-07 DIAGNOSIS — I4891 Unspecified atrial fibrillation: Secondary | ICD-10-CM | POA: Diagnosis not present

## 2014-03-07 DIAGNOSIS — M1990 Unspecified osteoarthritis, unspecified site: Secondary | ICD-10-CM | POA: Diagnosis not present

## 2014-03-07 DIAGNOSIS — Z471 Aftercare following joint replacement surgery: Secondary | ICD-10-CM | POA: Diagnosis not present

## 2014-03-10 DIAGNOSIS — I10 Essential (primary) hypertension: Secondary | ICD-10-CM | POA: Diagnosis not present

## 2014-03-10 DIAGNOSIS — Z471 Aftercare following joint replacement surgery: Secondary | ICD-10-CM | POA: Diagnosis not present

## 2014-03-10 DIAGNOSIS — M1990 Unspecified osteoarthritis, unspecified site: Secondary | ICD-10-CM | POA: Diagnosis not present

## 2014-03-10 DIAGNOSIS — I4891 Unspecified atrial fibrillation: Secondary | ICD-10-CM | POA: Diagnosis not present

## 2014-03-10 DIAGNOSIS — R269 Unspecified abnormalities of gait and mobility: Secondary | ICD-10-CM | POA: Diagnosis not present

## 2014-03-10 DIAGNOSIS — IMO0001 Reserved for inherently not codable concepts without codable children: Secondary | ICD-10-CM | POA: Diagnosis not present

## 2014-03-11 DIAGNOSIS — Z471 Aftercare following joint replacement surgery: Secondary | ICD-10-CM | POA: Diagnosis not present

## 2014-03-11 DIAGNOSIS — Z96659 Presence of unspecified artificial knee joint: Secondary | ICD-10-CM | POA: Diagnosis not present

## 2014-03-12 DIAGNOSIS — IMO0001 Reserved for inherently not codable concepts without codable children: Secondary | ICD-10-CM | POA: Diagnosis not present

## 2014-03-12 DIAGNOSIS — M1990 Unspecified osteoarthritis, unspecified site: Secondary | ICD-10-CM | POA: Diagnosis not present

## 2014-03-12 DIAGNOSIS — I10 Essential (primary) hypertension: Secondary | ICD-10-CM | POA: Diagnosis not present

## 2014-03-12 DIAGNOSIS — I4891 Unspecified atrial fibrillation: Secondary | ICD-10-CM | POA: Diagnosis not present

## 2014-03-12 DIAGNOSIS — M810 Age-related osteoporosis without current pathological fracture: Secondary | ICD-10-CM | POA: Diagnosis not present

## 2014-03-12 DIAGNOSIS — Z471 Aftercare following joint replacement surgery: Secondary | ICD-10-CM | POA: Diagnosis not present

## 2014-03-12 DIAGNOSIS — IMO0002 Reserved for concepts with insufficient information to code with codable children: Secondary | ICD-10-CM | POA: Diagnosis not present

## 2014-03-12 DIAGNOSIS — R269 Unspecified abnormalities of gait and mobility: Secondary | ICD-10-CM | POA: Diagnosis not present

## 2014-03-13 DIAGNOSIS — M25669 Stiffness of unspecified knee, not elsewhere classified: Secondary | ICD-10-CM | POA: Diagnosis not present

## 2014-03-13 DIAGNOSIS — Z96659 Presence of unspecified artificial knee joint: Secondary | ICD-10-CM | POA: Diagnosis not present

## 2014-03-13 DIAGNOSIS — M25569 Pain in unspecified knee: Secondary | ICD-10-CM | POA: Diagnosis not present

## 2014-03-13 DIAGNOSIS — R262 Difficulty in walking, not elsewhere classified: Secondary | ICD-10-CM | POA: Diagnosis not present

## 2014-03-19 DIAGNOSIS — R262 Difficulty in walking, not elsewhere classified: Secondary | ICD-10-CM | POA: Diagnosis not present

## 2014-03-19 DIAGNOSIS — M25569 Pain in unspecified knee: Secondary | ICD-10-CM | POA: Diagnosis not present

## 2014-03-19 DIAGNOSIS — Z96659 Presence of unspecified artificial knee joint: Secondary | ICD-10-CM | POA: Diagnosis not present

## 2014-03-19 DIAGNOSIS — M25669 Stiffness of unspecified knee, not elsewhere classified: Secondary | ICD-10-CM | POA: Diagnosis not present

## 2014-03-21 DIAGNOSIS — R262 Difficulty in walking, not elsewhere classified: Secondary | ICD-10-CM | POA: Diagnosis not present

## 2014-03-21 DIAGNOSIS — M25569 Pain in unspecified knee: Secondary | ICD-10-CM | POA: Diagnosis not present

## 2014-03-21 DIAGNOSIS — M25669 Stiffness of unspecified knee, not elsewhere classified: Secondary | ICD-10-CM | POA: Diagnosis not present

## 2014-03-21 DIAGNOSIS — Z96659 Presence of unspecified artificial knee joint: Secondary | ICD-10-CM | POA: Diagnosis not present

## 2014-03-26 DIAGNOSIS — R262 Difficulty in walking, not elsewhere classified: Secondary | ICD-10-CM | POA: Diagnosis not present

## 2014-03-26 DIAGNOSIS — D485 Neoplasm of uncertain behavior of skin: Secondary | ICD-10-CM | POA: Diagnosis not present

## 2014-03-26 DIAGNOSIS — D239 Other benign neoplasm of skin, unspecified: Secondary | ICD-10-CM | POA: Diagnosis not present

## 2014-03-26 DIAGNOSIS — M25669 Stiffness of unspecified knee, not elsewhere classified: Secondary | ICD-10-CM | POA: Diagnosis not present

## 2014-03-26 DIAGNOSIS — L570 Actinic keratosis: Secondary | ICD-10-CM | POA: Diagnosis not present

## 2014-03-26 DIAGNOSIS — D1801 Hemangioma of skin and subcutaneous tissue: Secondary | ICD-10-CM | POA: Diagnosis not present

## 2014-03-26 DIAGNOSIS — L821 Other seborrheic keratosis: Secondary | ICD-10-CM | POA: Diagnosis not present

## 2014-03-26 DIAGNOSIS — Z96659 Presence of unspecified artificial knee joint: Secondary | ICD-10-CM | POA: Diagnosis not present

## 2014-03-26 DIAGNOSIS — M25569 Pain in unspecified knee: Secondary | ICD-10-CM | POA: Diagnosis not present

## 2014-03-26 DIAGNOSIS — D692 Other nonthrombocytopenic purpura: Secondary | ICD-10-CM | POA: Diagnosis not present

## 2014-03-28 DIAGNOSIS — Z96659 Presence of unspecified artificial knee joint: Secondary | ICD-10-CM | POA: Diagnosis not present

## 2014-03-28 DIAGNOSIS — M25569 Pain in unspecified knee: Secondary | ICD-10-CM | POA: Diagnosis not present

## 2014-03-28 DIAGNOSIS — R262 Difficulty in walking, not elsewhere classified: Secondary | ICD-10-CM | POA: Diagnosis not present

## 2014-03-28 DIAGNOSIS — M25669 Stiffness of unspecified knee, not elsewhere classified: Secondary | ICD-10-CM | POA: Diagnosis not present

## 2014-04-01 DIAGNOSIS — M25569 Pain in unspecified knee: Secondary | ICD-10-CM | POA: Diagnosis not present

## 2014-04-01 DIAGNOSIS — M25669 Stiffness of unspecified knee, not elsewhere classified: Secondary | ICD-10-CM | POA: Diagnosis not present

## 2014-04-01 DIAGNOSIS — Z96659 Presence of unspecified artificial knee joint: Secondary | ICD-10-CM | POA: Diagnosis not present

## 2014-04-01 DIAGNOSIS — R262 Difficulty in walking, not elsewhere classified: Secondary | ICD-10-CM | POA: Diagnosis not present

## 2014-04-04 DIAGNOSIS — R262 Difficulty in walking, not elsewhere classified: Secondary | ICD-10-CM | POA: Diagnosis not present

## 2014-04-04 DIAGNOSIS — M25669 Stiffness of unspecified knee, not elsewhere classified: Secondary | ICD-10-CM | POA: Diagnosis not present

## 2014-04-04 DIAGNOSIS — Z96659 Presence of unspecified artificial knee joint: Secondary | ICD-10-CM | POA: Diagnosis not present

## 2014-04-04 DIAGNOSIS — M25569 Pain in unspecified knee: Secondary | ICD-10-CM | POA: Diagnosis not present

## 2014-04-08 ENCOUNTER — Other Ambulatory Visit: Payer: Self-pay | Admitting: Cardiovascular Disease

## 2014-04-08 DIAGNOSIS — M25669 Stiffness of unspecified knee, not elsewhere classified: Secondary | ICD-10-CM | POA: Diagnosis not present

## 2014-04-08 DIAGNOSIS — Z96659 Presence of unspecified artificial knee joint: Secondary | ICD-10-CM | POA: Diagnosis not present

## 2014-04-08 DIAGNOSIS — M25569 Pain in unspecified knee: Secondary | ICD-10-CM | POA: Diagnosis not present

## 2014-04-08 DIAGNOSIS — R262 Difficulty in walking, not elsewhere classified: Secondary | ICD-10-CM | POA: Diagnosis not present

## 2014-04-11 DIAGNOSIS — R262 Difficulty in walking, not elsewhere classified: Secondary | ICD-10-CM | POA: Diagnosis not present

## 2014-04-11 DIAGNOSIS — M25669 Stiffness of unspecified knee, not elsewhere classified: Secondary | ICD-10-CM | POA: Diagnosis not present

## 2014-04-11 DIAGNOSIS — Z96659 Presence of unspecified artificial knee joint: Secondary | ICD-10-CM | POA: Diagnosis not present

## 2014-04-11 DIAGNOSIS — M25569 Pain in unspecified knee: Secondary | ICD-10-CM | POA: Diagnosis not present

## 2014-04-15 DIAGNOSIS — M25669 Stiffness of unspecified knee, not elsewhere classified: Secondary | ICD-10-CM | POA: Diagnosis not present

## 2014-04-15 DIAGNOSIS — Z96659 Presence of unspecified artificial knee joint: Secondary | ICD-10-CM | POA: Diagnosis not present

## 2014-04-15 DIAGNOSIS — R262 Difficulty in walking, not elsewhere classified: Secondary | ICD-10-CM | POA: Diagnosis not present

## 2014-04-15 DIAGNOSIS — M25569 Pain in unspecified knee: Secondary | ICD-10-CM | POA: Diagnosis not present

## 2014-04-17 DIAGNOSIS — Z96651 Presence of right artificial knee joint: Secondary | ICD-10-CM | POA: Diagnosis not present

## 2014-04-17 DIAGNOSIS — M25661 Stiffness of right knee, not elsewhere classified: Secondary | ICD-10-CM | POA: Diagnosis not present

## 2014-04-17 DIAGNOSIS — R262 Difficulty in walking, not elsewhere classified: Secondary | ICD-10-CM | POA: Diagnosis not present

## 2014-04-17 DIAGNOSIS — M25561 Pain in right knee: Secondary | ICD-10-CM | POA: Diagnosis not present

## 2014-04-30 DIAGNOSIS — Z23 Encounter for immunization: Secondary | ICD-10-CM | POA: Diagnosis not present

## 2014-05-02 DIAGNOSIS — Z96651 Presence of right artificial knee joint: Secondary | ICD-10-CM | POA: Diagnosis not present

## 2014-05-02 DIAGNOSIS — Z96652 Presence of left artificial knee joint: Secondary | ICD-10-CM | POA: Diagnosis not present

## 2014-05-02 DIAGNOSIS — M25562 Pain in left knee: Secondary | ICD-10-CM | POA: Diagnosis not present

## 2014-05-02 DIAGNOSIS — M25662 Stiffness of left knee, not elsewhere classified: Secondary | ICD-10-CM | POA: Diagnosis not present

## 2014-05-02 DIAGNOSIS — R262 Difficulty in walking, not elsewhere classified: Secondary | ICD-10-CM | POA: Diagnosis not present

## 2014-05-06 DIAGNOSIS — R262 Difficulty in walking, not elsewhere classified: Secondary | ICD-10-CM | POA: Diagnosis not present

## 2014-05-06 DIAGNOSIS — M25661 Stiffness of right knee, not elsewhere classified: Secondary | ICD-10-CM | POA: Diagnosis not present

## 2014-05-06 DIAGNOSIS — Z96651 Presence of right artificial knee joint: Secondary | ICD-10-CM | POA: Diagnosis not present

## 2014-05-06 DIAGNOSIS — M25561 Pain in right knee: Secondary | ICD-10-CM | POA: Diagnosis not present

## 2014-05-13 DIAGNOSIS — M25561 Pain in right knee: Secondary | ICD-10-CM | POA: Diagnosis not present

## 2014-05-13 DIAGNOSIS — Z96651 Presence of right artificial knee joint: Secondary | ICD-10-CM | POA: Diagnosis not present

## 2014-05-13 DIAGNOSIS — R262 Difficulty in walking, not elsewhere classified: Secondary | ICD-10-CM | POA: Diagnosis not present

## 2014-05-13 DIAGNOSIS — M25661 Stiffness of right knee, not elsewhere classified: Secondary | ICD-10-CM | POA: Diagnosis not present

## 2014-05-16 DIAGNOSIS — M25661 Stiffness of right knee, not elsewhere classified: Secondary | ICD-10-CM | POA: Diagnosis not present

## 2014-05-16 DIAGNOSIS — Z96651 Presence of right artificial knee joint: Secondary | ICD-10-CM | POA: Diagnosis not present

## 2014-05-16 DIAGNOSIS — R262 Difficulty in walking, not elsewhere classified: Secondary | ICD-10-CM | POA: Diagnosis not present

## 2014-05-16 DIAGNOSIS — M25561 Pain in right knee: Secondary | ICD-10-CM | POA: Diagnosis not present

## 2014-05-20 DIAGNOSIS — M25661 Stiffness of right knee, not elsewhere classified: Secondary | ICD-10-CM | POA: Diagnosis not present

## 2014-05-20 DIAGNOSIS — M25561 Pain in right knee: Secondary | ICD-10-CM | POA: Diagnosis not present

## 2014-05-20 DIAGNOSIS — Z96651 Presence of right artificial knee joint: Secondary | ICD-10-CM | POA: Diagnosis not present

## 2014-05-20 DIAGNOSIS — R262 Difficulty in walking, not elsewhere classified: Secondary | ICD-10-CM | POA: Diagnosis not present

## 2014-05-21 ENCOUNTER — Encounter: Payer: Self-pay | Admitting: Cardiology

## 2014-05-22 DIAGNOSIS — L814 Other melanin hyperpigmentation: Secondary | ICD-10-CM | POA: Diagnosis not present

## 2014-05-22 DIAGNOSIS — L570 Actinic keratosis: Secondary | ICD-10-CM | POA: Diagnosis not present

## 2014-05-23 ENCOUNTER — Other Ambulatory Visit: Payer: Self-pay

## 2014-05-23 DIAGNOSIS — M25661 Stiffness of right knee, not elsewhere classified: Secondary | ICD-10-CM | POA: Diagnosis not present

## 2014-05-23 DIAGNOSIS — M25561 Pain in right knee: Secondary | ICD-10-CM | POA: Diagnosis not present

## 2014-05-23 DIAGNOSIS — Z1231 Encounter for screening mammogram for malignant neoplasm of breast: Secondary | ICD-10-CM

## 2014-05-23 DIAGNOSIS — Z96651 Presence of right artificial knee joint: Secondary | ICD-10-CM | POA: Diagnosis not present

## 2014-05-23 DIAGNOSIS — R262 Difficulty in walking, not elsewhere classified: Secondary | ICD-10-CM | POA: Diagnosis not present

## 2014-06-03 DIAGNOSIS — Z96652 Presence of left artificial knee joint: Secondary | ICD-10-CM | POA: Diagnosis not present

## 2014-06-03 DIAGNOSIS — R262 Difficulty in walking, not elsewhere classified: Secondary | ICD-10-CM | POA: Diagnosis not present

## 2014-06-03 DIAGNOSIS — M25662 Stiffness of left knee, not elsewhere classified: Secondary | ICD-10-CM | POA: Diagnosis not present

## 2014-06-03 DIAGNOSIS — Z96653 Presence of artificial knee joint, bilateral: Secondary | ICD-10-CM | POA: Diagnosis not present

## 2014-06-10 DIAGNOSIS — R262 Difficulty in walking, not elsewhere classified: Secondary | ICD-10-CM | POA: Diagnosis not present

## 2014-06-10 DIAGNOSIS — M25661 Stiffness of right knee, not elsewhere classified: Secondary | ICD-10-CM | POA: Diagnosis not present

## 2014-06-10 DIAGNOSIS — Z96651 Presence of right artificial knee joint: Secondary | ICD-10-CM | POA: Diagnosis not present

## 2014-06-11 ENCOUNTER — Other Ambulatory Visit: Payer: Self-pay | Admitting: Cardiovascular Disease

## 2014-06-18 ENCOUNTER — Other Ambulatory Visit: Payer: Self-pay

## 2014-06-18 MED ORDER — RIVAROXABAN 20 MG PO TABS: 20.0000 mg | ORAL_TABLET | Freq: Every day | ORAL | Status: DC

## 2014-06-19 DIAGNOSIS — R262 Difficulty in walking, not elsewhere classified: Secondary | ICD-10-CM | POA: Diagnosis not present

## 2014-06-19 DIAGNOSIS — Z96651 Presence of right artificial knee joint: Secondary | ICD-10-CM | POA: Diagnosis not present

## 2014-06-19 DIAGNOSIS — M25661 Stiffness of right knee, not elsewhere classified: Secondary | ICD-10-CM | POA: Diagnosis not present

## 2014-07-03 ENCOUNTER — Ambulatory Visit
Admission: RE | Admit: 2014-07-03 | Discharge: 2014-07-03 | Disposition: A | Discharge status: Home / Self Care | Payer: Medicare Other | Source: Ambulatory Visit

## 2014-07-03 DIAGNOSIS — Z1231 Encounter for screening mammogram for malignant neoplasm of breast: Secondary | ICD-10-CM

## 2014-08-05 ENCOUNTER — Ambulatory Visit (INDEPENDENT_AMBULATORY_CARE_PROVIDER_SITE_OTHER): Payer: Medicare Other | Admitting: Cardiovascular Disease

## 2014-08-05 ENCOUNTER — Encounter: Payer: Self-pay | Admitting: Cardiovascular Disease

## 2014-08-05 VITALS — BP 124/58 | HR 63 | Ht 67.0 in | Wt 151.0 lb

## 2014-08-05 DIAGNOSIS — I480 Paroxysmal atrial fibrillation: Secondary | ICD-10-CM | POA: Diagnosis not present

## 2014-08-05 MED ORDER — RIVAROXABAN 20 MG PO TABS: 20.0000 mg | ORAL_TABLET | Freq: Every day | ORAL | Status: DC

## 2014-08-05 NOTE — Progress Notes (Signed)
Cardiology Office Note   Date:  08/05/2014   ID:  Monica Williamson, DOB 12/06/1940, MRN [id number]  PCP:  Marton Redwood, MD  Cardiologist:   Acie Fredrickson Wonda Cheng, MD   Chief Complaint  Patient presents with  . Follow-up    atrial fib      History of Present Illness: Monica Williamson is a 74 y.o. female who presents for follow-up of her paroxysmal atrial fibrillation.  She has a CHADS VASC 2 score of 2 and is on Xarelto .   She has had both  knees replaced.  She has had only 2 episodes of PAF - resolved very quickly with the propranolol.   Doing well.    Past Medical History  Diagnosis Date  . Hypertension   . Childhood asthma   . Diverticulosis   . Vitamin D deficiency   . Depression   . Anxiety   . Pulmonary nodule   . Nocturia   . Colon polyps   . Cholelithiasis   . Atrial fibrillation     when takes Warfarin  . Atrial fibrillation, currently in sinus rhythm   . Bronchiectasis     contolled with lovent and allergy med  . Sleep related teeth grinding     wears a mouth guard at night  . Stress fracture of tibia 2014  . Complication of anesthesia   . PONV (postoperative nausea and vomiting)     severe n/v post anesthesia   . Osteopenia   . Arthritis   . History of airborne allergies     uses inhalers  . H/O bronchiectasis   . Dysrhythmia     sees Dr Acie Fredrickson annually for h/o Afib    Past Surgical History  Procedure Laterality Date  . Vesicovaginal fistula closure w/ tah    . Incision and drainage breast abscess    . Vaginal hysterectomy    . Cholecystectomy      11/2010  . Breast surgery    . Meniscus repair Right 2014  . Total knee arthroplasty Right 09/30/2013    Procedure: RIGHT TOTAL KNEE ARTHROPLASTY;  Surgeon: Vickey Huger, MD;  Location: [facility];  Service: Orthopedics;  Laterality: Right;  . Joint replacement Right 09/2013    knee  . Total knee arthroplasty Left 02/24/2014    dr Ronnie Derby  . Total knee arthroplasty Left 02/24/2014    Procedure:  TOTAL KNEE ARTHROPLASTY;  Surgeon: Vickey Huger, MD;  Location: [facility];  Service: Orthopedics;  Laterality: Left;     Current Outpatient Prescriptions  Medication Sig Dispense Refill  . albuterol (PROVENTIL HFA;VENTOLIN HFA) 108 (90 BASE) MCG/ACT inhaler Inhale 2 puffs into the lungs 2 (two) times daily as needed for wheezing or shortness of breath.     . ALPRAZolam (XANAX) 0.5 MG tablet Take 0.25 mg by mouth at bedtime as needed for anxiety or sleep.     [name] buPROPion (WELLBUTRIN XL) 150 MG 24 hr tablet Take 150 mg by mouth every morning.  30 tablet 11  . Calcium Carbonate-Vit D-Min (CALCIUM 1200) 1200-1000 MG-UNIT CHEW Chew 1 tablet by mouth every morning.     . Cholecalciferol (VITAMIN D) 1000 UNITS capsule Take 1,000 Units by mouth 2 (two) times daily.  30 capsule 11  . diltiazem (DILACOR XR) 180 MG 24 hr capsule Take 180 mg by mouth every morning.    . fluticasone (FLOVENT HFA) 110 MCG/ACT inhaler Inhale 2 puffs into the lungs 2 (two) times daily.     [name] loratadine (  CLARITIN) 10 MG tablet Take 10 mg by mouth every morning.    [name] losartan (COZAAR) 50 MG tablet Take 50 mg by mouth every morning.    . propranolol (INDERAL) 10 MG tablet Take 1 tablet (10 mg total) by mouth 4 (four) times daily as needed (FOR PALPITATIONS Can take ONE every 30 minutes Up to 4 Doses). 30 tablet 0  . rivaroxaban (XARELTO) 20 MG TABS tablet Take 1 tablet (20 mg total) by mouth daily with supper. 30 tablet 0   No current facility-administered medications for this visit.    Allergies:   Warfarin and related and Avelox    Social History:  The patient  reports that she quit smoking about 36 years ago. She has never used smokeless tobacco. She reports that she does not use illicit drugs.   Family History:  The patient's family history includes Heart disease in her sister. There is no history of Colon cancer.    ROS:  Please see the history of present illness.   Otherwise, review of systems are positive for none.   All  other systems are reviewed and negative.    PHYSICAL EXAM: VS:  BP 124/58 mmHg  Pulse 63  Ht 5\' 7"  (1.702 m)  Wt 151 lb (68.493 kg)  BMI 23.64 kg/m2 , BMI Body mass index is 23.64 kg/(m^2). GEN: Well nourished, well developed, in no acute distress HEENT: normal Neck: no JVD, carotid bruits, or masses Cardiac: RRR; no murmurs, rubs, or gallops,no edema  Respiratory:  clear to auscultation bilaterally, normal work of breathing GI: soft, nontender, nondistended, + BS MS: no deformity or atrophy Skin: warm and dry, no rash Neuro:  Strength and sensation are intact Psych: euthymic mood, full affect   EKG:  EKG is ordered today. The ekg ordered today demonstrates NSR at 63.  Normal ECG.    Recent Labs: 02/26/2014: ALT 108*; BUN 21; Creatinine 1.05; Hemoglobin 8.0*; Platelets 242; Potassium 4.7; Sodium 130*    Lipid Panel No results found for: CHOL, TRIG, HDL, CHOLHDL, VLDL, LDLCALC, LDLDIRECT    Wt Readings from Last 3 Encounters:  08/05/14 151 lb (68.493 kg)  02/24/14 151 lb (68.493 kg)  09/30/13 154 lb 15.7 oz (70.3 kg)      Other studies Reviewed:   ASSESSMENT AND PLAN:  1.  1. Paroxysmal atrial ablation: The patient remains in normal sinus rhythm. She's tolerating the Xarelto very well.  Continue current meds.  Will see her in 1 year.    Current medicines are reviewed at length with the patient today.  The patient does not have concerns regarding medicines.  The following changes have been made:  no change  Labs/ tests ordered today include: ECG   No orders of the defined types were placed in this encounter.     Disposition:   FU with me  in 1 year.    Signed, Nahser, Wonda Cheng, MD  08/05/2014 11:49 AM    [address], [place], [place]  [address] Phone: [phone]; Fax: [phone]

## 2014-08-05 NOTE — Patient Instructions (Signed)
Your physician recommends that you continue on your current medications as directed. Please refer to the Current Medication list given to you today.  Your physician wants you to follow-up in: 1 year with Dr. Nahser.  You will receive a reminder letter in the mail two months in advance. If you don't receive a letter, please call our office to schedule the follow-up appointment.  

## 2014-08-06 DIAGNOSIS — L570 Actinic keratosis: Secondary | ICD-10-CM | POA: Diagnosis not present

## 2014-09-08 ENCOUNTER — Other Ambulatory Visit: Payer: Self-pay | Admitting: Cardiovascular Disease

## 2014-09-09 DIAGNOSIS — E559 Vitamin D deficiency, unspecified: Secondary | ICD-10-CM | POA: Diagnosis not present

## 2014-09-09 DIAGNOSIS — Z6823 Body mass index (BMI) 23.0-23.9, adult: Secondary | ICD-10-CM | POA: Diagnosis not present

## 2014-09-09 DIAGNOSIS — M859 Disorder of bone density and structure, unspecified: Secondary | ICD-10-CM | POA: Diagnosis not present

## 2014-09-09 DIAGNOSIS — M8580 Other specified disorders of bone density and structure, unspecified site: Secondary | ICD-10-CM | POA: Diagnosis not present

## 2014-09-09 DIAGNOSIS — Z79899 Other long term (current) drug therapy: Secondary | ICD-10-CM | POA: Diagnosis not present

## 2014-09-10 ENCOUNTER — Other Ambulatory Visit (HOSPITAL_COMMUNITY): Payer: Self-pay | Admitting: *Deleted

## 2014-09-12 ENCOUNTER — Ambulatory Visit (HOSPITAL_COMMUNITY)
Admission: RE | Admit: 2014-09-12 | Discharge: 2014-09-12 | Disposition: A | Discharge status: Home / Self Care | Payer: Medicare Other | Source: Ambulatory Visit | Attending: Internal Medicine | Admitting: Internal Medicine

## 2014-09-12 DIAGNOSIS — M810 Age-related osteoporosis without current pathological fracture: Secondary | ICD-10-CM | POA: Diagnosis not present

## 2014-09-12 MED ORDER — DENOSUMAB 60 MG/ML ~~LOC~~ SOLN
60.0000 mg | Freq: Once | SUBCUTANEOUS | Status: AC
  Administered 2014-09-12: 60 mg via SUBCUTANEOUS
  Filled 2014-09-12: qty 1

## 2014-11-26 DIAGNOSIS — Z0000 Encounter for general adult medical examination without abnormal findings: Secondary | ICD-10-CM | POA: Diagnosis not present

## 2014-11-26 DIAGNOSIS — I10 Essential (primary) hypertension: Secondary | ICD-10-CM | POA: Diagnosis not present

## 2014-11-26 DIAGNOSIS — E559 Vitamin D deficiency, unspecified: Secondary | ICD-10-CM | POA: Diagnosis not present

## 2014-11-27 DIAGNOSIS — Z1389 Encounter for screening for other disorder: Secondary | ICD-10-CM | POA: Diagnosis not present

## 2014-11-27 DIAGNOSIS — Z0000 Encounter for general adult medical examination without abnormal findings: Secondary | ICD-10-CM | POA: Diagnosis not present

## 2014-11-27 DIAGNOSIS — E559 Vitamin D deficiency, unspecified: Secondary | ICD-10-CM | POA: Diagnosis not present

## 2014-11-27 DIAGNOSIS — I10 Essential (primary) hypertension: Secondary | ICD-10-CM | POA: Diagnosis not present

## 2014-11-27 DIAGNOSIS — I129 Hypertensive chronic kidney disease with stage 1 through stage 4 chronic kidney disease, or unspecified chronic kidney disease: Secondary | ICD-10-CM | POA: Diagnosis not present

## 2014-11-27 DIAGNOSIS — N182 Chronic kidney disease, stage 2 (mild): Secondary | ICD-10-CM | POA: Diagnosis not present

## 2014-11-27 DIAGNOSIS — I480 Paroxysmal atrial fibrillation: Secondary | ICD-10-CM | POA: Diagnosis not present

## 2014-11-27 DIAGNOSIS — M8580 Other specified disorders of bone density and structure, unspecified site: Secondary | ICD-10-CM | POA: Diagnosis not present

## 2014-11-27 DIAGNOSIS — R0989 Other specified symptoms and signs involving the circulatory and respiratory systems: Secondary | ICD-10-CM | POA: Diagnosis not present

## 2014-11-27 DIAGNOSIS — J45909 Unspecified asthma, uncomplicated: Secondary | ICD-10-CM | POA: Diagnosis not present

## 2014-11-27 DIAGNOSIS — Z6822 Body mass index (BMI) 22.0-22.9, adult: Secondary | ICD-10-CM | POA: Diagnosis not present

## 2014-11-27 DIAGNOSIS — K219 Gastro-esophageal reflux disease without esophagitis: Secondary | ICD-10-CM | POA: Diagnosis not present

## 2014-12-02 DIAGNOSIS — Z96651 Presence of right artificial knee joint: Secondary | ICD-10-CM | POA: Diagnosis not present

## 2014-12-02 DIAGNOSIS — Z471 Aftercare following joint replacement surgery: Secondary | ICD-10-CM | POA: Diagnosis not present

## 2014-12-02 DIAGNOSIS — Z96652 Presence of left artificial knee joint: Secondary | ICD-10-CM | POA: Diagnosis not present

## 2014-12-05 DIAGNOSIS — Z1212 Encounter for screening for malignant neoplasm of rectum: Secondary | ICD-10-CM | POA: Diagnosis not present

## 2014-12-10 DIAGNOSIS — E559 Vitamin D deficiency, unspecified: Secondary | ICD-10-CM | POA: Diagnosis not present

## 2014-12-10 DIAGNOSIS — R350 Frequency of micturition: Secondary | ICD-10-CM | POA: Diagnosis not present

## 2014-12-10 DIAGNOSIS — R0989 Other specified symptoms and signs involving the circulatory and respiratory systems: Secondary | ICD-10-CM | POA: Diagnosis not present

## 2014-12-10 DIAGNOSIS — M859 Disorder of bone density and structure, unspecified: Secondary | ICD-10-CM | POA: Diagnosis not present

## 2014-12-10 DIAGNOSIS — N390 Urinary tract infection, site not specified: Secondary | ICD-10-CM | POA: Diagnosis not present

## 2014-12-16 DIAGNOSIS — M5136 Other intervertebral disc degeneration, lumbar region: Secondary | ICD-10-CM | POA: Diagnosis not present

## 2014-12-16 DIAGNOSIS — M9903 Segmental and somatic dysfunction of lumbar region: Secondary | ICD-10-CM | POA: Diagnosis not present

## 2014-12-16 DIAGNOSIS — M9901 Segmental and somatic dysfunction of cervical region: Secondary | ICD-10-CM | POA: Diagnosis not present

## 2014-12-16 DIAGNOSIS — M4003 Postural kyphosis, cervicothoracic region: Secondary | ICD-10-CM | POA: Diagnosis not present

## 2014-12-16 DIAGNOSIS — Q72812 Congenital shortening of left lower limb: Secondary | ICD-10-CM | POA: Diagnosis not present

## 2014-12-16 DIAGNOSIS — M9904 Segmental and somatic dysfunction of sacral region: Secondary | ICD-10-CM | POA: Diagnosis not present

## 2014-12-16 DIAGNOSIS — M9905 Segmental and somatic dysfunction of pelvic region: Secondary | ICD-10-CM | POA: Diagnosis not present

## 2014-12-16 DIAGNOSIS — M9902 Segmental and somatic dysfunction of thoracic region: Secondary | ICD-10-CM | POA: Diagnosis not present

## 2014-12-16 DIAGNOSIS — M5135 Other intervertebral disc degeneration, thoracolumbar region: Secondary | ICD-10-CM | POA: Diagnosis not present

## 2015-02-02 DIAGNOSIS — Z6823 Body mass index (BMI) 23.0-23.9, adult: Secondary | ICD-10-CM | POA: Diagnosis not present

## 2015-02-02 DIAGNOSIS — I6523 Occlusion and stenosis of bilateral carotid arteries: Secondary | ICD-10-CM | POA: Diagnosis not present

## 2015-02-02 DIAGNOSIS — I10 Essential (primary) hypertension: Secondary | ICD-10-CM | POA: Diagnosis not present

## 2015-03-12 ENCOUNTER — Other Ambulatory Visit (HOSPITAL_COMMUNITY): Payer: Self-pay | Admitting: *Deleted

## 2015-03-13 ENCOUNTER — Ambulatory Visit (HOSPITAL_COMMUNITY)
Admission: RE | Admit: 2015-03-13 | Discharge: 2015-03-13 | Disposition: A | Discharge status: Home / Self Care | Payer: Medicare Other | Source: Ambulatory Visit | Attending: Internal Medicine | Admitting: Internal Medicine

## 2015-03-13 DIAGNOSIS — M810 Age-related osteoporosis without current pathological fracture: Secondary | ICD-10-CM | POA: Insufficient documentation

## 2015-03-13 DIAGNOSIS — H43393 Other vitreous opacities, bilateral: Secondary | ICD-10-CM | POA: Diagnosis not present

## 2015-03-13 DIAGNOSIS — H2513 Age-related nuclear cataract, bilateral: Secondary | ICD-10-CM | POA: Diagnosis not present

## 2015-03-13 DIAGNOSIS — H4322 Crystalline deposits in vitreous body, left eye: Secondary | ICD-10-CM | POA: Diagnosis not present

## 2015-03-13 MED ORDER — DENOSUMAB 60 MG/ML ~~LOC~~ SOLN
60.0000 mg | Freq: Once | SUBCUTANEOUS | Status: AC
  Administered 2015-03-13: 60 mg via SUBCUTANEOUS
  Filled 2015-03-13: qty 1

## 2015-04-07 DIAGNOSIS — I10 Essential (primary) hypertension: Secondary | ICD-10-CM | POA: Diagnosis not present

## 2015-04-11 DIAGNOSIS — Z23 Encounter for immunization: Secondary | ICD-10-CM | POA: Diagnosis not present

## 2015-06-02 ENCOUNTER — Other Ambulatory Visit: Payer: Self-pay

## 2015-06-02 DIAGNOSIS — Z1231 Encounter for screening mammogram for malignant neoplasm of breast: Secondary | ICD-10-CM

## 2015-06-03 ENCOUNTER — Other Ambulatory Visit: Payer: Self-pay | Admitting: Cardiovascular Disease

## 2015-07-06 ENCOUNTER — Ambulatory Visit
Admission: RE | Admit: 2015-07-06 | Discharge: 2015-07-06 | Disposition: A | Discharge status: Home / Self Care | Payer: Medicare Other | Source: Ambulatory Visit

## 2015-07-06 DIAGNOSIS — Z1231 Encounter for screening mammogram for malignant neoplasm of breast: Secondary | ICD-10-CM

## 2015-07-07 ENCOUNTER — Other Ambulatory Visit: Payer: Self-pay | Admitting: Cardiovascular Disease

## 2015-08-05 ENCOUNTER — Ambulatory Visit: Payer: Medicare Other | Admitting: Cardiovascular Disease

## 2015-08-14 ENCOUNTER — Encounter: Payer: Self-pay | Admitting: Cardiovascular Disease

## 2015-08-14 ENCOUNTER — Ambulatory Visit (INDEPENDENT_AMBULATORY_CARE_PROVIDER_SITE_OTHER): Payer: Medicare Other | Admitting: Cardiovascular Disease

## 2015-08-14 VITALS — BP 110/70 | HR 61 | Ht 67.0 in | Wt 147.6 lb

## 2015-08-14 DIAGNOSIS — I4891 Unspecified atrial fibrillation: Secondary | ICD-10-CM | POA: Diagnosis not present

## 2015-08-14 DIAGNOSIS — I480 Paroxysmal atrial fibrillation: Secondary | ICD-10-CM | POA: Diagnosis not present

## 2015-08-14 MED ORDER — DILTIAZEM HCL ER 180 MG PO CP24: 180.0000 mg | ORAL_CAPSULE | Freq: Every day | ORAL | Status: DC

## 2015-08-14 NOTE — Patient Instructions (Signed)
Medication Instructions:  Your physician recommends that you continue on your current medications as directed. Please refer to the Current Medication list given to you today.   Labwork: None Ordered   Testing/Procedures: None Ordered   Follow-Up: Your physician wants you to follow-up in: 1 year with Dr. Nahser.  You will receive a reminder letter in the mail two months in advance. If you don't receive a letter, please call our office to schedule the follow-up appointment.   If you need a refill on your cardiac medications before your next appointment, please call your pharmacy.   Thank you for choosing [facility]! Rushton Early, RN [phone]    

## 2015-08-14 NOTE — Progress Notes (Signed)
Cardiology Office Note   Date:  08/14/2015   ID:  Monica Williamson, DOB [birth year], MRN [id number]  PCP:  Marton Redwood, MD  Cardiologist:   Acie Fredrickson Wonda Cheng, MD   Chief Complaint  Patient presents with  . Follow-up    atrial fib      History of Present Illness: Monica Williamson is a 75 y.o. female who presents for follow-up of her paroxysmal atrial fibrillation.  She has a CHADS VASC 2 score of 2 and is on Xarelto .   She has had both  knees replaced.  She has had only 2 episodes of PAF - resolved very quickly with the propranolol.   Doing well.   Jan. 27, 2017:     Past Medical History  Diagnosis Date  . Hypertension   . Childhood asthma   . Diverticulosis   . Vitamin D deficiency   . Depression   . Anxiety   . Pulmonary nodule   . Nocturia   . Colon polyps   . Cholelithiasis   . Atrial fibrillation ([place])     when takes Warfarin  . Atrial fibrillation, currently in sinus rhythm ([place])   . Bronchiectasis ([place])     contolled with lovent and allergy med  . Sleep related teeth grinding     wears a mouth guard at night  . Stress fracture of tibia 2014  . Complication of anesthesia   . PONV (postoperative nausea and vomiting)     severe n/v post anesthesia   . Osteopenia   . Arthritis   . History of airborne allergies     uses inhalers  . H/O bronchiectasis   . Dysrhythmia     sees Dr Acie Fredrickson annually for h/o Afib    Past Surgical History  Procedure Laterality Date  . Vesicovaginal fistula closure w/ tah    . Incision and drainage breast abscess    . Vaginal hysterectomy    . Cholecystectomy      11/2010  . Breast surgery    . Meniscus repair Right 2014  . Total knee arthroplasty Right 09/30/2013    Procedure: RIGHT TOTAL KNEE ARTHROPLASTY;  Surgeon: Vickey Huger, MD;  Location: [facility];  Service: Orthopedics;  Laterality: Right;  . Joint replacement Right 09/2013    knee  . Total knee arthroplasty Left 02/24/2014    dr Ronnie Derby  . Total knee  arthroplasty Left 02/24/2014    Procedure: TOTAL KNEE ARTHROPLASTY;  Surgeon: Vickey Huger, MD;  Location: [facility];  Service: Orthopedics;  Laterality: Left;     Current Outpatient Prescriptions  Medication Sig Dispense Refill  . albuterol (PROVENTIL HFA;VENTOLIN HFA) 108 (90 BASE) MCG/ACT inhaler Inhale 2 puffs into the lungs 2 (two) times daily as needed for wheezing or shortness of breath.     . ALPRAZolam (XANAX) 0.5 MG tablet Take 0.25 mg by mouth at bedtime as needed for anxiety or sleep.     [name] buPROPion (WELLBUTRIN XL) 150 MG 24 hr tablet Take 150 mg by mouth every morning.  30 tablet 11  . Calcium Carbonate-Vit D-Min (CALCIUM 1200) 1200-1000 MG-UNIT CHEW Chew 1 tablet by mouth every morning.     . Cholecalciferol (VITAMIN D) 1000 UNITS capsule Take 1,000 Units by mouth 2 (two) times daily.  30 capsule 11  . DILT-XR 180 MG 24 hr capsule TAKE 1 CAPSULE DAILY 90 capsule 0  . fluticasone (FLOVENT HFA) 110 MCG/ACT inhaler Inhale 2 puffs into the lungs 2 (two) times daily.     [name]  loratadine (CLARITIN) 10 MG tablet Take 10 mg by mouth every morning.    [name] losartan (COZAAR) 50 MG tablet Take 50 mg by mouth every morning.    . propranolol (INDERAL) 10 MG tablet Take 1 tablet (10 mg total) by mouth 4 (four) times daily as needed (FOR PALPITATIONS Can take ONE every 30 minutes Up to 4 Doses). 30 tablet 0  . rivaroxaban (XARELTO) 20 MG TABS tablet Take 1 tablet (20 mg total) by mouth daily with supper. 90 tablet 1  . valsartan (DIOVAN) 160 MG tablet Take 160 mg by mouth daily.     No current facility-administered medications for this visit.    Allergies:   Warfarin and related and Avelox    Social History:  The patient  reports that she quit smoking about 37 years ago. She has never used smokeless tobacco. She reports that she does not use illicit drugs.   Family History:  The patient's family history includes Heart disease in her sister. There is no history of Colon cancer.    ROS:  Please see  the history of present illness.   Otherwise, review of systems are positive for none.   All other systems are reviewed and negative.    PHYSICAL EXAM: VS:  BP 110/70 mmHg  Pulse 61  Ht 5\' 7"  (1.702 m)  Wt 147 lb 9.6 oz (66.951 kg)  BMI 23.11 kg/m2 , BMI Body mass index is 23.11 kg/(m^2). GEN: Well nourished, well developed, in no acute distress HEENT: normal Neck: no JVD, carotid bruits, or masses Cardiac: RRR; no murmurs, rubs, or gallops,no edema  Respiratory:  clear to auscultation bilaterally, normal work of breathing GI: soft, nontender, nondistended, + BS MS: no deformity or atrophy Skin: warm and dry, no rash Neuro:  Strength and sensation are intact Psych: euthymic mood, full affect   EKG:  EKG is ordered today. The ekg ordered today demonstrates NSR at 61.  Normal ECG.    Recent Labs: No results found for requested labs within last 365 days.    Lipid Panel No results found for: CHOL, TRIG, HDL, CHOLHDL, VLDL, LDLCALC, LDLDIRECT    Wt Readings from Last 3 Encounters:  08/14/15 147 lb 9.6 oz (66.951 kg)  08/05/14 151 lb (68.493 kg)  02/24/14 151 lb (68.493 kg)      Other studies Reviewed:   ASSESSMENT AND PLAN:  1.  1. Paroxysmal atrial fibrillation  The patient remains in normal sinus rhythm. She's tolerating the Xarelto very well.  Continue current meds.  Will see her in 1 year.  Will refill Dilt.   Current medicines are reviewed at length with the patient today.  The patient does not have concerns regarding medicines.  The following changes have been made:  no change  Labs/ tests ordered today include: ECG   No orders of the defined types were placed in this encounter.    Disposition:   FU with me  in 1 year.    Signed, Nahser, Wonda Cheng, MD  08/14/2015 10:01 AM    [address], [place], [place]  [address] Phone: [phone]; Fax: [phone]

## 2015-09-10 ENCOUNTER — Other Ambulatory Visit (HOSPITAL_COMMUNITY): Payer: Self-pay | Admitting: *Deleted

## 2015-09-11 ENCOUNTER — Encounter (HOSPITAL_COMMUNITY)
Admission: RE | Admit: 2015-09-11 | Discharge: 2015-09-11 | Disposition: A | Discharge status: Home / Self Care | Payer: Medicare Other | Source: Ambulatory Visit | Attending: Internal Medicine | Admitting: Internal Medicine

## 2015-09-11 DIAGNOSIS — M810 Age-related osteoporosis without current pathological fracture: Secondary | ICD-10-CM | POA: Diagnosis not present

## 2015-09-11 MED ORDER — DENOSUMAB 60 MG/ML ~~LOC~~ SOLN
60.0000 mg | Freq: Once | SUBCUTANEOUS | Status: AC
  Administered 2015-09-11: 60 mg via SUBCUTANEOUS
  Filled 2015-09-11: qty 1

## 2015-11-26 DIAGNOSIS — D6489 Other specified anemias: Secondary | ICD-10-CM | POA: Diagnosis not present

## 2015-11-26 DIAGNOSIS — E559 Vitamin D deficiency, unspecified: Secondary | ICD-10-CM | POA: Diagnosis not present

## 2015-11-26 DIAGNOSIS — I10 Essential (primary) hypertension: Secondary | ICD-10-CM | POA: Diagnosis not present

## 2015-11-26 DIAGNOSIS — I129 Hypertensive chronic kidney disease with stage 1 through stage 4 chronic kidney disease, or unspecified chronic kidney disease: Secondary | ICD-10-CM | POA: Diagnosis not present

## 2015-11-26 DIAGNOSIS — M859 Disorder of bone density and structure, unspecified: Secondary | ICD-10-CM | POA: Diagnosis not present

## 2015-11-26 DIAGNOSIS — I480 Paroxysmal atrial fibrillation: Secondary | ICD-10-CM | POA: Diagnosis not present

## 2015-12-02 DIAGNOSIS — Z7901 Long term (current) use of anticoagulants: Secondary | ICD-10-CM | POA: Diagnosis not present

## 2015-12-02 DIAGNOSIS — Z0000 Encounter for general adult medical examination without abnormal findings: Secondary | ICD-10-CM | POA: Diagnosis not present

## 2015-12-02 DIAGNOSIS — Z1389 Encounter for screening for other disorder: Secondary | ICD-10-CM | POA: Diagnosis not present

## 2015-12-02 DIAGNOSIS — I480 Paroxysmal atrial fibrillation: Secondary | ICD-10-CM | POA: Diagnosis not present

## 2015-12-02 DIAGNOSIS — F418 Other specified anxiety disorders: Secondary | ICD-10-CM | POA: Diagnosis not present

## 2015-12-02 DIAGNOSIS — J45998 Other asthma: Secondary | ICD-10-CM | POA: Diagnosis not present

## 2015-12-02 DIAGNOSIS — I10 Essential (primary) hypertension: Secondary | ICD-10-CM | POA: Diagnosis not present

## 2015-12-02 DIAGNOSIS — N182 Chronic kidney disease, stage 2 (mild): Secondary | ICD-10-CM | POA: Diagnosis not present

## 2015-12-02 DIAGNOSIS — E559 Vitamin D deficiency, unspecified: Secondary | ICD-10-CM | POA: Diagnosis not present

## 2015-12-02 DIAGNOSIS — I6523 Occlusion and stenosis of bilateral carotid arteries: Secondary | ICD-10-CM | POA: Diagnosis not present

## 2015-12-02 DIAGNOSIS — M859 Disorder of bone density and structure, unspecified: Secondary | ICD-10-CM | POA: Diagnosis not present

## 2015-12-02 DIAGNOSIS — Z6823 Body mass index (BMI) 23.0-23.9, adult: Secondary | ICD-10-CM | POA: Diagnosis not present

## 2015-12-08 DIAGNOSIS — Z1212 Encounter for screening for malignant neoplasm of rectum: Secondary | ICD-10-CM | POA: Diagnosis not present

## 2016-01-05 ENCOUNTER — Other Ambulatory Visit: Payer: Self-pay | Admitting: Cardiovascular Disease

## 2016-01-05 ENCOUNTER — Other Ambulatory Visit: Payer: Self-pay

## 2016-01-05 MED ORDER — RIVAROXABAN 20 MG PO TABS: 20.0000 mg | ORAL_TABLET | Freq: Every day | ORAL | Status: DC

## 2016-02-05 ENCOUNTER — Other Ambulatory Visit (HOSPITAL_COMMUNITY): Payer: Self-pay | Admitting: Internal Medicine

## 2016-02-05 ENCOUNTER — Ambulatory Visit (HOSPITAL_COMMUNITY)
Admission: RE | Admit: 2016-02-05 | Discharge: 2016-02-05 | Disposition: A | Discharge status: Home / Self Care | Payer: Medicare Other | Source: Ambulatory Visit | Attending: Vascular Surgery | Admitting: Vascular Surgery

## 2016-02-05 DIAGNOSIS — I6529 Occlusion and stenosis of unspecified carotid artery: Secondary | ICD-10-CM

## 2016-02-05 DIAGNOSIS — F329 Major depressive disorder, single episode, unspecified: Secondary | ICD-10-CM | POA: Diagnosis not present

## 2016-02-05 DIAGNOSIS — F419 Anxiety disorder, unspecified: Secondary | ICD-10-CM | POA: Insufficient documentation

## 2016-02-05 DIAGNOSIS — I6523 Occlusion and stenosis of bilateral carotid arteries: Secondary | ICD-10-CM | POA: Diagnosis not present

## 2016-02-05 DIAGNOSIS — I10 Essential (primary) hypertension: Secondary | ICD-10-CM | POA: Insufficient documentation

## 2016-02-05 LAB — VAS US CAROTID
LCCADDIAS: 19 cm/s
LICADDIAS: -33 cm/s
LICADSYS: -130 cm/s
LICAPDIAS: 35 cm/s
LICAPSYS: 167 cm/s
Left CCA dist sys: 95 cm/s
Left CCA prox dias: 16 cm/s
Left CCA prox sys: 90 cm/s
RCCAPSYS: 89 cm/s
RIGHT CCA MID DIAS: 15 cm/s
RIGHT ECA DIAS: 0 cm/s
Right CCA prox dias: 14 cm/s
Right cca dist sys: -123 cm/s

## 2016-02-28 ENCOUNTER — Emergency Department (HOSPITAL_COMMUNITY): Payer: Medicare Other

## 2016-02-28 ENCOUNTER — Encounter (HOSPITAL_COMMUNITY): Payer: Self-pay

## 2016-02-28 ENCOUNTER — Inpatient Hospital Stay (HOSPITAL_COMMUNITY)
Admission: EM | Admit: 2016-02-28 | Discharge: 2016-03-01 | DRG: 153 | Disposition: A | Discharge status: Home / Self Care | Payer: Medicare Other | Attending: Internal Medicine | Admitting: Internal Medicine

## 2016-02-28 DIAGNOSIS — J0510 Acute epiglottitis without obstruction: Secondary | ICD-10-CM

## 2016-02-28 DIAGNOSIS — J0430 Supraglottitis, unspecified, without obstruction: Secondary | ICD-10-CM | POA: Diagnosis not present

## 2016-02-28 DIAGNOSIS — A419 Sepsis, unspecified organism: Secondary | ICD-10-CM | POA: Diagnosis not present

## 2016-02-28 DIAGNOSIS — I480 Paroxysmal atrial fibrillation: Secondary | ICD-10-CM | POA: Diagnosis present

## 2016-02-28 DIAGNOSIS — Z79899 Other long term (current) drug therapy: Secondary | ICD-10-CM

## 2016-02-28 DIAGNOSIS — Z7901 Long term (current) use of anticoagulants: Secondary | ICD-10-CM | POA: Diagnosis not present

## 2016-02-28 DIAGNOSIS — Z87891 Personal history of nicotine dependence: Secondary | ICD-10-CM | POA: Diagnosis not present

## 2016-02-28 DIAGNOSIS — R0602 Shortness of breath: Secondary | ICD-10-CM | POA: Diagnosis not present

## 2016-02-28 DIAGNOSIS — Z96653 Presence of artificial knee joint, bilateral: Secondary | ICD-10-CM | POA: Diagnosis not present

## 2016-02-28 DIAGNOSIS — R061 Stridor: Secondary | ICD-10-CM | POA: Diagnosis not present

## 2016-02-28 DIAGNOSIS — F419 Anxiety disorder, unspecified: Secondary | ICD-10-CM | POA: Diagnosis present

## 2016-02-28 DIAGNOSIS — I10 Essential (primary) hypertension: Secondary | ICD-10-CM | POA: Diagnosis present

## 2016-02-28 DIAGNOSIS — R05 Cough: Secondary | ICD-10-CM | POA: Diagnosis not present

## 2016-02-28 DIAGNOSIS — J029 Acute pharyngitis, unspecified: Secondary | ICD-10-CM | POA: Diagnosis present

## 2016-02-28 DIAGNOSIS — Z90710 Acquired absence of both cervix and uterus: Secondary | ICD-10-CM | POA: Diagnosis not present

## 2016-02-28 DIAGNOSIS — R1310 Dysphagia, unspecified: Secondary | ICD-10-CM | POA: Diagnosis not present

## 2016-02-28 DIAGNOSIS — R509 Fever, unspecified: Secondary | ICD-10-CM | POA: Diagnosis not present

## 2016-02-28 LAB — COMPREHENSIVE METABOLIC PANEL
ALBUMIN: 4.5 g/dL (ref 3.5–5.0)
ALK PHOS: 65 U/L (ref 38–126)
ALT: 61 U/L — AB (ref 14–54)
ANION GAP: 10 (ref 5–15)
AST: 74 U/L — AB (ref 15–41)
BUN: 11 mg/dL (ref 6–20)
CALCIUM: 9.4 mg/dL (ref 8.9–10.3)
CO2: 25 mmol/L (ref 22–32)
CREATININE: 0.97 mg/dL (ref 0.44–1.00)
Chloride: 99 mmol/L — ABNORMAL LOW (ref 101–111)
GFR calc Af Amer: >60 mL/min (ref >60)
GFR calc non Af Amer: 55 mL/min — ABNORMAL LOW (ref >60)
GLUCOSE: 136 mg/dL — AB (ref 65–99)
Potassium: 3.7 mmol/L (ref 3.5–5.1)
SODIUM: 134 mmol/L — AB (ref 135–145)
Total Bilirubin: 0.6 mg/dL (ref 0.3–1.2)
Total Protein: 7.9 g/dL (ref 6.5–8.1)

## 2016-02-28 LAB — I-STAT CHEM 8, ED
BUN: 15 mg/dL (ref 6–20)
CHLORIDE: 98 mmol/L — AB (ref 101–111)
CREATININE: 0.9 mg/dL (ref 0.44–1.00)
Calcium, Ion: 1.11 mmol/L — ABNORMAL LOW (ref 1.12–1.23)
GLUCOSE: 133 mg/dL — AB (ref 65–99)
HCT: 44 % (ref 36.0–46.0)
Hemoglobin: 15 g/dL (ref 12.0–15.0)
POTASSIUM: 3.7 mmol/L (ref 3.5–5.1)
Sodium: 136 mmol/L (ref 135–145)
TCO2: 25 mmol/L (ref 0–100)

## 2016-02-28 LAB — CBC WITH DIFFERENTIAL/PLATELET
BASOS ABS: 0 10*3/uL (ref 0.0–0.1)
Basophils Relative: 0 %
Eosinophils Absolute: 0 10*3/uL (ref 0.0–0.7)
Eosinophils Relative: 0 %
HEMATOCRIT: 41.9 % (ref 36.0–46.0)
Hemoglobin: 13.6 g/dL (ref 12.0–15.0)
LYMPHS ABS: 0.3 10*3/uL — AB (ref 0.7–4.0)
LYMPHS PCT: 4 %
MCH: 31.8 pg (ref 26.0–34.0)
MCHC: 32.5 g/dL (ref 30.0–36.0)
MCV: 97.9 fL (ref 78.0–100.0)
MONO ABS: 0.7 10*3/uL (ref 0.1–1.0)
Monocytes Relative: 9 %
NEUTROS ABS: 7.4 10*3/uL (ref 1.7–7.7)
Neutrophils Relative %: 87 %
Platelets: 176 10*3/uL (ref 150–400)
RBC: 4.28 MIL/uL (ref 3.87–5.11)
RDW: 14.6 % (ref 11.5–15.5)
WBC: 8.5 10*3/uL (ref 4.0–10.5)

## 2016-02-28 LAB — EXPECTORATED SPUTUM ASSESSMENT W REFEX TO RESP CULTURE

## 2016-02-28 LAB — I-STAT BETA HCG BLOOD, ED (MC, WL, AP ONLY): I-stat hCG, quantitative: <5 m[IU]/mL (ref <5)

## 2016-02-28 LAB — URINE MICROSCOPIC-ADD ON: BACTERIA UA: NONE SEEN

## 2016-02-28 LAB — APTT: APTT: 40 s — AB (ref 24–36)

## 2016-02-28 LAB — GLUCOSE, CAPILLARY: Glucose-Capillary: 148 mg/dL — ABNORMAL HIGH (ref 65–99)

## 2016-02-28 LAB — URINALYSIS, ROUTINE W REFLEX MICROSCOPIC
Bilirubin Urine: NEGATIVE
GLUCOSE, UA: NEGATIVE mg/dL
KETONES UR: 15 mg/dL — AB
LEUKOCYTES UA: NEGATIVE
Nitrite: NEGATIVE
PH: 5 (ref 5.0–8.0)
Protein, ur: 30 mg/dL — AB
Specific Gravity, Urine: 1.022 (ref 1.005–1.030)

## 2016-02-28 LAB — PROTIME-INR
INR: 1.07
Prothrombin Time: 13.9 seconds (ref 11.4–15.2)

## 2016-02-28 LAB — CORTISOL: CORTISOL PLASMA: 12.8 ug/dL

## 2016-02-28 LAB — I-STAT CG4 LACTIC ACID, ED
LACTIC ACID, VENOUS: 1.12 mmol/L (ref 0.5–1.9)
LACTIC ACID, VENOUS: 1.63 mmol/L (ref 0.5–1.9)

## 2016-02-28 LAB — PROCALCITONIN: Procalcitonin: 0.11 ng/mL

## 2016-02-28 LAB — MAGNESIUM: MAGNESIUM: 1.9 mg/dL (ref 1.7–2.4)

## 2016-02-28 LAB — PHOSPHORUS: Phosphorus: 2.4 mg/dL — ABNORMAL LOW (ref 2.5–4.6)

## 2016-02-28 LAB — MRSA PCR SCREENING: MRSA BY PCR: NEGATIVE

## 2016-02-28 MED ORDER — RACEPINEPHRINE HCL 2.25 % IN NEBU
0.5000 mL | INHALATION_SOLUTION | Freq: Once | RESPIRATORY_TRACT | Status: AC
  Administered 2016-02-28: 0.5 mL via RESPIRATORY_TRACT
  Filled 2016-02-28: qty 0.5

## 2016-02-28 MED ORDER — VANCOMYCIN HCL 500 MG IV SOLR
500.0000 mg | Freq: Two times a day (BID) | INTRAVENOUS | Status: DC
  Administered 2016-02-28 – 2016-02-29 (×2): 500 mg via INTRAVENOUS
  Filled 2016-02-28 (×3): qty 500

## 2016-02-28 MED ORDER — ALPRAZOLAM 0.5 MG PO TABS
0.5000 mg | ORAL_TABLET | Freq: Every evening | ORAL | Status: DC | PRN
  Administered 2016-02-29: 0.5 mg via ORAL
  Filled 2016-02-28: qty 1

## 2016-02-28 MED ORDER — ACETAMINOPHEN 650 MG RE SUPP
650.0000 mg | Freq: Once | RECTAL | Status: AC
  Administered 2016-02-28: 650 mg via RECTAL
  Filled 2016-02-28: qty 1

## 2016-02-28 MED ORDER — DEXTROSE 5 % IV SOLN
2.0000 g | Freq: Once | INTRAVENOUS | Status: AC
  Administered 2016-02-28: 2 g via INTRAVENOUS
  Filled 2016-02-28: qty 2

## 2016-02-28 MED ORDER — SODIUM CHLORIDE 0.9 % IV SOLN
INTRAVENOUS | Status: DC
  Administered 2016-02-28: 17:00:00 via INTRAVENOUS
  Administered 2016-02-29: 75 mL/h via INTRAVENOUS

## 2016-02-28 MED ORDER — IOPAMIDOL (ISOVUE-300) INJECTION 61%
INTRAVENOUS | Status: AC
  Administered 2016-02-28: 75 mL
  Filled 2016-02-28: qty 75

## 2016-02-28 MED ORDER — SODIUM CHLORIDE 0.9 % IV BOLUS (SEPSIS)
30.0000 mL/kg | Freq: Once | INTRAVENOUS | Status: AC
  Administered 2016-02-28: 2040 mL via INTRAVENOUS

## 2016-02-28 MED ORDER — PIPERACILLIN-TAZOBACTAM 3.375 G IVPB
3.3750 g | Freq: Three times a day (TID) | INTRAVENOUS | Status: DC
  Administered 2016-02-29: 3.375 g via INTRAVENOUS
  Filled 2016-02-28 (×2): qty 50

## 2016-02-28 MED ORDER — DEXAMETHASONE SODIUM PHOSPHATE 4 MG/ML IJ SOLN
4.0000 mg | Freq: Four times a day (QID) | INTRAMUSCULAR | Status: DC
  Administered 2016-02-28 – 2016-02-29 (×4): 4 mg via INTRAVENOUS
  Filled 2016-02-28 (×5): qty 1

## 2016-02-28 MED ORDER — RACEPINEPHRINE HCL 2.25 % IN NEBU
0.5000 mL | INHALATION_SOLUTION | Freq: Once | RESPIRATORY_TRACT | Status: DC
  Filled 2016-02-28: qty 0.5

## 2016-02-28 MED ORDER — VANCOMYCIN HCL IN DEXTROSE 1-5 GM/200ML-% IV SOLN
1000.0000 mg | Freq: Once | INTRAVENOUS | Status: AC
  Administered 2016-02-28: 1000 mg via INTRAVENOUS
  Filled 2016-02-28: qty 200

## 2016-02-28 MED ORDER — HEPARIN SODIUM (PORCINE) 5000 UNIT/ML IJ SOLN
5000.0000 [IU] | Freq: Three times a day (TID) | INTRAMUSCULAR | Status: DC
  Administered 2016-02-28 – 2016-02-29 (×3): 5000 [IU] via SUBCUTANEOUS
  Filled 2016-02-28 (×4): qty 1

## 2016-02-28 MED ORDER — DEXAMETHASONE SODIUM PHOSPHATE 10 MG/ML IJ SOLN
10.0000 mg | Freq: Once | INTRAMUSCULAR | Status: AC
  Administered 2016-02-28: 10 mg via INTRAVENOUS
  Filled 2016-02-28: qty 1

## 2016-02-28 MED ORDER — FAMOTIDINE IN NACL 20-0.9 MG/50ML-% IV SOLN
20.0000 mg | Freq: Two times a day (BID) | INTRAVENOUS | Status: DC
  Administered 2016-02-28 – 2016-02-29 (×3): 20 mg via INTRAVENOUS
  Filled 2016-02-28 (×4): qty 50

## 2016-02-28 MED ORDER — ACETAMINOPHEN 325 MG PO TABS
650.0000 mg | ORAL_TABLET | ORAL | Status: DC | PRN
  Administered 2016-02-28 – 2016-03-01 (×4): 650 mg via ORAL
  Filled 2016-02-28 (×4): qty 2

## 2016-02-28 MED ORDER — IPRATROPIUM-ALBUTEROL 0.5-2.5 (3) MG/3ML IN SOLN
3.0000 mL | Freq: Four times a day (QID) | RESPIRATORY_TRACT | Status: DC
  Administered 2016-02-28 – 2016-03-01 (×8): 3 mL via RESPIRATORY_TRACT
  Filled 2016-02-28 (×7): qty 3

## 2016-02-28 NOTE — Consult Note (Signed)
Reason for Consult stridor Referring Physician: er  Monica Williamson is an 75 y.o. female.  HPI: History of a sore throat starting Friday. She just got back from a cruise. She developed a sore throat on Friday. On Saturday the sore throat began to become worse and worse and she developed some noise to her breathing. She came in today with increasing stridor and slightly improved sore throat. She has not had a significant amount of nasal obstruction but she has had a lot of mucus in her throat. She is still able to swallow her saliva but swallowing is slightly difficult. She has not had this problem previously. She came to the emergency room with stridor that was treated with steroids and racemic epi and now she only has a intermittent stridor. CT scan showed a narrowed airway right at the glottis but otherwise the remaining trachea subglottis and supraglottis was without significant swelling or changes.  Past Medical History:  Diagnosis Date  . Anxiety   . Arthritis   . Atrial fibrillation ([place])    when takes Warfarin  . Atrial fibrillation, currently in sinus rhythm ([place])   . Bronchiectasis ([place])    contolled with lovent and allergy med  . Childhood asthma   . Cholelithiasis   . Colon polyps   . Complication of anesthesia   . Depression   . Diverticulosis   . Dysrhythmia    sees Dr Acie Fredrickson annually for h/o Afib  . H/O bronchiectasis   . History of airborne allergies    uses inhalers  . Hypertension   . Nocturia   . Osteopenia   . PONV (postoperative nausea and vomiting)    severe n/v post anesthesia   . Pulmonary nodule   . Sleep related teeth grinding    wears a mouth guard at night  . Stress fracture of tibia 2014  . Vitamin D deficiency     Past Surgical History:  Procedure Laterality Date  . BREAST SURGERY    . CHOLECYSTECTOMY     11/2010  . INCISION AND DRAINAGE BREAST ABSCESS    . JOINT REPLACEMENT Right 09/2013   knee  . MENISCUS REPAIR Right 2014  . TOTAL KNEE  ARTHROPLASTY Right 09/30/2013   Procedure: RIGHT TOTAL KNEE ARTHROPLASTY;  Surgeon: Vickey Huger, MD;  Location: [facility];  Service: Orthopedics;  Laterality: Right;  . 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 TOTAL KNEE ARTHROPLASTY Left 02/24/2014   Procedure: TOTAL KNEE ARTHROPLASTY;  Surgeon: Vickey Huger, MD;  Location: [facility];  Service: Orthopedics;  Laterality: Left;  [name] VAGINAL HYSTERECTOMY    . VESICOVAGINAL FISTULA CLOSURE W/ TAH      Family History  Problem Relation Age of Onset  . Heart disease Sister   . Colon cancer Neg Hx     Social History:  reports that she quit smoking about 37 years ago. She has a 30.00 pack-year smoking history. She has never used smokeless tobacco. She reports that she does not use drugs. Her alcohol history is not on file.  Allergies:  Allergies  Allergen Reactions  . Warfarin And Related Hives and Itching  . Avelox [Moxifloxacin] Diarrhea    Medications: I have reviewed the patient's current medications.  Results for orders placed or performed during the hospital encounter of 02/28/16 (from the past 48 hour(s))  Comprehensive metabolic panel     Status: Abnormal   Collection Time: 02/28/16 10:44 AM  Result Value Ref Range   Sodium 134 (L)  135 - 145 mmol/L   Potassium 3.7 3.5 - 5.1 mmol/L   Chloride 99 (L) 101 - 111 mmol/L   CO2 25 22 - 32 mmol/L   Glucose, Bld 136 (H) 65 - 99 mg/dL   BUN 11 6 - 20 mg/dL   Creatinine, Ser 0.97 0.44 - 1.00 mg/dL   Calcium 9.4 8.9 - 10.3 mg/dL   Total Protein 7.9 6.5 - 8.1 g/dL   Albumin 4.5 3.5 - 5.0 g/dL   AST 74 (H) 15 - 41 U/L   ALT 61 (H) 14 - 54 U/L   Alkaline Phosphatase 65 38 - 126 U/L   Total Bilirubin 0.6 0.3 - 1.2 mg/dL   GFR calc non Af Amer 55 (L) >60 mL/min   GFR calc Af Amer >60 >60 mL/min    Comment: (NOTE) The eGFR has been calculated using the CKD EPI equation. This calculation has not been validated in all clinical situations. eGFR's persistently <60 mL/min signify possible  Chronic Kidney Disease.    Anion gap 10 5 - 15  CBC with Differential     Status: Abnormal   Collection Time: 02/28/16 10:44 AM  Result Value Ref Range   WBC 8.5 4.0 - 10.5 K/uL   RBC 4.28 3.87 - 5.11 MIL/uL   Hemoglobin 13.6 12.0 - 15.0 g/dL   HCT 41.9 36.0 - 46.0 %   MCV 97.9 78.0 - 100.0 fL   MCH 31.8 26.0 - 34.0 pg   MCHC 32.5 30.0 - 36.0 g/dL   RDW 14.6 11.5 - 15.5 %   Platelets 176 150 - 400 K/uL   Neutrophils Relative % 87 %   Neutro Abs 7.4 1.7 - 7.7 K/uL   Lymphocytes Relative 4 %   Lymphs Abs 0.3 (L) 0.7 - 4.0 K/uL   Monocytes Relative 9 %   Monocytes Absolute 0.7 0.1 - 1.0 K/uL   Eosinophils Relative 0 %   Eosinophils Absolute 0.0 0.0 - 0.7 K/uL   Basophils Relative 0 %   Basophils Absolute 0.0 0.0 - 0.1 K/uL  I-Stat beta hCG blood, ED     Status: None   Collection Time: 02/28/16 10:58 AM  Result Value Ref Range   I-stat hCG, quantitative <5.0 <5 mIU/mL   Comment 3            Comment:   GEST. AGE      CONC.  (mIU/mL)   <=1 WEEK        5 - 50     2 WEEKS       50 - 500     3 WEEKS       100 - 10,000     4 WEEKS     1,000 - 30,000        FEMALE AND NON-PREGNANT FEMALE:     LESS THAN 5 mIU/mL   I-Stat CG4 Lactic Acid, ED     Status: None   Collection Time: 02/28/16 11:01 AM  Result Value Ref Range   Lactic Acid, Venous 1.12 0.5 - 1.9 mmol/L  I-stat Chem 8, ED     Status: Abnormal   Collection Time: 02/28/16 11:01 AM  Result Value Ref Range   Sodium 136 135 - 145 mmol/L   Potassium 3.7 3.5 - 5.1 mmol/L   Chloride 98 (L) 101 - 111 mmol/L   BUN 15 6 - 20 mg/dL   Creatinine, Ser 0.90 0.44 - 1.00 mg/dL   Glucose, Bld 133 (H) 65 - 99 mg/dL  Calcium, Ion 1.11 (L) 1.12 - 1.23 mmol/L   TCO2 25 0 - 100 mmol/L   Hemoglobin 15.0 12.0 - 15.0 g/dL   HCT 44.0 36.0 - 46.0 %  Urinalysis, Routine w reflex microscopic     Status: Abnormal   Collection Time: 02/28/16 12:26 PM  Result Value Ref Range   Color, Urine YELLOW YELLOW   APPearance CLEAR CLEAR   Specific  Gravity, Urine 1.022 1.005 - 1.030   pH 5.0 5.0 - 8.0   Glucose, UA NEGATIVE NEGATIVE mg/dL   Hgb urine dipstick MODERATE (A) NEGATIVE   Bilirubin Urine NEGATIVE NEGATIVE   Ketones, ur 15 (A) NEGATIVE mg/dL   Protein, ur 30 (A) NEGATIVE mg/dL   Nitrite NEGATIVE NEGATIVE   Leukocytes, UA NEGATIVE NEGATIVE  Urine microscopic-add on     Status: Abnormal   Collection Time: 02/28/16 12:26 PM  Result Value Ref Range   Squamous Epithelial / LPF 0-5 (A) NONE SEEN   WBC, UA 0-5 0 - 5 WBC/hpf   RBC / HPF 0-5 0 - 5 RBC/hpf   Bacteria, UA NONE SEEN NONE SEEN    Ct Soft Tissue Neck W Contrast  Result Date: 02/28/2016 CLINICAL DATA:  Fever and stridor EXAM: CT NECK WITH CONTRAST TECHNIQUE: Multidetector CT imaging of the neck was performed using the standard protocol following the bolus administration of intravenous contrast. CONTRAST:  41m ISOVUE-300 IOPAMIDOL (ISOVUE-300) INJECTION 61% COMPARISON:  None. FINDINGS: Pharynx and larynx: Diffuse laryngeal edema bilaterally. There is edema in the aryepiglottic folds and larynx. The vocal cords are obscured by edema. The airway appears very small through the vocal cords due to edema. No fluid collection or mass. Tonsils are normal.  Tongue is normal.  Epiglottis normal. Salivary glands: Parotid and submandibular glands normal bilaterally. Thyroid: Negative Lymph nodes: No enlarged lymph nodes in the neck. Vascular: Carotid artery atherosclerotic disease bilaterally. Carotid artery and jugular vein patent bilaterally. Limited intracranial: Negative Visualized orbits: Negative Mastoids and visualized paranasal sinuses: Mucosal edema and air-fluid level right maxillary sinus. Mucosal edema in the ethmoid sinuses bilaterally. Mastoid sinus clear bilaterally. Skeleton: Mild cervical spine degenerative change. No fracture or skeletal mass lesion. Upper chest: Lung apices clear with mild scarring in the apices. IMPRESSION: Diffuse laryngeal edema causing marked airway  narrowing. No mass or abscess. Otherwise no acute abnormality.  Normal epiglottis These results were called by telephone at the time of interpretation on 02/28/2016 at 1:31 pm to Dr. EQuintella Reichert, who verbally acknowledged these results. Electronically Signed   By: CFranchot GalloM.D.   On: 02/28/2016 13:32   Dg Chest Port 1 View  Result Date: 02/28/2016 CLINICAL DATA:  Productive cough, fever, and shortness of breath for 2 days. EXAM: PORTABLE CHEST 1 VIEW COMPARISON:  09/26/2013 FINDINGS: The heart size and mediastinal contours are within normal limits. Pulmonary hyperinflation again seen. No evidence of pulmonary infiltrate or edema. No evidence of pneumothorax or pleural effusion. IMPRESSION: Stable pulmonary hyperinflation.  No active disease. Electronically Signed   By: JEarle GellM.D.   On: 02/28/2016 11:52    ROS Blood pressure 144/62, pulse 80, temperature 99.7 F (37.6 C), temperature source Oral, resp. rate 15, height '5\' 7"'  (1.702 m), weight 68 kg (150 lb), SpO2 98 %. Physical Exam  Constitutional: She appears well-developed and well-nourished.  HENT:  Her nose is crusting in the anterior aspect. Her oral cavity/oropharynx has no lesions. Fiberoptic exam was performed past the scope through the left nose and there is some purulent  material coming down the nasopharynx. The base of tongue and epiglottis looked normal. The piriforms are obscured by swelling of the arytenoids. There is no significant erythema of this and it does not have a watery edema like angioedema. The vocal cords are visualized and are slightly thickened and appeared to move. Her voice sounds normal. She had no stridor during the procedure.  Eyes: Conjunctivae are normal. Pupils are equal, round, and reactive to light.  Neck: Normal range of motion. Neck supple.    Assessment/Plan: Supraglottitis-her fiberoptic exam reveals some swelling of her arytenoids and slight amount of her area epiglottic fold and false cords  but her glottis only has a slight thickening of the vocal cords. Her voice is almost normal. She did not have any stridor while I was in the room. I think she should be admitted to the intensive care unit on the [facility] service and observed with intravenous antibiotics, steroids, and perhaps another racemic epi treatment. It's unclear what the etiology is for this but some type of infection especially given her fever and purulent material coming from the nasopharynx. I will follow-up. Call if any further breathing issues.  Melissa Montane 02/28/2016, 2:02 PM

## 2016-02-28 NOTE — Progress Notes (Signed)
Pharmacy Antibiotic Note  Monica Williamson is a 75 y.o. female admitted on 02/28/2016 with epiglottitis. Pharmacy has been consulted for vancomycin and zosyn dosing.  Patient received vancomycin 1000mg  IVx 1 and cefepime 2gIVx 1 in the ED. Currently, her WBC is wnl and has been afebrile. Her SCr is 0.90 mg/dL. Will plan to start patient's vancomycin now to complete her load in the ED.  Plan: Vancomycin 500 IV every 12 hours.  Goal trough 15-20 mcg/mL. Zosyn 3.375g IV q8h (4 hour infusion).  Monitor renal function, C/S, and clinical progress  Height: 5\' 7"  (170.2 cm) Weight: 150 lb (68 kg) IBW/kg (Calculated) : 61.6  Temp (24hrs), Avg:100.5 F (38.1 C), Min:98.7 F (37.1 C), Max:103 F (39.4 C)   Recent Labs Lab 02/28/16 1044 02/28/16 1101 02/28/16 1445  WBC 8.5  --   --   CREATININE 0.97 0.90  --   LATICACIDVEN  --  1.12 1.63    Estimated Creatinine Clearance: 50.9 mL/min (by C-G formula based on SCr of 0.9 mg/dL).    Allergies  Allergen Reactions  . Warfarin And Related Hives and Itching  . Avelox [Moxifloxacin] Diarrhea    Antimicrobials this admission: Cefepime 8/13 x1 Vancomycin 8/13 >>  Zosyn 8/13 >>  Dose adjustments this admission: N/a  Microbiology results: 8/13 BCx: sent 8/13 UCx: sent  8/13 MRSA PCR: in process   Thank you for allowing pharmacy to be a part of this patient's care.  Demetrius Charity, PharmD Acute Care Pharmacy Resident  Pager: [phone] 02/28/2016

## 2016-02-28 NOTE — ED Notes (Signed)
Report called and accepted

## 2016-02-28 NOTE — ED Provider Notes (Signed)
[place] DEPT Provider Note   CSN: [id number] Arrival date & time: 02/28/16  1020  First Provider Contact:  None       History   Chief Complaint Chief Complaint  Patient presents with  . Shortness of Breath  . Fever  . Cough    HPI Monica Williamson is a 75 y.o. female.  The history is provided by the patient and the spouse. No language interpreter was used.  Shortness of Breath  Associated symptoms include a fever and cough.  Fever   Associated symptoms include cough.  Cough  Associated symptoms include shortness of breath.    Monica Williamson is a 75 y.o. female who presents to the Emergency Department complaining of fever, sore throat.  Level V caveat due to distress.  History is provided by the patient and her husband. She has been feeling sick for the last 2 days with sore throat and high fevers. She has difficulty swallowing and difficulty breathing. She has a nonproductive cough and feels that she cannot clear her throat well. She has been traveling on a cruise ship and just flew back from [place]. She has a history of atrial fibrillation and takes Xarelto. No prior similar symptoms.  Past Medical History:  Diagnosis Date  . Anxiety   . Arthritis   . Atrial fibrillation ([place])    when takes Warfarin  . Atrial fibrillation, currently in sinus rhythm ([place])   . Bronchiectasis ([place])    contolled with lovent and allergy med  . Childhood asthma   . Cholelithiasis   . Colon polyps   . Complication of anesthesia   . Depression   . Diverticulosis   . Dysrhythmia    sees Dr Acie Fredrickson annually for h/o Afib  . H/O bronchiectasis   . History of airborne allergies    uses inhalers  . Hypertension   . Nocturia   . Osteopenia   . PONV (postoperative nausea and vomiting)    severe n/v post anesthesia   . Pulmonary nodule   . Sleep related teeth grinding    wears a mouth guard at night  . Stress fracture of tibia 2014  . Vitamin D deficiency     Patient  Active Problem List   Diagnosis Date Noted  . Stridor 02/28/2016  . Sepsis ([place]) 02/28/2016  . Epiglottitis 02/28/2016  . 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 Long term (current) use of anticoagulants 10/01/2012  . Atrial fibrillation ([place]) 09/25/2012  . Chest pain 10/26/2010  . 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  . BRONCHIECTASIS 08/13/2007  . ALLERGIC RHINITIS 05/12/2007  . ASTHMA, CHILDHOOD 05/12/2007  . ABSCESS, BREAST 05/12/2007  . COUGH 05/12/2007  . HYSTERECTOMY, VAGINAL, HX OF 05/12/2007    Past Surgical History:  Procedure Laterality Date  . BREAST SURGERY    . CHOLECYSTECTOMY     11/2010  . INCISION AND DRAINAGE BREAST ABSCESS    . JOINT REPLACEMENT Right 09/2013   knee  . MENISCUS REPAIR Right 2014  . 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   Procedure: RIGHT TOTAL KNEE ARTHROPLASTY;  Surgeon: Vickey Huger, MD;  Location: [facility];  Service: Orthopedics;  Laterality: Right;  . 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 TOTAL KNEE ARTHROPLASTY Left 02/24/2014   Procedure: TOTAL KNEE ARTHROPLASTY;  Surgeon: Vickey Huger, MD;  Location: [facility];  Service: Orthopedics;  Laterality: Left;  [name] VAGINAL HYSTERECTOMY    . VESICOVAGINAL FISTULA CLOSURE W/ TAH      OB History  No data available       Home Medications    Prior to Admission medications   Medication Sig Start Date End Date Taking? Authorizing Provider  acetaminophen (TYLENOL) 500 MG tablet Take 1,000 mg by mouth every 6 (six) hours as needed for moderate pain or headache.   Yes Historical Provider, MD  albuterol (PROVENTIL HFA;VENTOLIN HFA) 108 (90 BASE) MCG/ACT inhaler Inhale 2 puffs into the lungs 2 (two) times daily as needed for wheezing or shortness of breath.    Yes Historical Provider, MD  ALPRAZolam Duanne Moron) 0.5 MG tablet Take 0.25 mg by mouth at bedtime as needed for anxiety or sleep.    Yes Historical Provider, MD  buPROPion (WELLBUTRIN XL) 150 MG 24 hr tablet Take 150 mg by mouth every  morning.  10/26/10  Yes Thayer Headings, MD  Calcium Carbonate-Vit D-Min (CALCIUM 1200) 1200-1000 MG-UNIT CHEW Chew 1 tablet by mouth every morning.    Yes Historical Provider, MD  Cholecalciferol (VITAMIN D) 1000 UNITS capsule Take 1,000 Units by mouth 2 (two) times daily.  10/26/10  Yes Thayer Headings, MD  diltiazem (DILT-XR) 180 MG 24 hr capsule Take 1 capsule (180 mg total) by mouth daily. 08/14/15  Yes Thayer Headings, MD  fluticasone (FLOVENT HFA) 110 MCG/ACT inhaler Inhale 2 puffs into the lungs 2 (two) times daily as needed (for shortness of breath).    Yes Historical Provider, MD  propranolol (INDERAL) 10 MG tablet Take 1 tablet (10 mg total) by mouth 4 (four) times daily as needed (FOR PALPITATIONS Can take ONE every 30 minutes Up to 4 Doses). 12/29/13  Yes Scott Joylene Draft, PA-C  rivaroxaban (XARELTO) 20 MG TABS tablet Take 1 tablet (20 mg total) by mouth daily with supper. 01/05/16  Yes Thayer Headings, MD  rosuvastatin (CRESTOR) 20 MG tablet Take 20 mg by mouth at bedtime. 12/23/15  Yes Historical Provider, MD  valsartan (DIOVAN) 160 MG tablet Take 160 mg by mouth daily. 07/05/15  Yes Historical Provider, MD    Family History Family History  Problem Relation Age of Onset  . Heart disease Sister   . Colon cancer Neg Hx     Social History Social History  Substance Use Topics  . Smoking status: Former Smoker    Packs/day: 1.50    Years: 20.00    Quit date: 07/18/1978  . Smokeless tobacco: Never Used  . Alcohol use Not on file     Comment: daily cocktail     Allergies   Warfarin and related and Avelox [moxifloxacin]   Review of Systems Review of Systems  Constitutional: Positive for fever.  Respiratory: Positive for cough and shortness of breath.   All other systems reviewed and are negative.    Physical Exam Updated Vital Signs BP 143/61   Pulse 87   Temp 98.7 F (37.1 C) (Oral)   Resp 19   Ht 5\' 7"  (1.702 m)   Wt 150 lb (68 kg)   SpO2 96%   BMI 23.49 kg/m    Physical Exam  Constitutional: She is oriented to person, place, and time. She appears well-developed and well-nourished. She appears distressed.  Ill-appearing  HENT:  Head: Normocephalic and atraumatic.  Dry mucous membranes. Mild erythema of the posterior oropharynx without any overt edema.  Neck:  Mild cervical anterior lymphadenopathy. Inspiratory stridor.  Cardiovascular: Normal rate and regular rhythm.   No murmur heard. Pulmonary/Chest: Effort normal. No respiratory distress.  Rhonchi bilaterally  Abdominal: Soft. There is no tenderness.  There is no rebound and no guarding.  Musculoskeletal: She exhibits no edema or tenderness.  Neurological: She is alert and oriented to person, place, and time.  Skin: Skin is warm and dry.  Psychiatric: She has a normal mood and affect. Her behavior is normal.  Nursing note and vitals reviewed.    ED Treatments / Results  Labs (all labs ordered are listed, but only abnormal results are displayed) Labs Reviewed  COMPREHENSIVE METABOLIC PANEL - Abnormal; Notable for the following:       Result Value   Sodium 134 (*)    Chloride 99 (*)    Glucose, Bld 136 (*)    AST 74 (*)    ALT 61 (*)    GFR calc non Af Amer 55 (*)    All other components within normal limits  CBC WITH DIFFERENTIAL/PLATELET - Abnormal; Notable for the following:    Lymphs Abs 0.3 (*)    All other components within normal limits  URINALYSIS, ROUTINE W REFLEX MICROSCOPIC (NOT AT [facility]) - Abnormal; Notable for the following:    Hgb urine dipstick MODERATE (*)    Ketones, ur 15 (*)    Protein, ur 30 (*)    All other components within normal limits  URINE MICROSCOPIC-ADD ON - Abnormal; Notable for the following:    Squamous Epithelial / LPF 0-5 (*)    All other components within normal limits  I-STAT CHEM 8, ED - Abnormal; Notable for the following:    Chloride 98 (*)    Glucose, Bld 133 (*)    Calcium, Ion 1.11 (*)    All other components within normal limits   CULTURE, BLOOD (ROUTINE X 2)  CULTURE, BLOOD (ROUTINE X 2)  URINE CULTURE  MRSA PCR SCREENING  MAGNESIUM  PHOSPHORUS  PROCALCITONIN  CORTISOL  PROTIME-INR  APTT  I-STAT BETA HCG BLOOD, ED ([facility], [facility], [facility] ONLY)  I-STAT CG4 LACTIC ACID, ED  I-STAT CG4 LACTIC ACID, ED    EKG  EKG Interpretation  Date/Time:  Sunday February 28 2016 10:25:25 EDT Ventricular Rate:  94 PR Interval:  118 QRS Duration: 124 QT Interval:  370 QTC Calculation: 462 R Axis:   87 Text Interpretation:  Normal sinus rhythm Right bundle branch block Abnormal ECG Confirmed by Hazle Coca [phone]) on 02/28/2016 10:33:47 AM       Radiology Ct Soft Tissue Neck W Contrast  Result Date: 02/28/2016 CLINICAL DATA:  Fever and stridor EXAM: CT NECK WITH CONTRAST TECHNIQUE: Multidetector CT imaging of the neck was performed using the standard protocol following the bolus administration of intravenous contrast. CONTRAST:  40mL ISOVUE-300 IOPAMIDOL (ISOVUE-300) INJECTION 61% COMPARISON:  None. FINDINGS: Pharynx and larynx: Diffuse laryngeal edema bilaterally. There is edema in the aryepiglottic folds and larynx. The vocal cords are obscured by edema. The airway appears very small through the vocal cords due to edema. No fluid collection or mass. Tonsils are normal.  Tongue is normal.  Epiglottis normal. Salivary glands: Parotid and submandibular glands normal bilaterally. Thyroid: Negative Lymph nodes: No enlarged lymph nodes in the neck. Vascular: Carotid artery atherosclerotic disease bilaterally. Carotid artery and jugular vein patent bilaterally. Limited intracranial: Negative Visualized orbits: Negative Mastoids and visualized paranasal sinuses: Mucosal edema and air-fluid level right maxillary sinus. Mucosal edema in the ethmoid sinuses bilaterally. Mastoid sinus clear bilaterally. Skeleton: Mild cervical spine degenerative change. No fracture or skeletal mass lesion. Upper chest: Lung apices clear with mild scarring in the apices.  IMPRESSION: Diffuse laryngeal edema causing marked airway narrowing.  No mass or abscess. Otherwise no acute abnormality.  Normal epiglottis These results were called by telephone at the time of interpretation on 02/28/2016 at 1:31 pm to Dr. Quintella Reichert , who verbally acknowledged these results. Electronically Signed   By: Franchot Gallo M.D.   On: 02/28/2016 13:32   Dg Chest Port 1 View  Result Date: 02/28/2016 CLINICAL DATA:  Productive cough, fever, and shortness of breath for 2 days. EXAM: PORTABLE CHEST 1 VIEW COMPARISON:  09/26/2013 FINDINGS: The heart size and mediastinal contours are within normal limits. Pulmonary hyperinflation again seen. No evidence of pulmonary infiltrate or edema. No evidence of pneumothorax or pleural effusion. IMPRESSION: Stable pulmonary hyperinflation.  No active disease. Electronically Signed   By: Earle Gell M.D.   On: 02/28/2016 11:52    Procedures Procedures (including critical care time) CRITICAL CARE Performed by: Quintella Reichert   Total critical care time: 35 minutes  Critical care time was exclusive of separately billable procedures and treating other patients.  Critical care was necessary to treat or prevent imminent or life-threatening deterioration.  Critical care was time spent personally by me on the following activities: development of treatment plan with patient and/or surrogate as well as nursing, discussions with consultants, evaluation of patient's response to treatment, examination of patient, obtaining history from patient or surrogate, ordering and performing treatments and interventions, ordering and review of laboratory studies, ordering and review of radiographic studies, pulse oximetry and re-evaluation of patient's condition.   Medications Ordered in ED Medications  heparin injection 5,000 Units (not administered)  0.9 %  sodium chloride infusion ( Intravenous New Bag/Given 02/28/16 1641)  acetaminophen (TYLENOL) tablet 650 mg  (not administered)  famotidine (PEPCID) IVPB 20 mg premix (not administered)  ipratropium-albuterol (DUONEB) 0.5-2.5 (3) MG/3ML nebulizer solution 3 mL (not administered)  dexamethasone (DECADRON) injection 4 mg (not administered)  ALPRAZolam (XANAX) tablet 0.5 mg (not administered)  dexamethasone (DECADRON) injection 10 mg (10 mg Intravenous Given 02/28/16 1056)  sodium chloride 0.9 % bolus 2,040 mL (0 mL/kg  68 kg Intravenous Stopped 02/28/16 1515)  ceFEPIme (MAXIPIME) 2 g in dextrose 5 % 50 mL IVPB (0 g Intravenous Stopped 02/28/16 1136)  vancomycin (VANCOCIN) IVPB 1000 mg/200 mL premix (0 mg Intravenous Stopped 02/28/16 1207)  acetaminophen (TYLENOL) suppository 650 mg (650 mg Rectal Given 02/28/16 1106)  iopamidol (ISOVUE-300) 61 % injection (75 mLs  Contrast Given 02/28/16 1256)  Racepinephrine HCl 2.25 % nebulizer solution 0.5 mL (0.5 mLs Nebulization Given 02/28/16 1342)     Initial Impression / Assessment and Plan / ED Course  I have reviewed the triage vital signs and the nursing notes.  Pertinent labs & imaging results that were available during my care of the patient were reviewed by me and considered in my medical decision making (see chart for details).  Clinical Course    Patient here for evaluation of fever, sore throat, difficulty breathing. Patient is ill-appearing on initial evaluation with inspiratory stridor, hoarse voice. Concern for potential epiglottitis and was started on Decadron, antibiotics, IV fluids. A repeat assessments in the emergency department her stridor and mental status did improve but she did have persistent intermittent stridor. CT soft tissue neck with considerable laryngeal edema. She was given a nebulized racemic epi neb with no significant change in her symptoms. ENT was contacted for recommendations. Dr. Janace Hoard with ENT performed bedside evaluation and noted edema of her aryetinoids recommends admission to the ICU for observation of her airway. PCCM  consult for admission.  Pt updated  of findings of studies and need for admission for further treatment.    Final Clinical Impressions(s) / ED Diagnoses   Final diagnoses:  Supraglottitis    New Prescriptions Current Discharge Medication List       Quintella Reichert, MD 02/29/16 [phone]

## 2016-02-28 NOTE — ED Triage Notes (Signed)
Patient here with fever, shortness of breath and inability to swallow, rhonchi noted, states cannot swallow any liquids.  Got off cruise ship yesterday am, alert and oriented, skin hot to touch. Minimal redness noted to airway

## 2016-02-28 NOTE — H&P (Signed)
PULMONARY / CRITICAL CARE MEDICINE   Name: Monica Williamson MRN: [id number] DOB: 05-20-1941    ADMISSION DATE:  02/28/2016   REFERRING MD:  Janace Hoard  CHIEF COMPLAINT:  Stridor   HISTORY OF PRESENT ILLNESS:   75 yo with documented pmh who returned from a cruise 8/11 and developed sore/ cough on 8/12 which progressed to stridor and respiratory distress. She presented to [facility] ED 8/13 and was evaluated by Dr. Janace Hoard of ENT services. He performed Fiber optic exam of her throat and found piriforms obscured by swelling of arytenoids . Vocal cords functioning. ENT asked PCCM to admit, treat with abx, BD's, steroids and ICU unit to try avoid any surgical interventions.  PAST MEDICAL HISTORY :  She  has a past medical history of Anxiety; Arthritis; Atrial fibrillation ([place]); Atrial fibrillation, currently in sinus rhythm ([place]); Bronchiectasis ([place]); Childhood asthma; Cholelithiasis; Colon polyps; Complication of anesthesia; Depression; Diverticulosis; Dysrhythmia; H/O bronchiectasis; History of airborne allergies; Hypertension; Nocturia; Osteopenia; PONV (postoperative nausea and vomiting); Pulmonary nodule; Sleep related teeth grinding; Stress fracture of tibia (2014); and Vitamin D deficiency.  PAST SURGICAL HISTORY: She  has a past surgical history that includes Vesicovaginal fistula closure w/ TAH; Incision and drainage breast abscess; Vaginal hysterectomy; Cholecystectomy; Breast surgery; Meniscus repair (Right, 2014); Total knee arthroplasty (Right, 09/30/2013); Joint replacement (Right, 09/2013); Total knee arthroplasty (Left, 02/24/2014); and Total knee arthroplasty (Left, 02/24/2014).  Allergies  Allergen Reactions  . Warfarin And Related Hives and Itching  . Avelox [Moxifloxacin] Diarrhea    No current facility-administered medications on file prior to encounter.    Current Outpatient Prescriptions on File Prior to Encounter  Medication Sig  . albuterol (PROVENTIL HFA;VENTOLIN HFA) 108 (90  BASE) MCG/ACT inhaler Inhale 2 puffs into the lungs 2 (two) times daily as needed for wheezing or shortness of breath.   . ALPRAZolam (XANAX) 0.5 MG tablet Take 0.25 mg by mouth at bedtime as needed for anxiety or sleep.   [name] buPROPion (WELLBUTRIN XL) 150 MG 24 hr tablet Take 150 mg by mouth every morning.   . Calcium Carbonate-Vit D-Min (CALCIUM 1200) 1200-1000 MG-UNIT CHEW Chew 1 tablet by mouth every morning.   . Cholecalciferol (VITAMIN D) 1000 UNITS capsule Take 1,000 Units by mouth 2 (two) times daily.   [name] diltiazem (DILT-XR) 180 MG 24 hr capsule Take 1 capsule (180 mg total) by mouth daily.  . fluticasone (FLOVENT HFA) 110 MCG/ACT inhaler Inhale 2 puffs into the lungs 2 (two) times daily as needed (for shortness of breath).   . propranolol (INDERAL) 10 MG tablet Take 1 tablet (10 mg total) by mouth 4 (four) times daily as needed (FOR PALPITATIONS Can take ONE every 30 minutes Up to 4 Doses).  . rivaroxaban (XARELTO) 20 MG TABS tablet Take 1 tablet (20 mg total) by mouth daily with supper.  . valsartan (DIOVAN) 160 MG tablet Take 160 mg by mouth daily.    FAMILY HISTORY:  Her indicated that her mother is deceased. She indicated that her father is deceased. She indicated that her sister is alive. She indicated that her brother is alive. She indicated that the status of her neg hx is unknown.    SOCIAL HISTORY: She  reports that she quit smoking about 37 years ago. She has a 30.00 pack-year smoking history. She has never used smokeless tobacco. She reports that she does not use drugs.  REVIEW OF SYSTEMS:   10 point review of system taken, please see HPI for positives and negatives.  SUBJECTIVE:  NAD at rest  VITAL SIGNS: BP 165/69   Pulse 80   Temp 99.7 F (37.6 C) (Oral)   Resp 15   Ht 5\' 7"  (1.702 m)   Wt 150 lb (68 kg)   SpO2 98%   BMI 23.49 kg/m   HEMODYNAMICS:    VENTILATOR SETTINGS:    INTAKE / OUTPUT: No intake/output data recorded.  PHYSICAL  EXAMINATION: General:  WNWDWF in NAD. Neuro:  Intact. HEENT:  Mild raspy voice, Oral pharynx with mild swelling Cardiovascular:  HSR RRR Lungs: CTA Abdomen:  Soft +bs Musculoskeletal:  Intact Skin:  Warm and dry  LABS:  BMET  Recent Labs Lab 02/28/16 1044 02/28/16 1101  NA 134* 136  K 3.7 3.7  CL 99* 98*  CO2 25  --   BUN 11 15  CREATININE 0.97 0.90  GLUCOSE 136* 133*    Electrolytes  Recent Labs Lab 02/28/16 1044  CALCIUM 9.4    CBC  Recent Labs Lab 02/28/16 1044 02/28/16 1101  WBC 8.5  --   HGB 13.6 15.0  HCT 41.9 44.0  PLT 176  --     Coag's No results for input(s): APTT, INR in the last 168 hours.  Sepsis Markers  Recent Labs Lab 02/28/16 1101  LATICACIDVEN 1.12    ABG No results for input(s): PHART, PCO2ART, PO2ART in the last 168 hours.  Liver Enzymes  Recent Labs Lab 02/28/16 1044  AST 74*  ALT 61*  ALKPHOS 65  BILITOT 0.6  ALBUMIN 4.5    Cardiac Enzymes No results for input(s): TROPONINI, PROBNP in the last 168 hours.  Glucose No results for input(s): GLUCAP in the last 168 hours.  Imaging Ct Soft Tissue Neck W Contrast  Result Date: 02/28/2016 CLINICAL DATA:  Fever and stridor EXAM: CT NECK WITH CONTRAST TECHNIQUE: Multidetector CT imaging of the neck was performed using the standard protocol following the bolus administration of intravenous contrast. CONTRAST:  28mL ISOVUE-300 IOPAMIDOL (ISOVUE-300) INJECTION 61% COMPARISON:  None. FINDINGS: Pharynx and larynx: Diffuse laryngeal edema bilaterally. There is edema in the aryepiglottic folds and larynx. The vocal cords are obscured by edema. The airway appears very small through the vocal cords due to edema. No fluid collection or mass. Tonsils are normal.  Tongue is normal.  Epiglottis normal. Salivary glands: Parotid and submandibular glands normal bilaterally. Thyroid: Negative Lymph nodes: No enlarged lymph nodes in the neck. Vascular: Carotid artery atherosclerotic disease  bilaterally. Carotid artery and jugular vein patent bilaterally. Limited intracranial: Negative Visualized orbits: Negative Mastoids and visualized paranasal sinuses: Mucosal edema and air-fluid level right maxillary sinus. Mucosal edema in the ethmoid sinuses bilaterally. Mastoid sinus clear bilaterally. Skeleton: Mild cervical spine degenerative change. No fracture or skeletal mass lesion. Upper chest: Lung apices clear with mild scarring in the apices. IMPRESSION: Diffuse laryngeal edema causing marked airway narrowing. No mass or abscess. Otherwise no acute abnormality.  Normal epiglottis These results were called by telephone at the time of interpretation on 02/28/2016 at 1:31 pm to Dr. Quintella Reichert , who verbally acknowledged these results. Electronically Signed   By: Franchot Gallo M.D.   On: 02/28/2016 13:32   Dg Chest Port 1 View  Result Date: 02/28/2016 CLINICAL DATA:  Productive cough, fever, and shortness of breath for 2 days. EXAM: PORTABLE CHEST 1 VIEW COMPARISON:  09/26/2013 FINDINGS: The heart size and mediastinal contours are within normal limits. Pulmonary hyperinflation again seen. No evidence of pulmonary infiltrate or edema. No evidence of pneumothorax or pleural effusion.  IMPRESSION: Stable pulmonary hyperinflation.  No active disease. Electronically Signed   By: Earle Gell M.D.   On: Mar 18, 2016 11:52     STUDIES:  Ct AS ABOVE  CULTURES: 03-19-2023 bc x 2>>  ANTIBIOTICS: 03-19-23 vanc>> 2023-03-19 zoysn>>  SIGNIFICANT EVENTS: March 19, 2023 stridor   LINES/TUBES:   DISCUSSION: 75 yo with documented pmh who returned from a cruise 8/11 and developed sore/ cough on 8/12 which progressed to stridor and respiratory distress. She presented to [facility] ED Mar 19, 2023 and was evaluated by Dr. Janace Hoard of ENT services. He performed Fiber optic exam of her throat and found piriforms obscured by swelling of arytenoids . Vocal cords functioning. ENT asked PCCM to admit, treat with abx, BD's, steroids and ICU unit to  try avoid any surgical interventions.  ASSESSMENT / PLAN:  PULMONARY A: Hx of bronchiectasis followed by [facility] Remote smoker P:   BD's O2 as needed  CARDIOVASCULAR A:  PAF HTN P:  Hold antihypertensives in case she develops sepsis  RENAL A:   No acute issue P:   Follow lytes  GASTROINTESTINAL A:   No acute issue P:   GI protection  HEMATOLOGIC A:   Chronic anticoagulation with xarelto for A fib P:  Hold xarelto in case Surgical intervention needed. No heparin at this time.  INFECTIOUS A:   Epiglottitis from presumed bacterial source P:   #0 v/z. DC vanc if mrsa swab comes back negative  ENDOCRINE A:   No acute issue P:     NEUROLOGIC A:   Anxiety P:   RASS goal: o Continue home anxiolytics    FAMILY  - Updates: None at bedside  - Inter-disciplinary family meet or Palliative Care meeting due by:  8/17   Richardson Landry Cadee Agro ACNP Maryanna Shape PCCM Pager [phone] till 3 pm If no answer page [phone] 03-18-16, 2:25 PM

## 2016-02-29 ENCOUNTER — Encounter (HOSPITAL_COMMUNITY): Payer: Self-pay | Admitting: *Deleted

## 2016-02-29 DIAGNOSIS — I10 Essential (primary) hypertension: Secondary | ICD-10-CM

## 2016-02-29 LAB — URINE CULTURE

## 2016-02-29 LAB — CBC
HEMATOCRIT: 38.6 % (ref 36.0–46.0)
HEMOGLOBIN: 12.4 g/dL (ref 12.0–15.0)
MCH: 31.1 pg (ref 26.0–34.0)
MCHC: 32.1 g/dL (ref 30.0–36.0)
MCV: 96.7 fL (ref 78.0–100.0)
Platelets: 149 10*3/uL — ABNORMAL LOW (ref 150–400)
RBC: 3.99 MIL/uL (ref 3.87–5.11)
RDW: 14.9 % (ref 11.5–15.5)
WBC: 7.8 10*3/uL (ref 4.0–10.5)

## 2016-02-29 LAB — BASIC METABOLIC PANEL
ANION GAP: 10 (ref 5–15)
BUN: 9 mg/dL (ref 6–20)
CHLORIDE: 107 mmol/L (ref 101–111)
CO2: 22 mmol/L (ref 22–32)
Calcium: 8.1 mg/dL — ABNORMAL LOW (ref 8.9–10.3)
Creatinine, Ser: 0.89 mg/dL (ref 0.44–1.00)
GFR calc non Af Amer: >60 mL/min (ref >60)
Glucose, Bld: 155 mg/dL — ABNORMAL HIGH (ref 65–99)
Potassium: 3.1 mmol/L — ABNORMAL LOW (ref 3.5–5.1)
Sodium: 139 mmol/L (ref 135–145)

## 2016-02-29 MED ORDER — PROPRANOLOL HCL 20 MG/5ML PO SOLN: 10.0000 mg | Freq: Four times a day (QID) | ORAL | Status: DC

## 2016-02-29 MED ORDER — PROPRANOLOL HCL 10 MG PO TABS
10.0000 mg | ORAL_TABLET | Freq: Four times a day (QID) | ORAL | Status: DC
  Filled 2016-02-29 (×2): qty 1

## 2016-02-29 MED ORDER — POTASSIUM CHLORIDE 10 MEQ/100ML IV SOLN
10.0000 meq | INTRAVENOUS | Status: AC
  Administered 2016-02-29 (×4): 10 meq via INTRAVENOUS
  Filled 2016-02-29 (×5): qty 100

## 2016-02-29 MED ORDER — AMOXICILLIN-POT CLAVULANATE 875-125 MG PO TABS
1.0000 | ORAL_TABLET | Freq: Two times a day (BID) | ORAL | Status: DC
  Administered 2016-02-29 – 2016-03-01 (×2): 1 via ORAL
  Filled 2016-02-29 (×3): qty 1

## 2016-02-29 MED ORDER — RIVAROXABAN 20 MG PO TABS
20.0000 mg | ORAL_TABLET | Freq: Every day | ORAL | Status: DC
  Administered 2016-02-29: 20 mg via ORAL
  Filled 2016-02-29 (×2): qty 1

## 2016-02-29 MED ORDER — DILTIAZEM HCL ER COATED BEADS 180 MG PO CP24
180.0000 mg | ORAL_CAPSULE | Freq: Every day | ORAL | Status: DC
  Administered 2016-03-01: 180 mg via ORAL
  Filled 2016-02-29: qty 1

## 2016-02-29 MED ORDER — PREDNISONE 20 MG PO TABS
40.0000 mg | ORAL_TABLET | Freq: Every day | ORAL | Status: DC
  Administered 2016-03-01: 40 mg via ORAL
  Filled 2016-02-29: qty 2

## 2016-02-29 NOTE — Care Management Note (Signed)
Case Management Note  Patient Details  Name: Monica Williamson MRN: [id number] Date of Birth: [birth year]  Subjective/Objective:  Admitted with stridor                  Action/Plan:  PTA independent from home.  CM will continue to follow for discharge needs   Expected Discharge Date:  03/01/16               Expected Discharge Plan:  Home/Self Care  In-House Referral:  Clinical Social Work  Discharge planning Services     Post Acute Care Choice:    Choice offered to:     DME Arranged:    DME Agency:     HH Arranged:    [place] Agency:     Status of Service:  In process, will continue to follow  If discussed at Long Length of Stay Meetings, dates discussed:    Additional Comments:  Maryclare Labrador, RN 02/29/2016, 2:21 PM

## 2016-02-29 NOTE — Progress Notes (Signed)
Subjective: She feels great. Taking fluids well. No further swallowing problem.   Objective: Vital signs in last 24 hours: Temp:  [97.9 F (36.6 C)-103 F (39.4 C)] 98.3 F (36.8 C) (08/14 0751) Pulse Rate:  [63-96] 71 (08/14 0700) Resp:  [10-26] 21 (08/14 0700) BP: (112-173)/(48-142) 147/66 (08/14 0700) SpO2:  [93 %-99 %] 94 % (08/14 0700) Weight:  [68 kg (150 lb)-70 kg (154 lb 5.2 oz)] 70 kg (154 lb 5.2 oz) (08/14 0200) Last BM Date: 02/28/16  Intake/Output from previous day: 08/13 0701 - 08/14 0700 In: 1163.8 [P.O.:90; I.V.:923.8; IV Piggyback:150] Out: 1425 [Urine:1400; Stool:25] Intake/Output this shift: No intake/output data recorded.  wake alert. no stridor. voice is normal she looks completely well. no swelling  Lab Results:   Recent Labs  02/28/16 1044 02/28/16 1101 02/29/16 0229  WBC 8.5  --  7.8  HGB 13.6 15.0 12.4  HCT 41.9 44.0 38.6  PLT 176  --  149*   BMET  Recent Labs  02/28/16 1044 02/28/16 1101 02/29/16 0229  NA 134* 136 139  K 3.7 3.7 3.1*  CL 99* 98* 107  CO2 25  --  22  GLUCOSE 136* 133* 155*  BUN 11 15 9   CREATININE 0.97 0.90 0.89  CALCIUM 9.4  --  8.1*   PT/INR  Recent Labs  02/28/16 1624  LABPROT 13.9  INR 1.07   ABG No results for input(s): PHART, HCO3 in the last 72 hours.  Invalid input(s): PCO2, PO2  Studies/Results: Ct Soft Tissue Neck W Contrast  Result Date: 02/28/2016 CLINICAL DATA:  Fever and stridor EXAM: CT NECK WITH CONTRAST TECHNIQUE: Multidetector CT imaging of the neck was performed using the standard protocol following the bolus administration of intravenous contrast. CONTRAST:  44mL ISOVUE-300 IOPAMIDOL (ISOVUE-300) INJECTION 61% COMPARISON:  None. FINDINGS: Pharynx and larynx: Diffuse laryngeal edema bilaterally. There is edema in the aryepiglottic folds and larynx. The vocal cords are obscured by edema. The airway appears very small through the vocal cords due to edema. No fluid collection or mass.  Tonsils are normal.  Tongue is normal.  Epiglottis normal. Salivary glands: Parotid and submandibular glands normal bilaterally. Thyroid: Negative Lymph nodes: No enlarged lymph nodes in the neck. Vascular: Carotid artery atherosclerotic disease bilaterally. Carotid artery and jugular vein patent bilaterally. Limited intracranial: Negative Visualized orbits: Negative Mastoids and visualized paranasal sinuses: Mucosal edema and air-fluid level right maxillary sinus. Mucosal edema in the ethmoid sinuses bilaterally. Mastoid sinus clear bilaterally. Skeleton: Mild cervical spine degenerative change. No fracture or skeletal mass lesion. Upper chest: Lung apices clear with mild scarring in the apices. IMPRESSION: Diffuse laryngeal edema causing marked airway narrowing. No mass or abscess. Otherwise no acute abnormality.  Normal epiglottis These results were called by telephone at the time of interpretation on 02/28/2016 at 1:31 pm to Dr. Quintella Reichert , who verbally acknowledged these results. Electronically Signed   By: Franchot Gallo M.D.   On: 02/28/2016 13:32   Dg Chest Port 1 View  Result Date: 02/28/2016 CLINICAL DATA:  Productive cough, fever, and shortness of breath for 2 days. EXAM: PORTABLE CHEST 1 VIEW COMPARISON:  09/26/2013 FINDINGS: The heart size and mediastinal contours are within normal limits. Pulmonary hyperinflation again seen. No evidence of pulmonary infiltrate or edema. No evidence of pneumothorax or pleural effusion. IMPRESSION: Stable pulmonary hyperinflation.  No active disease. Electronically Signed   By: Earle Gell M.D.   On: 02/28/2016 11:52    Anti-infectives: Anti-infectives    Start  Dose/Rate Route Frequency Ordered Stop   02/29/16 1000  piperacillin-tazobactam (ZOSYN) IVPB 3.375 g     3.375 g 12.5 mL/hr over 240 Minutes Intravenous Every 8 hours 02/28/16 1712     02/28/16 1800  vancomycin (VANCOCIN) 500 mg in sodium chloride 0.9 % 100 mL IVPB     500 mg 100 mL/hr over  60 Minutes Intravenous Every 12 hours 02/28/16 1712     02/28/16 1100  ceFEPIme (MAXIPIME) 2 g in dextrose 5 % 50 mL IVPB     2 g 100 mL/hr over 30 Minutes Intravenous  Once 02/28/16 1045 02/28/16 1136   02/28/16 1100  vancomycin (VANCOCIN) IVPB 1000 mg/200 mL premix     1,000 mg 200 mL/hr over 60 Minutes Intravenous  Once 02/28/16 1045 02/28/16 1207      Assessment/Plan: s/p * No surgery found * she seems to have resolved the airway issue and swallowing now back to close to normal. she can be discharged from my standpoint. follow up if any further issues. It would seem clindamycin would be good home med choice.   LOS: 1 day    Melissa Montane 02/29/2016

## 2016-02-29 NOTE — Progress Notes (Signed)
PULMONARY / CRITICAL CARE MEDICINE   Name: Monica Williamson MRN: [id number] DOB: [birth year]    ADMISSION DATE:  02/28/2016   REFERRING MD:  Janace Hoard  CHIEF COMPLAINT:  Stridor   HISTORY OF PRESENT ILLNESS:   75 yo with documented pmh who returned from a cruise 8/11 and developed sore/ cough on 8/12 which progressed to stridor and respiratory distress. She presented to [facility] ED 8/13 and was evaluated by Dr. Janace Hoard of ENT services. He performed Fiber optic exam of her throat and found piriforms obscured by swelling of arytenoids . Vocal cords functioning. ENT asked PCCM to admit, treat with abx, BD's, steroids and ICU unit to try avoid any surgical interventions.  SUBJECTIVE:  No events overnight.  VITAL SIGNS: BP (!) 158/129 (BP Location: Right Arm)   Pulse 71   Temp 98.5 F (36.9 C) (Oral)   Resp 13   Ht 5\' 7"  (1.702 m)   Wt 70 kg (154 lb 5.2 oz)   SpO2 97%   BMI 24.17 kg/m   HEMODYNAMICS:    VENTILATOR SETTINGS:    INTAKE / OUTPUT: I/O last 3 completed shifts: In: 1363.8 [P.O.:90; I.V.:923.8; IV Piggyback:350] Out: [id number] [Urine:1400; Stool:25]  PHYSICAL EXAMINATION: General:  WNWDWF in NAD. Neuro:  Intact.  Moving all ext to command. HEENT:  Mild raspy voice, Oral pharynx with mild swelling Cardiovascular:  HSR RRR Lungs: CTA Abdomen:  Soft +bs Musculoskeletal:  Intact Skin:  Warm and dry  LABS:  BMET  Recent Labs Lab 02/28/16 1044 02/28/16 1101 02/29/16 0229  NA 134* 136 139  K 3.7 3.7 3.1*  CL 99* 98* 107  CO2 25  --  22  BUN 11 15 9   CREATININE 0.97 0.90 0.89  GLUCOSE 136* 133* 155*   Electrolytes  Recent Labs Lab 02/28/16 1044 02/28/16 1624 02/29/16 0229  CALCIUM 9.4  --  8.1*  MG  --  1.9  --   PHOS  --  2.4*  --    CBC  Recent Labs Lab 02/28/16 1044 02/28/16 1101 02/29/16 0229  WBC 8.5  --  7.8  HGB 13.6 15.0 12.4  HCT 41.9 44.0 38.6  PLT 176  --  149*   Coag's  Recent Labs Lab 02/28/16 1624  APTT 40*  INR 1.07    Sepsis Markers  Recent Labs Lab 02/28/16 1101 02/28/16 1445 02/28/16 1624  LATICACIDVEN 1.12 1.63  --   PROCALCITON  --   --  0.11   ABG No results for input(s): PHART, PCO2ART, PO2ART in the last 168 hours.  Liver Enzymes  Recent Labs Lab 02/28/16 1044  AST 74*  ALT 61*  ALKPHOS 65  BILITOT 0.6  ALBUMIN 4.5   Cardiac Enzymes No results for input(s): TROPONINI, PROBNP in the last 168 hours.  Glucose  Recent Labs Lab 02/28/16 1707  GLUCAP 148*   Imaging Ct Soft Tissue Neck W Contrast  Result Date: 02/28/2016 CLINICAL DATA:  Fever and stridor EXAM: CT NECK WITH CONTRAST TECHNIQUE: Multidetector CT imaging of the neck was performed using the standard protocol following the bolus administration of intravenous contrast. CONTRAST:  12mL ISOVUE-300 IOPAMIDOL (ISOVUE-300) INJECTION 61% COMPARISON:  None. FINDINGS: Pharynx and larynx: Diffuse laryngeal edema bilaterally. There is edema in the aryepiglottic folds and larynx. The vocal cords are obscured by edema. The airway appears very small through the vocal cords due to edema. No fluid collection or mass. Tonsils are normal.  Tongue is normal.  Epiglottis normal. Salivary glands: Parotid and submandibular glands normal  bilaterally. Thyroid: Negative Lymph nodes: No enlarged lymph nodes in the neck. Vascular: Carotid artery atherosclerotic disease bilaterally. Carotid artery and jugular vein patent bilaterally. Limited intracranial: Negative Visualized orbits: Negative Mastoids and visualized paranasal sinuses: Mucosal edema and air-fluid level right maxillary sinus. Mucosal edema in the ethmoid sinuses bilaterally. Mastoid sinus clear bilaterally. Skeleton: Mild cervical spine degenerative change. No fracture or skeletal mass lesion. Upper chest: Lung apices clear with mild scarring in the apices. IMPRESSION: Diffuse laryngeal edema causing marked airway narrowing. No mass or abscess. Otherwise no acute abnormality.  Normal  epiglottis These results were called by telephone at the time of interpretation on 03/13/2016 at 1:31 pm to Dr. Quintella Reichert , who verbally acknowledged these results. Electronically Signed   By: Franchot Gallo M.D.   On: 03/13/2016 13:32   STUDIES:  Ct AS ABOVE  CULTURES: 2023-03-14 bc x 2>>  ANTIBIOTICS: Mar 14, 2023 vanc>>8/14 03-14-23 zoysn>>8/14 Augmentin 8/14>>>8/20  SIGNIFICANT EVENTS: 2023-03-14 stridor   LINES/TUBES:  I reviewed CXR myself, no acute disease.  DISCUSSION: 75 yo with documented pmh who returned from a cruise 8/11 and developed sore/ cough on 8/12 which progressed to stridor and respiratory distress. She presented to [facility] ED 03/14/2023 and was evaluated by Dr. Janace Hoard of ENT services. He performed Fiber optic exam of her throat and found piriforms obscured by swelling of arytenoids . Vocal cords functioning. ENT asked PCCM to admit, treat with abx, BD's, steroids and ICU unit to try avoid any surgical interventions.  ASSESSMENT / PLAN:  PULMONARY A: Hx of bronchiectasis followed by [facility] Remote smoker P:   BD's. O2 as needed. Monitor.  CARDIOVASCULAR A:  PAF HTN P:  Restart home dose of propranolol with holding parameters. D/C tele monitoring.  RENAL A:   No acute issue P:   Follow lytes Replace electrolytes. KVO IVF.  GASTROINTESTINAL A:   No acute issue P:   GI protection Regular diet.  HEMATOLOGIC A:   Chronic anticoagulation with xarelto for A fib P:  Restart xarelto.  INFECTIOUS A:   Epiglottitis from presumed bacterial source P:   D/C vanc/zosyn. Augmentin per pharmacy with total abx days of 7 days. F/U on cultures.  ENDOCRINE A:   No acute issue P:   Monitor  NEUROLOGIC A:   Anxiety P:   RASS goal: o Continue home anxiolytics   Discussed with bedside RN and  FAMILY  - Updates: Patient and husband updated bedside.  - Inter-disciplinary family meet or Palliative Care meeting due by:  8/17  Rush Farmer, M.D. [facility]  Pulmonary/Critical Care Medicine. Pager: [phone]. After hours pager: [phone].

## 2016-02-29 NOTE — Progress Notes (Signed)
[facility] ADULT ICU REPLACEMENT PROTOCOL FOR AM LAB REPLACEMENT ONLY  The patient does apply for the [facility] Adult ICU Electrolyte Replacment Protocol based on the criteria listed below:   1. Is GFR >/= 40 ml/min? Yes.    Patient's GFR today is >60 2. Is urine output >/= 0.5 ml/kg/hr for the last 6 hours? Yes.   Patient's UOP is 1.0 ml/kg/hr 3. Is BUN < 60 mg/dL? Yes.    Patient's BUN today is 9 4. Abnormal electrolyte(s): K+3.1 5. Ordered repletion with:Protocol 6. If a panic level lab has been reported, has the CCM MD in charge been notified? No..   Physician:  Arlester Marker, [place] 02/29/2016 5:08 AM

## 2016-02-29 NOTE — Progress Notes (Signed)
PT Cancellation Note  Patient Details Name: Monica Williamson MRN: [id number] DOB: 04-Jul-1941   Cancelled Treatment:    Reason Eval/Treat Not Completed: PT screened, no needs identified, will sign off (Pt mobilizing independently without difficulty per nsg)   Griffin Dewilde 02/29/2016, 1:57 PM [facility] PT [phone]

## 2016-03-01 LAB — BASIC METABOLIC PANEL
ANION GAP: 10 (ref 5–15)
BUN: 16 mg/dL (ref 6–20)
CO2: 23 mmol/L (ref 22–32)
Calcium: 8.3 mg/dL — ABNORMAL LOW (ref 8.9–10.3)
Chloride: 107 mmol/L (ref 101–111)
Creatinine, Ser: 0.77 mg/dL (ref 0.44–1.00)
GFR calc Af Amer: >60 mL/min (ref >60)
Glucose, Bld: 120 mg/dL — ABNORMAL HIGH (ref 65–99)
POTASSIUM: 3.8 mmol/L (ref 3.5–5.1)
Sodium: 140 mmol/L (ref 135–145)

## 2016-03-01 LAB — CBC
HEMATOCRIT: 34.5 % — AB (ref 36.0–46.0)
Hemoglobin: 11.2 g/dL — ABNORMAL LOW (ref 12.0–15.0)
MCH: 31.3 pg (ref 26.0–34.0)
MCHC: 32.5 g/dL (ref 30.0–36.0)
MCV: 96.4 fL (ref 78.0–100.0)
Platelets: 146 10*3/uL — ABNORMAL LOW (ref 150–400)
RBC: 3.58 MIL/uL — AB (ref 3.87–5.11)
RDW: 15.1 % (ref 11.5–15.5)
WBC: 7.7 10*3/uL (ref 4.0–10.5)

## 2016-03-01 LAB — MAGNESIUM: MAGNESIUM: 2.2 mg/dL (ref 1.7–2.4)

## 2016-03-01 LAB — PHOSPHORUS: PHOSPHORUS: 2 mg/dL — AB (ref 2.5–4.6)

## 2016-03-01 MED ORDER — AMOXICILLIN-POT CLAVULANATE 875-125 MG PO TABS: 1.0000 | ORAL_TABLET | Freq: Two times a day (BID) | ORAL | 0 refills | Status: AC

## 2016-03-01 MED ORDER — PREDNISONE 10 MG PO TABS: ORAL_TABLET | ORAL | 0 refills | Status: DC

## 2016-03-01 NOTE — Care Management Note (Signed)
Case Management Note  Patient Details  Name: MEGIN PAXTON MRN: [id number] Date of Birth: Jan 22, 1941  Subjective/Objective:   Stridor, shortness of breath                 Action/Plan: Discharge Planning: AVS reviewed: Chart reviewed. No NCM needs identifed.   Dr Marton Redwood PCP  Expected Discharge Date:  03/01/16               Expected Discharge Plan:  Home/Self Care  In-House Referral:  Clinical Social Work  Discharge planning Services     Post Acute Care Choice:  NA Choice offered to:  NA  DME Arranged:  N/A DME Agency:  NA  HH Arranged:  NA HH Agency:  NA  Status of Service:  Completed, signed off  If discussed at [employer] of Stay Meetings, dates discussed:    Additional Comments:  Erenest Rasher, RN 03/01/2016, 12:38 PM

## 2016-03-01 NOTE — Progress Notes (Signed)
Discussed discharge summary with patient. Reviewed all medications with patient. Patient ready for discharge. Patient transported out via wheel chair.

## 2016-03-01 NOTE — Discharge Summary (Signed)
Physician Discharge Summary       Patient ID: Monica Williamson MRN: [id number] DOB/AGE: Oct 16, 1940 75 y.o.  Admit date: 02/28/2016 Discharge date: 03/01/2016  Discharge Diagnoses:  Active Problems:   Stridor   Sepsis ([place])   Epiglottitis   History of Present Illness: 75 yo with documented past medical history of  A-fib on Xaralto, bronchiectasis,hypetension who returned from a cruise 8/11 and developed sore/ cough on 8/12 which progressed to stridor and respiratory distress. She presented to [facility] ED 8/13 and was evaluated by Dr. Janace Hoard of ENT services. He performed Fiber optic exam of her throat and found piriforms obscured by swelling of arytenoids . Vocal cords functioning. ENT asked PCCM to admit for epiglottitis, treat with abx, BD's, steroids and ICU unit to try avoid any surgical interventions. She was admitted to the ICU and monitored closely on the prescribed therapies and has rapidly improved, and is ready for discharge home 03/01/2016.    Discharge Plan by active problems    Supraglottitis - Continue Augmentin through 8/20 - Follow up in pulmonary clinic  - Take Probiotic daily while on antibiotics - Follow cultures for speciation  Hx of bronchiectasis followed by [facility] Remote smoker P:   Continue home PRN albuterol   History of PAF/ HTN P:  Continue  home dose of propranolol  Continue Xaralto as prescribed daily    Significant Hospital tests/ studies  STUDIES:  CT>> 8/13: Diffuse laryngeal edema causing marked airway narrowing. No mass or abscess. Otherwise no acute abnormality.  Normal epiglottis  Laryngoscopy>> 8/13:fiberoptic exam reveals some swelling of her arytenoids and slight amount of her area epiglottic fold and false cords but her glottis only has a slight thickening of the vocal cords   CULTURES: 8/13 bc x 2>> GS reveals moderate gram - coccobacilli                                           Few gram + cocci in pairs                   Rare gram - rods  ANTIBIOTICS: 8/13 vanc>>8/14 8/13 zoysn>>8/14 Augmentin 8/14>>>8/20   Consults :  ENT  Discharge Exam: BP (!) 149/63 (BP Location: Right Arm)   Pulse 82   Temp 98.6 F (37 C) (Oral)   Resp 18   Ht 5\' 7"  (1.702 m)   Wt 155 lb (70.3 kg)   SpO2 94%   BMI 24.28 kg/m   PHYSICAL EXAMINATION: General: Elderly female of normal body habitus  in NAD. Neuro:  Alert and oriented x 4, non-focal  Moving all ext to command. HEENT: Voice normal, Oral pharynx with very mild swelling, no stridor Cardiovascular:  HSR RRR Lungs: CTA Abdomen:  Soft +bs Musculoskeletal:  Intact Skin:  Warm and dry   Labs at discharge Lab Results  Component Value Date   CREATININE 0.77 03/01/2016   BUN 16 03/01/2016   NA 140 03/01/2016   K 3.8 03/01/2016   CL 107 03/01/2016   CO2 23 03/01/2016   Lab Results  Component Value Date   WBC 7.7 03/01/2016   HGB 11.2 (L) 03/01/2016   HCT 34.5 (L) 03/01/2016   MCV 96.4 03/01/2016   PLT 146 (L) 03/01/2016   Lab Results  Component Value Date   ALT 61 (H) 02/28/2016   AST 74 (H) 02/28/2016   ALKPHOS 65  02/28/2016   BILITOT 0.6 02/28/2016   Lab Results  Component Value Date   INR 1.07 02/28/2016   INR 0.89 02/24/2014   INR 1.47 02/17/2014    Current radiology studies Ct Soft Tissue Neck W Contrast  Result Date: 02/28/2016 CLINICAL DATA:  Fever and stridor EXAM: CT NECK WITH CONTRAST TECHNIQUE: Multidetector CT imaging of the neck was performed using the standard protocol following the bolus administration of intravenous contrast. CONTRAST:  74mL ISOVUE-300 IOPAMIDOL (ISOVUE-300) INJECTION 61% COMPARISON:  None. FINDINGS: Pharynx and larynx: Diffuse laryngeal edema bilaterally. There is edema in the aryepiglottic folds and larynx. The vocal cords are obscured by edema. The airway appears very small through the vocal cords due to edema. No fluid collection or mass. Tonsils are normal.  Tongue is normal.  Epiglottis  normal. Salivary glands: Parotid and submandibular glands normal bilaterally. Thyroid: Negative Lymph nodes: No enlarged lymph nodes in the neck. Vascular: Carotid artery atherosclerotic disease bilaterally. Carotid artery and jugular vein patent bilaterally. Limited intracranial: Negative Visualized orbits: Negative Mastoids and visualized paranasal sinuses: Mucosal edema and air-fluid level right maxillary sinus. Mucosal edema in the ethmoid sinuses bilaterally. Mastoid sinus clear bilaterally. Skeleton: Mild cervical spine degenerative change. No fracture or skeletal mass lesion. Upper chest: Lung apices clear with mild scarring in the apices. IMPRESSION: Diffuse laryngeal edema causing marked airway narrowing. No mass or abscess. Otherwise no acute abnormality.  Normal epiglottis These results were called by telephone at the time of interpretation on 02/28/2016 at 1:31 pm to Dr. Quintella Reichert , who verbally acknowledged these results. Electronically Signed   By: Franchot Gallo M.D.   On: 02/28/2016 13:32    Disposition:  01-Home or Self Care  Discharge Instructions    Call MD for:  difficulty breathing, headache or visual disturbances    Complete by:  As directed   Call MD for:  severe uncontrolled pain    Complete by:  As directed   Call MD for:  temperature >100.4    Complete by:  As directed   Diet - low sodium heart healthy    Complete by:  As directed   Discharge instructions    Complete by:  As directed   Do not over exert yourself. Frequent handwashing.   Increase activity slowly    Complete by:  As directed       Medication List    STOP taking these medications   Vitamin D 1000 units capsule     TAKE these medications   acetaminophen 500 MG tablet Commonly known as:  TYLENOL Take 1,000 mg by mouth every 6 (six) hours as needed for moderate pain or headache.   albuterol 108 (90 Base) MCG/ACT inhaler Commonly known as:  PROVENTIL HFA;VENTOLIN HFA Inhale 2 puffs into the lungs  2 (two) times daily as needed for wheezing or shortness of breath.   ALPRAZolam 0.5 MG tablet Commonly known as:  XANAX Take 0.25 mg by mouth at bedtime as needed for anxiety or sleep.   amoxicillin-clavulanate 875-125 MG tablet Commonly known as:  AUGMENTIN Take 1 tablet by mouth every 12 (twelve) hours.   buPROPion 150 MG 24 hr tablet Commonly known as:  WELLBUTRIN XL Take 150 mg by mouth every morning.   CALCIUM 1200 1200-1000 MG-UNIT Chew Chew 1 tablet by mouth every morning.   diltiazem 180 MG 24 hr capsule Commonly known as:  DILT-XR Take 1 capsule (180 mg total) by mouth daily.   fluticasone 110 MCG/ACT inhaler Commonly known as:  FLOVENT HFA Inhale 2 puffs into the lungs 2 (two) times daily as needed (for shortness of breath).   predniSONE 10 MG tablet Commonly known as:  DELTASONE Prednisone taper; 10 mg tablets: 4 tabs x 2 days, 3 tabs x 2 days, 2 tabs x 2 days 1 tab x 2 days then stop.   propranolol 10 MG tablet Commonly known as:  INDERAL Take 1 tablet (10 mg total) by mouth 4 (four) times daily as needed (FOR PALPITATIONS Can take ONE every 30 minutes Up to 4 Doses).   rivaroxaban 20 MG Tabs tablet Commonly known as:  XARELTO Take 1 tablet (20 mg total) by mouth daily with supper.   rosuvastatin 20 MG tablet Commonly known as:  CRESTOR Take 20 mg by mouth at bedtime.   valsartan 160 MG tablet Commonly known as:  DIOVAN Take 160 mg by mouth daily.      Follow-up Information    Rexene Edison, NP Follow up on 03/09/2016.   Specialty:  Pulmonary Disease Why:   9;30 am [facility] Contact information: [address] [address] [phone]           Discharged Condition: good  Greater than 35 minutes of time have been dedicated to discharge assessment, planning and discharge instructions.   Signed: Magdalen Spatz, AGACNP-BC [place] Pager # [phone] 03/01/2016     Baltazar Apo,  MD, PhD 03/02/2016, 1:59 PM [facility] [phone] or if no answer [phone]

## 2016-03-02 ENCOUNTER — Telehealth: Payer: Self-pay | Admitting: Adult Health

## 2016-03-02 LAB — CULTURE, RESPIRATORY W GRAM STAIN

## 2016-03-02 LAB — CULTURE, RESPIRATORY: CULTURE: NORMAL

## 2016-03-02 NOTE — Telephone Encounter (Signed)
Ok thanks  Dr. Brand Males, M.D., [facility].C.P Pulmonary and Critical Care Medicine Staff Physician [place] [facility] Pager: [phone], If no answer or between  15:00h - 7:00h: call [phone]  03/02/2016 5:12 PM

## 2016-03-02 NOTE — Telephone Encounter (Signed)
Let Yevonne Pax know   1. Diarrhea significant side effect of Augmentin. If she is not able to handle the Augmentin she can go Levaquin 500 milligrams once daily through 03/06/2016 the final stop date for the Augmentin as per discharge. Let me know what she thinks  #2. We typically do not recommend probiotics while on antibiotics but if she wants to take that is okay  #3. Today 03/02/2016 is the day her granddaughter joins college please asked her how it went   Dr. Brand Males, M.D., [facility].C.P Pulmonary and Critical Care Medicine Staff Physician [place] [facility] Pager: [phone], If no answer or between  15:00h - 7:00h: call [phone]  03/02/2016 12:51 PM

## 2016-03-02 NOTE — Telephone Encounter (Signed)
Called spoke with pt. She reports the augmentin is causing her to have diarrhea.  Reports she just started a probiotic yesterday as she states no one told her to take a probiotic while on ABX. Also reports her stools are red but relates this to the tomato soup she ate last night.  She wants to know if we have received a final report on her sputum yet? Please advise MR thanks

## 2016-03-02 NOTE — Telephone Encounter (Signed)
Patient states that she does not want to switch antibiotics at this time, she said that if the diarrhea continues, she will call our office to switch at that time.  She said that she is taking the ProBiotic.  She said that they will be leaving to take her granddaughter to college later this evening.    Fyi to MR

## 2016-03-03 ENCOUNTER — Telehealth: Payer: Self-pay | Admitting: Internal Medicine

## 2016-03-03 MED ORDER — LEVOFLOXACIN 500 MG PO TABS: 500.0000 mg | ORAL_TABLET | Freq: Every day | ORAL | 0 refills | Status: DC

## 2016-03-03 NOTE — Telephone Encounter (Signed)
Pt is aware of recs. RX sent in. Nothing further needed 

## 2016-03-03 NOTE — Telephone Encounter (Signed)
Reaction to avelox was just diarrhea.  Ok to use the levaquin, suggesting she take it with food in her stomach, and that she also take an otc probiotic like [employer].

## 2016-03-03 NOTE — Telephone Encounter (Signed)
Pt has an allergy to moxifloxacin so a big yellow flag comes up. Mr is scheduled for 11pm elink. Will forward to DOD to see if okay to still send in levaquin for pt.  Please advise Dr. Annamaria Boots thanks  Allergies  Allergen Reactions  . Warfarin And Related Hives and Itching  . Augmentin [Amoxicillin-Pot Clavulanate] Rash  . Avelox [Moxifloxacin] Diarrhea     Current Outpatient Prescriptions on File Prior to Visit  Medication Sig Dispense Refill  . acetaminophen (TYLENOL) 500 MG tablet Take 1,000 mg by mouth every 6 (six) hours as needed for moderate pain or headache.    . albuterol (PROVENTIL HFA;VENTOLIN HFA) 108 (90 BASE) MCG/ACT inhaler Inhale 2 puffs into the lungs 2 (two) times daily as needed for wheezing or shortness of breath.     . ALPRAZolam (XANAX) 0.5 MG tablet Take 0.25 mg by mouth at bedtime as needed for anxiety or sleep.     [name] amoxicillin-clavulanate (AUGMENTIN) 875-125 MG tablet Take 1 tablet by mouth every 12 (twelve) hours. 11 tablet 0  . buPROPion (WELLBUTRIN XL) 150 MG 24 hr tablet Take 150 mg by mouth every morning.  30 tablet 11  . Calcium Carbonate-Vit D-Min (CALCIUM 1200) 1200-1000 MG-UNIT CHEW Chew 1 tablet by mouth every morning.     . diltiazem (DILT-XR) 180 MG 24 hr capsule Take 1 capsule (180 mg total) by mouth daily. 90 capsule 3  . fluticasone (FLOVENT HFA) 110 MCG/ACT inhaler Inhale 2 puffs into the lungs 2 (two) times daily as needed (for shortness of breath).     . predniSONE (DELTASONE) 10 MG tablet Prednisone taper; 10 mg tablets: 4 tabs x 2 days, 3 tabs x 2 days, 2 tabs x 2 days 1 tab x 2 days then stop. 20 tablet 0  . propranolol (INDERAL) 10 MG tablet Take 1 tablet (10 mg total) by mouth 4 (four) times daily as needed (FOR PALPITATIONS Can take ONE every 30 minutes Up to 4 Doses). 30 tablet 0  . rivaroxaban (XARELTO) 20 MG TABS tablet Take 1 tablet (20 mg total) by mouth daily with supper. 90 tablet 1  . rosuvastatin (CRESTOR) 20 MG tablet Take 20 mg by  mouth at bedtime.    . valsartan (DIOVAN) 160 MG tablet Take 160 mg by mouth daily.     No current facility-administered medications on file prior to visit.

## 2016-03-03 NOTE — Telephone Encounter (Signed)
agrree no augmentin for her and other penicillins  -please send this to Adonis Housekeeper, MD  As well  She can try levaquin 500mg  daily for the remaineder - I think till 03/06/16 ; starting 03/04/16 if she took augmentin 03/03/2016   Dr. Brand Males, M.D., [facility].C.P Pulmonary and Critical Care Medicine Staff Physician [place] [facility] Pager: [phone], If no answer or between  15:00h - 7:00h: call [phone]  03/03/2016 12:46 PM

## 2016-03-03 NOTE — Telephone Encounter (Signed)
Spoke with pt. She saw MR in the hospital. Feels she is having a reaction to Augmentin. Reports a red rash on her arms and numbness in her upper lip. States that this reaction started after taking a few doses. Wanted to know if she could take Benadryl, advised her that she could and to not take anymore Augmentin until she hears back from [place].  *Augmentin has been added to he pt's allergy list.*  MR - please advise. Thanks.

## 2016-03-03 NOTE — Progress Notes (Signed)
lmtcb for pt.  

## 2016-03-04 ENCOUNTER — Telehealth: Payer: Self-pay | Admitting: Internal Medicine

## 2016-03-04 LAB — CULTURE, BLOOD (ROUTINE X 2)
CULTURE: NO GROWTH
Culture: NO GROWTH

## 2016-03-04 NOTE — Telephone Encounter (Signed)
Patient notified of culture results. Nothing further needed.

## 2016-03-08 DIAGNOSIS — R061 Stridor: Secondary | ICD-10-CM | POA: Diagnosis not present

## 2016-03-08 DIAGNOSIS — Z6823 Body mass index (BMI) 23.0-23.9, adult: Secondary | ICD-10-CM | POA: Diagnosis not present

## 2016-03-08 DIAGNOSIS — J0511 Acute epiglottitis with obstruction: Secondary | ICD-10-CM | POA: Diagnosis not present

## 2016-03-08 DIAGNOSIS — R05 Cough: Secondary | ICD-10-CM | POA: Diagnosis not present

## 2016-03-09 ENCOUNTER — Encounter: Payer: Self-pay | Admitting: Adult Health

## 2016-03-09 ENCOUNTER — Ambulatory Visit (INDEPENDENT_AMBULATORY_CARE_PROVIDER_SITE_OTHER): Payer: Medicare Other | Admitting: Adult Health

## 2016-03-09 DIAGNOSIS — R061 Stridor: Secondary | ICD-10-CM

## 2016-03-09 DIAGNOSIS — J4520 Mild intermittent asthma, uncomplicated: Secondary | ICD-10-CM | POA: Diagnosis not present

## 2016-03-09 DIAGNOSIS — I6529 Occlusion and stenosis of unspecified carotid artery: Secondary | ICD-10-CM

## 2016-03-09 MED ORDER — FLUTICASONE PROPIONATE HFA 110 MCG/ACT IN AERO: 2.0000 | INHALATION_SPRAY | Freq: Two times a day (BID) | RESPIRATORY_TRACT | 5 refills | Status: AC | PRN

## 2016-03-09 MED ORDER — ALBUTEROL SULFATE HFA 108 (90 BASE) MCG/ACT IN AERS: 2.0000 | INHALATION_SPRAY | Freq: Two times a day (BID) | RESPIRATORY_TRACT | 5 refills | Status: AC | PRN

## 2016-03-09 NOTE — Patient Instructions (Signed)
Continue on current regimen .  Follow up with our office As needed   

## 2016-03-09 NOTE — Assessment & Plan Note (Signed)
Continue current regimen. Follow-up primary care

## 2016-03-09 NOTE — Addendum Note (Signed)
Addended by: Osa Craver on: 03/09/2016 10:59 AM   Modules accepted: Orders

## 2016-03-09 NOTE — Progress Notes (Signed)
Subjective:    Patient ID: Monica Williamson, female    DOB: Nov 26, 1940, 75 y.o.   MRN: [id number]  HPI 75 yo female with bronchiectasis seen for initial pulmonary consult during hospitalization 02/28/16 for SIRS/Sepsis due to Arytenoiduits and nasopharyngitis   03/09/2016 Follow up : Post hospital follow up  Pt is here for a hospital follow up. Pt states breathing has improved since discharge and has no new complaints at this time.  Patient was admitted August 13 through 03/01/2016 for sepsis. Patient presented with respiratory distress and stridor. Patient had been on a [place] when she developed upper respiratory symptoms with sore throat. On arrival to the emergency room. Patient had significant upper airway stridor. She was admitted to pulmonary critical care service for epiglottitis. She was started on antibiotics, steroids and nebulized bronchodilators. Patient was seen by ENT. Laryngoscopy showed some swelling all along the arytenoids a slight amount of her area epiglottic fold and false cords, but her glottis only has a slight thickening of vocal cords. Cultures were negative. CT neck showed diffuse laryngeal edema with marked airway narrowing and a normal epiglottis. He was discharged on Augmentin. Patient says she is feeling much better. No residual sore throat. Does have some mild hoarseness. She denies any difficulty swallowing, chest pain, shortness of breath, orthopnea, PND, or increased leg swelling Has some mild itching on Augmentin and this was changed to Levaquin..  Past Medical History:  Diagnosis Date  . Anxiety   . Arthritis   . Atrial fibrillation ([place])    when takes Warfarin  . Atrial fibrillation, currently in sinus rhythm ([place])   . Bronchiectasis ([place])    contolled with lovent and allergy med  . Childhood asthma   . Cholelithiasis   . Colon polyps   . Complication of anesthesia   . Depression   . Diverticulosis   . Dysrhythmia    sees Dr Acie Fredrickson annually  for h/o Afib  . H/O bronchiectasis   . History of airborne allergies    uses inhalers  . Hypertension   . Nocturia   . Osteopenia   . PONV (postoperative nausea and vomiting)    severe n/v post anesthesia   . Pulmonary nodule   . Sleep related teeth grinding    wears a mouth guard at night  . Stress fracture of tibia 2014  . Vitamin D deficiency    Current Outpatient Prescriptions on File Prior to Visit  Medication Sig Dispense Refill  . acetaminophen (TYLENOL) 500 MG tablet Take 1,000 mg by mouth every 6 (six) hours as needed for moderate pain or headache.    . albuterol (PROVENTIL HFA;VENTOLIN HFA) 108 (90 BASE) MCG/ACT inhaler Inhale 2 puffs into the lungs 2 (two) times daily as needed for wheezing or shortness of breath.     . ALPRAZolam (XANAX) 0.5 MG tablet Take 0.25 mg by mouth at bedtime as needed for anxiety or sleep.     [name] buPROPion (WELLBUTRIN XL) 150 MG 24 hr tablet Take 150 mg by mouth every morning.  30 tablet 11  . Calcium Carbonate-Vit D-Min (CALCIUM 1200) 1200-1000 MG-UNIT CHEW Chew 1 tablet by mouth every morning.     . diltiazem (DILT-XR) 180 MG 24 hr capsule Take 1 capsule (180 mg total) by mouth daily. 90 capsule 3  . fluticasone (FLOVENT HFA) 110 MCG/ACT inhaler Inhale 2 puffs into the lungs 2 (two) times daily as needed (for shortness of breath).     . propranolol (INDERAL) 10 MG  tablet Take 1 tablet (10 mg total) by mouth 4 (four) times daily as needed (FOR PALPITATIONS Can take ONE every 30 minutes Up to 4 Doses). 30 tablet 0  . rivaroxaban (XARELTO) 20 MG TABS tablet Take 1 tablet (20 mg total) by mouth daily with supper. 90 tablet 1  . rosuvastatin (CRESTOR) 20 MG tablet Take 20 mg by mouth at bedtime.    . valsartan (DIOVAN) 160 MG tablet Take 160 mg by mouth daily.     No current facility-administered medications on file prior to visit.      Review of Systems Constitutional:   No  weight loss, night sweats,  Fevers, chills, fatigue, or   lassitude.  HEENT:   No headaches,  Difficulty swallowing,  Tooth/dental problems, or  Sore throat,                No sneezing, itching, ear ache, nasal congestion, post nasal drip,   CV:  No chest pain,  Orthopnea, PND, swelling in lower extremities, anasarca, dizziness, palpitations, syncope.   GI  No heartburn, indigestion, abdominal pain, nausea, vomiting, diarrhea, change in bowel habits, loss of appetite, bloody stools.   Resp: No shortness of breath with exertion or at rest.  No excess mucus, no productive cough,  No non-productive cough,  No coughing up of blood.  No change in color of mucus.  No wheezing.  No chest wall deformity  Skin: no rash or lesions.  GU: no dysuria, change in color of urine, no urgency or frequency.  No flank pain, no hematuria   MS:  No joint pain or swelling.  No decreased range of motion.  No back pain.  Psych:  No change in mood or affect. No depression or anxiety.  No memory loss.         Objective:   Physical Exam Vitals:   03/09/16 0940  BP: 130/60  Pulse: 70  Temp: 97.6 F (36.4 C)  TempSrc: Oral  SpO2: 97%  Weight: 146 lb (66.2 kg)  Height: 5\' 7"  (1.702 m)   GEN: A/Ox3; pleasant , NAD, well nourished    HEENT:  [place]/AT,  EACs-clear, TMs-wnl, NOSE-clear, THROAT-clear, no lesions, no postnasal drip or exudate noted.   NECK:  Supple w/ fair ROM; no JVD; normal carotid impulses w/o bruits; no thyromegaly or nodules palpated; no lymphadenopathy.    RESP  Clear  P & A; w/o, wheezes/ rales/ or rhonchi. no accessory muscle use, no dullness to percussion  CARD:  RRR, no m/r/g  , no peripheral edema, pulses intact, no cyanosis or clubbing.  GI:   Soft & nt; nml bowel sounds; no organomegaly or masses detected.   Musco: Warm bil, no deformities or joint swelling noted.   Neuro: alert, no focal deficits noted.    Skin: Warm, no lesions or rashes  Donte Kary NP-C  [facility]  03/09/2016         Assessment & Plan:

## 2016-03-09 NOTE — Assessment & Plan Note (Signed)
Upper airway stridor secondary to Arytenoiduitis nasopharyngitis.. This has resolved. Patient is to follow-up with pulmonary on as-needed basis

## 2016-03-14 DIAGNOSIS — K115 Sialolithiasis: Secondary | ICD-10-CM | POA: Diagnosis not present

## 2016-03-16 ENCOUNTER — Other Ambulatory Visit (HOSPITAL_COMMUNITY): Payer: Self-pay | Admitting: *Deleted

## 2016-03-17 ENCOUNTER — Encounter (HOSPITAL_COMMUNITY)
Admission: RE | Admit: 2016-03-17 | Discharge: 2016-03-17 | Disposition: A | Discharge status: Home / Self Care | Payer: Medicare Other | Source: Ambulatory Visit | Attending: Internal Medicine | Admitting: Internal Medicine

## 2016-03-17 DIAGNOSIS — M810 Age-related osteoporosis without current pathological fracture: Secondary | ICD-10-CM | POA: Diagnosis not present

## 2016-03-17 DIAGNOSIS — H4322 Crystalline deposits in vitreous body, left eye: Secondary | ICD-10-CM | POA: Diagnosis not present

## 2016-03-17 DIAGNOSIS — H2513 Age-related nuclear cataract, bilateral: Secondary | ICD-10-CM | POA: Diagnosis not present

## 2016-03-17 MED ORDER — DENOSUMAB 60 MG/ML ~~LOC~~ SOLN
60.0000 mg | Freq: Once | SUBCUTANEOUS | Status: AC
  Administered 2016-03-17: 60 mg via SUBCUTANEOUS
  Filled 2016-03-17: qty 1

## 2016-03-22 DIAGNOSIS — Z96652 Presence of left artificial knee joint: Secondary | ICD-10-CM | POA: Diagnosis not present

## 2016-03-22 DIAGNOSIS — Z471 Aftercare following joint replacement surgery: Secondary | ICD-10-CM | POA: Diagnosis not present

## 2016-03-22 DIAGNOSIS — Z96651 Presence of right artificial knee joint: Secondary | ICD-10-CM | POA: Diagnosis not present

## 2016-03-30 ENCOUNTER — Ambulatory Visit (INDEPENDENT_AMBULATORY_CARE_PROVIDER_SITE_OTHER): Payer: Medicare Other | Admitting: Internal Medicine

## 2016-03-30 ENCOUNTER — Encounter: Payer: Self-pay | Admitting: Internal Medicine

## 2016-03-30 VITALS — BP 122/58 | HR 85 | Ht 67.0 in | Wt 148.0 lb

## 2016-03-30 DIAGNOSIS — R053 Chronic cough: Secondary | ICD-10-CM

## 2016-03-30 DIAGNOSIS — R05 Cough: Secondary | ICD-10-CM | POA: Diagnosis not present

## 2016-03-30 DIAGNOSIS — I6529 Occlusion and stenosis of unspecified carotid artery: Secondary | ICD-10-CM | POA: Diagnosis not present

## 2016-03-30 DIAGNOSIS — Z8709 Personal history of other diseases of the respiratory system: Secondary | ICD-10-CM | POA: Diagnosis not present

## 2016-03-30 DIAGNOSIS — R0982 Postnasal drip: Secondary | ICD-10-CM | POA: Diagnosis not present
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
## 2016-03-31 ENCOUNTER — Telehealth: Payer: Self-pay | Admitting: Internal Medicine

## 2016-03-31 LAB — NITRIC OXIDE: NITRIC OXIDE: 21

## 2016-03-31 MED ORDER — FLUTICASONE PROPIONATE 50 MCG/ACT NA SUSP: 2.0000 | Freq: Every day | NASAL | 5 refills | Status: AC

## 2016-03-31 NOTE — Addendum Note (Signed)
Addended by: Collier Salina on: 03/31/2016 02:57 PM   Modules accepted: Orders

## 2016-03-31 NOTE — Telephone Encounter (Signed)
Spoke with pt. States that she was to have a prescription sent in for flonase. This was not done. Rx has been sent in. Nothing further was needed.

## 2016-04-16 DIAGNOSIS — Z23 Encounter for immunization: Secondary | ICD-10-CM | POA: Diagnosis not present

## 2016-05-24 ENCOUNTER — Encounter: Payer: Self-pay | Admitting: Internal Medicine

## 2016-05-24 ENCOUNTER — Ambulatory Visit (INDEPENDENT_AMBULATORY_CARE_PROVIDER_SITE_OTHER): Payer: Medicare Other | Admitting: Internal Medicine

## 2016-05-24 VITALS — BP 126/70 | HR 71 | Ht 67.0 in | Wt 149.0 lb

## 2016-05-24 DIAGNOSIS — R05 Cough: Secondary | ICD-10-CM | POA: Diagnosis not present

## 2016-05-24 DIAGNOSIS — I6529 Occlusion and stenosis of unspecified carotid artery: Secondary | ICD-10-CM

## 2016-05-24 DIAGNOSIS — Z8709 Personal history of other diseases of the respiratory system: Secondary | ICD-10-CM

## 2016-05-24 DIAGNOSIS — R053 Chronic cough: Secondary | ICD-10-CM

## 2016-05-24 DIAGNOSIS — R0982 Postnasal drip: Secondary | ICD-10-CM | POA: Diagnosis not present

## 2016-05-24 MED ORDER — FLUTICASONE PROPIONATE 50 MCG/ACT NA SUSP: 2.0000 | Freq: Every day | NASAL | 12 refills | Status: DC

## 2016-05-24 NOTE — Progress Notes (Signed)
Subjective:     Patient ID: Monica Williamson, female   DOB: April 02, 1941, 75 y.o.   MRN: [id number]  HPI   75 yo female with bronchiectasis seen for initial pulmonary consult during hospitalization 02/28/16 for SIRS/Sepsis due to Arytenoiduits and nasopharyngitis   03/09/2016 Follow up : Post hospital follow up  Pt is here for a hospital follow up. Pt states breathing has improved since discharge and has no new complaints at this time.  Patient was admitted August 13 through 03/01/2016 for sepsis. Patient presented with respiratory distress and stridor. Patient had been on a [place] cruise when she developed upper respiratory symptoms with sore throat. On arrival to the emergency room. Patient had significant upper airway stridor. She was admitted to pulmonary critical care service for epiglottitis. She was started on antibiotics, steroids and nebulized bronchodilators. Patient was seen by ENT. Laryngoscopy showed some swelling all along the arytenoids a slight amount of her area epiglottic fold and false cords, but her glottis only has a slight thickening of vocal cords. Cultures were negative. CT neck showed diffuse laryngeal edema with marked airway narrowing and a normal epiglottis. He was discharged on Augmentin. Patient says she is feeling much better. No residual sore throat. Does have some mild hoarseness. She denies any difficulty swallowing, chest pain, shortness of breath, orthopnea, PND, or increased leg swelling Has some mild itching on Augmentin and this was changed to Levaquin..   Ok Edwards 03/30/2016  Chief Complaint  Patient presents with  . Follow-up    Pt was to f/u as needed but wanted to come in because she has lingering cough. Pt c/o dry cough, lack in energy, SOB.    75 year old female follow-up arytenoidits from august 2017  She is here with her husband. This is because she has lingering cough and also fatigue since her admission. She says that the cough has improved. Cough is  associated with a lot of clearing of the throat. Cough feels as though it is coming from the throat. There is associated sinus drainage. She is on long-standing antihistamine as part of her bronchiectasis control. She is wondering if she needs to change the brand and rotate the brand. She's not had any nasal steroid. She denies any acid reflux. She also says she has history of asthma/bronchiectasis for which she's been on Flovent for many years which she does not take on a regular basis. Describes a history of bronchiectasis she says that she's been cough free with the antihistamine on the Flovent for many years. She is really worried about the cough because it has recurred and she is wondering whether it might be due to bronchiectasis. Exhaled nitric oxide today in the office this is 21 pbb and normal  Review of chart 2014 [facility] bronchiectasis evaluation by Dr. Cresenciano Lick: At that point he noticed that she had complete resolution of her cough. He was seeing her for a history of recurrent respiratory infections. In his note that he noticed that the recurrent respiratory infections are probably due to exacerbations of bronchiectasis. He reviewed a CT chest from 2008 and 2014 August and felt that might be chronic MAI infection but the sputum cultures were negative for MAI.[name] However given her a symptomatic standpoint at that point and normal pulmonary function test infection is not losing weight and expectant follow-up  Adopted  2014 August pulmonary function test at [facility] 2.8 L/120% predicted, FEV1 2.29 L/98% ratio 74. DLCO of 15.3/85%. Essentially normal pulmonary function test  2014 chest x-ray at [facility] reported as upper lobe reticular find changes possible bronchiectasis  2014 concentrated sputum AFB negative  2017 August: Chest x-ray is clear without any obvious evidence of bronchiectasis-personally visualized   OV 05/24/2016  Chief Complaint  Patient presents  with  . Follow-up    Pt states her cough has complete resolved. Pt denies SOB, CP/tightness and f/c/s.     Follow-up postviral reactive cough. In summer 2017 she had acute epiglottitis then subsequently late summer 2017 she had postviral reactive cough and I saw her in September 2017. This started more aggressive postnasal drip approach with adding inhaled nasal steroids aren't palpable oral antihistamines. I told her to stop her Flovent which she believes she was taking for her to his diagnosis of bronchiectasis according to her was largely stable and symptom-free. She now tells me that as soon as she started taking her Flonase or cough went away. She's not taking it every few days on a regular basis. She feels it helps immensely. She is completely symptom-free from a respiratory standpoint   has a past medical history of Anxiety; Arthritis; Atrial fibrillation ([place]); Atrial fibrillation, currently in sinus rhythm; Bronchiectasis ([place]); Childhood asthma; Cholelithiasis; Colon polyps; Complication of anesthesia; Depression; Diverticulosis; Dysrhythmia; H/O bronchiectasis; History of airborne allergies; Hypertension; Nocturia; Osteopenia; PONV (postoperative nausea and vomiting); Pulmonary nodule; Sleep related teeth grinding; Stress fracture of tibia (2014); and Vitamin D deficiency.   reports that she quit smoking about 37 years ago. She has a 15.00 pack-year smoking history. She has never used smokeless tobacco.  Past Surgical History:  Procedure Laterality Date  . BREAST SURGERY    . CHOLECYSTECTOMY     11/2010  . INCISION AND DRAINAGE BREAST ABSCESS    . JOINT REPLACEMENT Right 09/2013   knee  . MENISCUS REPAIR Right 2014  . 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 TOTAL KNEE ARTHROPLASTY Left 02/24/2014    Procedure: TOTAL KNEE ARTHROPLASTY;  Surgeon: Vickey Huger, MD;  Location: [place];  Service: Orthopedics;  Laterality: Left;  [name] VAGINAL HYSTERECTOMY    . VESICOVAGINAL FISTULA CLOSURE W/ TAH      Allergies  Allergen Reactions  . Warfarin And Related Hives and Itching  . Augmentin [Amoxicillin-Pot Clavulanate] Rash  . Avelox [Moxifloxacin] Diarrhea    Immunization History  Administered Date(s) Administered  . Influenza, High Dose Seasonal PF 03/18/2016  . Influenza,inj,Quad PF,36+ Mos 03/19/2015    Family History  Problem Relation Age of Onset  . Heart disease Sister   . Colon cancer Neg Hx      Current Outpatient Prescriptions:  .  acetaminophen (TYLENOL) 500 MG tablet, Take 1,000 mg by mouth every 6 (six) hours as needed for moderate pain or headache., Disp: , Rfl:  .  albuterol (PROVENTIL HFA;VENTOLIN HFA) 108 (90 Base) MCG/ACT inhaler, Inhale 2 puffs into the lungs 2 (two) times daily as needed for wheezing or shortness of breath., Disp: 1 Inhaler, Rfl: 5 .  ALPRAZolam (XANAX) 0.5 MG tablet, Take 0.25 mg by mouth at bedtime as needed for anxiety or sleep. , Disp: , Rfl:  .  benzonatate (TESSALON) 100 MG capsule, Take 1 capsule by mouth every 8 (eight) hours as needed., Disp: , Rfl:  .  buPROPion (WELLBUTRIN XL) 150 MG 24 hr tablet, Take 150 mg  by mouth every morning. , Disp: 30 tablet, Rfl: 11 .  Calcium Carbonate-Vit D-Min (CALCIUM 1200) 1200-1000 MG-UNIT CHEW, Chew 1 tablet by mouth every morning. , Disp: , Rfl:  .  diltiazem (DILT-XR) 180 MG 24 hr capsule, Take 1 capsule (180 mg total) by mouth daily., Disp: 90 capsule, Rfl: 3 .  fluticasone (FLONASE) 50 MCG/ACT nasal spray, Place 2 sprays into both nostrils daily., Disp: 16 g, Rfl: 5 .  fluticasone (FLOVENT HFA) 110 MCG/ACT inhaler, Inhale 2 puffs into the lungs 2 (two) times daily as needed (for shortness of breath)., Disp: 1 Inhaler, Rfl: 5 .  propranolol (INDERAL) 10 MG tablet, Take 1 tablet (10 mg total) by mouth 4 (four)  times daily as needed (FOR PALPITATIONS Can take ONE every 30 minutes Up to 4 Doses)., Disp: 30 tablet, Rfl: 0 .  rivaroxaban (XARELTO) 20 MG TABS tablet, Take 1 tablet (20 mg total) by mouth daily with supper., Disp: 90 tablet, Rfl: 1 .  rosuvastatin (CRESTOR) 20 MG tablet, Take 20 mg by mouth at bedtime., Disp: , Rfl:  .  valsartan (DIOVAN) 160 MG tablet, Take 160 mg by mouth daily., Disp: , Rfl:     Review of Systems     Objective:   Physical Exam  Constitutional: She is oriented to person, place, and time. She appears well-developed and well-nourished. No distress.  HENT:  Head: Normocephalic and atraumatic.  Right Ear: External ear normal.  Left Ear: External ear normal.  Mouth/Throat: Oropharynx is clear and moist. No oropharyngeal exudate.  Eyes: Conjunctivae and EOM are normal. Pupils are equal, round, and reactive to light. Right eye exhibits no discharge. Left eye exhibits no discharge. No scleral icterus.  Neck: Normal range of motion. Neck supple. No JVD present. No tracheal deviation present. No thyromegaly present.  Cardiovascular: Normal rate, regular rhythm, normal heart sounds and intact distal pulses.  Exam reveals no gallop and no friction rub.   No murmur heard. Pulmonary/Chest: Effort normal and breath sounds normal. No respiratory distress. She has no wheezes. She has no rales. She exhibits no tenderness.  Abdominal: Soft. Bowel sounds are normal. She exhibits no distension and no mass. There is no tenderness. There is no rebound and no guarding.  Musculoskeletal: Normal range of motion. She exhibits no edema or tenderness.  Lymphadenopathy:    She has no cervical adenopathy.  Neurological: She is alert and oriented to person, place, and time. She has normal reflexes. No cranial nerve deficit. She exhibits normal muscle tone. Coordination normal.  Skin: Skin is warm and dry. No rash noted. She is not diaphoretic. No erythema. No pallor.  Psychiatric: She has a  normal mood and affect. Her behavior is normal. Judgment and thought content normal.  Vitals reviewed.   Vitals:   05/24/16 0919  BP: 126/70  Pulse: 71  SpO2: 99%  Weight: 149 lb (67.6 kg)  Height: 5\' 7"  (1.702 m)   BMI    Body Mass Index:  23.34 kg/m         Assessment:       ICD-9-CM ICD-10-CM   1. Chronic cough 786.2 R05   2. Post-nasal drip 784.91 R09.82   3. History of bronchiectasis V12.69 Z87.09   4. History of acute epiglottitis V12.69 Z87.09    Cough is resolved    Plan:      Glad cough is resolved. It was  post infectious reactive cough made worse by chronic post nasal drip  PLAN -  OTC brand of  anti-histamine as desired -  take generic fluticasone inhaler 2 squirts each nostril daily or few times per week but on schedule  - will 12 refills    Followup  -as needed; bronchiectasis evaluation in future only depending on course   Dr. Brand Males, M.D., [facility].C.P Pulmonary and Critical Care Medicine Staff Physician [facility] Pager: [phone], If no answer or between  15:00h - 7:00h: call [phone]  05/24/2016 9:35 AM

## 2016-05-24 NOTE — Addendum Note (Signed)
Addended by: Benson Setting L on: 05/24/2016 09:51 AM   Modules accepted: Orders

## 2016-05-24 NOTE — Patient Instructions (Addendum)
ICD-9-CM ICD-10-CM   1. Chronic cough 786.2 R05   2. Post-nasal drip 784.91 R09.82   3. History of bronchiectasis V12.69 Z87.09   4. History of acute epiglottitis V12.69 Z87.09     Glad cough is resolved. It was  post infectious reactive cough made worse by chronic post nasal drip  PLAN -  OTC brand of anti-histamine as desired -  take generic fluticasone inhaler 2 squirts each nostril daily or few times per week but on schedule  - will 12 refills    Followup  -as needed; bronchiectasis evaluation in future only depending on course

## 2016-06-21 ENCOUNTER — Other Ambulatory Visit: Payer: Self-pay | Admitting: Internal Medicine

## 2016-06-21 DIAGNOSIS — Z1231 Encounter for screening mammogram for malignant neoplasm of breast: Secondary | ICD-10-CM

## 2016-07-05 ENCOUNTER — Other Ambulatory Visit: Payer: Self-pay | Admitting: Cardiovascular Disease

## 2016-07-25 ENCOUNTER — Ambulatory Visit
Admission: RE | Admit: 2016-07-25 | Discharge: 2016-07-25 | Disposition: A | Discharge status: Home / Self Care | Payer: Medicare Other | Source: Ambulatory Visit | Attending: Internal Medicine | Admitting: Internal Medicine

## 2016-07-25 DIAGNOSIS — Z1231 Encounter for screening mammogram for malignant neoplasm of breast: Secondary | ICD-10-CM

## 2016-08-04 ENCOUNTER — Ambulatory Visit: Payer: Medicare Other | Admitting: Internal Medicine

## 2016-08-11 ENCOUNTER — Other Ambulatory Visit: Payer: Self-pay | Admitting: Cardiovascular Disease

## 2016-08-15 ENCOUNTER — Ambulatory Visit (INDEPENDENT_AMBULATORY_CARE_PROVIDER_SITE_OTHER): Payer: Medicare Other | Admitting: Cardiovascular Disease

## 2016-08-15 ENCOUNTER — Encounter: Payer: Self-pay | Admitting: Cardiovascular Disease

## 2016-08-15 VITALS — BP 120/78 | HR 60 | Ht 67.0 in | Wt 146.0 lb

## 2016-08-15 DIAGNOSIS — I480 Paroxysmal atrial fibrillation: Secondary | ICD-10-CM | POA: Diagnosis not present

## 2016-08-15 DIAGNOSIS — I779 Disorder of arteries and arterioles, unspecified: Secondary | ICD-10-CM | POA: Insufficient documentation

## 2016-08-15 DIAGNOSIS — I6523 Occlusion and stenosis of bilateral carotid arteries: Secondary | ICD-10-CM

## 2016-08-15 DIAGNOSIS — I739 Peripheral vascular disease, unspecified: Secondary | ICD-10-CM

## 2016-08-15 MED ORDER — PROPRANOLOL HCL 10 MG PO TABS: 10.0000 mg | ORAL_TABLET | Freq: Four times a day (QID) | ORAL | 11 refills | Status: DC | PRN

## 2016-08-15 NOTE — Progress Notes (Signed)
Cardiology Office Note   Date:  08/15/2016   ID:  Monica Williamson, DOB January 21, 1941, MRN [id number]  PCP:  Marton Redwood, MD  Cardiologist:   Mertie Moores, MD   Chief Complaint  Patient presents with  . Atrial Fibrillation      History of Present Illness: Monica Williamson is a 76 y.o. female who presents for follow-up of her paroxysmal atrial fibrillation.  She has a CHADS VASC 2 score of 2 and is on Xarelto .   She has had both  knees replaced.  She has had only 2 episodes of PAF - resolved very quickly with the propranolol.   Doing well.   Jan. 27, 2017:  Jan. 29, 2018:  Monica Williamson is seen today for follow up of her PAF .  Has had 3 episodes of PAF in December.  Takes a propranolol with resolution ( 1-2 tabs )  Typically occurs at night ,  Lasts perhaps 30 minutes.      Past Medical History:  Diagnosis Date  . Anxiety   . Arthritis   . Atrial fibrillation ([place])    when takes Warfarin  . Atrial fibrillation, currently in sinus rhythm   . Bronchiectasis ([place])    contolled with lovent and allergy med  . Childhood asthma   . Cholelithiasis   . Colon polyps   . Complication of anesthesia   . Depression   . Diverticulosis   . Dysrhythmia    sees Dr Acie Fredrickson annually for h/o Afib  . H/O bronchiectasis   . History of airborne allergies    uses inhalers  . Hypertension   . Nocturia   . Osteopenia   . PONV (postoperative nausea and vomiting)    severe n/v post anesthesia   . Pulmonary nodule   . Sleep related teeth grinding    wears a mouth guard at night  . Stress fracture of tibia 2014  . Vitamin D deficiency     Past Surgical History:  Procedure Laterality Date  . BREAST SURGERY    . CHOLECYSTECTOMY     11/2010  . INCISION AND DRAINAGE BREAST ABSCESS    . JOINT REPLACEMENT Right 09/2013   knee  . MENISCUS REPAIR Right 2014  . 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 TOTAL KNEE ARTHROPLASTY Left 02/24/2014   Procedure: TOTAL KNEE ARTHROPLASTY;  Surgeon: Vickey Huger, MD;  Location: [place];  Service: Orthopedics;  Laterality: Left;  [name] VAGINAL HYSTERECTOMY    . VESICOVAGINAL FISTULA CLOSURE W/ TAH       Current Outpatient Prescriptions  Medication Sig Dispense Refill  . acetaminophen (TYLENOL) 500 MG tablet Take 1,000 mg by mouth every 6 (six) hours as needed for moderate pain or headache.    . albuterol (PROVENTIL HFA;VENTOLIN HFA) 108 (90 Base) MCG/ACT inhaler Inhale 2 puffs into the lungs 2 (two) times daily as needed for wheezing or shortness of breath. 1 Inhaler 5  . ALPRAZolam (XANAX) 0.5 MG tablet Take 0.25 mg by mouth at bedtime as needed for anxiety or sleep.     [name] atorvastatin (LIPITOR) 20 MG tablet every evening.    . benzonatate (TESSALON) 100 MG capsule Take 1 capsule by mouth every 8 (eight) hours as needed.    [name] buPROPion (WELLBUTRIN XL) 150 MG 24 hr tablet  Take 150 mg by mouth every morning.  30 tablet 11  . Calcium Carbonate-Vit D-Min (CALCIUM 1200) 1200-1000 MG-UNIT CHEW Chew 1 tablet by mouth every morning.     . diltiazem (DILT-XR) 180 MG 24 hr capsule Take 1 capsule (180 mg total) by mouth daily. 90 capsule 0  . fluticasone (FLONASE) 50 MCG/ACT nasal spray Place 2 sprays into both nostrils daily. 16 g 5  . fluticasone (FLOVENT HFA) 110 MCG/ACT inhaler Inhale 2 puffs into the lungs 2 (two) times daily as needed (for shortness of breath). 1 Inhaler 5  . propranolol (INDERAL) 10 MG tablet Take 1 tablet (10 mg total) by mouth 4 (four) times daily as needed (FOR PALPITATIONS Can take ONE every 30 minutes Up to 4 Doses). 30 tablet 0  . rivaroxaban (XARELTO) 20 MG TABS tablet Take 1 tablet (20 mg total) by mouth daily with supper. 90 tablet 0  . valsartan (DIOVAN) 160 MG tablet Take 160 mg by mouth daily.     No current  facility-administered medications for this visit.     Allergies:   Warfarin and related; Augmentin [amoxicillin-pot clavulanate]; and Avelox [moxifloxacin]    Social History:  The patient  reports that she quit smoking about 38 years ago. She has a 15.00 pack-year smoking history. She has never used smokeless tobacco. She reports that she does not use drugs.   Family History:  The patient's family history includes Heart disease in her sister.    ROS:  Please see the history of present illness.   Otherwise, review of systems are positive for none.   All other systems are reviewed and negative.    PHYSICAL EXAM: VS:  BP 120/78 (BP Location: Left Arm, Patient Position: Sitting, Cuff Size: Normal)   Pulse 60   Ht 5\' 7"  (1.702 m)   Wt 146 lb (66.2 kg)   BMI 22.87 kg/m  , BMI Body mass index is 22.87 kg/m. GEN: Well nourished, well developed, in no acute distress  HEENT: normal  Neck: no JVD, carotid bruits, or masses Cardiac: RRR; no murmurs, rubs, or gallops,no edema  Respiratory:  clear to auscultation bilaterally, normal work of breathing GI: soft, nontender, nondistended, + BS MS: no deformity or atrophy  Skin: warm and dry, no rash Neuro:  Strength and sensation are intact Psych: euthymic mood, full affect   EKG:  EKG is ordered today. The ekg ordered today demonstrates   Sinus brady at 54,   Occasional PVCs    Recent Labs: 02/28/2016: ALT 61 03/01/2016: BUN 16; Creatinine, Ser 0.77; Hemoglobin 11.2; Magnesium 2.2; Platelets 146; Potassium 3.8; Sodium 140    Lipid Panel No results found for: CHOL, TRIG, HDL, CHOLHDL, VLDL, LDLCALC, LDLDIRECT    Wt Readings from Last 3 Encounters:  08/15/16 146 lb (66.2 kg)  05/24/16 149 lb (67.6 kg)  03/30/16 148 lb (67.1 kg)      Other studies Reviewed:   ASSESSMENT AND PLAN:  1.  1. Paroxysmal atrial fibrillation  The patient remains in normal sinus rhythm. She's tolerating the Xarelto very well.  Continue current meds.    Will see her in 1 year.  Will refill Dilt.   2.   Hyperlipidemia:   On low dose atorvastatin. Managed by primary .   3. Carotid artery disease -  Has moderate Carotid disease  To have repeat duplex this July .   Current medicines are reviewed at length with the patient today.  The patient does not have concerns regarding medicines.  The following changes have been made:  no change  Labs/ tests ordered today include: ECG   No orders of the defined types were placed in this encounter.   Disposition:   FU with me  in 1 year.    Signed, Mertie Moores, MD  08/15/2016 9:36 AM    [facility] [address], [place], [place]  [address] Phone: [phone]; Fax: [phone]

## 2016-08-15 NOTE — Patient Instructions (Signed)

## 2016-08-25 ENCOUNTER — Other Ambulatory Visit (HOSPITAL_COMMUNITY): Payer: Self-pay | Admitting: *Deleted

## 2016-08-26 ENCOUNTER — Encounter (HOSPITAL_COMMUNITY)
Admission: RE | Admit: 2016-08-26 | Discharge: 2016-08-26 | Disposition: A | Discharge status: Home / Self Care | Payer: Medicare Other | Source: Ambulatory Visit | Attending: Internal Medicine | Admitting: Internal Medicine

## 2016-08-26 DIAGNOSIS — M810 Age-related osteoporosis without current pathological fracture: Secondary | ICD-10-CM | POA: Insufficient documentation

## 2016-08-26 MED ORDER — DENOSUMAB 60 MG/ML ~~LOC~~ SOLN
60.0000 mg | Freq: Once | SUBCUTANEOUS | Status: AC
  Administered 2016-08-26: 60 mg via SUBCUTANEOUS
  Filled 2016-08-26: qty 1

## 2016-08-30 ENCOUNTER — Ambulatory Visit (INDEPENDENT_AMBULATORY_CARE_PROVIDER_SITE_OTHER): Payer: Medicare Other | Admitting: Internal Medicine

## 2016-08-30 ENCOUNTER — Encounter: Payer: Self-pay | Admitting: Internal Medicine

## 2016-08-30 VITALS — BP 128/70 | HR 52 | Ht 67.0 in | Wt 148.0 lb

## 2016-08-30 DIAGNOSIS — I6523 Occlusion and stenosis of bilateral carotid arteries: Secondary | ICD-10-CM

## 2016-08-30 DIAGNOSIS — Z8709 Personal history of other diseases of the respiratory system: Secondary | ICD-10-CM

## 2016-08-30 DIAGNOSIS — J320 Chronic maxillary sinusitis: Secondary | ICD-10-CM

## 2016-08-30 NOTE — Progress Notes (Signed)
Subjective:     Patient ID: Monica Williamson, female   DOB: 1940/08/11, 76 y.o.   MRN: [id number]  HPI  76 yo female with bronchiectasis seen for initial pulmonary consult during hospitalization 02/28/16 for SIRS/Sepsis due to Arytenoiduits and nasopharyngitis   03/09/2016 Follow up : Post hospital follow up  Pt is here for a hospital follow up. Pt states breathing has improved since discharge and has no new complaints at this time.  Patient was admitted August 13 through 03/01/2016 for sepsis. Patient presented with respiratory distress and stridor. Patient had been on a [place] cruise when she developed upper respiratory symptoms with sore throat. On arrival to the emergency room. Patient had significant upper airway stridor. She was admitted to pulmonary critical care service for epiglottitis. She was started on antibiotics, steroids and nebulized bronchodilators. Patient was seen by ENT. Laryngoscopy showed some swelling all along the arytenoids a slight amount of her area epiglottic fold and false cords, but her glottis only has a slight thickening of vocal cords. Cultures were negative. CT neck showed diffuse laryngeal edema with marked airway narrowing and a normal epiglottis. He was discharged on Augmentin. Patient says she is feeling much better. No residual sore throat. Does have some mild hoarseness. She denies any difficulty swallowing, chest pain, shortness of breath, orthopnea, PND, or increased leg swelling Has some mild itching on Augmentin and this was changed to Levaquin..   Ok Edwards 03/30/2016  Chief Complaint  Patient presents with  . Follow-up    Pt was to f/u as needed but wanted to come in because she has lingering cough. Pt c/o dry cough, lack in energy, SOB.    76 year old female follow-up arytenoidits from august 2017  She is here with her husband. This is because she has lingering cough and also fatigue since her admission. She says that the cough has improved. Cough is  associated with a lot of clearing of the throat. Cough feels as though it is coming from the throat. There is associated sinus drainage. She is on long-standing antihistamine as part of her bronchiectasis control. She is wondering if she needs to change the brand and rotate the brand. She's not had any nasal steroid. She denies any acid reflux. She also says she has history of asthma/bronchiectasis for which she's been on Flovent for many years which she does not take on a regular basis. Describes a history of bronchiectasis she says that she's been cough free with the antihistamine on the Flovent for many years. She is really worried about the cough because it has recurred and she is wondering whether it might be due to bronchiectasis. Exhaled nitric oxide today in the office this is 21 pbb and normal  Review of chart 2014 [facility] bronchiectasis evaluation by Dr. Cresenciano Lick: At that point he noticed that she had complete resolution of her cough. He was seeing her for a history of recurrent respiratory infections. In his note that he noticed that the recurrent respiratory infections are probably due to exacerbations of bronchiectasis. He reviewed a CT chest from 2008 and 2014 August and felt that might be chronic MAI infection but the sputum cultures were negative for MAI.[name] However given her a symptomatic standpoint at that point and normal pulmonary function test infection is not losing weight and expectant follow-up  Adopted  2014 August pulmonary function test at [facility] 2.8 L/120% predicted, FEV1 2.29 L/98% ratio 74. DLCO of 15.3/85%. Essentially normal pulmonary function test  2014 chest x-ray at [facility] reported as upper lobe reticular find changes possible bronchiectasis  2014 concentrated sputum AFB negative  2017 August: Chest x-ray is clear without any obvious evidence of bronchiectasis-personally visualized   OV 05/24/2016  Chief Complaint  Patient presents  with  . Follow-up    Pt states her cough has complete resolved. Pt denies SOB, CP/tightness and f/c/s.     Follow-up postviral reactive cough. In summer 2017 she had acute epiglottitis then subsequently late summer 2017 she had postviral reactive cough and I saw her in September 2017. This started more aggressive postnasal drip approach with adding inhaled nasal steroids aren't palpable oral antihistamines. I told her to stop her Flovent which she believes she was taking for her to his diagnosis of bronchiectasis according to her was largely stable and symptom-free. She now tells me that as soon as she started taking her Flonase or cough went away. She's not taking it every few days on a regular basis. She feels it helps immensely. She is completely symptom-free from a respiratory standpoint   OV 08/30/2016  Chief Complaint  Patient presents with  . Follow-up    Pt states she went to her dentist in 05/2016 and was informed there was something seen on an xray and referred her to an ENT. Pt states she just wants to f/u with MR for routine f/u. Pt denies cough and SOB.    She had epiglottitis summer 2017 along with laryngitis. Then in November 2017 she saw a dentist for routine dental care and she had an x-ray that then resulted in a CT can of the sinus that showed maxillary sinusitis along with lesion  and possible airway narrowing of the oropharynx level. She made this appointment to discuss this because she thought I was an ENT physician. Currently denies any pulmonary problems   has a past medical history of Anxiety; Arthritis; Atrial fibrillation ([place]); Atrial fibrillation, currently in sinus rhythm; Bronchiectasis ([place]); Childhood asthma; Cholelithiasis; Colon polyps; Complication of anesthesia; Depression; Diverticulosis; Dysrhythmia; H/O bronchiectasis; History of airborne allergies; Hypertension; Nocturia; Osteopenia; PONV (postoperative nausea and vomiting); Pulmonary nodule; Sleep related  teeth grinding; Stress fracture of tibia (2014); and Vitamin D deficiency.   reports that she quit smoking about 38 years ago. She has a 15.00 pack-year smoking history. She has never used smokeless tobacco.  Past Surgical History:  Procedure Laterality Date  . BREAST SURGERY    . CHOLECYSTECTOMY     11/2010  . INCISION AND DRAINAGE BREAST ABSCESS    . JOINT REPLACEMENT Right 09/2013   knee  . MENISCUS REPAIR Right 2014  . 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   Procedure: RIGHT TOTAL KNEE ARTHROPLASTY;  Surgeon: Vickey Huger, MD;  Location: [place];  Service: Orthopedics;  Laterality: Right;  . 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 TOTAL KNEE ARTHROPLASTY Left 02/24/2014   Procedure: TOTAL KNEE ARTHROPLASTY;  Surgeon: Vickey Huger, MD;  Location: [place];  Service: Orthopedics;  Laterality: Left;  [name] VAGINAL HYSTERECTOMY    . VESICOVAGINAL FISTULA CLOSURE W/ TAH      Allergies  Allergen Reactions  . Warfarin And Related Hives and Itching  . Augmentin [Amoxicillin-Pot Clavulanate] Rash  . Avelox [Moxifloxacin] Diarrhea    Immunization History  Administered Date(s) Administered  . Influenza, High Dose Seasonal PF 03/18/2016  . Influenza,inj,Quad PF,36+ Mos 03/19/2015    Family History  Problem Relation Age of Onset  . Heart disease Sister   . Colon  cancer Neg Hx      Current Outpatient Prescriptions:  .  acetaminophen (TYLENOL) 500 MG tablet, Take 1,000 mg by mouth every 6 (six) hours as needed for moderate pain or headache., Disp: , Rfl:  .  albuterol (PROVENTIL HFA;VENTOLIN HFA) 108 (90 Base) MCG/ACT inhaler, Inhale 2 puffs into the lungs 2 (two) times daily as needed for wheezing or shortness of breath., Disp: 1 Inhaler, Rfl: 5 .  ALPRAZolam (XANAX) 0.5 MG tablet, Take 0.25 mg by mouth at bedtime as needed for anxiety or sleep. , Disp: , Rfl:  .  atorvastatin (LIPITOR) 20 MG tablet, every evening., Disp: , Rfl:  .  benzonatate (TESSALON) 100 MG capsule, Take 1  capsule by mouth every 8 (eight) hours as needed., Disp: , Rfl:  .  buPROPion (WELLBUTRIN XL) 150 MG 24 hr tablet, Take 150 mg by mouth every morning. , Disp: 30 tablet, Rfl: 11 .  Calcium Carbonate-Vit D-Min (CALCIUM 1200) 1200-1000 MG-UNIT CHEW, Chew 1 tablet by mouth every morning. , Disp: , Rfl:  .  diltiazem (DILT-XR) 180 MG 24 hr capsule, Take 1 capsule (180 mg total) by mouth daily., Disp: 90 capsule, Rfl: 0 .  fluticasone (FLONASE) 50 MCG/ACT nasal spray, Place 2 sprays into both nostrils daily., Disp: 16 g, Rfl: 5 .  fluticasone (FLOVENT HFA) 110 MCG/ACT inhaler, Inhale 2 puffs into the lungs 2 (two) times daily as needed (for shortness of breath)., Disp: 1 Inhaler, Rfl: 5 .  propranolol (INDERAL) 10 MG tablet, Take 1 tablet (10 mg total) by mouth 4 (four) times daily as needed (FOR PALPITATIONS Can take ONE every 30 minutes Up to 4 Doses)., Disp: 60 tablet, Rfl: 11 .  rivaroxaban (XARELTO) 20 MG TABS tablet, Take 1 tablet (20 mg total) by mouth daily with supper., Disp: 90 tablet, Rfl: 0 .  valsartan (DIOVAN) 160 MG tablet, Take 160 mg by mouth daily., Disp: , Rfl:    Review of Systems     Objective:   Physical Exam Vitals:   08/30/16 0915  BP: 128/70  Pulse: (!) 52  SpO2: 98%  Weight: 148 lb (67.1 kg)  Height: 5\' 7"  (1.702 m)   Estimated body mass index is 23.18 kg/m as calculated from the following:   Height as of this encounter: 5\' 7"  (1.702 m).   Weight as of this encounter: 148 lb (67.1 kg).  Discussion only visit. She is alert and oriented without any distress    Assessment:       ICD-9-CM ICD-10-CM   1. Maxillary sinusitis, unspecified chronicity 473.0 J32.0   2. History of acute epiglottitis V12.69 Z87.09        Plan:     Refer ENT    Dr. Brand Males, M.D., [facility].C.P Pulmonary and Critical Care Medicine Staff Physician [facility] Pager: [phone], If no answer or between  15:00h - 7:00h: call [phone]  08/30/2016 9:54 AM

## 2016-08-30 NOTE — Patient Instructions (Signed)
ICD-9-CM ICD-10-CM   1. Maxillary sinusitis, unspecified chronicity 473.0 J32.0   2. History of acute epiglottitis V12.69 Z87.09     Refer [facility] - Dr Wilburn Cornelia, [facility] or Dr Salli Quarry

## 2016-09-01 DIAGNOSIS — J320 Chronic maxillary sinusitis: Secondary | ICD-10-CM | POA: Diagnosis not present

## 2016-09-06 DIAGNOSIS — L820 Inflamed seborrheic keratosis: Secondary | ICD-10-CM | POA: Diagnosis not present

## 2016-10-05 ENCOUNTER — Other Ambulatory Visit: Payer: Self-pay | Admitting: Cardiovascular Disease

## 2016-11-10 ENCOUNTER — Other Ambulatory Visit: Payer: Self-pay | Admitting: Cardiovascular Disease

## 2016-11-21 DIAGNOSIS — J320 Chronic maxillary sinusitis: Secondary | ICD-10-CM | POA: Diagnosis not present

## 2016-11-30 DIAGNOSIS — E559 Vitamin D deficiency, unspecified: Secondary | ICD-10-CM | POA: Diagnosis not present

## 2016-11-30 DIAGNOSIS — I10 Essential (primary) hypertension: Secondary | ICD-10-CM | POA: Diagnosis not present

## 2016-12-07 DIAGNOSIS — I6523 Occlusion and stenosis of bilateral carotid arteries: Secondary | ICD-10-CM | POA: Diagnosis not present

## 2016-12-07 DIAGNOSIS — Z1389 Encounter for screening for other disorder: Secondary | ICD-10-CM | POA: Diagnosis not present

## 2016-12-07 DIAGNOSIS — Z0000 Encounter for general adult medical examination without abnormal findings: Secondary | ICD-10-CM | POA: Diagnosis not present

## 2016-12-07 DIAGNOSIS — Z7901 Long term (current) use of anticoagulants: Secondary | ICD-10-CM | POA: Diagnosis not present

## 2016-12-07 DIAGNOSIS — Z6823 Body mass index (BMI) 23.0-23.9, adult: Secondary | ICD-10-CM | POA: Diagnosis not present

## 2016-12-07 DIAGNOSIS — E559 Vitamin D deficiency, unspecified: Secondary | ICD-10-CM | POA: Diagnosis not present

## 2016-12-07 DIAGNOSIS — M859 Disorder of bone density and structure, unspecified: Secondary | ICD-10-CM | POA: Diagnosis not present

## 2016-12-07 DIAGNOSIS — N182 Chronic kidney disease, stage 2 (mild): Secondary | ICD-10-CM | POA: Diagnosis not present

## 2016-12-07 DIAGNOSIS — I480 Paroxysmal atrial fibrillation: Secondary | ICD-10-CM | POA: Diagnosis not present

## 2016-12-07 DIAGNOSIS — E784 Other hyperlipidemia: Secondary | ICD-10-CM | POA: Diagnosis not present

## 2016-12-07 DIAGNOSIS — J45909 Unspecified asthma, uncomplicated: Secondary | ICD-10-CM | POA: Diagnosis not present

## 2016-12-07 DIAGNOSIS — I10 Essential (primary) hypertension: Secondary | ICD-10-CM | POA: Diagnosis not present

## 2016-12-26 DIAGNOSIS — M859 Disorder of bone density and structure, unspecified: Secondary | ICD-10-CM | POA: Diagnosis not present

## 2016-12-27 DIAGNOSIS — L570 Actinic keratosis: Secondary | ICD-10-CM | POA: Diagnosis not present

## 2016-12-27 DIAGNOSIS — D1801 Hemangioma of skin and subcutaneous tissue: Secondary | ICD-10-CM | POA: Diagnosis not present

## 2016-12-27 DIAGNOSIS — L821 Other seborrheic keratosis: Secondary | ICD-10-CM | POA: Diagnosis not present

## 2017-01-19 ENCOUNTER — Telehealth: Payer: Self-pay | Admitting: Cardiovascular Disease

## 2017-01-19 NOTE — Telephone Encounter (Signed)
Pt states that she goes in and out of Afib.  Lately doesn't feel the "pounding" like she usually does when she's in Afib.  Checked HR last night and it was 117.  Pt was asymptomatic.  Took a Propanolol and went right to bed.  Feels fine today and feels like she is back in rhythm.  Pt currently scheduled to see Dr. Acie Fredrickson in late August.  Advised pt I would send message to Dr Acie Fredrickson and would call back if he had any recommendations or felt that she needed to be seen sooner. Pt appreciative for call.

## 2017-01-19 NOTE — Telephone Encounter (Signed)
Patient calling, states that she goes into AFIB time to time but takes medication for that. Patient states that this time her heart appears to be in rhythm but she has a rapid pulse rate of 115 for an example. Patient is scheduled to see Dr.Nahser in 03-15-17 and declined appt with APP.

## 2017-01-19 NOTE — Telephone Encounter (Signed)
She may continue to take the propanolol as needed  Call us back if the HR stays elevated for a prolonged time

## 2017-01-20 NOTE — Telephone Encounter (Signed)
Informed patient she may continue to take propanolol as needed. She understands to call if HR stays elevated for extended time. She was grateful for call and agrees with treatment plan.

## 2017-01-23 DIAGNOSIS — Z6823 Body mass index (BMI) 23.0-23.9, adult: Secondary | ICD-10-CM | POA: Diagnosis not present

## 2017-01-23 DIAGNOSIS — I10 Essential (primary) hypertension: Secondary | ICD-10-CM | POA: Diagnosis not present

## 2017-02-13 ENCOUNTER — Other Ambulatory Visit (HOSPITAL_COMMUNITY): Payer: Self-pay | Admitting: Internal Medicine

## 2017-02-13 DIAGNOSIS — I6523 Occlusion and stenosis of bilateral carotid arteries: Secondary | ICD-10-CM

## 2017-02-14 ENCOUNTER — Ambulatory Visit (HOSPITAL_COMMUNITY)
Admission: RE | Admit: 2017-02-14 | Discharge: 2017-02-14 | Disposition: A | Discharge status: Home / Self Care | Payer: Medicare Other | Source: Ambulatory Visit | Attending: Vascular Surgery | Admitting: Vascular Surgery

## 2017-02-14 DIAGNOSIS — I6523 Occlusion and stenosis of bilateral carotid arteries: Secondary | ICD-10-CM | POA: Diagnosis not present

## 2017-02-14 LAB — VAS US CAROTID
LCCADSYS: 67 cm/s
LCCAPDIAS: 17 cm/s
LEFT ECA DIAS: 13 cm/s
Left CCA dist dias: 18 cm/s
Left CCA prox sys: 84 cm/s
Left ICA dist dias: -32 cm/s
Left ICA dist sys: -107 cm/s
Left ICA prox dias: -39 cm/s
Left ICA prox sys: -153 cm/s
RCCADSYS: -79 cm/s
RCCAPDIAS: 10 cm/s
RIGHT CCA MID DIAS: 12 cm/s
RIGHT ECA DIAS: -20 cm/s
Right CCA prox sys: 64 cm/s

## 2017-02-15 DIAGNOSIS — Z6824 Body mass index (BMI) 24.0-24.9, adult: Secondary | ICD-10-CM | POA: Diagnosis not present

## 2017-02-15 DIAGNOSIS — J029 Acute pharyngitis, unspecified: Secondary | ICD-10-CM | POA: Diagnosis not present

## 2017-02-15 DIAGNOSIS — J45909 Unspecified asthma, uncomplicated: Secondary | ICD-10-CM | POA: Diagnosis not present

## 2017-02-21 ENCOUNTER — Encounter: Payer: Self-pay | Admitting: Vascular Surgery

## 2017-02-27 ENCOUNTER — Encounter: Payer: Self-pay | Admitting: *Deleted

## 2017-02-28 ENCOUNTER — Encounter (HOSPITAL_COMMUNITY): Payer: Medicare Other

## 2017-03-01 ENCOUNTER — Encounter: Payer: Self-pay | Admitting: Vascular Surgery

## 2017-03-01 ENCOUNTER — Ambulatory Visit (INDEPENDENT_AMBULATORY_CARE_PROVIDER_SITE_OTHER): Payer: Medicare Other | Admitting: Vascular Surgery

## 2017-03-01 VITALS — BP 110/57 | HR 63 | Temp 97.3°F | Resp 16 | Ht 65.75 in | Wt 149.2 lb

## 2017-03-01 DIAGNOSIS — I779 Disorder of arteries and arterioles, unspecified: Secondary | ICD-10-CM | POA: Diagnosis not present

## 2017-03-01 DIAGNOSIS — I6523 Occlusion and stenosis of bilateral carotid arteries: Secondary | ICD-10-CM

## 2017-03-01 DIAGNOSIS — I739 Peripheral vascular disease, unspecified: Principal | ICD-10-CM

## 2017-03-01 NOTE — Addendum Note (Signed)
Addended by: Lianne Cure A on: 03/01/2017 03:47 PM   Modules accepted: Orders

## 2017-03-01 NOTE — Progress Notes (Signed)
New Carotid Patient  Requested by:  Marton Redwood, MD [address] [place], [place] [address]  Reason for consultation: right carotid stenosis   History of Present Illness   Monica Williamson is a 76 y.o. (04/25/1941) female who presents with chief complaint: abnormal carotid study.  Patient has never had TIA or CVA sx.  She had a R carotid bruit found on physical exam and has been get B carotid duplexes subsequently.  Recent carotid studies demonstrated: RICA 54-56% stenosis, LICA 25-63% stenosis.  The patient has never had amaurosis fugax or monocular blindness.  The patient has never had facial drooping or hemiplegia.  The patient has never had receptive or expressive aphasia.     The patient's risks factors for carotid disease include: HTN, prior smoker.  Past Medical History:  Diagnosis Date  . Anxiety   . Arthritis   . Atrial fibrillation ([place])    when takes Warfarin  . Atrial fibrillation, currently in sinus rhythm   . Bronchiectasis ([place])    contolled with lovent and allergy med  . Childhood asthma   . Cholelithiasis   . Colon polyps   . Complication of anesthesia   . Depression   . Diverticulosis   . Dysrhythmia    sees Dr Acie Fredrickson annually for h/o Afib  . H/O bronchiectasis   . History of airborne allergies    uses inhalers  . Hypertension   . Nocturia   . Osteopenia   . PONV (postoperative nausea and vomiting)    severe n/v post anesthesia   . Pulmonary nodule   . Sleep related teeth grinding    wears a mouth guard at night  . Stress fracture of tibia 2014  . Vitamin D deficiency     Past Surgical History:  Procedure Laterality Date  . BREAST SURGERY    . CHOLECYSTECTOMY     11/2010  . INCISION AND DRAINAGE BREAST ABSCESS    . JOINT REPLACEMENT Right 09/2013   knee  . MENISCUS REPAIR Right 2014  . 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   Procedure: RIGHT TOTAL KNEE ARTHROPLASTY;  Surgeon: Vickey Huger, MD;  Location: [place];  Service:  Orthopedics;  Laterality: Right;  . 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 TOTAL KNEE ARTHROPLASTY Left 02/24/2014   Procedure: TOTAL KNEE ARTHROPLASTY;  Surgeon: Vickey Huger, MD;  Location: [place];  Service: Orthopedics;  Laterality: Left;  [name] VAGINAL HYSTERECTOMY    . VESICOVAGINAL FISTULA CLOSURE W/ TAH      Social History   Social History  . Marital status: Married    Spouse name: N/A  . Number of children: 3  . Years of education: N/A   Occupational History  . Retired    Social History Main Topics  . Smoking status: Former Smoker    Packs/day: 0.75    Years: 20.00    Quit date: 07/18/1978  . Smokeless tobacco: Never Used  . Alcohol use Not on file     Comment: daily cocktail  . Drug use: No  . Sexual activity: Yes    Birth control/ protection: Post-menopausal   Other Topics Concern  . Not on file   Social History Narrative  . No narrative on file    Family History  Problem Relation Age of Onset  . Heart disease Sister   . Colon cancer Neg Hx     Current Outpatient Prescriptions  Medication Sig Dispense Refill  . acetaminophen (TYLENOL) 500 MG tablet  Take 1,000 mg by mouth every 6 (six) hours as needed for moderate pain or headache.    . albuterol (PROVENTIL HFA;VENTOLIN HFA) 108 (90 Base) MCG/ACT inhaler Inhale 2 puffs into the lungs 2 (two) times daily as needed for wheezing or shortness of breath. 1 Inhaler 5  . ALPRAZolam (XANAX) 0.5 MG tablet Take 0.25 mg by mouth at bedtime as needed for anxiety or sleep.     [name] atorvastatin (LIPITOR) 20 MG tablet every evening.    . benzonatate (TESSALON) 100 MG capsule Take 1 capsule by mouth every 8 (eight) hours as needed.    [name] buPROPion (WELLBUTRIN XL) 150 MG 24 hr tablet Take 150 mg by mouth every morning.  30 tablet 11  . Calcium Carbonate-Vit D-Min (CALCIUM 1200) 1200-1000 MG-UNIT CHEW Chew 1 tablet by mouth every morning.     [name] DILT-XR 180 MG 24 hr capsule TAKE 1 CAPSULE DAILY 90 capsule 3  .  fluticasone (FLONASE) 50 MCG/ACT nasal spray Place 2 sprays into both nostrils daily. 16 g 5  . fluticasone (FLOVENT HFA) 110 MCG/ACT inhaler Inhale 2 puffs into the lungs 2 (two) times daily as needed (for shortness of breath). 1 Inhaler 5  . propranolol (INDERAL) 10 MG tablet Take 1 tablet (10 mg total) by mouth 4 (four) times daily as needed (FOR PALPITATIONS Can take ONE every 30 minutes Up to 4 Doses). 60 tablet 11  . valsartan (DIOVAN) 160 MG tablet Take 160 mg by mouth daily.    Alveda Reasons 20 MG TABS tablet TAKE 1 TABLET DAILY WITH SUPPER 90 tablet 1   No current facility-administered medications for this visit.     Allergies  Allergen Reactions  . Warfarin And Related Hives and Itching  . Augmentin [Amoxicillin-Pot Clavulanate] Rash  . Avelox [Moxifloxacin] Diarrhea    REVIEW OF SYSTEMS (negative unless checked):   Cardiac:  []  Chest pain or chest pressure? []  Shortness of breath upon activity? []  Shortness of breath when lying flat? [x]  Irregular heart rhythm? (intermittent afib)  Vascular:  []  Pain in calf, thigh, or hip brought on by walking? []  Pain in feet at night that wakes you up from your sleep? []  Blood clot in your veins? []  Leg swelling?  Pulmonary:  []  Oxygen at home? []  Productive cough? []  Wheezing?  Neurologic:  []  Sudden weakness in arms or legs? []  Sudden numbness in arms or legs? []  Sudden onset of difficult speaking or slurred speech? []  Temporary loss of vision in one eye? []  Problems with dizziness?  Gastrointestinal:  []  Blood in stool? []  Vomited blood?  Genitourinary:  []  Burning when urinating? []  Blood in urine?  Psychiatric:  []  Major depression  Hematologic:  []  Bleeding problems? []  Problems with blood clotting?  Dermatologic:  []  Rashes or ulcers?  Constitutional:  []  Fever or chills?  Ear/Nose/Throat:  []  Change in hearing? []  Nose bleeds? []  Sore throat?  Musculoskeletal:  []  Back pain? []  Joint pain? []   Muscle pain?   For VQI Use Only   PRE-ADM LIVING Home  AMB STATUS Ambulatory  CAD Sx None  PRIOR CHF None  STRESS TEST No    Physical Examination     Vitals:   03/01/17 1327  BP: (!) 110/57  Pulse: 63  Resp: 16  Temp: (!) 97.3 F (36.3 C)  TempSrc: Oral  SpO2: 98%  Weight: 149 lb 3 oz (67.7 kg)  Height: 5' 5.75" (1.67 m)   Body mass index is 24.26 kg/m.  General Alert,  O x 3, WD, NAD  Head [place]/AT,    Ear/Nose/ Throat Hearing grossly intact, nares without erythema or drainage, oropharynx without Erythema or Exudate, Mallampati score: 3,   Eyes PERRLA, EOMI,    Neck Supple, mid-line trachea,    Pulmonary Sym exp, good B air movt, CTA B  Cardiac RRR, Nl S1, S2, no Murmurs, No rubs, No S3,S4  Vascular Vessel Right Left  Radial Palpable Palpable  Brachial Palpable Palpable  Carotid Palpable, No Bruit Palpable, No Bruit  Aorta Not palpable N/A  Femoral Palpable Palpable  Popliteal Not palpable Not palpable  PT Palpable Palpable  DP Palpable Palpable    Gastro- intestinal soft, non-distended, non-tender to palpation, No guarding or rebound, no HSM, no masses, no CVAT B, No palpable prominent aortic pulse,    Musculo- skeletal M/S 5/5 throughout  , Extremities without ischemic changes  , No edema present, No visible varicosities , No Lipodermatosclerosis present, healed bilateral incisions in legs  Neurologic Cranial nerves 2-12 intact , Pain and light touch intact in extremities , Motor exam as listed above  Psychiatric Judgement intact, Mood & affect appropriate for pt's clinical situation  Dermatologic See M/S exam for extremity exam, No rashes otherwise noted  Lymphatic  Palpable lymph nodes: None     Non-invasive Vascular Imaging   B Carotid Duplex (02/14/17):  R ICA stenosis:  80-99% (ICA/CCA 5.0) R VA: patent and antegrade L ICA stenosis:  40-59% L VA: patent and antegrade   Medical Decision Making   MANE CONSOLO is a 76 y.o. female who  presents with:  asx R ICA stenosis >70%, asx L ICA stenosis 40-59%   Based on pt's description, it appears that her recent B carotid duplex was possibly inaccurate as the patient was actively coughing during the study.  Based on the patient's vascular studies and examination, I have offered the patient: CTA Neck.  The CTA should give [place] degree of stenosis and anatomic information on any lesions in her carotid arteries.  This study will be scheduled this coming week at the patient's convenience.  I will have her follow up in 2-4 weeks to discuss the findings in depth. I discussed in depth with the patient the nature of atherosclerosis, and emphasized the importance of maximal medical management including strict control of blood pressure, blood glucose, and lipid levels, obtaining regular exercise, antiplatelet agents, and cessation of smoking.   The patient is currently on a statin: Lipitor.  The patient is currently not on an anti-platelet due to bleeding risks related to use of an anticoagulant.  The patient is on warfarin for atrial fibrillation  The patient is aware that without maximal medical management the underlying atherosclerotic disease process will progress, limiting the benefit of any interventions.  Thank you for allowing [place] to participate in this patient's care.   Adele Barthel, MD, FACS [facility] Office: [phone] Pager: [phone]  03/01/2017, 1:09 PM

## 2017-03-03 ENCOUNTER — Ambulatory Visit
Admission: RE | Admit: 2017-03-03 | Discharge: 2017-03-03 | Disposition: A | Discharge status: Home / Self Care | Payer: Medicare Other | Source: Ambulatory Visit | Attending: Vascular Surgery | Admitting: Vascular Surgery

## 2017-03-03 DIAGNOSIS — I779 Disorder of arteries and arterioles, unspecified: Secondary | ICD-10-CM

## 2017-03-03 DIAGNOSIS — I6523 Occlusion and stenosis of bilateral carotid arteries: Secondary | ICD-10-CM | POA: Diagnosis not present

## 2017-03-03 DIAGNOSIS — I739 Peripheral vascular disease, unspecified: Principal | ICD-10-CM

## 2017-03-03 MED ORDER — IOPAMIDOL (ISOVUE-370) INJECTION 76%
80.0000 mL | Freq: Once | INTRAVENOUS | Status: AC | PRN
  Administered 2017-03-03: 80 mL via INTRAVENOUS

## 2017-03-07 ENCOUNTER — Telehealth: Payer: Self-pay | Admitting: Vascular Surgery

## 2017-03-07 ENCOUNTER — Other Ambulatory Visit: Payer: Self-pay

## 2017-03-07 ENCOUNTER — Other Ambulatory Visit (HOSPITAL_COMMUNITY): Payer: Self-pay | Admitting: *Deleted

## 2017-03-07 DIAGNOSIS — I779 Disorder of arteries and arterioles, unspecified: Secondary | ICD-10-CM

## 2017-03-07 DIAGNOSIS — I739 Peripheral vascular disease, unspecified: Principal | ICD-10-CM

## 2017-03-07 NOTE — Telephone Encounter (Signed)
   TELEPHONE CONSULT   Called pt to discuss results of recent CTA Neck (03/03/17):   60% diameter stenosis proximal right internal carotid artery due to atherosclerotic disease  40% diameter stenosis proximal left internal carotid artery due to atherosclerotic disease  No significant vertebral artery stenosis.  Based on my review of the patient's CTA, the patient has an area of smooth sclerotic disease in the R proximal ICA that measures between 50-60% by NASCET criteria.  - I discussed again with the patient that the R carotid duplex is dependent upon the human operator, so there is a higher rate of error with such. - Would continue with maximal medical mgmt of this patient's B carotid disease - Follow up in 6 month for repeat B carotid duplex - Per pt's wishes, will cancel her 85 AUG 18 appointment   Adele Barthel, MD, [place] [facility] Office: [phone] Pager: [phone]  03/07/2017, 9:56 AM

## 2017-03-08 ENCOUNTER — Encounter (HOSPITAL_COMMUNITY)
Admission: RE | Admit: 2017-03-08 | Discharge: 2017-03-08 | Disposition: A | Discharge status: Home / Self Care | Payer: Medicare Other | Source: Ambulatory Visit | Attending: Internal Medicine | Admitting: Internal Medicine

## 2017-03-08 DIAGNOSIS — M810 Age-related osteoporosis without current pathological fracture: Secondary | ICD-10-CM | POA: Insufficient documentation

## 2017-03-08 MED ORDER — DENOSUMAB 60 MG/ML ~~LOC~~ SOLN
60.0000 mg | Freq: Once | SUBCUTANEOUS | Status: AC
  Administered 2017-03-08: 60 mg via SUBCUTANEOUS
  Filled 2017-03-08: qty 1

## 2017-03-15 ENCOUNTER — Ambulatory Visit (INDEPENDENT_AMBULATORY_CARE_PROVIDER_SITE_OTHER): Payer: Medicare Other | Admitting: Cardiovascular Disease

## 2017-03-15 ENCOUNTER — Ambulatory Visit: Payer: Medicare Other | Admitting: Vascular Surgery

## 2017-03-15 ENCOUNTER — Encounter: Payer: Self-pay | Admitting: Cardiovascular Disease

## 2017-03-15 VITALS — BP 124/58 | HR 64 | Ht 67.0 in | Wt 148.8 lb

## 2017-03-15 DIAGNOSIS — I480 Paroxysmal atrial fibrillation: Secondary | ICD-10-CM | POA: Diagnosis not present

## 2017-03-15 DIAGNOSIS — I6523 Occlusion and stenosis of bilateral carotid arteries: Secondary | ICD-10-CM

## 2017-03-15 DIAGNOSIS — I779 Disorder of arteries and arterioles, unspecified: Secondary | ICD-10-CM

## 2017-03-15 DIAGNOSIS — I739 Peripheral vascular disease, unspecified: Secondary | ICD-10-CM

## 2017-03-15 NOTE — Progress Notes (Signed)
Cardiology Office Note   Date:  03/15/2017   ID:  Asa, Fath 11-Nov-1940, MRN [id number]  PCP:  Marton Redwood, MD  Cardiologist:   Mertie Moores, MD   Chief Complaint  Patient presents with  . Follow-up    a-fib      History of Present Illness: Monica Williamson is a 76 y.o. female who presents for follow-up of her paroxysmal atrial fibrillation.  She has a CHADS VASC 2 score of 2 and is on Xarelto .   She has had both  knees replaced.  She has had only 2 episodes of PAF - resolved very quickly with the propranolol.   Doing well.   Jan. 27, 2017:  Jan. 29, 2018:  Monica Williamson is seen today for follow up of her PAF .  Has had 3 episodes of PAF in December.  Takes a propranolol with resolution ( 1-2 tabs )  Typically occurs at night ,  Lasts perhaps 30 minutes.   Aug. 29, 2018:  Monica Williamson is doing well Has had several episodes of paroxysmal atrial fib.  Doing well Leaving for [place] in 2 weeks.     Past Medical History:  Diagnosis Date  . Anxiety   . Arthritis   . Atrial fibrillation ([place])    when takes Warfarin  . Atrial fibrillation, currently in sinus rhythm   . Bronchiectasis ([place])    contolled with lovent and allergy med  . Childhood asthma   . Cholelithiasis   . Colon polyps   . Complication of anesthesia   . Depression   . Diverticulosis   . Dysrhythmia    sees Dr Acie Fredrickson annually for h/o Afib  . H/O bronchiectasis   . History of airborne allergies    uses inhalers  . Hypertension   . Nocturia   . Osteopenia   . PONV (postoperative nausea and vomiting)    severe n/v post anesthesia   . Pulmonary nodule   . Sleep related teeth grinding    wears a mouth guard at night  . Stress fracture of tibia 2014  . Vitamin D deficiency     Past Surgical History:  Procedure Laterality Date  . BREAST SURGERY    . CHOLECYSTECTOMY     11/2010  . INCISION AND DRAINAGE BREAST ABSCESS    . JOINT REPLACEMENT Right 09/2013   knee  . MENISCUS  REPAIR Right 2014  . 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   Procedure: RIGHT TOTAL KNEE ARTHROPLASTY;  Surgeon: Vickey Huger, MD;  Location: [place];  Service: Orthopedics;  Laterality: Right;  . 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 TOTAL KNEE ARTHROPLASTY Left 02/24/2014   Procedure: TOTAL KNEE ARTHROPLASTY;  Surgeon: Vickey Huger, MD;  Location: [place];  Service: Orthopedics;  Laterality: Left;  [name] VAGINAL HYSTERECTOMY    . VESICOVAGINAL FISTULA CLOSURE W/ TAH       Current Outpatient Prescriptions  Medication Sig Dispense Refill  . acetaminophen (TYLENOL) 500 MG tablet Take 1,000 mg by mouth every 6 (six) hours as needed for moderate pain or headache.    . albuterol (PROVENTIL HFA;VENTOLIN HFA) 108 (90 Base) MCG/ACT inhaler Inhale 2 puffs into the lungs 2 (two) times daily as needed for wheezing or shortness of breath. 1 Inhaler 5  . ALPRAZolam (XANAX) 0.5 MG tablet Take 0.25 mg by mouth at bedtime as needed for anxiety or sleep.     [name] atorvastatin (LIPITOR) 40 MG tablet Take 40 mg by mouth  daily at 6 PM.     . benzonatate (TESSALON) 100 MG capsule Take 1 capsule by mouth every 8 (eight) hours as needed for cough.     [name] buPROPion (WELLBUTRIN XL) 150 MG 24 hr tablet Take 150 mg by mouth every morning.  30 tablet 11  . Calcium Carbonate-Vit D-Min (CALCIUM 1200) 1200-1000 MG-UNIT CHEW Chew 1 tablet by mouth every morning.     . diltiazem (DILACOR XR) 180 MG 24 hr capsule Take 180 mg by mouth daily.    . fluticasone (FLONASE) 50 MCG/ACT nasal spray Place 2 sprays into both nostrils daily. 16 g 5  . fluticasone (FLOVENT HFA) 110 MCG/ACT inhaler Inhale 2 puffs into the lungs 2 (two) times daily as needed (for shortness of breath). 1 Inhaler 5  . propranolol (INDERAL) 10 MG tablet Take 1 tablet (10 mg total) by mouth 4 (four) times daily as needed (FOR PALPITATIONS Can take ONE every 30 minutes Up to 4 Doses). 60 tablet 11  . rivaroxaban (XARELTO) 20 MG TABS tablet Take 20 mg by  mouth daily with supper.    . valsartan-hydrochlorothiazide (DIOVAN-HCT) 320-12.5 MG tablet Take 1 tablet by mouth daily.      No current facility-administered medications for this visit.     Allergies:   Warfarin and related; Augmentin [amoxicillin-pot clavulanate]; and Avelox [moxifloxacin]    Social History:  The patient  reports that she quit smoking about 38 years ago. She has a 15.00 pack-year smoking history. She has never used smokeless tobacco. She reports that she does not use drugs.   Family History:  The patient's family history includes Heart disease in her sister.    ROS:  Please see the history of present illness.   Otherwise, review of systems are positive for none.   All other systems are reviewed and negative.    PHYSICAL EXAM: VS:  BP (!) 124/58   Pulse 64   Ht 5\' 7"  (1.702 m)   Wt 148 lb 12.8 oz (67.5 kg)   BMI 23.31 kg/m  , BMI Body mass index is 23.31 kg/m. GEN: Well nourished, well developed, in no acute distress  HEENT: normal  Neck: no JVD, carotid bruits, or masses Cardiac: RRR; no murmurs, rubs, or gallops,no edema  Respiratory:  clear to auscultation bilaterally, normal work of breathing GI: soft, nontender, nondistended, + BS MS: no deformity or atrophy  Skin: warm and dry, no rash Neuro:  Strength and sensation are intact Psych: euthymic mood, full affect     Recent Labs: No results found for requested labs within last 8760 hours.    Lipid Panel No results found for: CHOL, TRIG, HDL, CHOLHDL, VLDL, LDLCALC, LDLDIRECT    Wt Readings from Last 3 Encounters:  03/15/17 148 lb 12.8 oz (67.5 kg)  03/01/17 149 lb 3 oz (67.7 kg)  08/30/16 148 lb (67.1 kg)      Other studies Reviewed:  EKG:  EKG is ordered today. The ekg ordered today demonstrates   NSR at 64.  Rare PVCs.    ASSESSMENT AND PLAN:  1.  1. Paroxysmal atrial fibrillation  The patient remains in normal sinus rhythm. She's tolerating the Xarelto very well.  Continue  current meds.  Will see her in 1 year.  Will refill Dilt.   2.   Hyperlipidemia:   On low dose atorvastatin. Managed by primary .   3. Carotid artery disease -  Has moderate Carotid disease  Followed by Dr. Bridgett Larsson .   Current medicines  are reviewed at length with the patient today.  The patient does not have concerns regarding medicines.  The following changes have been made:  no change  Labs/ tests ordered today include: ECG   No orders of the defined types were placed in this encounter.   Disposition:   FU with me  in 1 year.    Signed, Mertie Moores, MD  03/15/2017 8:21 AM    [facility] [place], [place], [place]  [address] Phone: [phone]; Fax: [phone]

## 2017-03-15 NOTE — Patient Instructions (Signed)
Medication Instructions:  Your physician recommends that you continue on your current medications as directed. Please refer to the Current Medication list given to you today.   Labwork: None Ordered   Testing/Procedures: None Ordered   Follow-Up: Your physician wants you to follow-up in: 1 year with Dr. Nahser.  You will receive a reminder letter in the mail two months in advance. If you don't receive a letter, please call our office to schedule the follow-up appointment.   If you need a refill on your cardiac medications before your next appointment, please call your pharmacy.   Thank you for choosing [facility]! Breniya Goertzen, RN [phone]    

## 2017-03-23 DIAGNOSIS — H31092 Other chorioretinal scars, left eye: Secondary | ICD-10-CM | POA: Diagnosis not present

## 2017-03-23 DIAGNOSIS — H2513 Age-related nuclear cataract, bilateral: Secondary | ICD-10-CM | POA: Diagnosis not present

## 2017-03-23 DIAGNOSIS — H4322 Crystalline deposits in vitreous body, left eye: Secondary | ICD-10-CM | POA: Diagnosis not present

## 2017-04-05 ENCOUNTER — Other Ambulatory Visit: Payer: Self-pay | Admitting: Cardiovascular Disease

## 2017-04-05 NOTE — Telephone Encounter (Addendum)
Xarelto 20mg  refill request received; pt is 76 yrs old, Wt-67.5kg, Crea-0.80 on 01/23/17 via labs received from PCP, last seen by Dr. Acie Fredrickson on 03/15/17, and CrCl-61.49ml/min. Will send in refill to requested Pharmacy.

## 2017-04-12 ENCOUNTER — Encounter: Payer: Medicare Other | Admitting: Vascular Surgery

## 2017-05-13 DIAGNOSIS — Z23 Encounter for immunization: Secondary | ICD-10-CM | POA: Diagnosis not present

## 2017-06-13 ENCOUNTER — Ambulatory Visit (INDEPENDENT_AMBULATORY_CARE_PROVIDER_SITE_OTHER): Payer: Medicare Other | Admitting: Internal Medicine

## 2017-06-13 ENCOUNTER — Encounter: Payer: Self-pay | Admitting: Internal Medicine

## 2017-06-13 VITALS — BP 120/50 | HR 72 | Ht 65.0 in | Wt 146.2 lb

## 2017-06-13 DIAGNOSIS — R140 Abdominal distension (gaseous): Secondary | ICD-10-CM

## 2017-06-13 DIAGNOSIS — I6523 Occlusion and stenosis of bilateral carotid arteries: Secondary | ICD-10-CM

## 2017-06-13 DIAGNOSIS — R197 Diarrhea, unspecified: Secondary | ICD-10-CM

## 2017-06-13 MED ORDER — METRONIDAZOLE 250 MG PO TABS: 250.0000 mg | ORAL_TABLET | Freq: Four times a day (QID) | ORAL | 2 refills | Status: DC

## 2017-06-13 NOTE — Patient Instructions (Signed)
We have sent the following medications to your pharmacy for you to pick up at your convenience:  Flagyl    

## 2017-06-13 NOTE — Progress Notes (Signed)
HISTORY OF PRESENT ILLNESS:  Monica Williamson is a 76 y.o. female with multiple medical problems including hypertension, hyperlipidemia, atrial fibrillation on Xarelto, and anxiety/depression. She has been previously evaluated in this office in September 2012 and most recently April 2014 regarding problems with increased intestinal gas and loose stools. She does dictations. Last colonoscopy 2009 which was normal except for diverticulosis. Previous upper endoscopy with duodenal biopsies unremarkable. No evidence for celiac disease. On each occasion she was treated with course of metronidazole which helps symptoms. Today she presents with one-year history of postprandial urgency with loose stools as well as bloating and gas. She states this is similar to previous issues. No fevers, bleeding, weight loss, or true abdominal pain. She asks about dietary options. No nocturnal symptoms. Has one bout almost daily. She is status post cholecystectomy  REVIEW OF SYSTEMS:  All non-GI ROS negative unless otherwise stated in the history of present illness except for anxiety, arthritis, irregular heartbeat, hearing problems  Past Medical History:  Diagnosis Date  . Anxiety   . Arthritis   . Atrial fibrillation ([place])    when takes Warfarin  . Atrial fibrillation, currently in sinus rhythm   . Bronchiectasis ([place])    contolled with lovent and allergy med  . Childhood asthma   . Cholelithiasis   . Colon polyps   . Complication of anesthesia   . Depression   . Diverticulosis   . Dysrhythmia    sees Dr Acie Fredrickson annually for h/o Afib  . H/O bronchiectasis   . History of airborne allergies    uses inhalers  . Hypertension   . Nocturia   . Osteopenia   . PONV (postoperative nausea and vomiting)    severe n/v post anesthesia   . Pulmonary nodule   . Sleep related teeth grinding    wears a mouth guard at night  . Stress fracture of tibia 2014  . Vitamin D deficiency     Past Surgical History:   Procedure Laterality Date  . BREAST SURGERY    . CHOLECYSTECTOMY     11/2010  . INCISION AND DRAINAGE BREAST ABSCESS    . JOINT REPLACEMENT Right 09/2013   knee  . MENISCUS REPAIR Right 2014  . 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   Procedure: RIGHT TOTAL KNEE ARTHROPLASTY;  Surgeon: Vickey Huger, MD;  Location: [place];  Service: Orthopedics;  Laterality: Right;  . 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 TOTAL KNEE ARTHROPLASTY Left 02/24/2014   Procedure: TOTAL KNEE ARTHROPLASTY;  Surgeon: Vickey Huger, MD;  Location: [place];  Service: Orthopedics;  Laterality: Left;  [name] VAGINAL HYSTERECTOMY    . VESICOVAGINAL FISTULA CLOSURE W/ TAH      Social History Monica Williamson  reports that she quit smoking about 38 years ago. She has a 15.00 pack-year smoking history. she has never used smokeless tobacco. She reports that she does not use drugs. Her alcohol history is not on file.  family history includes Heart disease in her sister.  Allergies  Allergen Reactions  . Warfarin And Related Hives and Itching  . Augmentin [Amoxicillin-Pot Clavulanate] Rash  . Avelox [Moxifloxacin] Diarrhea       PHYSICAL EXAMINATION: Vital signs: BP (!) 120/50 (BP Location: Left Arm, Patient Position: Sitting, Cuff Size: Normal)   Pulse 72   Ht 5\' 5"  (1.651 m) Comment: height measured without shoes  Wt 146 lb 4 oz (66.3 kg)   BMI 24.34 kg/m   Constitutional: generally well-appearing,  no acute distress Psychiatric: alert and oriented x3, cooperative Eyes: extraocular movements intact, anicteric, conjunctiva pink Mouth: oral pharynx moist, no lesions Neck: supple without thyromegaly Lymph: no lymphadenopathy Cardiovascular: heart irregular rate and rhythm, no murmur Lungs: clear to auscultation bilaterally Abdomen: soft, nontender, nondistended, no obvious ascites, no peritoneal signs, normal bowel sounds, no organomegaly Rectal: Omitted Extremities: no clubbing, cyanosis, or lower  extremity edema bilaterally Skin: no lesions on visible extremities Neuro: No focal deficits. Cranial nerves intact  ASSESSMENT:  #1. Chronic problems with postprandial urgency with loose stools and chronic bloating. No alarm features. Previous response to metronidazole empirically. Possible etiologies include bacterial overgrowth, IBS, bile salt related diarrhea. Previous workup negative for celiac disease. Last colonoscopy with diverticulosis #2. Multiple medical problems. Stable  PLAN:  #1. Prescribe metronidazole 250 mg 4 times a day for 10 days. If helpful, 2 additional refills provided. If not helpful, contact the office for further evaluation and treatment. She agrees #2. FODMAP diet. Reviewed

## 2017-06-28 ENCOUNTER — Other Ambulatory Visit: Payer: Self-pay | Admitting: Internal Medicine

## 2017-06-28 DIAGNOSIS — Z1231 Encounter for screening mammogram for malignant neoplasm of breast: Secondary | ICD-10-CM

## 2017-07-14 DIAGNOSIS — H903 Sensorineural hearing loss, bilateral: Secondary | ICD-10-CM | POA: Diagnosis not present

## 2017-07-27 ENCOUNTER — Ambulatory Visit
Admission: RE | Admit: 2017-07-27 | Discharge: 2017-07-27 | Disposition: A | Discharge status: Home / Self Care | Payer: Medicare Other | Source: Ambulatory Visit | Attending: Internal Medicine | Admitting: Internal Medicine

## 2017-07-27 DIAGNOSIS — Z1231 Encounter for screening mammogram for malignant neoplasm of breast: Secondary | ICD-10-CM

## 2017-08-14 ENCOUNTER — Encounter (HOSPITAL_COMMUNITY): Payer: 59

## 2017-08-14 NOTE — Progress Notes (Deleted)
Cardiology Office Note    Date:  08/14/2017   ID:  Monica Williamson, DOB 1940-12-21, MRN [id number]  PCP:  Marton Redwood, MD  Cardiologist:  Dr. Acie Fredrickson  Chief Complaint: afib  History of Present Illness:   Monica Williamson is a 77 y.o. female paroxysmal atrial fibrillation on Xarelto for anticoagulation, carotid artery disease followed by Dr. Bridgett Larsson, hyperlipidemia and hypertension presents for follow-up.  Patient was doing fairly well while last seen by Dr. Acie Fredrickson August 2018.  Here today for follow-up.    Past Medical History:  Diagnosis Date  . Anxiety   . Arthritis   . Atrial fibrillation ([place])    when takes Warfarin  . Atrial fibrillation, currently in sinus rhythm   . Bronchiectasis ([place])    contolled with lovent and allergy med  . Childhood asthma   . Cholelithiasis   . Colon polyps   . Complication of anesthesia   . Depression   . Diverticulosis   . Dysrhythmia    sees Dr Acie Fredrickson annually for h/o Afib  . H/O bronchiectasis   . History of airborne allergies    uses inhalers  . Hypertension   . Nocturia   . Osteopenia   . PONV (postoperative nausea and vomiting)    severe n/v post anesthesia   . Pulmonary nodule   . Sleep related teeth grinding    wears a mouth guard at night  . Stress fracture of tibia 2014  . Vitamin D deficiency     Past Surgical History:  Procedure Laterality Date  . BREAST CYST EXCISION Right    over 20 years ago  . BREAST SURGERY    . CHOLECYSTECTOMY     11/2010  . INCISION AND DRAINAGE BREAST ABSCESS    . JOINT REPLACEMENT Right 09/2013   knee  . MENISCUS REPAIR Right 2014  . 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   Procedure: RIGHT TOTAL KNEE ARTHROPLASTY;  Surgeon: Vickey Huger, MD;  Location: [place];  Service: Orthopedics;  Laterality: Right;  . 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 TOTAL KNEE ARTHROPLASTY Left 02/24/2014   Procedure: TOTAL KNEE ARTHROPLASTY;  Surgeon: Vickey Huger, MD;  Location:  [place];  Service: Orthopedics;  Laterality: Left;  [name] VAGINAL HYSTERECTOMY    . VESICOVAGINAL FISTULA CLOSURE W/ TAH      Current Medications: Prior to Admission medications   Medication Sig Start Date End Date Taking? Authorizing Provider  acetaminophen (TYLENOL) 500 MG tablet Take 1,000 mg by mouth every 6 (six) hours as needed for moderate pain or headache.    [provider]  albuterol (PROVENTIL HFA;VENTOLIN HFA) 108 (90 Base) MCG/ACT inhaler Inhale 2 puffs into the lungs 2 (two) times daily as needed for wheezing or shortness of breath. 03/09/16   Parrett, Fonnie Mu, NP  ALPRAZolam (XANAX) 0.5 MG tablet Take 0.25 mg by mouth at bedtime as needed for anxiety or sleep.     [provider]  atorvastatin (LIPITOR) 40 MG tablet Take 40 mg by mouth daily at 6 PM.  02/16/17   [provider]  benzonatate (TESSALON) 100 MG capsule Take 1 capsule by mouth every 8 (eight) hours as needed for cough.  03/08/16   [provider]  buPROPion (WELLBUTRIN XL) 150 MG 24 hr tablet Take 150 mg by mouth every morning.  10/26/10   Nahser, Wonda Cheng, MD  Calcium Carbonate-Vit D-Min (CALCIUM 1200) 1200-1000 MG-UNIT CHEW Chew 1 tablet by mouth every morning.  [provider]  diltiazem (DILACOR XR) 180 MG 24 hr capsule Take 180 mg by mouth daily.    [provider]  fluticasone (FLONASE) 50 MCG/ACT nasal spray Place 2 sprays into both nostrils daily. 03/31/16   Brand Males, MD  fluticasone (FLOVENT HFA) 110 MCG/ACT inhaler Inhale 2 puffs into the lungs 2 (two) times daily as needed (for shortness of breath). 03/09/16   Parrett, Fonnie Mu, NP  metroNIDAZOLE (FLAGYL) 250 MG tablet Take 1 tablet (250 mg total) by mouth 4 (four) times daily. 06/13/17   Irene Shipper, MD  propranolol (INDERAL) 10 MG tablet Take 1 tablet (10 mg total) by mouth 4 (four) times daily as needed (FOR PALPITATIONS Can take ONE every 30 minutes Up to 4 Doses). 08/15/16   Nahser, Wonda Cheng, MD    valsartan-hydrochlorothiazide (DIOVAN-HCT) 320-12.5 MG tablet Take 1 tablet by mouth daily.  01/23/17   [provider]  XARELTO 20 MG TABS tablet TAKE 1 TABLET DAILY WITH SUPPER 04/06/17   Nahser, Wonda Cheng, MD    Allergies:   Warfarin and related; Augmentin [amoxicillin-pot clavulanate]; and Avelox [moxifloxacin]   Social History   Socioeconomic History  . Marital status: Married    Spouse name: Not on file  . Number of children: 3  . Years of education: Not on file  . Highest education level: Not on file  Social Needs  . Financial resource strain: Not on file  . Food insecurity - worry: Not on file  . Food insecurity - inability: Not on file  . Transportation needs - medical: Not on file  . Transportation needs - non-medical: Not on file  Occupational History  . Occupation: Retired  Tobacco Use  . Smoking status: Former Smoker    Packs/day: 0.75    Years: 20.00    Pack years: 15.00    Last attempt to quit: 07/18/1978    Years since quitting: 39.1  . Smokeless tobacco: Never Used  Substance and Sexual Activity  . Alcohol use: Not on file    Comment: daily cocktail  . Drug use: No  . Sexual activity: Yes    Birth control/protection: Post-menopausal  Other Topics Concern  . Not on file  Social History Narrative  . Not on file     Family History:  The patient's family history includes Heart disease in her sister. ***  ROS:   Please see the history of present illness.    ROS All other systems reviewed and are negative.   PHYSICAL EXAM:   VS:  There were no vitals taken for this visit.   GEN: Well nourished, well developed, in no acute distress  HEENT: normal  Neck: no JVD, carotid bruits, or masses Cardiac: ***RRR; no murmurs, rubs, or gallops,no edema  Respiratory:  clear to auscultation bilaterally, normal work of breathing GI: soft, nontender, nondistended, + BS MS: no deformity or atrophy  Skin: warm and dry, no rash Neuro:  Alert and Oriented x 3,  Strength and sensation are intact Psych: euthymic mood, full affect  Wt Readings from Last 3 Encounters:  06/13/17 146 lb 4 oz (66.3 kg)  03/15/17 148 lb 12.8 oz (67.5 kg)  03/01/17 149 lb 3 oz (67.7 kg)      Studies/Labs Reviewed:   EKG:  EKG is ordered today.  The ekg ordered today demonstrates ***  Recent Labs: No results found for requested labs within last 8760 hours.   Lipid Panel No results found for: CHOL, TRIG, HDL, CHOLHDL, VLDL,  [place], LDLDIRECT  Additional studies/ records that were reviewed today include:   Echocardiogram: 10/2013 Study Conclusions  - Left ventricle: The cavity size was normal. Systolic function was normal. The estimated ejection fraction was in the range of 60% to 65%. Wall motion was normal; there were no regional wall motion abnormalities. - Mitral valve: Calcified annulus. Mild regurgitation. - Atrial septum: No defect or patent foramen ovale was identified. Transthoracic echocardiography. M-mode, complete 2D, spectral    ASSESSMENT & PLAN:    1. PAF  2. HTN  3. HLD    Medication Adjustments/Labs and Tests Ordered: Current medicines are reviewed at length with the patient today.  Concerns regarding medicines are outlined above.  Medication changes, Labs and Tests ordered today are listed in the Patient Instructions below. There are no Patient Instructions on file for this visit.   Jarrett Soho, [place]  08/14/2017 12:30 PM    [facility] [place], [place], [place]  [address] Phone: [phone]; Fax: [phone]

## 2017-08-15 ENCOUNTER — Encounter: Payer: Self-pay | Admitting: Cardiology

## 2017-08-15 ENCOUNTER — Ambulatory Visit (INDEPENDENT_AMBULATORY_CARE_PROVIDER_SITE_OTHER): Payer: Medicare Other | Admitting: Cardiology

## 2017-08-15 VITALS — BP 124/60 | HR 62 | Ht 65.0 in | Wt 149.0 lb

## 2017-08-15 DIAGNOSIS — I480 Paroxysmal atrial fibrillation: Secondary | ICD-10-CM

## 2017-08-15 NOTE — Progress Notes (Signed)
08/15/2017 Monica Williamson   08-13-1940  [id number]  Primary Physician Marton Redwood, MD Primary Cardiologist: Dr. Acie Fredrickson   Reason for Visit/CC: f/u for PAF  HPI:  Monica Williamson is a 77 y.o. female, followed by Dr. Acie Fredrickson, with a h/o PAF. CHA2DS2 VASc score is 3 for Age >38 and female sex. She is on Xarelto for a/c and takes scheduled daily diltiazem.  She presents to clinic today given recurrent symptoms. She has had 2 episodes of palpitations in the last month, c/w with previous afib, relieved with PRN propanolol. Currently asymptomatic. She denies any recent CP or dyspnea. Exercises at the gym several days a week. No exertional symptoms. She denies any recent illness. No n/v/d. Reports full med compliance with Diltiazem and Xarelto. Avoids caffeine and ETOH. No recurrence in symptoms over the last 2 weeks. In retrospect, she thinks that her episodes have been related to excitement watching sporting events, particularly tennis. Given she is better, pt does not want to adjust current regimen at this time. Pt just wanted to get checked out for reassurance. EKG today shows NSR with 1 PVC. HR 62 bpm.   2D Echo 10/2010 Study Conclusions  - Left ventricle: The cavity size was normal. Systolic function was normal. The estimated ejection fraction was in the range of 60% to 65%. Wall motion was normal; there were no regional wall motion abnormalities. - Mitral valve: Calcified annulus. Mild regurgitation. - Atrial septum: No defect or patent foramen ovale was identified. Transthoracic echocardiography. M-mode, complete 2D, spectral Doppler, and color Doppler. Height: Height: 170.2cm. Height: 67in. Weight: Weight: 71.8kg. Weight: 158lb. Body mass index: BMI: 24.8kg/m^2. Body surface area: BSA: 1.24m^2. Blood pressure: 146/65. Patient status: Outpatient. Location: [facility]   Current Meds  Medication Sig  . acetaminophen (TYLENOL) 500 MG tablet Take 1,000 mg by mouth  every 6 (six) hours as needed for moderate pain or headache.  . albuterol (PROVENTIL HFA;VENTOLIN HFA) 108 (90 Base) MCG/ACT inhaler Inhale 2 puffs into the lungs 2 (two) times daily as needed for wheezing or shortness of breath.  . ALPRAZolam (XANAX) 0.5 MG tablet Take 0.25 mg by mouth at bedtime as needed for anxiety or sleep.   [name] atorvastatin (LIPITOR) 40 MG tablet Take 40 mg by mouth daily at 6 PM.   . benzonatate (TESSALON) 100 MG capsule Take 1 capsule by mouth every 8 (eight) hours as needed for cough.   [name] buPROPion (WELLBUTRIN XL) 150 MG 24 hr tablet Take 150 mg by mouth every morning.   . Calcium Carbonate-Vit D-Min (CALCIUM 1200) 1200-1000 MG-UNIT CHEW Chew 1 tablet by mouth every morning.   . diltiazem (DILACOR XR) 180 MG 24 hr capsule Take 180 mg by mouth daily.  . fluticasone (FLONASE) 50 MCG/ACT nasal spray Place 2 sprays into both nostrils daily.  . fluticasone (FLOVENT HFA) 110 MCG/ACT inhaler Inhale 2 puffs into the lungs 2 (two) times daily as needed (for shortness of breath).  . metroNIDAZOLE (FLAGYL) 250 MG tablet Take 1 tablet (250 mg total) by mouth 4 (four) times daily.  . propranolol (INDERAL) 10 MG tablet Take 1 tablet (10 mg total) by mouth 4 (four) times daily as needed (FOR PALPITATIONS Can take ONE every 30 minutes Up to 4 Doses).  . valsartan-hydrochlorothiazide (DIOVAN-HCT) 320-12.5 MG tablet Take 1 tablet by mouth daily.   Monica Williamson 20 MG TABS tablet TAKE 1 TABLET DAILY WITH SUPPER   Allergies  Allergen Reactions  . Warfarin And Related Hives and  Itching  . Augmentin [Amoxicillin-Pot Clavulanate] Rash  . Avelox [Moxifloxacin] Diarrhea   Past Medical History:  Diagnosis Date  . Anxiety   . Arthritis   . Atrial fibrillation ([place])    when takes Warfarin  . Atrial fibrillation, currently in sinus rhythm   . Bronchiectasis ([place])    contolled with lovent and allergy med  . Childhood asthma   . Cholelithiasis   . Colon polyps   . Complication of anesthesia     . Depression   . Diverticulosis   . Dysrhythmia    sees Dr Acie Fredrickson annually for h/o Afib  . H/O bronchiectasis   . History of airborne allergies    uses inhalers  . Hypertension   . Nocturia   . Osteopenia   . PONV (postoperative nausea and vomiting)    severe n/v post anesthesia   . Pulmonary nodule   . Sleep related teeth grinding    wears a mouth guard at night  . Stress fracture of tibia 2014  . Vitamin D deficiency    Family History  Problem Relation Age of Onset  . Heart disease Sister   . Colon cancer Neg Hx    Past Surgical History:  Procedure Laterality Date  . BREAST CYST EXCISION Right    over 20 years ago  . BREAST SURGERY    . CHOLECYSTECTOMY     11/2010  . INCISION AND DRAINAGE BREAST ABSCESS    . JOINT REPLACEMENT Right 09/2013   knee  . MENISCUS REPAIR Right 2014  . 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   Procedure: RIGHT TOTAL KNEE ARTHROPLASTY;  Surgeon: Vickey Huger, MD;  Location: [facility];  Service: Orthopedics;  Laterality: Right;  . 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 TOTAL KNEE ARTHROPLASTY Left 02/24/2014   Procedure: TOTAL KNEE ARTHROPLASTY;  Surgeon: Vickey Huger, MD;  Location: [facility];  Service: Orthopedics;  Laterality: Left;  [name] VAGINAL HYSTERECTOMY    . VESICOVAGINAL FISTULA CLOSURE W/ TAH     Social History   Socioeconomic History  . Marital status: Married    Spouse name: Not on file  . Number of children: 3  . Years of education: Not on file  . Highest education level: Not on file  Social Needs  . Financial resource strain: Not on file  . Food insecurity - worry: Not on file  . Food insecurity - inability: Not on file  . Transportation needs - medical: Not on file  . Transportation needs - non-medical: Not on file  Occupational History  . Occupation: Retired  Tobacco Use  . Smoking status: Former Smoker    Packs/day: 0.75    Years: 20.00    Pack years: 15.00    Last attempt to quit: 07/18/1978    Years since  quitting: 39.1  . Smokeless tobacco: Never Used  Substance and Sexual Activity  . Alcohol use: Not on file    Comment: daily cocktail  . Drug use: No  . Sexual activity: Yes    Birth control/protection: Post-menopausal  Other Topics Concern  . Not on file  Social History Narrative  . Not on file     Review of Systems: General: negative for chills, fever, night sweats or weight changes.  Cardiovascular: negative for chest pain, dyspnea on exertion, edema, orthopnea, palpitations, paroxysmal nocturnal dyspnea or shortness of breath Dermatological: negative for rash Respiratory: negative for cough or wheezing Urologic: negative for hematuria Abdominal: negative for nausea, vomiting, diarrhea, bright red  blood per rectum, melena, or hematemesis Neurologic: negative for visual changes, syncope, or dizziness All other systems reviewed and are otherwise negative except as noted above.   Physical Exam:  Blood pressure 124/60, pulse 62, height 5\' 5"  (1.651 m), weight 149 lb (67.6 kg), SpO2 97 %.  General appearance: alert, cooperative and no distress Neck: no carotid bruit and no JVD Lungs: clear to auscultation bilaterally Heart: regular rate and rhythm, S1, S2 normal, no murmur, click, rub or gallop Extremities: extremities normal, atraumatic, no cyanosis or edema Pulses: 2+ and symmetric Skin: Skin color, texture, turgor normal. No rashes or lesions Neurologic: Grossly normal  EKG NSR with PVC -- personally reviewed   ASSESSMENT AND PLAN:   1. PAF: NSR on EKG today. HR is well controlled in the low 60s. She is on Xarelto for a/c. We discussed management options and pt plans to continue on current regimen of daily diltiazem and will continue to use propanolol PRN. She will notify our office if symptoms occur more frequently or if symptoms fail to improve with propanolol. Continue Xarelto for a/c. Pt encouraged to stay well hydrated and to limit possible triggers such as caffeine and  ETOH. Keep yearly f/u with Dr. Acie Fredrickson.   [place] Radu Ladoris Gene, MHS [facility] 08/15/2017 8:29 AM

## 2017-08-15 NOTE — Patient Instructions (Signed)
Medication Instructions:  Your physician recommends that you continue on your current medications as directed. Please refer to the Current Medication list given to you today.   Labwork: NONE ORDERED TODAY  Testing/Procedures: NONE ORDERED TODAY  Follow-Up: Your physician wants you to follow-up in: 02/2018 WITH DR. Acie Fredrickson  You will receive a reminder letter in the mail two months in advance. If you don't receive a letter, please call our office to schedule the follow-up appointment.   Any Other Special Instructions Will Be Listed Below (If Applicable).     If you need a refill on your cardiac medications before your next appointment, please call your pharmacy.

## 2017-08-18 ENCOUNTER — Encounter: Payer: Self-pay | Admitting: Family

## 2017-08-18 ENCOUNTER — Ambulatory Visit (HOSPITAL_COMMUNITY)
Admission: RE | Admit: 2017-08-18 | Discharge: 2017-08-18 | Disposition: A | Discharge status: Home / Self Care | Payer: Medicare Other | Source: Ambulatory Visit | Attending: Vascular Surgery | Admitting: Vascular Surgery

## 2017-08-18 ENCOUNTER — Ambulatory Visit (INDEPENDENT_AMBULATORY_CARE_PROVIDER_SITE_OTHER): Payer: Medicare Other | Admitting: Family

## 2017-08-18 ENCOUNTER — Other Ambulatory Visit: Payer: Self-pay

## 2017-08-18 VITALS — BP 139/71 | HR 56 | Temp 97.0°F | Resp 14 | Ht 66.0 in | Wt 147.0 lb

## 2017-08-18 DIAGNOSIS — I739 Peripheral vascular disease, unspecified: Secondary | ICD-10-CM

## 2017-08-18 DIAGNOSIS — I6523 Occlusion and stenosis of bilateral carotid arteries: Secondary | ICD-10-CM

## 2017-08-18 DIAGNOSIS — I779 Disorder of arteries and arterioles, unspecified: Secondary | ICD-10-CM | POA: Diagnosis not present

## 2017-08-18 DIAGNOSIS — I6521 Occlusion and stenosis of right carotid artery: Secondary | ICD-10-CM | POA: Diagnosis not present

## 2017-08-18 LAB — VAS US CAROTID
LCCADSYS: 64 cm/s
LCCAPDIAS: 16 cm/s
LEFT ECA DIAS: 6 cm/s
LEFT VERTEBRAL DIAS: 17 cm/s
LICADSYS: -94 cm/s
LICAPDIAS: 31 cm/s
Left CCA dist dias: 17 cm/s
Left CCA prox sys: 66 cm/s
Left ICA dist dias: -20 cm/s
Left ICA prox sys: 119 cm/s
RCCADSYS: -100 cm/s
RCCAPDIAS: 10 cm/s
RCCAPSYS: 51 cm/s
RIGHT CCA MID DIAS: 11 cm/s
RIGHT ECA DIAS: 14 cm/s
RIGHT VERTEBRAL DIAS: 11 cm/s

## 2017-08-18 NOTE — Patient Instructions (Signed)

## 2017-08-18 NOTE — Progress Notes (Signed)
Chief Complaint: Follow up Extracranial Carotid Artery Stenosis   History of Present Illness  Monica Williamson is a 77 y.o. female whom Dr. Bridgett Larsson evaluated on 03-01-17 following an abnormal carotid study.  Patient has never had TIA or CVA sx.  She had a R carotid bruit found on physical exam and has been getting B carotid duplexes subsequently.  Recent carotid studies demonstrated: RICA 16-10% stenosis, LICA 96-04% stenosis.  The patient has never had amaurosis fugax or monocular blindness.  The patient has never had unilateral facial drooping or hemiplegia.  The patient has never had receptive or expressive aphasia.     The patient's risks factors for carotid disease include: HTN, prior smoker.   At pt August 2018 visit  With Dr. Bridgett Larsson, based on pt's description, it appeared that her recent B carotid duplex was possibly inaccurate as the patient was actively coughing during the study.  Based on the patient's vascular studies and examination, Dr. Bridgett Larsson offered the patient: CTA Neck.  The CTA should give [place] degree of stenosis and anatomic information on any lesions in her carotid arteries. This study was to be scheduled the following week at the patient's convenience.    Dr. Bridgett Larsson spoke with pt by phone on 03-07-17 to discuss results of CTA neck on 03-03-17:   60% diameter stenosis proximal right internal carotid artery due to atherosclerotic disease  40% diameter stenosis proximal left internal carotid artery due to atherosclerotic disease  No significant vertebral artery stenosis.  Based on Dr. Lianne Moris review of the patient's CTA, the patient has an area of smooth sclerotic disease in the R proximal ICA that measures between 50-60% by NASCET criteria.  Dr. Bridgett Larsson discussed again with the patient that the R carotid duplex is dependent upon the human operator, so there is a higher rate of error with such. - Would continue with maximal medical mgmt of this patient's B carotid disease -  Follow up in 6 month for repeat B carotid duplex - Per pt's wishes, canceled her 29 AUG 18 appointment.  She denies claudication sx's in her legs with walking.   She has little strength in her right hand, has not been determined why this is. 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  She has osteoporosis, treated with Prolia injections.   She exercises 6 days/week in a gym. Utilizes Silver sneakers.   Diabetic: no Tobacco use: former smoker, quit in 1980, smoked x 20 years  Pt meds include: Statin : yes ASA: no Other anticoagulants/antiplatelets: Xarelto, has a hx of atrial fib   Past Medical History:  Diagnosis Date  . Anxiety   . Arthritis   . Atrial fibrillation ([place])    when takes Warfarin  . Atrial fibrillation, currently in sinus rhythm   . Bronchiectasis ([place])    contolled with lovent and allergy med  . Childhood asthma   . Cholelithiasis   . Colon polyps   . Complication of anesthesia   . Depression   . Diverticulosis   . Dysrhythmia    sees Dr Acie Fredrickson annually for h/o Afib  . H/O bronchiectasis   . History of airborne allergies    uses inhalers  . Hypertension   . Nocturia   . Osteopenia   . PONV (postoperative nausea and vomiting)    severe n/v post anesthesia   . Pulmonary nodule   . Sleep related teeth grinding    wears a mouth guard at night  . Stress fracture of tibia 2014  .  Vitamin D deficiency     Social History Social History   Tobacco Use  . Smoking status: Former Smoker    Packs/day: 0.75    Years: 20.00    Pack years: 15.00    Last attempt to quit: 07/18/1978    Years since quitting: 39.1  . Smokeless tobacco: Never Used  Substance Use Topics  . Alcohol use: Not on file    Comment: daily cocktail  . Drug use: No    Family History Family History  Problem Relation Age of Onset  . Heart disease Sister   . Colon cancer Neg Hx     Surgical History Past Surgical History:  Procedure Laterality Date  . BREAST CYST EXCISION Right     over 20 years ago  . BREAST SURGERY    . CHOLECYSTECTOMY     11/2010  . INCISION AND DRAINAGE BREAST ABSCESS    . JOINT REPLACEMENT Right 09/2013   knee  . MENISCUS REPAIR Right 2014  . 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   Procedure: RIGHT TOTAL KNEE ARTHROPLASTY;  Surgeon: Vickey Huger, MD;  Location: [facility];  Service: Orthopedics;  Laterality: Right;  . 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 TOTAL KNEE ARTHROPLASTY Left 02/24/2014   Procedure: TOTAL KNEE ARTHROPLASTY;  Surgeon: Vickey Huger, MD;  Location: [facility];  Service: Orthopedics;  Laterality: Left;  [name] VAGINAL HYSTERECTOMY    . VESICOVAGINAL FISTULA CLOSURE W/ TAH      Allergies  Allergen Reactions  . Warfarin And Related Hives and Itching  . Augmentin [Amoxicillin-Pot Clavulanate] Rash  . Avelox [Moxifloxacin] Diarrhea    Current Outpatient Medications  Medication Sig Dispense Refill  . acetaminophen (TYLENOL) 500 MG tablet Take 1,000 mg by mouth every 6 (six) hours as needed for moderate pain or headache.    . albuterol (PROVENTIL HFA;VENTOLIN HFA) 108 (90 Base) MCG/ACT inhaler Inhale 2 puffs into the lungs 2 (two) times daily as needed for wheezing or shortness of breath. 1 Inhaler 5  . ALPRAZolam (XANAX) 0.5 MG tablet Take 0.25 mg by mouth at bedtime as needed for anxiety or sleep.     [name] atorvastatin (LIPITOR) 40 MG tablet Take 40 mg by mouth daily at 6 PM.     . benzonatate (TESSALON) 100 MG capsule Take 1 capsule by mouth every 8 (eight) hours as needed for cough.     [name] buPROPion (WELLBUTRIN XL) 150 MG 24 hr tablet Take 150 mg by mouth every morning.  30 tablet 11  . Calcium Carbonate-Vit D-Min (CALCIUM 1200) 1200-1000 MG-UNIT CHEW Chew 1 tablet by mouth every morning.     . diltiazem (DILACOR XR) 180 MG 24 hr capsule Take 180 mg by mouth daily.    . fluticasone (FLONASE) 50 MCG/ACT nasal spray Place 2 sprays into both nostrils daily. 16 g 5  . fluticasone (FLOVENT HFA) 110 MCG/ACT inhaler Inhale 2 puffs  into the lungs 2 (two) times daily as needed (for shortness of breath). 1 Inhaler 5  . metroNIDAZOLE (FLAGYL) 250 MG tablet Take 1 tablet (250 mg total) by mouth 4 (four) times daily. 40 tablet 2  . propranolol (INDERAL) 10 MG tablet Take 1 tablet (10 mg total) by mouth 4 (four) times daily as needed (FOR PALPITATIONS Can take ONE every 30 minutes Up to 4 Doses). 60 tablet 11  . valsartan-hydrochlorothiazide (DIOVAN-HCT) 320-12.5 MG tablet Take 1 tablet by mouth daily.     Alveda Reasons 20 MG TABS tablet TAKE 1  TABLET DAILY WITH SUPPER 90 tablet 3   No current facility-administered medications for this visit.     Review of Systems : See HPI for pertinent positives and negatives.  Physical Examination  Vitals:   08/18/17 0937 08/18/17 0942 08/18/17 0944  BP: (!) 143/66 140/67 139/71  Pulse: (!) 56 (!) 56 (!) 56  Resp: 14    Temp: (!) 97 F (36.1 C)    TempSrc: Oral    SpO2: (!) 56%    Weight: 147 lb (66.7 kg)    Height: 5\' 6"  (1.676 m)     Body mass index is 23.73 kg/m.  General: WDWN fit appearing female in NAD GAIT: normal Eyes: PERRLA HENT: No gross abnormalities.  Pulmonary:  Respirations are non-labored, good air movement, CTAB, no rales, rhonchi, or wheezing. Cardiac: regular rhythm,  no detected murmur.  VASCULAR EXAM Carotid Bruits Right Left   Negative Negative     Abdominal aortic pulse is not palpable. Radial pulses are 2+ palpable and equal.                                                                                                                            LE Pulses Right Left       POPLITEAL  not palpable   not palpable       POSTERIOR TIBIAL   palpable    palpable        DORSALIS PEDIS      ANTERIOR TIBIAL not palpable  not palpable     Gastrointestinal: soft, nontender, BS WNL, no r/g, no palpable masses. Musculoskeletal: No muscle atrophy/wasting. M/S 5/5 throughout, extremities without ischemic changes. Skin: No rashes, no ulcers, no  cellulitis.   Neurologic:  A&O X 3; appropriate affect, sensation is normal; speech is normal, CN 2-12 intact, pain and light touch intact in extremities, motor exam as listed above. Psychiatric: Normal thought content, mood appropriate to clinical situation.    Assessment: Monica Williamson is a 77 y.o. female who has no history of stroke or TIA.   She is slim, fit, exercises most days of the week. She does not have DM, stopped smoking in 1980 (smoked x 20 years), takes Xarelto for a hx of atrial fib, and takes a daily statin.   DATA Carotid Duplex (08-18-17): Right ICA: 40-59% stenosis Right ECA with >50% stenosis Left ICA: 1-39% stenosis Bilateral vertebral artery flow is antegrade.  Bilateral subclavian artery waveforms are normal.  No significant change compared to the CTA neck on 03-03-17.     Plan: Follow-up in 9 months with Carotid Duplex scan.   I discussed in depth with the patient the nature of atherosclerosis, and emphasized the importance of maximal medical management including strict control of blood pressure, blood glucose, and lipid levels, obtaining regular exercise, and continued cessation of smoking.  The patient is aware that without maximal medical management the underlying atherosclerotic disease process will progress, limiting the benefit of any interventions. The  patient was given information about stroke prevention and what symptoms should prompt the patient to seek immediate medical care. Thank you for allowing [place] to participate in this patient's care.  Clemon Chambers, RN, MSN, FNP-C [facility] Office: [phone]  Clinic Physician: Bridgett Larsson  08/18/17 9:56 AM

## 2017-08-19 ENCOUNTER — Encounter: Payer: Self-pay | Admitting: Family

## 2017-08-30 ENCOUNTER — Other Ambulatory Visit: Payer: Self-pay

## 2017-08-30 DIAGNOSIS — I6523 Occlusion and stenosis of bilateral carotid arteries: Secondary | ICD-10-CM

## 2017-09-05 DIAGNOSIS — M25551 Pain in right hip: Secondary | ICD-10-CM | POA: Diagnosis not present

## 2017-09-05 DIAGNOSIS — Z471 Aftercare following joint replacement surgery: Secondary | ICD-10-CM | POA: Diagnosis not present

## 2017-09-05 DIAGNOSIS — Z96651 Presence of right artificial knee joint: Secondary | ICD-10-CM | POA: Diagnosis not present

## 2017-09-08 ENCOUNTER — Ambulatory Visit: Payer: Medicare Other | Admitting: Family

## 2017-09-08 ENCOUNTER — Encounter (HOSPITAL_COMMUNITY): Payer: Medicare Other

## 2017-09-08 ENCOUNTER — Other Ambulatory Visit (HOSPITAL_COMMUNITY): Payer: Self-pay | Admitting: *Deleted

## 2017-09-11 ENCOUNTER — Encounter (HOSPITAL_COMMUNITY)
Admission: RE | Admit: 2017-09-11 | Discharge: 2017-09-11 | Disposition: A | Discharge status: Home / Self Care | Payer: Medicare Other | Source: Ambulatory Visit | Attending: Internal Medicine | Admitting: Internal Medicine

## 2017-09-11 DIAGNOSIS — M810 Age-related osteoporosis without current pathological fracture: Secondary | ICD-10-CM | POA: Diagnosis not present

## 2017-09-11 MED ORDER — DENOSUMAB 60 MG/ML ~~LOC~~ SOLN
60.0000 mg | Freq: Once | SUBCUTANEOUS | Status: AC
  Administered 2017-09-11: 60 mg via SUBCUTANEOUS
  Filled 2017-09-11: qty 1

## 2017-10-17 DIAGNOSIS — G8929 Other chronic pain: Secondary | ICD-10-CM | POA: Diagnosis not present

## 2017-10-17 DIAGNOSIS — Z96651 Presence of right artificial knee joint: Secondary | ICD-10-CM | POA: Diagnosis not present

## 2017-10-17 DIAGNOSIS — M25561 Pain in right knee: Secondary | ICD-10-CM | POA: Diagnosis not present

## 2017-11-06 ENCOUNTER — Other Ambulatory Visit: Payer: Self-pay | Admitting: Cardiovascular Disease

## 2017-11-13 DIAGNOSIS — M546 Pain in thoracic spine: Secondary | ICD-10-CM | POA: Diagnosis not present

## 2017-11-13 DIAGNOSIS — Z6824 Body mass index (BMI) 24.0-24.9, adult: Secondary | ICD-10-CM | POA: Diagnosis not present

## 2017-11-13 DIAGNOSIS — R209 Unspecified disturbances of skin sensation: Secondary | ICD-10-CM | POA: Diagnosis not present

## 2017-11-28 DIAGNOSIS — M546 Pain in thoracic spine: Secondary | ICD-10-CM | POA: Diagnosis not present

## 2017-12-04 DIAGNOSIS — M546 Pain in thoracic spine: Secondary | ICD-10-CM | POA: Diagnosis not present

## 2017-12-06 ENCOUNTER — Encounter: Payer: Self-pay | Admitting: Internal Medicine

## 2017-12-06 ENCOUNTER — Ambulatory Visit (INDEPENDENT_AMBULATORY_CARE_PROVIDER_SITE_OTHER): Payer: Medicare Other | Admitting: Internal Medicine

## 2017-12-06 VITALS — BP 98/52 | HR 69 | Ht 66.0 in | Wt 148.0 lb

## 2017-12-06 DIAGNOSIS — I6523 Occlusion and stenosis of bilateral carotid arteries: Secondary | ICD-10-CM

## 2017-12-06 DIAGNOSIS — R140 Abdominal distension (gaseous): Secondary | ICD-10-CM | POA: Diagnosis not present

## 2017-12-06 DIAGNOSIS — R197 Diarrhea, unspecified: Secondary | ICD-10-CM

## 2017-12-06 NOTE — Patient Instructions (Signed)
Please follow up as needed 

## 2017-12-12 DIAGNOSIS — M546 Pain in thoracic spine: Secondary | ICD-10-CM | POA: Diagnosis not present

## 2017-12-13 ENCOUNTER — Encounter: Payer: Self-pay | Admitting: Internal Medicine

## 2017-12-13 NOTE — Progress Notes (Signed)
HISTORY OF PRESENT ILLNESS:  Monica Williamson is a 77 y.o. female with hypertension, hyperlipidemia, atrial fibrillation on Xarelto, and anxiety/depression. She has been seen in this office on a number of occasions with complaints of increased intestinal gas and loose stools. Last evaluated November 2018. Treated with metronidazole which seemed to help.Advised with regards to FODMAP diet. The patient states that she did well until about 6 weeks ago. Recurrent problems of bloating and gas and loose stools. She tells me that metronidazole only seems to help while taking it and a few days thereafter. She is pleased report to me that when she discontinues alcohol and sugar-containing products, her symptoms are markedly better. No problems over the past week or so no alarm features. Last colonoscopy 2009 was normal.  REVIEW OF SYSTEMS:  All non-GI ROS negative except for back pain and irregular heartbeat  Past Medical History:  Diagnosis Date  . Anxiety   . Arthritis   . Atrial fibrillation ([place])    when takes Warfarin  . Atrial fibrillation, currently in sinus rhythm   . Bronchiectasis ([place])    contolled with lovent and allergy med  . Childhood asthma   . Cholelithiasis   . Colon polyps   . Complication of anesthesia   . Depression   . Diverticulosis   . Dysrhythmia    sees Dr Acie Fredrickson annually for h/o Afib  . H/O bronchiectasis   . History of airborne allergies    uses inhalers  . Hypertension   . Nocturia   . Osteopenia   . PONV (postoperative nausea and vomiting)    severe n/v post anesthesia   . Pulmonary nodule   . Sleep related teeth grinding    wears a mouth guard at night  . Stress fracture of tibia 2014  . Vitamin D deficiency     Past Surgical History:  Procedure Laterality Date  . BREAST CYST EXCISION Right    over 20 years ago  . BREAST SURGERY    . CHOLECYSTECTOMY     11/2010  . INCISION AND DRAINAGE BREAST ABSCESS    . JOINT REPLACEMENT Right 09/2013   knee   . MENISCUS REPAIR Right 2014  . 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   Procedure: RIGHT TOTAL KNEE ARTHROPLASTY;  Surgeon: Vickey Huger, MD;  Location: [facility];  Service: Orthopedics;  Laterality: Right;  . 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 TOTAL KNEE ARTHROPLASTY Left 02/24/2014   Procedure: TOTAL KNEE ARTHROPLASTY;  Surgeon: Vickey Huger, MD;  Location: [place];  Service: Orthopedics;  Laterality: Left;  [name] VAGINAL HYSTERECTOMY    . VESICOVAGINAL FISTULA CLOSURE W/ TAH      Social History [place] [address]  reports that she quit smoking about 39 years ago. She has a 15.00 pack-year smoking history. She has never used smokeless tobacco. She reports that she does not use drugs. Her alcohol history is not on file.  family history includes Heart disease in her sister.  Allergies  Allergen Reactions  . Warfarin And Related Hives and Itching  . Augmentin [Amoxicillin-Pot Clavulanate] Rash  . Avelox [Moxifloxacin] Diarrhea       PHYSICAL EXAMINATION: Vital signs: BP (!) 98/52   Pulse 69   Ht 5\' 6"  (1.676 m)   Wt 148 lb (67.1 kg)   BMI 23.89 kg/m   Constitutional: generally well-appearing, no acute distress Psychiatric: alert and oriented x3, cooperative Eyes: extraocular movements intact, anicteric, conjunctiva pink Mouth: oral pharynx moist, no lesions  Neck: supple no lymphadenopathy Cardiovascular: heart regular rate and rhythm, no murmur Lungs: clear to auscultation bilaterally Abdomen: soft, nontender, nondistended, no obvious ascites, no peritoneal signs, normal bowel sounds, no organomegaly Rectal:omitted Extremities: no lower extremity edema bilaterally Skin: no lesions on visible extremities Neuro: No focal deficits. Cranor is intact  ASSESSMENT:  #1. Intermittent problems with bloating and loose stools. Previously thought to possibly have bacterial overgrowth. Symptoms now improved with dietary intervention avoiding alcohol and  sugar   PLAN:  #1. Continue to avoid alcohol and sugar #2. GI follow-up as needed

## 2018-01-02 DIAGNOSIS — M546 Pain in thoracic spine: Secondary | ICD-10-CM | POA: Diagnosis not present

## 2018-01-16 DIAGNOSIS — D225 Melanocytic nevi of trunk: Secondary | ICD-10-CM | POA: Diagnosis not present

## 2018-01-16 DIAGNOSIS — L570 Actinic keratosis: Secondary | ICD-10-CM | POA: Diagnosis not present

## 2018-01-16 DIAGNOSIS — L821 Other seborrheic keratosis: Secondary | ICD-10-CM | POA: Diagnosis not present

## 2018-02-26 DIAGNOSIS — I10 Essential (primary) hypertension: Secondary | ICD-10-CM | POA: Diagnosis not present

## 2018-02-26 DIAGNOSIS — R82998 Other abnormal findings in urine: Secondary | ICD-10-CM | POA: Diagnosis not present

## 2018-02-26 DIAGNOSIS — M859 Disorder of bone density and structure, unspecified: Secondary | ICD-10-CM | POA: Diagnosis not present

## 2018-02-26 DIAGNOSIS — E7849 Other hyperlipidemia: Secondary | ICD-10-CM | POA: Diagnosis not present

## 2018-03-05 DIAGNOSIS — J479 Bronchiectasis, uncomplicated: Secondary | ICD-10-CM | POA: Diagnosis not present

## 2018-03-05 DIAGNOSIS — I480 Paroxysmal atrial fibrillation: Secondary | ICD-10-CM | POA: Diagnosis not present

## 2018-03-05 DIAGNOSIS — Z6824 Body mass index (BMI) 24.0-24.9, adult: Secondary | ICD-10-CM | POA: Diagnosis not present

## 2018-03-05 DIAGNOSIS — Z0000 Encounter for general adult medical examination without abnormal findings: Secondary | ICD-10-CM | POA: Diagnosis not present

## 2018-03-05 DIAGNOSIS — J45909 Unspecified asthma, uncomplicated: Secondary | ICD-10-CM | POA: Diagnosis not present

## 2018-03-05 DIAGNOSIS — Z7901 Long term (current) use of anticoagulants: Secondary | ICD-10-CM | POA: Diagnosis not present

## 2018-03-05 DIAGNOSIS — Z1389 Encounter for screening for other disorder: Secondary | ICD-10-CM | POA: Diagnosis not present

## 2018-03-05 DIAGNOSIS — E7849 Other hyperlipidemia: Secondary | ICD-10-CM | POA: Diagnosis not present

## 2018-03-05 DIAGNOSIS — I6523 Occlusion and stenosis of bilateral carotid arteries: Secondary | ICD-10-CM | POA: Diagnosis not present

## 2018-03-05 DIAGNOSIS — N183 Chronic kidney disease, stage 3 (moderate): Secondary | ICD-10-CM | POA: Diagnosis not present

## 2018-03-05 DIAGNOSIS — I10 Essential (primary) hypertension: Secondary | ICD-10-CM | POA: Diagnosis not present

## 2018-03-05 DIAGNOSIS — M8580 Other specified disorders of bone density and structure, unspecified site: Secondary | ICD-10-CM | POA: Diagnosis not present

## 2018-03-09 DIAGNOSIS — Z1212 Encounter for screening for malignant neoplasm of rectum: Secondary | ICD-10-CM | POA: Diagnosis not present

## 2018-03-15 ENCOUNTER — Encounter: Payer: Self-pay | Admitting: Cardiovascular Disease

## 2018-03-15 ENCOUNTER — Ambulatory Visit (INDEPENDENT_AMBULATORY_CARE_PROVIDER_SITE_OTHER): Payer: Medicare Other | Admitting: Cardiovascular Disease

## 2018-03-15 VITALS — BP 132/60 | HR 70 | Ht 66.0 in | Wt 150.0 lb

## 2018-03-15 DIAGNOSIS — I480 Paroxysmal atrial fibrillation: Secondary | ICD-10-CM | POA: Diagnosis not present

## 2018-03-15 DIAGNOSIS — I6523 Occlusion and stenosis of bilateral carotid arteries: Secondary | ICD-10-CM | POA: Diagnosis not present

## 2018-03-15 NOTE — Patient Instructions (Signed)
Medication Instructions:  Your physician recommends that you continue on your current medications as directed. Please refer to the Current Medication list given to you today.   Labwork: None Ordered   Testing/Procedures: None Ordered   Follow-Up: Your physician wants you to follow-up in: 1 year with Dr. Nahser.  You will receive a reminder letter in the mail two months in advance. If you don't receive a letter, please call our office to schedule the follow-up appointment.   If you need a refill on your cardiac medications before your next appointment, please call your pharmacy.   Thank you for choosing [facility]! Macklen Wilhoite, RN [phone]    

## 2018-03-15 NOTE — Progress Notes (Signed)
Cardiology Office Note   Date:  03/15/2018   ID:  Monica Williamson, Monica Williamson 06/23/1941, MRN [id number]  PCP:  Marton Redwood, MD  Cardiologist:   Mertie Moores, MD   Chief Complaint  Patient presents with  . Atrial Fibrillation      Monica Williamson is a 77 y.o. female who presents for follow-up of her paroxysmal atrial fibrillation.  She has a CHADS VASC 2 score of 3 and is on Xarelto .   She has had both  knees replaced.  She has had only 2 episodes of PAF - resolved very quickly with the propranolol.   Doing well.   Jan. 27, 2017:  Jan. 29, 2018:  Monica Williamson is seen today for follow up of her PAF .  Has had 3 episodes of PAF in December.  Takes a propranolol with resolution ( 1-2 tabs )  Typically occurs at night ,  Lasts perhaps 30 minutes.   Aug. 29, 2018:  Monica Williamson is doing well Has had several episodes of paroxysmal atrial fib.  Doing well Leaving for [place] in 2 weeks.    Aug. 29, 2019 Monica Williamson is seen today for follow-up of her paroxysmal atrial fibrillation.   Monica Williamson came to her appt today .   I last saw her in August, 2018.  She saw [name], [place] in January, 2019 for some palpitations.  She is found to be in normal sinus rhythm and was reassured.  She is been on Xarelto.  Diltiazem seems to control her atrial fibrillation fairly well.  BP has been quite variable recently - has had low diastolic BP readings. Saw Dr. Brigitte Pulse.    Was having some dizziness.   Also had ringing in her ears.   con  HCTZ was stopped.   Now on Valsartan. LDL was found to be slightly elevated - 85.   Was started on Zetia.  Scheduled to have repeat lipids drawn in November. Has had more palpitations.  Takes propranolol with resolution of her symptoms  Thinks she is having more atiral fib.  She cannot really feel any palpitations - just gets a notification when her BP says she has an irregular HR   Has moderate carotid disease.  She has been seen by the vein and vascular  specialist.  She is scheduled for repeat duplex scan in November.    Past Medical History:  Diagnosis Date  . Anxiety   . Arthritis   . Atrial fibrillation ([place])    when takes Warfarin  . Atrial fibrillation, currently in sinus rhythm   . Bronchiectasis ([place])    contolled with lovent and allergy med  . Childhood asthma   . Cholelithiasis   . Colon polyps   . Complication of anesthesia   . Depression   . Diverticulosis   . Dysrhythmia    sees Dr Acie Fredrickson annually for h/o Afib  . H/O bronchiectasis   . History of airborne allergies    uses inhalers  . Hypertension   . Nocturia   . Osteopenia   . PONV (postoperative nausea and vomiting)    severe n/v post anesthesia   . Pulmonary nodule   . Sleep related teeth grinding    wears a mouth guard at night  . Stress fracture of tibia 2014  . Vitamin D deficiency     Past Surgical History:  Procedure Laterality Date  . BREAST CYST EXCISION Right    over 20 years ago  . BREAST SURGERY    .  CHOLECYSTECTOMY     11/2010  . INCISION AND DRAINAGE BREAST ABSCESS    . JOINT REPLACEMENT Right 09/2013   knee  . MENISCUS REPAIR Right 2014  . 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   Procedure: RIGHT TOTAL KNEE ARTHROPLASTY;  Surgeon: Vickey Huger, MD;  Location: [facility];  Service: Orthopedics;  Laterality: Right;  . 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 TOTAL KNEE ARTHROPLASTY Left 02/24/2014   Procedure: TOTAL KNEE ARTHROPLASTY;  Surgeon: Vickey Huger, MD;  Location: [facility];  Service: Orthopedics;  Laterality: Left;  [name] VAGINAL HYSTERECTOMY    . VESICOVAGINAL FISTULA CLOSURE W/ TAH       Current Outpatient Medications  Medication Sig Dispense Refill  . acetaminophen (TYLENOL) 500 MG tablet Take 1,000 mg by mouth every 6 (six) hours as needed for moderate pain or headache.    . albuterol (PROVENTIL HFA;VENTOLIN HFA) 108 (90 Base) MCG/ACT inhaler Inhale 2 puffs into the lungs 2 (two) times daily as needed for wheezing or  shortness of breath. 1 Inhaler 5  . ALPRAZolam (XANAX) 0.5 MG tablet Take 0.25 mg by mouth at bedtime as needed for anxiety or sleep.     [name] atorvastatin (LIPITOR) 40 MG tablet Take 40 mg by mouth daily at 6 PM.     . benzonatate (TESSALON) 100 MG capsule Take 1 capsule by mouth every 8 (eight) hours as needed for cough.     [name] buPROPion (WELLBUTRIN XL) 150 MG 24 hr tablet Take 150 mg by mouth every morning.  30 tablet 11  . Calcium Carbonate-Vit D-Min (CALCIUM 1200) 1200-1000 MG-UNIT CHEW Chew 1 tablet by mouth every morning.     . diltiazem (DILACOR XR) 180 MG 24 hr capsule Take 180 mg by mouth daily.    [name] ezetimibe (ZETIA) 10 MG tablet Take 1 tablet by mouth daily as needed.    . fluticasone (FLONASE) 50 MCG/ACT nasal spray Place 2 sprays into both nostrils daily. 16 g 5  . fluticasone (FLOVENT HFA) 110 MCG/ACT inhaler Inhale 2 puffs into the lungs 2 (two) times daily as needed (for shortness of breath). 1 Inhaler 5  . metroNIDAZOLE (FLAGYL) 250 MG tablet Take 1 tablet (250 mg total) by mouth 4 (four) times daily. 40 tablet 2  . propranolol (INDERAL) 10 MG tablet Take 1 tablet (10 mg total) by mouth 4 (four) times daily as needed (FOR PALPITATIONS Can take ONE every 30 minutes Up to 4 Doses). 60 tablet 11  . rivaroxaban (XARELTO) 20 MG TABS tablet Take 20 mg by mouth daily with supper.    . valsartan (DIOVAN) 320 MG tablet Take 1 tablet by mouth daily.     No current facility-administered medications for this visit.     Allergies:   Warfarin and related; Augmentin [amoxicillin-pot clavulanate]; and Avelox [moxifloxacin]    Social History:  The patient  reports that she quit smoking about 39 years ago. She has a 15.00 pack-year smoking history. She has never used smokeless tobacco. She reports that she does not use drugs.   Family History:  The patient's family history includes Heart disease in her sister.    ROS:  Please see the history of present illness.   Otherwise, review of systems  are positive for none.   All other systems are reviewed and negative.    Physical Exam: Blood pressure 132/60, pulse 70, height 5\' 6"  (1.676 m), weight 150 lb (68 kg), SpO2 97 %.  GEN:  Well nourished, well  developed in no acute distress HEENT: Normal NECK: No JVD;  Moderate right carotid bruit, soft left carotid bruit  LYMPHATICS: No lymphadenopathy CARDIAC: RR , occasional premature beat .  RESPIRATORY:  Clear to auscultation without rales, wheezing or rhonchi  ABDOMEN: Soft, non-tender, non-distended MUSCULOSKELETAL:  No edema; No deformity  SKIN: Warm and dry NEUROLOGIC:  Alert and oriented x 3    Recent Labs: No results found for requested labs within last 8760 hours.    Lipid Panel No results found for: CHOL, TRIG, HDL, CHOLHDL, VLDL, LDLCALC, LDLDIRECT    Wt Readings from Last 3 Encounters:  03/15/18 150 lb (68 kg)  12/06/17 148 lb (67.1 kg)  08/18/17 147 lb (66.7 kg)      Other studies Reviewed:  EKG:  [name]    ASSESSMENT AND PLAN:  1.  1. Paroxysmal atrial fibrillation   Stable,   Cont. xarelto   Is having palpitations on occasion now but I suspect that these are actually premature ventricular contractions.  Has not had any prolonged episodes of irregularities that would suggest atrial fibrillation.  2.   Hyperlipidemia:   - cont Atorvastatin and Zetia.  Her last LDL was 85 and the Zetia was added after that lab result..  She is scheduled to have it rechecked by her primary medical doctor in November.  3. Carotid artery disease -she has moderate right carotid stenosis.  She is been followed at vein and vascular specialist.      Current medicines are reviewed at length with the patient today.  The patient does not have concerns regarding medicines.  The following changes have been made:  no change  Labs/ tests ordered today include: ECG   No orders of the defined types were placed in this encounter.   Disposition:   FU with me  in 1 year.     Signed, Mertie Moores, MD  03/15/2018 1:27 PM    [facility] [place] [address], [place], [place]  [address] Phone: [phone]; Fax: [phone]

## 2018-03-21 ENCOUNTER — Other Ambulatory Visit (HOSPITAL_COMMUNITY): Payer: Self-pay

## 2018-03-22 ENCOUNTER — Encounter (HOSPITAL_COMMUNITY): Payer: Medicare Other

## 2018-03-22 ENCOUNTER — Encounter (HOSPITAL_COMMUNITY)
Admission: RE | Admit: 2018-03-22 | Discharge: 2018-03-22 | Disposition: A | Discharge status: Home / Self Care | Payer: Medicare Other | Source: Ambulatory Visit | Attending: Internal Medicine | Admitting: Internal Medicine

## 2018-03-22 DIAGNOSIS — M810 Age-related osteoporosis without current pathological fracture: Secondary | ICD-10-CM | POA: Diagnosis not present

## 2018-03-22 MED ORDER — DENOSUMAB 60 MG/ML ~~LOC~~ SOSY
60.0000 mg | PREFILLED_SYRINGE | Freq: Once | SUBCUTANEOUS | Status: AC
  Administered 2018-03-22: 60 mg via SUBCUTANEOUS

## 2018-03-22 MED ORDER — DENOSUMAB 60 MG/ML ~~LOC~~ SOSY
PREFILLED_SYRINGE | SUBCUTANEOUS | Status: AC
  Filled 2018-03-22: qty 1

## 2018-03-28 DIAGNOSIS — Z1211 Encounter for screening for malignant neoplasm of colon: Secondary | ICD-10-CM | POA: Diagnosis not present

## 2018-03-29 DIAGNOSIS — H2513 Age-related nuclear cataract, bilateral: Secondary | ICD-10-CM | POA: Diagnosis not present

## 2018-03-29 DIAGNOSIS — H4322 Crystalline deposits in vitreous body, left eye: Secondary | ICD-10-CM | POA: Diagnosis not present

## 2018-03-29 DIAGNOSIS — H31093 Other chorioretinal scars, bilateral: Secondary | ICD-10-CM | POA: Diagnosis not present

## 2018-03-31 DIAGNOSIS — Z23 Encounter for immunization: Secondary | ICD-10-CM | POA: Diagnosis not present

## 2018-04-02 ENCOUNTER — Other Ambulatory Visit: Payer: Self-pay | Admitting: Cardiovascular Disease

## 2018-04-02 MED ORDER — RIVAROXABAN 15 MG PO TABS: 15.0000 mg | ORAL_TABLET | Freq: Every day | ORAL | 1 refills | Status: DC

## 2018-04-02 NOTE — Telephone Encounter (Signed)
SCr 1.1 >> CrCl 44.64mL/min, pt qualifies for Xarelto 15mg  daily dose. Pt is aware of dose change.

## 2018-04-24 DIAGNOSIS — M7631 Iliotibial band syndrome, right leg: Secondary | ICD-10-CM | POA: Diagnosis not present

## 2018-05-04 DIAGNOSIS — M25561 Pain in right knee: Secondary | ICD-10-CM | POA: Diagnosis not present

## 2018-05-04 DIAGNOSIS — G8929 Other chronic pain: Secondary | ICD-10-CM | POA: Diagnosis not present

## 2018-05-04 DIAGNOSIS — Z96651 Presence of right artificial knee joint: Secondary | ICD-10-CM | POA: Diagnosis not present

## 2018-05-18 ENCOUNTER — Encounter: Payer: Self-pay | Admitting: Family

## 2018-05-18 ENCOUNTER — Ambulatory Visit (INDEPENDENT_AMBULATORY_CARE_PROVIDER_SITE_OTHER): Payer: Medicare Other | Admitting: Family

## 2018-05-18 ENCOUNTER — Other Ambulatory Visit: Payer: Self-pay

## 2018-05-18 ENCOUNTER — Telehealth: Payer: Self-pay | Admitting: Family

## 2018-05-18 ENCOUNTER — Ambulatory Visit (HOSPITAL_COMMUNITY)
Admission: RE | Admit: 2018-05-18 | Discharge: 2018-05-18 | Disposition: A | Discharge status: Home / Self Care | Payer: Medicare Other | Source: Ambulatory Visit | Attending: Family | Admitting: Family

## 2018-05-18 VITALS — BP 122/62 | HR 61 | Temp 97.0°F | Resp 16 | Ht 66.0 in | Wt 150.0 lb

## 2018-05-18 DIAGNOSIS — I6523 Occlusion and stenosis of bilateral carotid arteries: Secondary | ICD-10-CM | POA: Diagnosis not present

## 2018-05-18 DIAGNOSIS — Z87891 Personal history of nicotine dependence: Secondary | ICD-10-CM | POA: Diagnosis not present

## 2018-05-18 NOTE — Progress Notes (Signed)
Chief Complaint: Follow up Extracranial Carotid Artery Stenosis   History of Present Illness  Monica Williamson is a 77 y.o. female whom Dr. Bridgett Larsson evaluated on 03-01-17 following an abnormal carotid study.Patient has never had TIA or CVA sx. She had a R carotid bruit found on physical exam and has been getting B carotid duplexes subsequently. 02-14-17 carotid studies demonstrated: TMHD62-22% stenosis, LICA40-59% stenosis. The patient has neverhad amaurosis fugax or monocular blindness. The patient has never had unilateral facial drooping or hemiplegia. The patient has neverhad receptive or expressive aphasia.   The patient's risks factors for carotid disease include:HTN, prior smoker.  At pt August 2018 visit with Dr. Bridgett Larsson, based on pt's description, it appeared that her recent B carotid duplex was possibly inaccurate as the patient was actively coughing during the study. Based on the patient's vascular studies and examination, Dr. Bridgett Larsson offered the patient:CTA Neck. The CTA should give [place] degree of stenosis and anatomic information on any lesions in her carotid arteries. This study was to be scheduled the following week at the patient's convenience.   Dr. Bridgett Larsson spoke with pt by phone on 03-07-17 to discuss results of CTA neck on 03-03-17:   60% diameter stenosis proximal right internal carotid artery due to atherosclerotic disease  40% diameter stenosis proximal left internal carotid artery due to atherosclerotic disease  No significant vertebral artery stenosis.  Based on Dr. Lianne Moris review of the patient's CTA, the patient has an area of smooth sclerotic disease in the R proximal ICA that measures between 50-60% by NASCET criteria.  Dr. Bridgett Larsson discussed again with the patient that the R carotid duplex is dependent upon the human operator, so there is a higher rate of error with such. - Would continue with maximal medical mgmt of this patient's B carotid disease -  Follow up in 6 month for repeat B carotid duplex - Per pt's wishes, canceled her 29 AUG 18 appointment.  She denies claudication symptoms in her legs with walking.   She remains physically active.   Diabetic: no Tobacco use: former smoker, quit in 1980, smoked x 20 years  Pt meds include: Statin : yes ASA: no Other anticoagulants/antiplatelets: Xarelto, has a hx of atrial fib   Past Medical History:  Diagnosis Date  . Anxiety   . Arthritis   . Atrial fibrillation ([place])    when takes Warfarin  . Atrial fibrillation, currently in sinus rhythm   . Bronchiectasis ([place])    contolled with lovent and allergy med  . Childhood asthma   . Cholelithiasis   . Colon polyps   . Complication of anesthesia   . Depression   . Diverticulosis   . Dysrhythmia    sees Dr Acie Fredrickson annually for h/o Afib  . H/O bronchiectasis   . History of airborne allergies    uses inhalers  . Hypertension   . Nocturia   . Osteopenia   . PONV (postoperative nausea and vomiting)    severe n/v post anesthesia   . Pulmonary nodule   . Sleep related teeth grinding    wears a mouth guard at night  . Stress fracture of tibia 2014  . Vitamin D deficiency     Social History Social History   Tobacco Use  . Smoking status: Former Smoker    Packs/day: 0.75    Years: 20.00    Pack years: 15.00    Last attempt to quit: 07/18/1978    Years since quitting: 39.8  . Smokeless tobacco: Never  Used  Substance Use Topics  . Alcohol use: Not on file    Comment: daily cocktail  . Drug use: No    Family History Family History  Problem Relation Age of Onset  . Heart disease Sister   . Colon cancer Neg Hx     Surgical History Past Surgical History:  Procedure Laterality Date  . BREAST CYST EXCISION Right    over 20 years ago  . BREAST SURGERY    . CHOLECYSTECTOMY     11/2010  . INCISION AND DRAINAGE BREAST ABSCESS    . JOINT REPLACEMENT Right 09/2013   knee  . MENISCUS REPAIR Right 2014  . TOTAL  KNEE ARTHROPLASTY Right 09/30/2013   Procedure: RIGHT TOTAL KNEE ARTHROPLASTY;  Surgeon: Vickey Huger, MD;  Location: [facility];  Service: Orthopedics;  Laterality: Right;  . 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 TOTAL KNEE ARTHROPLASTY Left 02/24/2014   Procedure: TOTAL KNEE ARTHROPLASTY;  Surgeon: Vickey Huger, MD;  Location: [facility];  Service: Orthopedics;  Laterality: Left;  [name] VAGINAL HYSTERECTOMY    . VESICOVAGINAL FISTULA CLOSURE W/ TAH      Allergies  Allergen Reactions  . Warfarin And Related Hives and Itching  . Augmentin [Amoxicillin-Pot Clavulanate] Rash  . Avelox [Moxifloxacin] Diarrhea    Current Outpatient Medications  Medication Sig Dispense Refill  . acetaminophen (TYLENOL) 500 MG tablet Take 1,000 mg by mouth every 6 (six) hours as needed for moderate pain or headache.    . albuterol (PROVENTIL HFA;VENTOLIN HFA) 108 (90 Base) MCG/ACT inhaler Inhale 2 puffs into the lungs 2 (two) times daily as needed for wheezing or shortness of breath. 1 Inhaler 5  . ALPRAZolam (XANAX) 0.5 MG tablet Take 0.25 mg by mouth at bedtime as needed for anxiety or sleep.     [name] atorvastatin (LIPITOR) 40 MG tablet Take 40 mg by mouth daily at 6 PM.     . benzonatate (TESSALON) 100 MG capsule Take 1 capsule by mouth every 8 (eight) hours as needed for cough.     [name] buPROPion (WELLBUTRIN XL) 150 MG 24 hr tablet Take 150 mg by mouth every morning.  30 tablet 11  . Calcium Carbonate-Vit D-Min (CALCIUM 1200) 1200-1000 MG-UNIT CHEW Chew 1 tablet by mouth every morning.     . diltiazem (DILACOR XR) 180 MG 24 hr capsule Take 180 mg by mouth daily.    [name] ezetimibe (ZETIA) 10 MG tablet Take 1 tablet by mouth daily as needed.    . fluticasone (FLONASE) 50 MCG/ACT nasal spray Place 2 sprays into both nostrils daily. 16 g 5  . fluticasone (FLOVENT HFA) 110 MCG/ACT inhaler Inhale 2 puffs into the lungs 2 (two) times daily as needed (for shortness of breath). 1 Inhaler 5  . metroNIDAZOLE (FLAGYL) 250 MG  tablet Take 1 tablet (250 mg total) by mouth 4 (four) times daily. 40 tablet 2  . propranolol (INDERAL) 10 MG tablet Take 1 tablet (10 mg total) by mouth 4 (four) times daily as needed (FOR PALPITATIONS Can take ONE every 30 minutes Up to 4 Doses). 60 tablet 11  . Rivaroxaban (XARELTO) 15 MG TABS tablet Take 1 tablet (15 mg total) by mouth daily with supper. 90 tablet 1  . valsartan (DIOVAN) 320 MG tablet Take 1 tablet by mouth daily.     No current facility-administered medications for this visit.     Review of Systems : See HPI for pertinent positives and negatives.  Physical Examination  Vitals:   05/18/18 1034 05/18/18 1038  BP: 123/67 122/62  Pulse: 61 61  Resp: 16   Temp: (!) 97 F (36.1 C)   TempSrc: Oral   SpO2: 100%   Weight: 150 lb (68 kg)   Height: 5\' 6"  (1.676 m)    Body mass index is 24.21 kg/m.  General: WDWN fit appearing female in NAD GAIT: normal Eyes: PERRLA HENT: No gross abnormalities.  Pulmonary:  Respirations are non-labored, good air movement in all fields, CTAB, no rales, rhonchi, or wheezing. Cardiac: regular rhythm,  no detected murmur.  VASCULAR EXAM Carotid Bruits Right Left   Negative Negative     Abdominal aortic pulse is not palpable. Radial pulses are 2+ palpable and equal.                                                                                                                                          LE Pulses Right Left       POPLITEAL  not palpable   not palpable       POSTERIOR TIBIAL   palpable    palpable        DORSALIS PEDIS      ANTERIOR TIBIAL not palpable  not palpable     Gastrointestinal: soft, nontender, BS WNL, no r/g, no palpable masses. Musculoskeletal: No muscle atrophy/wasting. M/S 5/5 throughout, extremities without ischemic changes. Skin: No rashes, no ulcers, no cellulitis.   Neurologic:  A&O X 3; appropriate affect, sensation is normal; speech is normal, CN 2-12 intact, pain and light touch  intact in extremities, motor exam as listed above. Psychiatric: Normal thought content, mood appropriate to clinical situation.    Assessment: Monica Williamson is a 78 y.o. female who has no history of stroke or TIA.   She is slim, fit, stays physically active. She does not have DM, stopped smoking in 1980 (smoked x 20 years), takes Xarelto for a hx of atrial fib, and takes a daily statin.   Pt states her husband had a bad experence at [facility], that she wants to go to [facility] if she needs surgery.  Pt wants her PCP to refer her to a vascular surgeon with [facility].  Pt has no history of stroke or TIA.  Another medical center may prefer further imaging at their own center, I discussed this with pt.  Pt to follow up with [place] as needed.      DATA Carotid Duplex (05-18-18): Right ICA: EDV of 157 at the proximal ICA, 80-99% stenosis Left ICA: 40-59% stenosis Bilateral vertebral artery flow is antegrade.  Bilateral subclavian artery waveforms are normal.  Increased stenosis in the bilateral ICA compared to the exam on 08-18-17.    Plan: Follow-up as needed.   I discussed in depth with the patient the nature of atherosclerosis, and emphasized the importance of maximal medical management including strict  control of blood pressure, blood glucose, and lipid levels, obtaining regular exercise, and continued cessation of smoking.  The patient is aware that without maximal medical management the underlying atherosclerotic disease process will progress, limiting the benefit of any interventions. The patient was given information about stroke prevention and what symptoms should prompt the patient to seek immediate medical care. Thank you for allowing [place] to participate in this patient's care.  Clemon Chambers, RN, MSN, FNP-C [facility] Office: [phone]  Clinic Physician:   05/18/18 10:52 AM

## 2018-05-18 NOTE — Telephone Encounter (Signed)
New Message  Pt is in lobby stating they are needing a letter stating she is not able to fly due to medical reasons.  Pts verbalized needing letter today or in a few.

## 2018-05-18 NOTE — Patient Instructions (Signed)

## 2018-05-24 DIAGNOSIS — Z7901 Long term (current) use of anticoagulants: Secondary | ICD-10-CM | POA: Diagnosis not present

## 2018-05-24 DIAGNOSIS — Z87891 Personal history of nicotine dependence: Secondary | ICD-10-CM | POA: Diagnosis not present

## 2018-05-24 DIAGNOSIS — I6521 Occlusion and stenosis of right carotid artery: Secondary | ICD-10-CM | POA: Diagnosis not present

## 2018-06-05 DIAGNOSIS — E7849 Other hyperlipidemia: Secondary | ICD-10-CM | POA: Diagnosis not present

## 2018-06-08 DIAGNOSIS — Z7901 Long term (current) use of anticoagulants: Secondary | ICD-10-CM | POA: Diagnosis not present

## 2018-06-08 DIAGNOSIS — Z87891 Personal history of nicotine dependence: Secondary | ICD-10-CM | POA: Diagnosis not present

## 2018-06-08 DIAGNOSIS — E785 Hyperlipidemia, unspecified: Secondary | ICD-10-CM | POA: Diagnosis present

## 2018-06-08 DIAGNOSIS — Z95828 Presence of other vascular implants and grafts: Secondary | ICD-10-CM | POA: Diagnosis not present

## 2018-06-08 DIAGNOSIS — Z09 Encounter for follow-up examination after completed treatment for conditions other than malignant neoplasm: Secondary | ICD-10-CM | POA: Diagnosis not present

## 2018-06-08 DIAGNOSIS — I4891 Unspecified atrial fibrillation: Secondary | ICD-10-CM | POA: Diagnosis present

## 2018-06-08 DIAGNOSIS — F419 Anxiety disorder, unspecified: Secondary | ICD-10-CM | POA: Diagnosis present

## 2018-06-08 DIAGNOSIS — I6521 Occlusion and stenosis of right carotid artery: Secondary | ICD-10-CM | POA: Diagnosis present

## 2018-06-08 DIAGNOSIS — Z79899 Other long term (current) drug therapy: Secondary | ICD-10-CM | POA: Diagnosis not present

## 2018-06-08 DIAGNOSIS — F329 Major depressive disorder, single episode, unspecified: Secondary | ICD-10-CM | POA: Diagnosis present

## 2018-06-08 DIAGNOSIS — I10 Essential (primary) hypertension: Secondary | ICD-10-CM | POA: Diagnosis present

## 2018-06-15 IMAGING — MG DIGITAL SCREENING BILATERAL MAMMOGRAM WITH CAD
4 series · 4 of 4 positions shown · non-contrast
Comparison: Previous exam(s).

CLINICAL DATA: Screening.

EXAM:
DIGITAL SCREENING BILATERAL MAMMOGRAM WITH CAD

[R MLO]
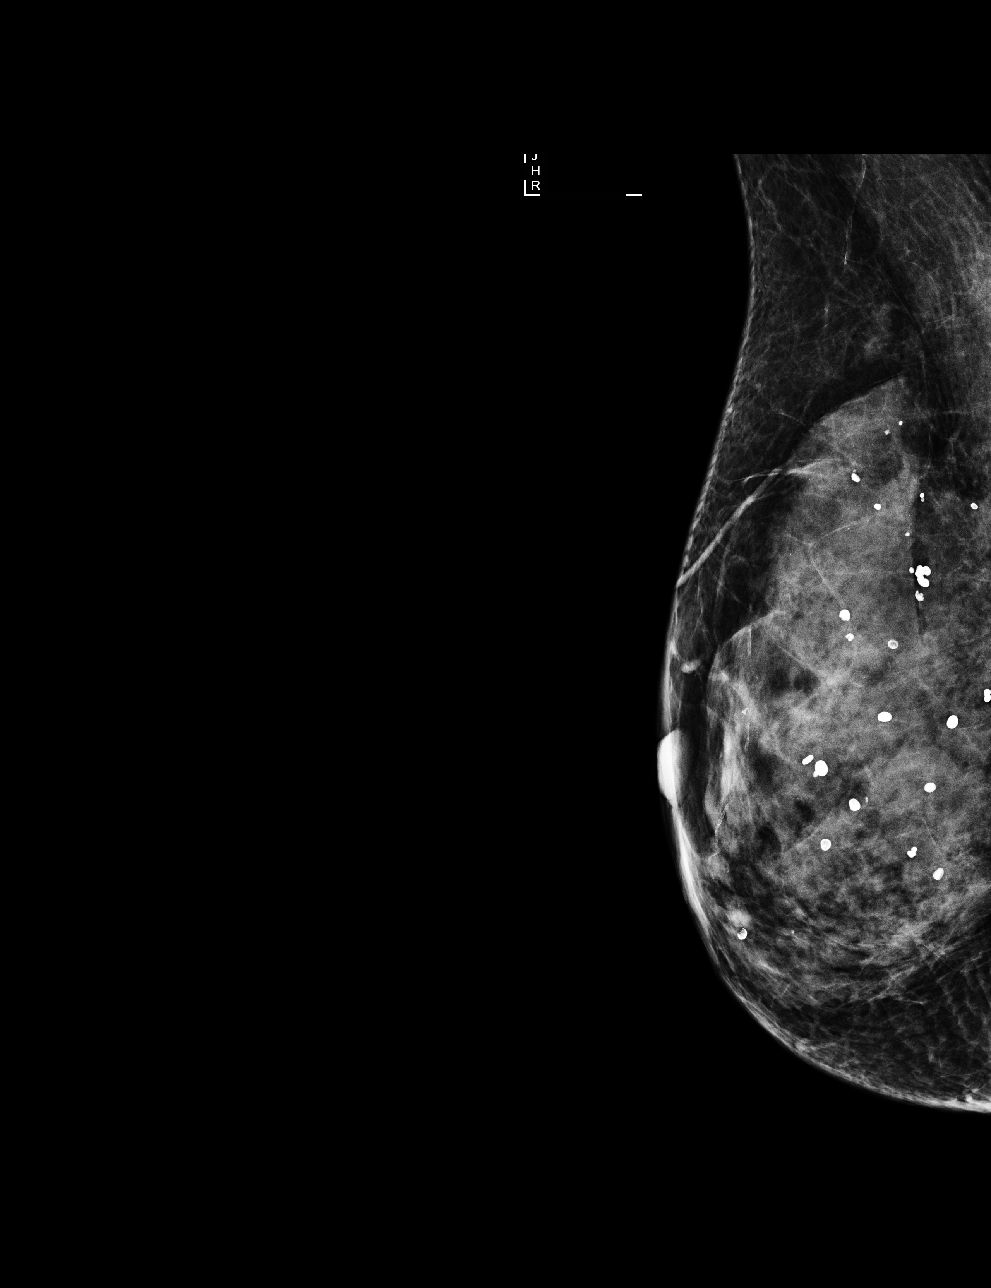

[R CC]
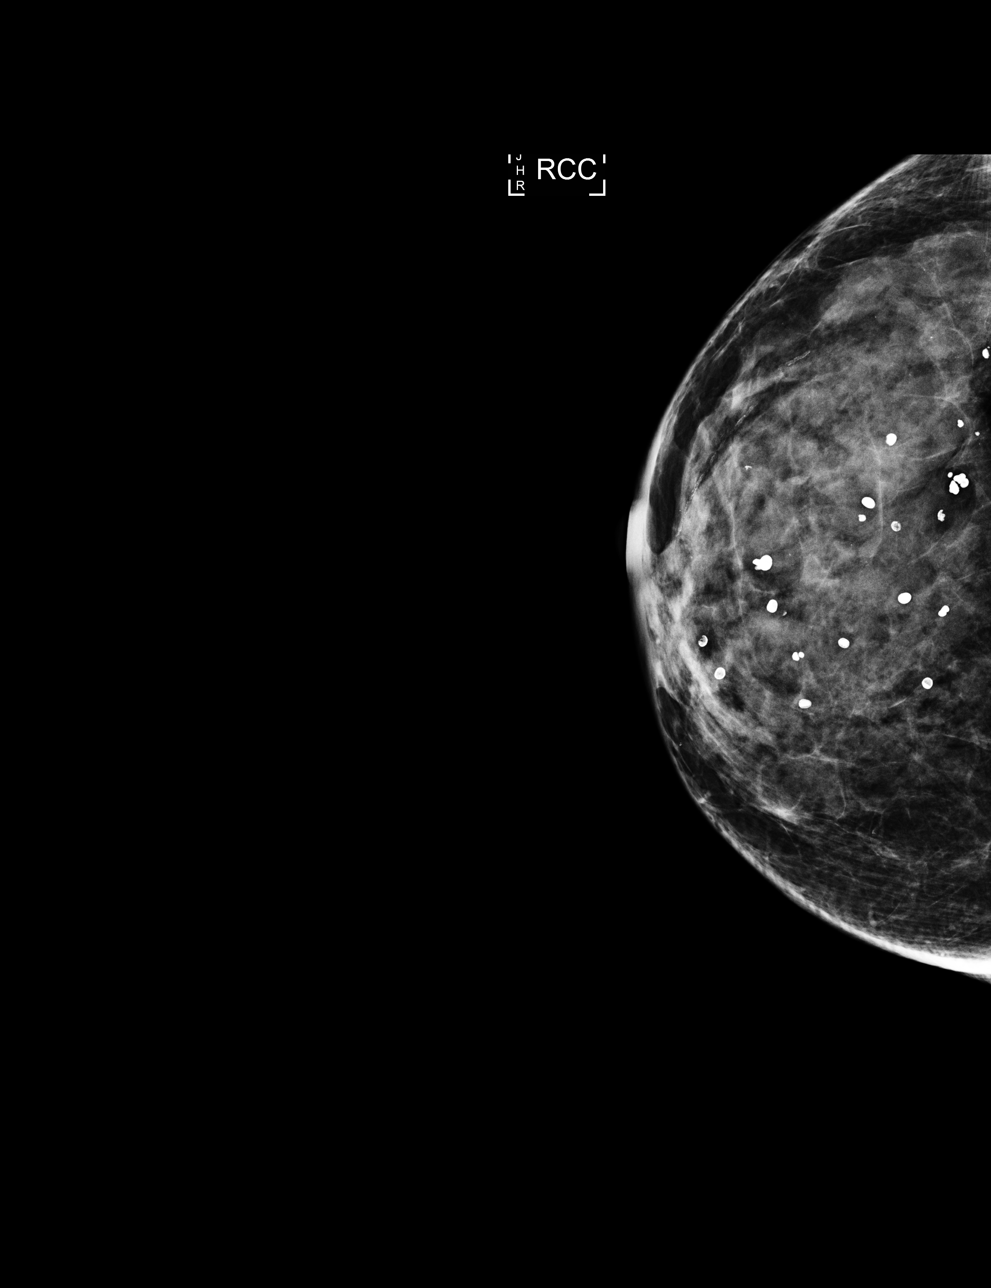

[L CC]
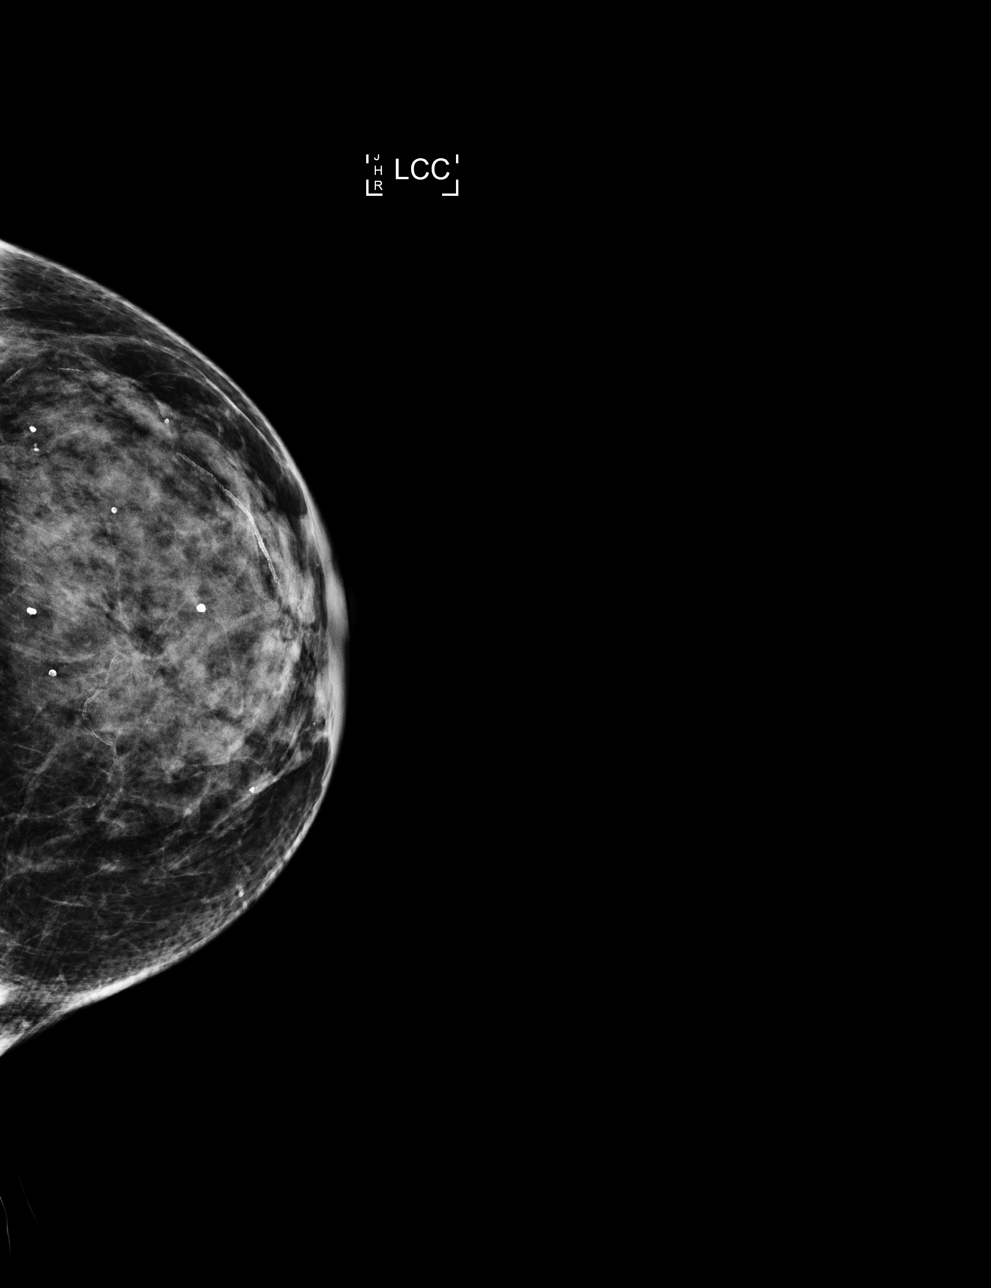

[L MLO]
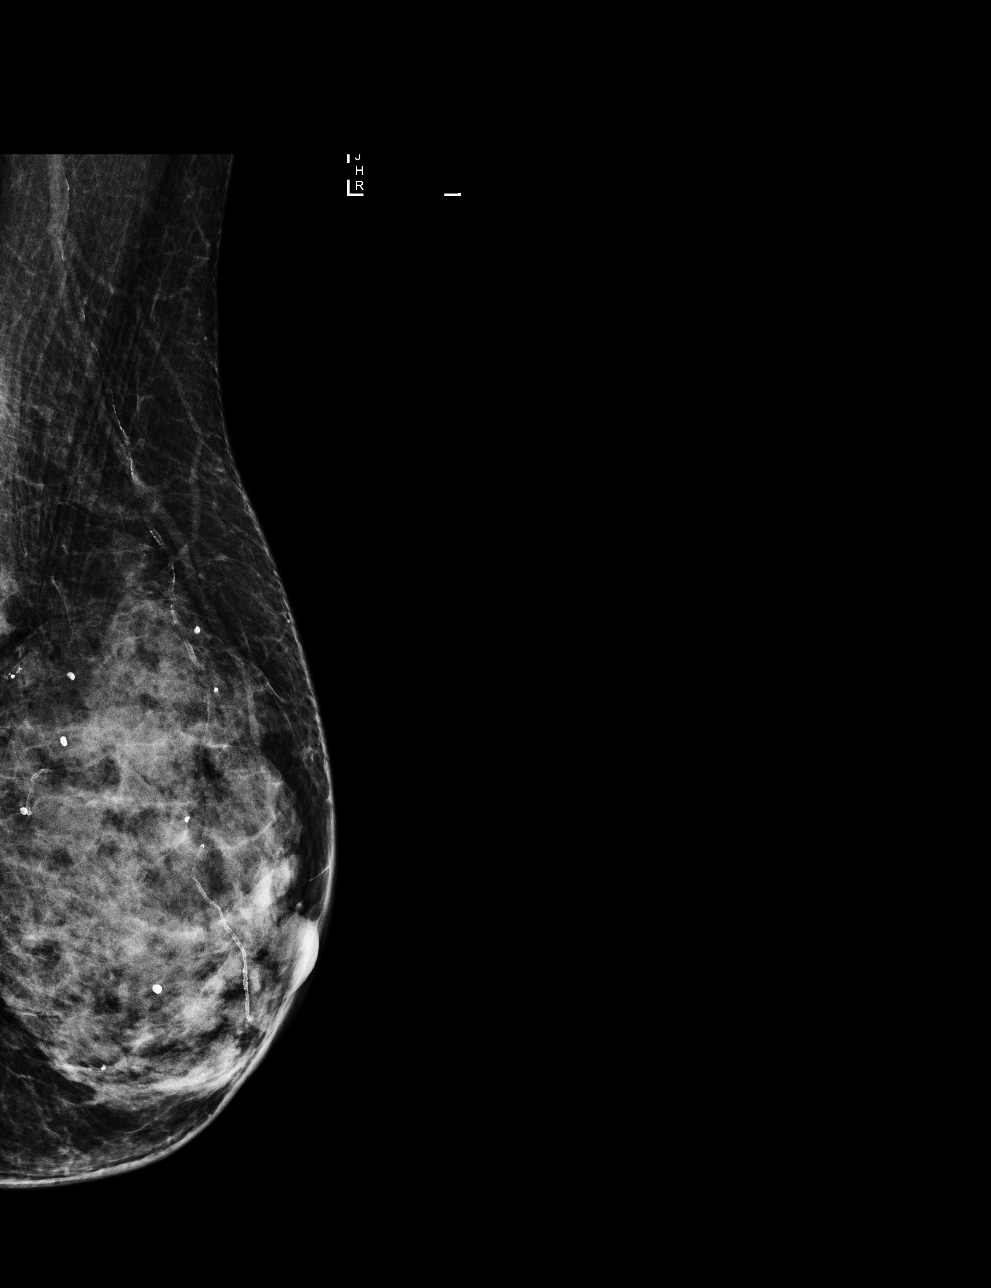

[4 of 4 positions shown; findings below may reference images not displayed]

ACR Breast Density Category d: The breast tissue is extremely dense,
which lowers the sensitivity of mammography.
FINDINGS: There are no findings suspicious for malignancy. Images were
processed with CAD.
IMPRESSION: No mammographic evidence of malignancy. A result letter of this
screening mammogram will be mailed directly to the patient.

RECOMMENDATION:
Screening mammogram in one year. (Code:[id number])

BI-RADS CATEGORY  1: Negative.

## 2018-06-25 ENCOUNTER — Other Ambulatory Visit: Payer: Self-pay | Admitting: Internal Medicine

## 2018-06-25 DIAGNOSIS — Z1231 Encounter for screening mammogram for malignant neoplasm of breast: Secondary | ICD-10-CM

## 2018-07-05 DIAGNOSIS — I6521 Occlusion and stenosis of right carotid artery: Secondary | ICD-10-CM | POA: Diagnosis not present

## 2018-08-02 ENCOUNTER — Ambulatory Visit
Admission: RE | Admit: 2018-08-02 | Discharge: 2018-08-02 | Disposition: A | Discharge status: Home / Self Care | Payer: Medicare Other | Source: Ambulatory Visit | Attending: Internal Medicine | Admitting: Internal Medicine

## 2018-08-02 DIAGNOSIS — Z1231 Encounter for screening mammogram for malignant neoplasm of breast: Secondary | ICD-10-CM | POA: Diagnosis not present

## 2018-09-11 ENCOUNTER — Other Ambulatory Visit: Payer: Self-pay | Admitting: Cardiovascular Disease

## 2018-09-19 ENCOUNTER — Other Ambulatory Visit (HOSPITAL_COMMUNITY): Payer: Self-pay | Admitting: *Deleted

## 2018-09-20 ENCOUNTER — Ambulatory Visit (HOSPITAL_COMMUNITY)
Admission: RE | Admit: 2018-09-20 | Discharge: 2018-09-20 | Disposition: A | Discharge status: Home / Self Care | Payer: Medicare Other | Source: Ambulatory Visit | Attending: Internal Medicine | Admitting: Internal Medicine

## 2018-09-20 DIAGNOSIS — M810 Age-related osteoporosis without current pathological fracture: Secondary | ICD-10-CM | POA: Diagnosis not present

## 2018-09-20 MED ORDER — DENOSUMAB 60 MG/ML ~~LOC~~ SOSY
60.0000 mg | PREFILLED_SYRINGE | Freq: Once | SUBCUTANEOUS | Status: AC
  Administered 2018-09-20: 60 mg via SUBCUTANEOUS

## 2018-09-20 MED ORDER — DENOSUMAB 60 MG/ML ~~LOC~~ SOSY
PREFILLED_SYRINGE | SUBCUTANEOUS | Status: AC
  Filled 2018-09-20: qty 1

## 2018-11-01 ENCOUNTER — Other Ambulatory Visit: Payer: Self-pay | Admitting: Cardiovascular Disease

## 2018-11-13 DIAGNOSIS — T8484XA Pain due to internal orthopedic prosthetic devices, implants and grafts, initial encounter: Secondary | ICD-10-CM | POA: Diagnosis not present

## 2018-11-13 DIAGNOSIS — Z96651 Presence of right artificial knee joint: Secondary | ICD-10-CM | POA: Diagnosis not present

## 2018-11-15 ENCOUNTER — Other Ambulatory Visit: Payer: Self-pay | Admitting: Orthopedic Surgery

## 2018-11-15 DIAGNOSIS — M545 Low back pain, unspecified: Secondary | ICD-10-CM

## 2018-11-26 ENCOUNTER — Ambulatory Visit
Admission: RE | Admit: 2018-11-26 | Discharge: 2018-11-26 | Disposition: A | Discharge status: Home / Self Care | Payer: Medicare Other | Source: Ambulatory Visit | Attending: Orthopedic Surgery | Admitting: Orthopedic Surgery

## 2018-11-26 ENCOUNTER — Other Ambulatory Visit: Payer: Self-pay

## 2018-11-26 DIAGNOSIS — M545 Low back pain, unspecified: Secondary | ICD-10-CM

## 2018-11-26 DIAGNOSIS — M48061 Spinal stenosis, lumbar region without neurogenic claudication: Secondary | ICD-10-CM | POA: Diagnosis not present

## 2018-12-11 DIAGNOSIS — M79604 Pain in right leg: Secondary | ICD-10-CM | POA: Diagnosis not present

## 2018-12-11 DIAGNOSIS — M48061 Spinal stenosis, lumbar region without neurogenic claudication: Secondary | ICD-10-CM | POA: Diagnosis not present

## 2018-12-28 ENCOUNTER — Other Ambulatory Visit: Payer: Medicare Other

## 2019-01-07 DIAGNOSIS — D225 Melanocytic nevi of trunk: Secondary | ICD-10-CM | POA: Diagnosis not present

## 2019-01-07 DIAGNOSIS — L570 Actinic keratosis: Secondary | ICD-10-CM | POA: Diagnosis not present

## 2019-01-07 DIAGNOSIS — L821 Other seborrheic keratosis: Secondary | ICD-10-CM | POA: Diagnosis not present

## 2019-01-07 DIAGNOSIS — D224 Melanocytic nevi of scalp and neck: Secondary | ICD-10-CM | POA: Diagnosis not present

## 2019-01-07 DIAGNOSIS — D2261 Melanocytic nevi of right upper limb, including shoulder: Secondary | ICD-10-CM | POA: Diagnosis not present

## 2019-01-14 DIAGNOSIS — M48061 Spinal stenosis, lumbar region without neurogenic claudication: Secondary | ICD-10-CM | POA: Diagnosis not present

## 2019-01-14 DIAGNOSIS — M79604 Pain in right leg: Secondary | ICD-10-CM | POA: Diagnosis not present

## 2019-01-15 ENCOUNTER — Telehealth: Payer: Self-pay

## 2019-01-15 NOTE — Telephone Encounter (Signed)
   [facility] Pre-operative Risk Assessment    Request for surgical clearance:  1. What type of surgery is being performed? Lumbar Spine Transforaminal   2. When is this surgery scheduled? TBD   3. What type of clearance is required (medical clearance vs. Pharmacy clearance to hold med vs. Both)? BOTH  4. Are there any medications that need to be held prior to surgery and how long? Xarelto, 3 days prior   5. Practice name and name of physician performing surgery? [facility], Dr. Brien Few  6. What is your office phone number: [phone]    7.   What is your office fax number: [phone]  8.   Anesthesia type (None, local, MAC, general) ? Unspecified    Monica Williamson 01/15/2019, 5:08 PM  _________________________________________________________________   (provider comments below)

## 2019-01-16 NOTE — Telephone Encounter (Signed)
   Primary Cardiologist: Mertie Moores, MD  Chart reviewed and patient contacted by phone today as part of pre-operative protocol coverage. Given past medical history and time since last visit, based on ACC/AHA guidelines, Monica Williamson would be at acceptable risk for the planned procedure without further cardiovascular testing.   OK to hold Xarelto 3 days pre op if needed.  I will route this recommendation to the requesting party via Epic fax function and remove from pre-op pool.  Please call with questions.  Kerin Ransom, PA-C 01/16/2019, 3:01 PM

## 2019-01-16 NOTE — Telephone Encounter (Signed)
Pt takes Xarelto for afib with CHADS2VASc score of 5 (age x2, sex, HTN, CAD). SCr 1 at [facility] on 06/05/18, CrCl 76mL/min, pt appropriately on Xarelto 15mg  dosing. Ok to hold Xarelto for 3 days prior to spinal procedure as requested.

## 2019-01-24 ENCOUNTER — Other Ambulatory Visit: Payer: Self-pay | Admitting: Physical Medicine and Rehabilitation

## 2019-01-24 DIAGNOSIS — M48061 Spinal stenosis, lumbar region without neurogenic claudication: Secondary | ICD-10-CM

## 2019-01-30 DIAGNOSIS — Z20828 Contact with and (suspected) exposure to other viral communicable diseases: Secondary | ICD-10-CM | POA: Diagnosis not present

## 2019-02-08 DIAGNOSIS — Z20828 Contact with and (suspected) exposure to other viral communicable diseases: Secondary | ICD-10-CM | POA: Diagnosis not present

## 2019-02-20 DIAGNOSIS — M48061 Spinal stenosis, lumbar region without neurogenic claudication: Secondary | ICD-10-CM | POA: Diagnosis not present

## 2019-03-08 ENCOUNTER — Telehealth: Payer: Self-pay | Admitting: *Deleted

## 2019-03-08 DIAGNOSIS — E559 Vitamin D deficiency, unspecified: Secondary | ICD-10-CM | POA: Diagnosis not present

## 2019-03-08 DIAGNOSIS — E7849 Other hyperlipidemia: Secondary | ICD-10-CM | POA: Diagnosis not present

## 2019-03-08 DIAGNOSIS — Z23 Encounter for immunization: Secondary | ICD-10-CM | POA: Diagnosis not present

## 2019-03-08 DIAGNOSIS — I10 Essential (primary) hypertension: Secondary | ICD-10-CM | POA: Diagnosis not present

## 2019-03-08 NOTE — Telephone Encounter (Signed)
   [facility] Pre-operative Risk Assessment    Request for surgical clearance:  1. What type of surgery is being performed? LUMBAR SPINE INJECTION   2. When is this surgery scheduled? TBD   3. What type of clearance is required (medical clearance vs. Pharmacy clearance to hold med vs. Both)? BOTH  4. Are there any medications that need to be held prior to surgery and how long? XARELTO X 3 DAYS PRIOR TO INJECTION   5. Practice name and name of physician performing surgery? [facility]; DR. Gwenlyn Perking [facility]   6. What is your office phone number [phone]    7.   What is your office fax number [phone]  8.   Anesthesia type (None, local, MAC, general) ? NONE LISTED. LOCAL?   Monica Williamson 03/08/2019, 11:24 AM  _________________________________________________________________   (provider comments below)

## 2019-03-08 NOTE — Telephone Encounter (Signed)
Patient with diagnosis of afib on Xarelto for anticoagulation.    Procedure: LUMBAR SPINE INJECTION  Date of procedure: TBD  CHADS2-VASc score of  5 (HTN, AGE, CAD, AGE, female)  CrCl 42 ml/min  Per office protocol, patient can hold Xarelto for 3 days prior to procedure.

## 2019-03-08 NOTE — Telephone Encounter (Signed)
   Primary Cardiologist: Mertie Moores, MD  Chart reviewed as part of pre-operative protocol coverage. Patient was contacted 03/08/2019 in reference to pre-operative risk assessment for pending surgery as outlined below.  Monica Williamson was last seen on 03/15/2018 by Dr. Acie Fredrickson.  Since that day, TYLEISHA SHAH has done well from a cardiac standpoint. She walks her dog .75 miles 2x per day and denies any chest pain or SOB with that activity. No complaints of palpitations.   Therefore, based on ACC/AHA guidelines, the patient would be at acceptable risk for the planned procedure without further cardiovascular testing.   Per prior pharmacy recommendations, patient can hold xarelto 3 days prior to her upcoming spinal injection. She should restart xarelto when cleared to do so by her surgeon.   I will route this recommendation to the requesting party via Epic fax function and remove from pre-op pool.  Please call with questions.  Abigail Butts, PA-C 03/08/2019, 12:34 PM

## 2019-03-10 ENCOUNTER — Other Ambulatory Visit: Payer: Self-pay | Admitting: Cardiovascular Disease

## 2019-03-11 DIAGNOSIS — I10 Essential (primary) hypertension: Secondary | ICD-10-CM | POA: Diagnosis not present

## 2019-03-11 DIAGNOSIS — M8589 Other specified disorders of bone density and structure, multiple sites: Secondary | ICD-10-CM | POA: Diagnosis not present

## 2019-03-11 NOTE — Telephone Encounter (Signed)
Prescription refill request for Xarelto received.   Last office visit: Dr. Acie Fredrickson (03-15-2018) Weight: 68 kg Age: 78 y.o. Scr: 1.1 (03-08-2019) CrCl: 44 ml/min

## 2019-03-15 DIAGNOSIS — N183 Chronic kidney disease, stage 3 (moderate): Secondary | ICD-10-CM | POA: Diagnosis not present

## 2019-03-15 DIAGNOSIS — M8580 Other specified disorders of bone density and structure, unspecified site: Secondary | ICD-10-CM | POA: Diagnosis not present

## 2019-03-15 DIAGNOSIS — Z1331 Encounter for screening for depression: Secondary | ICD-10-CM | POA: Diagnosis not present

## 2019-03-15 DIAGNOSIS — I480 Paroxysmal atrial fibrillation: Secondary | ICD-10-CM | POA: Diagnosis not present

## 2019-03-15 DIAGNOSIS — Z1339 Encounter for screening examination for other mental health and behavioral disorders: Secondary | ICD-10-CM | POA: Diagnosis not present

## 2019-03-15 DIAGNOSIS — I10 Essential (primary) hypertension: Secondary | ICD-10-CM | POA: Diagnosis not present

## 2019-03-15 DIAGNOSIS — J479 Bronchiectasis, uncomplicated: Secondary | ICD-10-CM | POA: Diagnosis not present

## 2019-03-15 DIAGNOSIS — F418 Other specified anxiety disorders: Secondary | ICD-10-CM | POA: Diagnosis not present

## 2019-03-15 DIAGNOSIS — I6523 Occlusion and stenosis of bilateral carotid arteries: Secondary | ICD-10-CM | POA: Diagnosis not present

## 2019-03-15 DIAGNOSIS — E559 Vitamin D deficiency, unspecified: Secondary | ICD-10-CM | POA: Diagnosis not present

## 2019-03-15 DIAGNOSIS — J45909 Unspecified asthma, uncomplicated: Secondary | ICD-10-CM | POA: Diagnosis not present

## 2019-03-15 DIAGNOSIS — Z0000 Encounter for general adult medical examination without abnormal findings: Secondary | ICD-10-CM | POA: Diagnosis not present

## 2019-03-15 DIAGNOSIS — E785 Hyperlipidemia, unspecified: Secondary | ICD-10-CM | POA: Diagnosis not present

## 2019-03-15 DIAGNOSIS — Z7901 Long term (current) use of anticoagulants: Secondary | ICD-10-CM | POA: Diagnosis not present

## 2019-03-26 DIAGNOSIS — M48061 Spinal stenosis, lumbar region without neurogenic claudication: Secondary | ICD-10-CM | POA: Diagnosis not present

## 2019-03-28 NOTE — Progress Notes (Signed)
Cardiology Office Note   Date:  03/28/2019   ID:  Monica Williamson, DOB [birth year], MRN [id number]  PCP:  Marton Redwood, MD  Cardiologist:   Mertie Moores, MD   No chief complaint on file.     Monica Williamson is a 78 y.o. female who presents for follow-up of her paroxysmal atrial fibrillation.  She has a CHADS VASC 2 score of 3 and is on Xarelto .   She has had both  knees replaced.  She has had only 2 episodes of PAF - resolved very quickly with the propranolol.   Doing well.   Jan. 27, 2017:  Jan. 29, 2018:  Monica Williamson is seen today for follow up of her PAF .  Has had 3 episodes of PAF in December.  Takes a propranolol with resolution ( 1-2 tabs )  Typically occurs at night ,  Lasts perhaps 30 minutes.   Aug. 29, 2018:  Monica Williamson is doing well Has had several episodes of paroxysmal atrial fib.  Doing well Leaving for [place] in 2 weeks.    Aug. 29, 2019 Monica Williamson is seen today for follow-up of her paroxysmal atrial fibrillation.   Monica Williamson came to her appt today .   I last saw her in August, 2018.  She saw Monica Williamson, [place] in January, 2019 for some palpitations.  She is found to be in normal sinus rhythm and was reassured.  She is been on Xarelto.  Diltiazem seems to control her atrial fibrillation fairly well.  BP has been quite variable recently - has had low diastolic BP readings. Saw Dr. Brigitte Pulse.    Was having some dizziness.   Also had ringing in her ears.   con  HCTZ was stopped.   Now on Valsartan. LDL was found to be slightly elevated - 85.   Was started on Zetia.  Scheduled to have repeat lipids drawn in November. Has had more palpitations.  Takes propranolol with resolution of her symptoms  Thinks she is having more atiral fib.  She cannot really feel any palpitations - just gets a notification when her BP says she has an irregular HR   Has moderate carotid disease.  She has been seen by the vein and vascular specialist.  She is scheduled for repeat  duplex scan in November.  Sept. 11, 2020   Monica Williamson is seen today for follow up of her PAF, hyperlipidemia   She is had right carotid endarterectomy since I last saw her. She is in atrial fib today .   Cannot feel the HR irregularity .    Past Medical History:  Diagnosis Date  . Anxiety   . Arthritis   . Atrial fibrillation ([place])    when takes Warfarin  . Atrial fibrillation, currently in sinus rhythm   . Bronchiectasis ([place])    contolled with lovent and allergy med  . Childhood asthma   . Cholelithiasis   . Colon polyps   . Complication of anesthesia   . Depression   . Diverticulosis   . Dysrhythmia    sees Dr Acie Fredrickson annually for h/o Afib  . H/O bronchiectasis   . History of airborne allergies    uses inhalers  . Hypertension   . Nocturia   . Osteopenia   . PONV (postoperative nausea and vomiting)    severe n/v post anesthesia   . Pulmonary nodule   . Sleep related teeth grinding    wears a mouth guard at night  . Stress fracture of  tibia 2014  . Vitamin D deficiency     Past Surgical History:  Procedure Laterality Date  . BREAST CYST EXCISION Right    over 20 years ago  . BREAST SURGERY    . CHOLECYSTECTOMY     11/2010  . INCISION AND DRAINAGE BREAST ABSCESS    . JOINT REPLACEMENT Right 09/2013   knee  . MENISCUS REPAIR Right 2014  . 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   Procedure: RIGHT TOTAL KNEE ARTHROPLASTY;  Surgeon: Vickey Huger, MD;  Location: [facility];  Service: Orthopedics;  Laterality: Right;  . 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 TOTAL KNEE ARTHROPLASTY Left 02/24/2014   Procedure: TOTAL KNEE ARTHROPLASTY;  Surgeon: Vickey Huger, MD;  Location: [facility];  Service: Orthopedics;  Laterality: Left;  [name] VAGINAL HYSTERECTOMY    . VESICOVAGINAL FISTULA CLOSURE W/ TAH       Current Outpatient Medications  Medication Sig Dispense Refill  . acetaminophen (TYLENOL) 500 MG tablet Take 1,000 mg by mouth every 6 (six) hours as needed for moderate  pain or headache.    . albuterol (PROVENTIL HFA;VENTOLIN HFA) 108 (90 Base) MCG/ACT inhaler Inhale 2 puffs into the lungs 2 (two) times daily as needed for wheezing or shortness of breath. 1 Inhaler 5  . ALPRAZolam (XANAX) 0.5 MG tablet Take 0.25 mg by mouth at bedtime as needed for anxiety or sleep.     [name] atorvastatin (LIPITOR) 40 MG tablet Take 40 mg by mouth daily at 6 PM.     . benzonatate (TESSALON) 100 MG capsule Take 1 capsule by mouth every 8 (eight) hours as needed for cough.     [name] buPROPion (WELLBUTRIN XL) 150 MG 24 hr tablet Take 150 mg by mouth every morning.  30 tablet 11  . Calcium Carbonate-Vit D-Min (CALCIUM 1200) 1200-1000 MG-UNIT CHEW Chew 1 tablet by mouth every morning.     [name] DILT-XR 180 MG 24 hr capsule TAKE 1 CAPSULE DAILY 90 capsule 3  . ezetimibe (ZETIA) 10 MG tablet Take 1 tablet by mouth daily as needed.    . fluticasone (FLONASE) 50 MCG/ACT nasal spray Place 2 sprays into both nostrils daily. 16 g 5  . fluticasone (FLOVENT HFA) 110 MCG/ACT inhaler Inhale 2 puffs into the lungs 2 (two) times daily as needed (for shortness of breath). 1 Inhaler 5  . metroNIDAZOLE (FLAGYL) 250 MG tablet Take 1 tablet (250 mg total) by mouth 4 (four) times daily. 40 tablet 2  . propranolol (INDERAL) 10 MG tablet Take 1 tablet (10 mg total) by mouth 4 (four) times daily as needed (FOR PALPITATIONS Can take ONE every 30 minutes Up to 4 Doses). 60 tablet 11  . valsartan (DIOVAN) 320 MG tablet Take 1 tablet by mouth daily.    Alveda Reasons 15 MG TABS tablet TAKE 1 TABLET DAILY WITH SUPPER 90 tablet 1   No current facility-administered medications for this visit.     Allergies:   Warfarin and related, Augmentin [amoxicillin-pot clavulanate], and Avelox [moxifloxacin]    Social History:  The patient  reports that she quit smoking about 40 years ago. She has a 15.00 pack-year smoking history. She has never used smokeless tobacco. She reports that she does not use drugs.   Family History:  The  patient's family history includes Heart disease in her sister.    ROS:  Please see the history of present illness.   Otherwise, review of systems are positive for none.   All other  systems are reviewed and negative.   Physical Exam: There were no vitals taken for this visit.  GEN:  Well nourished, well developed in no acute distress HEENT: Normal NECK: No JVD;  R CEA scar  LYMPHATICS: No lymphadenopathy CARDIAC:   Irreg. Irreg.   RESPIRATORY:  Clear to auscultation without rales, wheezing or rhonchi  ABDOMEN: Soft, non-tender, non-distended MUSCULOSKELETAL:  No edema; No deformity  SKIN: Warm and dry NEUROLOGIC:  Alert and oriented x 3   Recent Labs: No results found for requested labs within last 8760 hours.    Lipid Panel No results found for: CHOL, TRIG, HDL, CHOLHDL, VLDL, LDLCALC, LDLDIRECT    Wt Readings from Last 3 Encounters:  05/18/18 150 lb (68 kg)  03/15/18 150 lb (68 kg)  12/06/17 148 lb (67.1 kg)      Other studies Reviewed:  EKG:  [name]   September [place], 2020: Atrial fibrillation at a rate of 97.  Right bundle branch block.   ASSESSMENT AND PLAN:  1.  1. Paroxysmal atrial fibrillation   : She is back in atrial fibrillation today.  She cannot tell that she is in A. fib.  We discussed getting a cardio monitor for further monitoring of her atrial fibrillation.  She likes the idea.  She will discuss it with her husband.  2.   Hyperlipidemia:   -Followed by Dr. Brigitte Pulse.  3. Carotid artery disease -she is status post right carotid endarterectomy.  They are still following her every 6 months..      Current medicines are reviewed at length with the patient today.  The patient does not have concerns regarding medicines.  The following changes have been made:  no change  Labs/ tests ordered today include: ECG   No orders of the defined types were placed in this encounter.   Disposition:   FU with me  in 1 year.    Signed, Mertie Moores, MD  03/28/2019 8:50  PM    [facility] [place], [place], [place]  [address] Phone: [phone]; Fax: [phone]

## 2019-03-29 ENCOUNTER — Other Ambulatory Visit: Payer: Self-pay

## 2019-03-29 ENCOUNTER — Ambulatory Visit (INDEPENDENT_AMBULATORY_CARE_PROVIDER_SITE_OTHER): Payer: Medicare Other | Admitting: Cardiovascular Disease

## 2019-03-29 ENCOUNTER — Encounter: Payer: Self-pay | Admitting: Cardiovascular Disease

## 2019-03-29 VITALS — BP 128/72 | HR 97 | Ht 66.0 in | Wt 153.0 lb

## 2019-03-29 DIAGNOSIS — I6523 Occlusion and stenosis of bilateral carotid arteries: Secondary | ICD-10-CM | POA: Diagnosis not present

## 2019-03-29 DIAGNOSIS — I480 Paroxysmal atrial fibrillation: Secondary | ICD-10-CM | POA: Diagnosis not present

## 2019-03-29 NOTE — Patient Instructions (Addendum)
[employer]  $89 on [place] or other websites.    App on Iphone is free   Medication Instructions:  Your physician recommends that you continue on your current medications as directed. Please refer to the Current Medication list given to you today.  If you need a refill on your cardiac medications before your next appointment, please call your pharmacy.   Lab work: None Ordered   Testing/Procedures: None Ordered  Follow-Up: At [facility], you and your health needs are our priority.  As part of our continuing mission to provide you with exceptional heart care, we have created designated Provider Care Teams.  These Care Teams include your primary Cardiologist (physician) and Advanced Practice Providers (APPs -  Physician Assistants and Nurse Practitioners) who all work together to provide you with the care you need, when you need it. You will need a follow up appointment in:  1 years.  Please call our office 2 months in advance to schedule this appointment.  You may see Mertie Moores, MD or one of the following Advanced Practice Providers on your designated Care Team: Richardson Dopp, PA-C [place], [place] . Daune Perch, NP

## 2019-04-01 ENCOUNTER — Telehealth: Payer: Self-pay | Admitting: Cardiovascular Disease

## 2019-04-01 NOTE — Telephone Encounter (Signed)
Patient saw Dr. Acie Fredrickson on Friday, she said she got a cardio monitor as discussed with, she was still in A-Fib this morning.  She would like to talk to Dr. Elmarie Shiley nurse.

## 2019-04-01 NOTE — Telephone Encounter (Signed)
Spoke with patient to discuss dose of Xarelto. She verified that the dose she has at home is Xarelto 15 mg. She states she had lab work done at Dr. Raul Del office 2 weeks and she remembers discussing something about her kidney function but she does not have copies of the results.  I discussed with Dr. Acie Fredrickson and he asked that she call tomorrow morning and have results faxed to our office. He will advise on correct dose once those results are received. She verbalized understanding and agreement with plan and thanked me for the call.

## 2019-04-01 NOTE — Telephone Encounter (Signed)
Spoke with patient who states she got the [place] monitor as advised by Dr. Acie Fredrickson. States last night had a feeling like a fib and checked monitor which showed a fib. States she took a propranolol 10 mg and went to bed. Woke up this morning and monitor told her she was still in a fib so she took another propranolol. States she feels fine and her BP cuff is not noting irregular HR but monitor states she is still in a fib. HR 80's bpm She continues to take diltiazem as directed. I advised her that if she feels well and HR is not fast, to continue to monitor and use propranolol 10 mg up to 4 times daily as needed. I gave her instructions on uploading a monitor strip to MyChart and she states she is going to get her granddaughter to help her. I advised her to call back with further questions or concerns and she thanked me for the call.

## 2019-04-01 NOTE — Telephone Encounter (Signed)
Pt just got a Kardia monitor and is having episodes of PAF She is on Xarelto 15 mg a day . By my calculation, her CrCl is 70 so she should be on Xarelto 20 mg a day  Will increase her dose .   She has propranolol and Dilt to take as needed for her PAF .

## 2019-04-02 ENCOUNTER — Other Ambulatory Visit (HOSPITAL_COMMUNITY): Payer: Self-pay

## 2019-04-02 ENCOUNTER — Ambulatory Visit: Payer: Medicare Other | Admitting: Cardiovascular Disease

## 2019-04-02 NOTE — Telephone Encounter (Signed)
Spoke with pt and made her aware of recommendations.  Pt verbalized understanding.  Pt mentioned that she did get the [facility] as recommended.  She sent over a strip through MyChart this morning.  Says possible Afib.  Pt did not take a Propanolol.  States she knows when she is in Afib because she feels a pounding.  Does not feel that she is in Afib at this time. Advised I will make Dr. Acie Fredrickson aware and have him review the strip that was sent over.

## 2019-04-02 NOTE — Telephone Encounter (Signed)
Using the most recent lab data from Aug. 21, 2020 Her Creatinine is 1.1 mg/dl Her creatinine clearence ( by [place])  Is 44.69  So Xarelto 15 mg at night ( with her largest meal of the day ) is the correct dose

## 2019-04-03 ENCOUNTER — Ambulatory Visit (HOSPITAL_COMMUNITY)
Admission: RE | Admit: 2019-04-03 | Discharge: 2019-04-03 | Disposition: A | Discharge status: Home / Self Care | Payer: Medicare Other | Source: Ambulatory Visit | Attending: Internal Medicine | Admitting: Internal Medicine

## 2019-04-03 ENCOUNTER — Other Ambulatory Visit: Payer: Self-pay

## 2019-04-03 DIAGNOSIS — M810 Age-related osteoporosis without current pathological fracture: Secondary | ICD-10-CM | POA: Diagnosis not present

## 2019-04-03 MED ORDER — DENOSUMAB 60 MG/ML ~~LOC~~ SOSY
PREFILLED_SYRINGE | SUBCUTANEOUS | Status: AC
  Filled 2019-04-03: qty 1

## 2019-04-03 MED ORDER — DENOSUMAB 60 MG/ML ~~LOC~~ SOSY
60.0000 mg | PREFILLED_SYRINGE | Freq: Once | SUBCUTANEOUS | Status: AC
  Administered 2019-04-03: 60 mg via SUBCUTANEOUS

## 2019-04-04 DIAGNOSIS — H2513 Age-related nuclear cataract, bilateral: Secondary | ICD-10-CM | POA: Diagnosis not present

## 2019-04-04 DIAGNOSIS — H43813 Vitreous degeneration, bilateral: Secondary | ICD-10-CM | POA: Diagnosis not present

## 2019-04-04 DIAGNOSIS — H31093 Other chorioretinal scars, bilateral: Secondary | ICD-10-CM | POA: Diagnosis not present

## 2019-04-04 DIAGNOSIS — H4322 Crystalline deposits in vitreous body, left eye: Secondary | ICD-10-CM | POA: Diagnosis not present

## 2019-04-11 ENCOUNTER — Telehealth: Payer: Self-pay | Admitting: Nurse Practitioner

## 2019-04-11 MED ORDER — METOPROLOL TARTRATE 25 MG PO TABS: 25.0000 mg | ORAL_TABLET | Freq: Two times a day (BID) | ORAL | 3 refills | Status: DC

## 2019-04-11 NOTE — Telephone Encounter (Signed)
Prescription for Metoprolol tartrate 25 mg BID sent to patient's pharmacy per request from Dr. Acie Fredrickson:   Sharyn Lull,   Please send in a script for metoprolol 25 mg po BID .  She may still use the propranolol as needed.

## 2019-04-23 DIAGNOSIS — I480 Paroxysmal atrial fibrillation: Secondary | ICD-10-CM | POA: Diagnosis not present

## 2019-04-23 DIAGNOSIS — Z1331 Encounter for screening for depression: Secondary | ICD-10-CM | POA: Diagnosis not present

## 2019-04-23 DIAGNOSIS — D6869 Other thrombophilia: Secondary | ICD-10-CM | POA: Diagnosis not present

## 2019-04-23 DIAGNOSIS — I10 Essential (primary) hypertension: Secondary | ICD-10-CM | POA: Diagnosis not present

## 2019-05-15 ENCOUNTER — Encounter (HOSPITAL_COMMUNITY): Payer: Medicare Other

## 2019-05-21 NOTE — Progress Notes (Signed)
Cardiology Office Note   Date:  05/22/2019   ID:  Monica Williamson, DOB 11/07/1940, MRN [id number]  PCP:  Marton Redwood, MD  Cardiologist:   Mertie Moores, MD   Chief Complaint  Patient presents with  . Atrial Fibrillation      Monica Williamson is a 78 y.o. female who presents for follow-up of her paroxysmal atrial fibrillation.  She has a CHADS VASC 2 score of 3 and is on Xarelto .   She has had both  knees replaced.  She has had only 2 episodes of PAF - resolved very quickly with the propranolol.   Doing well.   Jan. 27, 2017:  Jan. 29, 2018:  Monica Williamson is seen today for follow up of her PAF .  Has had 3 episodes of PAF in December.  Takes a propranolol with resolution ( 1-2 tabs )  Typically occurs at night ,  Lasts perhaps 30 minutes.   Aug. 29, 2018:  Monica Williamson is doing well Has had several episodes of paroxysmal atrial fib.  Doing well Leaving for [place] in 2 weeks.    Aug. 29, 2019 Monica Williamson is seen today for follow-up of her paroxysmal atrial fibrillation.   Monica Williamson came to her appt today .   I last saw her in August, 2018.  She saw Monica Williamson, [place] in January, 2019 for some palpitations.  She is found to be in normal sinus rhythm and was reassured.  She is been on Xarelto.  Diltiazem seems to control her atrial fibrillation fairly well.  BP has been quite variable recently - has had low diastolic BP readings. Saw Dr. Brigitte Pulse.    Was having some dizziness.   Also had ringing in her ears.   con  HCTZ was stopped.   Now on Valsartan. LDL was found to be slightly elevated - 85.   Was started on Zetia.  Scheduled to have repeat lipids drawn in November. Has had more palpitations.  Takes propranolol with resolution of her symptoms  Thinks she is having more atiral fib.  She cannot really feel any palpitations - just gets a notification when her BP says she has an irregular HR   Has moderate carotid disease.  She has been seen by the vein and vascular  specialist.  She is scheduled for repeat duplex scan in November.  Sept. 11, 2020   Monica Williamson is seen today for follow up of her PAF, hyperlipidemia   She is had right carotid endarterectomy since I last saw her. She is in atrial fib today .   Cannot feel the HR irregularity .   May 22, 2019: Monica Williamson is seen today for follow-up visit.  She has a history of paroxysmal atrial fibrillation.  She has mild hyperlipidemia.  She has moderate carotid disease and has had a right carotid endarterectomy.  Has gotten a Kardia monitor Has Afib frequently  Tried propranolol - did not seem toaffect the AF We started metoprolol ,  Is on xarelto  She held her xarelto back on       For a back injectin   Past Medical History:  Diagnosis Date  . Anxiety   . Arthritis   . Atrial fibrillation ([place])    when takes Warfarin  . Atrial fibrillation, currently in sinus rhythm   . Bronchiectasis ([place])    contolled with lovent and allergy med  . Childhood asthma   . Cholelithiasis   . Colon polyps   . Complication of anesthesia   .  Depression   . Diverticulosis   . Dysrhythmia    sees Dr Acie Fredrickson annually for h/o Afib  . H/O bronchiectasis   . History of airborne allergies    uses inhalers  . Hypertension   . Nocturia   . Osteopenia   . PONV (postoperative nausea and vomiting)    severe n/v post anesthesia   . Pulmonary nodule   . Sleep related teeth grinding    wears a mouth guard at night  . Stress fracture of tibia 2014  . Vitamin D deficiency     Past Surgical History:  Procedure Laterality Date  . BREAST CYST EXCISION Right    over 20 years ago  . BREAST SURGERY    . CHOLECYSTECTOMY     11/2010  . INCISION AND DRAINAGE BREAST ABSCESS    . JOINT REPLACEMENT Right 09/2013   knee  . MENISCUS REPAIR Right 2014  . 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   Procedure: RIGHT TOTAL KNEE ARTHROPLASTY;  Surgeon: Vickey Huger, MD;  Location: [facility];  Service: Orthopedics;  Laterality: Right;  . TOTAL  KNEE ARTHROPLASTY Left 02/24/2014   dr Ronnie Derby  . TOTAL KNEE ARTHROPLASTY Left 02/24/2014   Procedure: TOTAL KNEE ARTHROPLASTY;  Surgeon: Vickey Huger, MD;  Location: [facility];  Service: Orthopedics;  Laterality: Left;  [name] VAGINAL HYSTERECTOMY    . VESICOVAGINAL FISTULA CLOSURE W/ TAH       Current Outpatient Medications  Medication Sig Dispense Refill  . acetaminophen (TYLENOL) 500 MG tablet Take 1,000 mg by mouth every 6 (six) hours as needed for moderate pain or headache.    . albuterol (PROVENTIL HFA;VENTOLIN HFA) 108 (90 Base) MCG/ACT inhaler Inhale 2 puffs into the lungs 2 (two) times daily as needed for wheezing or shortness of breath. 1 Inhaler 5  . ALPRAZolam (XANAX) 0.5 MG tablet Take 0.25 mg by mouth at bedtime as needed for anxiety or sleep.     . Ascorbic Acid (VITAMIN C PO) Take 1 tablet by mouth daily.    [name] atorvastatin (LIPITOR) 40 MG tablet Take 40 mg by mouth daily at 6 PM.     . buPROPion (WELLBUTRIN XL) 150 MG 24 hr tablet Take 150 mg by mouth every morning.  30 tablet 11  . Cholecalciferol (VITAMIN D-1000 MAX ST) 25 MCG (1000 UT) tablet Take 1,000 Units by mouth daily.    [name] DILT-XR 180 MG 24 hr capsule TAKE 1 CAPSULE DAILY 90 capsule 3  . ezetimibe (ZETIA) 10 MG tablet Take 1 tablet by mouth daily as needed.    [name] Fexofenadine HCl (ALLERGY 24-HR PO) Take 1 tablet by mouth daily.    . fluticasone (FLONASE) 50 MCG/ACT nasal spray Place 2 sprays into both nostrils daily. 16 g 5  . fluticasone (FLOVENT HFA) 110 MCG/ACT inhaler Inhale 2 puffs into the lungs 2 (two) times daily as needed (for shortness of breath). 1 Inhaler 5  . Lactobacillus (PROBIOTIC ACIDOPHILUS PO) Take 1 capsule by mouth daily.    . metoprolol tartrate (LOPRESSOR) 25 MG tablet Take 1 tablet (25 mg total) by mouth 2 (two) times daily. 180 tablet 3  . propranolol (INDERAL) 10 MG tablet Take 1 tablet (10 mg total) by mouth 4 (four) times daily as needed (FOR PALPITATIONS Can take ONE every 30 minutes Up to 4 Doses).  60 tablet 11  . XARELTO 15 MG TABS tablet TAKE 1 TABLET DAILY WITH SUPPER 90 tablet 1   No current facility-administered medications for this visit.  Allergies:   Warfarin and related, Augmentin [amoxicillin-pot clavulanate], and Avelox [moxifloxacin]    Social History:  The patient  reports that she quit smoking about 40 years ago. She has a 15.00 pack-year smoking history. She has never used smokeless tobacco. She reports that she does not use drugs.   Family History:  The patient's family history includes Heart disease in her sister.    ROS:  Please see the history of present illness.   Otherwise, review of systems are positive for none.   All other systems are reviewed and negative.   Physical Exam: Blood pressure 118/66, pulse 90, height 5\' 7"  (1.702 m), weight 152 lb (68.9 kg), SpO2 98 %.  GEN:  Well nourished, well developed in no acute distress HEENT: Normal NECK: No JVD; Rigth CEA scar  LYMPHATICS: No lymphadenopathy CARDIAC::  Irreg. Irreg.  RESPIRATORY:  Clear to auscultation without rales, wheezing or rhonchi  ABDOMEN: Soft, non-tender, non-distended MUSCULOSKELETAL:  No edema; No deformity  SKIN: Warm and dry NEUROLOGIC:  Alert and oriented x 3  Recent Labs: No results found for requested labs within last 8760 hours.    Lipid Panel No results found for: CHOL, TRIG, HDL, CHOLHDL, VLDL, LDLCALC, LDLDIRECT    Wt Readings from Last 3 Encounters:  05/22/19 152 lb (68.9 kg)  03/29/19 153 lb (69.4 kg)  05/18/18 150 lb (68 kg)      Other studies Reviewed:  EKG:    ASSESSMENT AND PLAN:  1.  Atrial fibrillation: She now has rather persistent atrial fibrillation.  She has bought a cardio monitor and has verified that she is in atrial fibrillation all the time.  She is on Xarelto.  We will set her up for cardioversion on November 10.  She has had some back injections over the past several months.  She will verify that she has not had 1 of these back injections  in the past 3 to 4 weeks which would have necessitated stopping the Xarelto.:    2.   Hyperlipidemia:   -Continue current medications.  3. Carotid artery disease -she is status post right carotid endarterectomy.  No symptoms of stroke.  Current medicines are reviewed at length with the patient today.  The patient does not have concerns regarding medicines.  The following changes have been made:  no change  Labs/ tests ordered today include: ECG   No orders of the defined types were placed in this encounter.   Disposition:   FU with me  In 6 weeks.    Signed, Mertie Moores, MD  05/22/2019 10:49 AM    [facility] [place], [place], [place]  [address] Phone: [phone]; Fax: [phone]

## 2019-05-22 ENCOUNTER — Ambulatory Visit (INDEPENDENT_AMBULATORY_CARE_PROVIDER_SITE_OTHER): Payer: Medicare Other | Admitting: Cardiovascular Disease

## 2019-05-22 ENCOUNTER — Encounter: Payer: Self-pay | Admitting: Cardiovascular Disease

## 2019-05-22 ENCOUNTER — Other Ambulatory Visit: Payer: Self-pay

## 2019-05-22 VITALS — BP 118/66 | HR 90 | Ht 67.0 in | Wt 152.0 lb

## 2019-05-22 DIAGNOSIS — I480 Paroxysmal atrial fibrillation: Secondary | ICD-10-CM | POA: Diagnosis not present

## 2019-05-22 DIAGNOSIS — I6523 Occlusion and stenosis of bilateral carotid arteries: Secondary | ICD-10-CM | POA: Diagnosis not present

## 2019-05-22 DIAGNOSIS — Z7901 Long term (current) use of anticoagulants: Secondary | ICD-10-CM

## 2019-05-22 NOTE — Patient Instructions (Addendum)
Medication Instructions:  Your physician recommends that you continue on your current medications as directed. Please refer to the Current Medication list given to you today.  *If you need a refill on your cardiac medications before your next appointment, please call your pharmacy*  Lab Work: TODAY - CBC, BMET  If you have labs (blood work) drawn today and your tests are completely normal, you will receive your results only by: [name] MyChart Message (if you have MyChart) OR . A paper copy in the mail If you have any lab test that is abnormal or we need to change your treatment, we will call you to review the results.  Testing/Procedures: Your physician has requested that you have an echocardiogram. Echocardiography is a painless test that uses sound waves to create images of your heart. It provides your doctor with information about the size and shape of your heart and how well your heart's chambers and valves are working. This procedure takes approximately one hour. There are no restrictions for this procedure.  You are scheduled for a Cardioversion on _________with Dr. _________.  Please arrive at the [facility] (Main Entrance A) at [facility]: [address] [place], [place] [address] at ______ am/pm. (1 hour prior to procedure)   DIET: Nothing to eat or drink after midnight except a sip of water with medications (see medication instructions below)  Medication Instructions:  Continue your anticoagulant: Xarelto You will need to continue your anticoagulant after your procedure until  you are told by your Provider that it is safe to stop   You must have a responsible person to drive you home and stay in the waiting area during your procedure. Failure to do so could result in cancellation.  Bring your insurance cards.  *Special Note: Every effort is made to have your procedure done on time. Occasionally there are emergencies that occur at the hospital that may cause delays. Please  be patient if a delay does occur.    Follow-Up: At [facility], you and your health needs are our priority.  As part of our continuing mission to provide you with exceptional heart care, we have created designated Provider Care Teams.  These Care Teams include your primary Cardiologist (physician) and Advanced Practice Providers (APPs -  Physician Assistants and Nurse Practitioners) who all work together to provide you with the care you need, when you need it.  Your next appointment:   6 weeks  The format for your next appointment:   In Person  Provider:   You may see Mertie Moores, MD or one of the following Advanced Practice Providers on your designated Care Team:    Richardson Dopp, PA-C  [place], [place]  Daune Perch, [place]

## 2019-05-23 ENCOUNTER — Telehealth: Payer: Self-pay | Admitting: *Deleted

## 2019-05-23 LAB — BASIC METABOLIC PANEL
BUN/Creatinine Ratio: 23 (ref 12–28)
BUN: 23 mg/dL (ref 8–27)
CO2: 21 mmol/L (ref 20–29)
Calcium: 9.8 mg/dL (ref 8.7–10.3)
Chloride: 103 mmol/L (ref 96–106)
Creatinine, Ser: 0.99 mg/dL (ref 0.57–1.00)
GFR calc Af Amer: 62 mL/min/{1.73_m2} (ref >59)
GFR calc non Af Amer: 54 mL/min/{1.73_m2} — ABNORMAL LOW (ref >59)
Glucose: 92 mg/dL (ref 65–99)
Potassium: 5.1 mmol/L (ref 3.5–5.2)
Sodium: 140 mmol/L (ref 134–144)

## 2019-05-23 LAB — CBC
Hematocrit: 42.3 % (ref 34.0–46.6)
Hemoglobin: 14.2 g/dL (ref 11.1–15.9)
MCH: 32.3 pg (ref 26.6–33.0)
MCHC: 33.6 g/dL (ref 31.5–35.7)
MCV: 96 fL (ref 79–97)
Platelets: 309 10*3/uL (ref 150–450)
RBC: 4.39 x10E6/uL (ref 3.77–5.28)
RDW: 13.1 % (ref 11.7–15.4)
WBC: 8.6 10*3/uL (ref 3.4–10.8)

## 2019-05-23 NOTE — Telephone Encounter (Signed)
DPR ok to s/w pt's husband who has been notified of pt's lab results and ok to proceed with DCCV. Pt's husband states pt sent a message to Dr. Acie Fredrickson this morning in regards to DCCV. He states pt says she does not feel comfortable going to the hospital with Covid and would prefer to post pone DCCV until after the first of the year. He states they have their grandchild living with them right now who has a compromised immune system, due to spine and lung problems and they will have her until January 2021. I assured pt's husband that I will let Dr. Acie Fredrickson and his nurse Sharyn Lull know of our conversation today. Pt's husband thanked me for the call. The patient has been notified of the result and verbalized understanding.  All questions (if any) were answered. Julaine Hua, CMA 05/23/2019 11:18 AM

## 2019-05-23 NOTE — Telephone Encounter (Signed)
-----   Message from Thayer Headings, MD sent at 05/23/2019  8:20 AM EST ----- Labs are stable . Ok for cardioversion

## 2019-05-24 NOTE — Telephone Encounter (Signed)
Per patient request her cardioversion has been rescheduled for 12/10. I advised patient to call back to reschedule if this date does not work well for her.

## 2019-05-30 ENCOUNTER — Ambulatory Visit (HOSPITAL_COMMUNITY): Payer: Medicare Other | Attending: Cardiology

## 2019-05-30 ENCOUNTER — Other Ambulatory Visit: Payer: Self-pay

## 2019-05-30 DIAGNOSIS — I480 Paroxysmal atrial fibrillation: Secondary | ICD-10-CM | POA: Diagnosis not present

## 2019-06-03 ENCOUNTER — Telehealth: Payer: Self-pay | Admitting: Nurse Practitioner

## 2019-06-03 DIAGNOSIS — Z7901 Long term (current) use of anticoagulants: Secondary | ICD-10-CM

## 2019-06-03 DIAGNOSIS — I480 Paroxysmal atrial fibrillation: Secondary | ICD-10-CM

## 2019-06-03 NOTE — Telephone Encounter (Signed)
-----   Message from Thayer Headings, MD sent at 05/31/2019  9:13 AM EST ----- Normal LV systolic function. Mild LA enlargement ( related to her mitral regurgitation) Mod. MR Continue with plans for cardioversion in the next month or so

## 2019-06-03 NOTE — Telephone Encounter (Addendum)
Called patient to review her echo results in response to MyChart message sent by patient. I reviewed each of the results and answered her questions and answered her husband's questions, who was listening on the other phone. She verbalized understanding of the results.  She has a concern about the results of the echo in regards to the plan for cardioversion and the number of Covid cases increasing. She does not want to be exposed to Covid while hospitalized and wonders why the cardioversion is urgent if the original plan from her office visit with Dr. Acie Fredrickson in September was to see her back in 6 months. She states only after she sent questions about metoprolol and remaining in a fib did she return to the office to see Dr. Acie Fredrickson again on Nov. 4 and have the echo ordered. Advised that changes from a fib and moderate MR do not have rapidly, these changes occur over months and years.  States she has a granddaughter staying with her who has a previous spinal cord injury and does not want to put her at risk for exposure to Covid. States she and her husband have been very isolated to avoid exposure.  I listened to the questions and concerns from the patient and her husband and advised that I will forward her message to Dr. Acie Fredrickson for review. They were thankful for my help.

## 2019-06-03 NOTE — Telephone Encounter (Signed)
I called and discuss Pat's echo, possible cardioversion with Pat. As of now, she would like to keep her appt for Dec. 10 for her cardioversion I told her she will likely need to come back to the office for a quick exam and blood work to update her history and lab work  She agrees to keeping that appt.

## 2019-06-05 ENCOUNTER — Other Ambulatory Visit (HOSPITAL_COMMUNITY): Payer: Medicare Other

## 2019-06-06 NOTE — Telephone Encounter (Signed)
Patient has appointments for Covid testing and lab work prior to cardioversion Dr. Acie Fredrickson has agreed to update patient's H & P on the day of the procedure

## 2019-06-21 ENCOUNTER — Other Ambulatory Visit: Payer: Medicare Other | Admitting: *Deleted

## 2019-06-21 ENCOUNTER — Other Ambulatory Visit: Payer: Self-pay

## 2019-06-21 DIAGNOSIS — I480 Paroxysmal atrial fibrillation: Secondary | ICD-10-CM | POA: Diagnosis not present

## 2019-06-21 DIAGNOSIS — Z7901 Long term (current) use of anticoagulants: Secondary | ICD-10-CM

## 2019-06-21 LAB — BASIC METABOLIC PANEL
BUN/Creatinine Ratio: 18 (ref 12–28)
BUN: 16 mg/dL (ref 8–27)
CO2: 20 mmol/L (ref 20–29)
Calcium: 9.4 mg/dL (ref 8.7–10.3)
Chloride: 103 mmol/L (ref 96–106)
Creatinine, Ser: 0.89 mg/dL (ref 0.57–1.00)
GFR calc Af Amer: 71 mL/min/{1.73_m2} (ref >59)
GFR calc non Af Amer: 61 mL/min/{1.73_m2} (ref >59)
Glucose: 86 mg/dL (ref 65–99)
Potassium: 4.8 mmol/L (ref 3.5–5.2)
Sodium: 138 mmol/L (ref 134–144)

## 2019-06-21 LAB — CBC
Hematocrit: 39.9 % (ref 34.0–46.6)
Hemoglobin: 13 g/dL (ref 11.1–15.9)
MCH: 32.2 pg (ref 26.6–33.0)
MCHC: 32.6 g/dL (ref 31.5–35.7)
MCV: 99 fL — ABNORMAL HIGH (ref 79–97)
Platelets: 265 10*3/uL (ref 150–450)
RBC: 4.04 x10E6/uL (ref 3.77–5.28)
RDW: 13.3 % (ref 11.7–15.4)
WBC: 6.7 10*3/uL (ref 3.4–10.8)

## 2019-06-24 ENCOUNTER — Other Ambulatory Visit (HOSPITAL_COMMUNITY)
Admission: RE | Admit: 2019-06-24 | Discharge: 2019-06-24 | Disposition: A | Discharge status: Home / Self Care | Payer: Medicare Other | Source: Ambulatory Visit | Attending: Cardiovascular Disease | Admitting: Cardiovascular Disease

## 2019-06-24 DIAGNOSIS — Z01812 Encounter for preprocedural laboratory examination: Secondary | ICD-10-CM | POA: Insufficient documentation

## 2019-06-24 DIAGNOSIS — Z20828 Contact with and (suspected) exposure to other viral communicable diseases: Secondary | ICD-10-CM | POA: Diagnosis not present

## 2019-06-25 LAB — NOVEL CORONAVIRUS, NAA (HOSP ORDER, SEND-OUT TO REF LAB; TAT 18-24 HRS): SARS-CoV-2, NAA: NOT DETECTED

## 2019-06-25 NOTE — Progress Notes (Signed)
Patient's pre op call done, patient states they have remained quarantine since covid test and all questions addressed.

## 2019-06-27 ENCOUNTER — Encounter (HOSPITAL_COMMUNITY)
Admission: RE | Disposition: A | Discharge status: Home / Self Care | Payer: Self-pay | Source: Home / Self Care | Attending: Cardiovascular Disease

## 2019-06-27 ENCOUNTER — Other Ambulatory Visit: Payer: Self-pay

## 2019-06-27 ENCOUNTER — Encounter (HOSPITAL_COMMUNITY): Payer: Self-pay | Admitting: Cardiovascular Disease

## 2019-06-27 ENCOUNTER — Ambulatory Visit (HOSPITAL_COMMUNITY)
Admission: RE | Admit: 2019-06-27 | Discharge: 2019-06-27 | Disposition: A | Discharge status: Home / Self Care | Payer: Medicare Other | Attending: Cardiovascular Disease | Admitting: Cardiovascular Disease

## 2019-06-27 ENCOUNTER — Ambulatory Visit (HOSPITAL_COMMUNITY): Payer: Medicare Other | Admitting: Anesthesiology

## 2019-06-27 DIAGNOSIS — Z8249 Family history of ischemic heart disease and other diseases of the circulatory system: Secondary | ICD-10-CM | POA: Insufficient documentation

## 2019-06-27 DIAGNOSIS — M8580 Other specified disorders of bone density and structure, unspecified site: Secondary | ICD-10-CM | POA: Diagnosis not present

## 2019-06-27 DIAGNOSIS — Z880 Allergy status to penicillin: Secondary | ICD-10-CM | POA: Insufficient documentation

## 2019-06-27 DIAGNOSIS — Z7951 Long term (current) use of inhaled steroids: Secondary | ICD-10-CM | POA: Insufficient documentation

## 2019-06-27 DIAGNOSIS — Z7901 Long term (current) use of anticoagulants: Secondary | ICD-10-CM | POA: Insufficient documentation

## 2019-06-27 DIAGNOSIS — I4819 Other persistent atrial fibrillation: Secondary | ICD-10-CM | POA: Diagnosis not present

## 2019-06-27 DIAGNOSIS — Z881 Allergy status to other antibiotic agents status: Secondary | ICD-10-CM | POA: Diagnosis not present

## 2019-06-27 DIAGNOSIS — Z79899 Other long term (current) drug therapy: Secondary | ICD-10-CM | POA: Insufficient documentation

## 2019-06-27 DIAGNOSIS — I10 Essential (primary) hypertension: Secondary | ICD-10-CM | POA: Diagnosis not present

## 2019-06-27 DIAGNOSIS — M1990 Unspecified osteoarthritis, unspecified site: Secondary | ICD-10-CM | POA: Diagnosis not present

## 2019-06-27 DIAGNOSIS — Z87891 Personal history of nicotine dependence: Secondary | ICD-10-CM | POA: Insufficient documentation

## 2019-06-27 HISTORY — PX: CARDIOVERSION: SHX1299

## 2019-06-27 SURGERY — CARDIOVERSION
Anesthesia: General

## 2019-06-27 MED ORDER — PROPOFOL 10 MG/ML IV BOLUS
INTRAVENOUS | Status: DC | PRN
  Administered 2019-06-27: 70 mg via INTRAVENOUS

## 2019-06-27 MED ORDER — LIDOCAINE 2% (20 MG/ML) 5 ML SYRINGE
INTRAMUSCULAR | Status: DC | PRN
  Administered 2019-06-27: 40 mg via INTRAVENOUS

## 2019-06-27 MED ORDER — SODIUM CHLORIDE 0.9 % IV SOLN
INTRAVENOUS | Status: DC
  Administered 2019-06-27: 13:00:00 via INTRAVENOUS

## 2019-06-27 NOTE — CV Procedure (Signed)
    Cardioversion Note  Monica Williamson [id number] 27-Jan-1941  Procedure: DC Cardioversion Indications: Atrial fib   Procedure Details Consent: Obtained Time Out: Verified patient identification, verified procedure, site/side was marked, verified correct patient position, special equipment/implants available, Radiology Safety Procedures followed,  medications/allergies/relevent history reviewed, required imaging and test results available.  Performed  The patient has been on adequate anticoagulation.  The patient received IV Lidocaine 60 mg followed by Propofol 70 mg IV  for sedation.  Synchronous cardioversion was performed at  120  joules.  The cardioversion was successful     Complications: No apparent complications Patient did tolerate procedure well.   Thayer Headings, Brooke Bonito., MD, [facility] 06/27/2019, 1:18 PM

## 2019-06-27 NOTE — H&P (Signed)
Cardiology Admission History and Physical:   Patient ID: Monica Williamson MRN: [id number]; DOB: 01/27/1941   Admission date: 06/27/2019  Primary Care Provider: Marton Redwood, MD Primary Cardiologist: Mertie Moores, MD  Primary Electrophysiologist:  None   Chief Complaint:  Atrial fihb   Patient Profile:   Monica Williamson is a 78 y.o. female with atrial fibrillation.  She was seen in the office several weeks ago with atrial fibrillation.  She is scheduled now for outpatient elective cardioversion.  History of Present Illness:   Monica Williamson has done well for years.  She used to be able to tell that she when she was in atrial fibrillation.  More recently she is not been able to tell and she was surprised to learn that she was in atrial fibrillation during her recent office visit.  She is continued to take her Xarelto as scheduled.  She is not missed any doses.  Heart Pathway Score:     Past Medical History:  Diagnosis Date  . Anxiety   . Arthritis   . Atrial fibrillation ([place])    when takes Warfarin  . Atrial fibrillation, currently in sinus rhythm   . Bronchiectasis ([place])    contolled with lovent and allergy med  . Childhood asthma   . Cholelithiasis   . Colon polyps   . Complication of anesthesia   . Depression   . Diverticulosis   . Dysrhythmia    sees Dr Acie Fredrickson annually for h/o Afib  . H/O bronchiectasis   . History of airborne allergies    uses inhalers  . Hypertension   . Nocturia   . Osteopenia   . PONV (postoperative nausea and vomiting)    severe n/v post anesthesia   . Pulmonary nodule   . Sleep related teeth grinding    wears a mouth guard at night  . Stress fracture of tibia 2014  . Vitamin D deficiency     Past Surgical History:  Procedure Laterality Date  . BREAST CYST EXCISION Right    over 20 years ago  . BREAST SURGERY    . CHOLECYSTECTOMY     11/2010  . INCISION AND DRAINAGE BREAST ABSCESS    . JOINT REPLACEMENT Right 09/2013   knee  . MENISCUS REPAIR Right 2014  . 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 TOTAL KNEE ARTHROPLASTY Left 02/24/2014   Procedure: TOTAL KNEE ARTHROPLASTY;  Surgeon: Vickey Huger, MD;  Location: [place];  Service: Orthopedics;  Laterality: Left;  [name] VAGINAL HYSTERECTOMY    . VESICOVAGINAL FISTULA CLOSURE W/ TAH       Medications Prior to Admission: Prior to Admission medications   Medication Sig Start Date End Date Taking? Authorizing Provider  acetaminophen (TYLENOL) 500 MG tablet Take 1,000 mg by mouth every 6 (six) hours as needed for moderate pain or headache.   Yes [provider]  albuterol (PROVENTIL HFA;VENTOLIN HFA) 108 (90 Base) MCG/ACT inhaler Inhale 2 puffs into the lungs 2 (two) times daily as needed for wheezing or shortness of breath. 03/09/16  Yes Parrett, Tammy S, NP  ALPRAZolam (XANAX) 0.5 MG tablet Take 0.25 mg by mouth at bedtime as needed for anxiety or sleep.    Yes [provider]  Ascorbic Acid (VITAMIN C PO) Take 1 tablet by mouth daily.  Yes [provider]  atorvastatin (LIPITOR) 40 MG tablet Take 40 mg by mouth daily at 6 PM.  02/16/17  Yes [provider]  buPROPion (WELLBUTRIN XL) 150 MG 24 hr tablet Take 150 mg by mouth every morning.  10/26/10  Yes Chandell Attridge, Wonda Cheng, MD  cetirizine (ZYRTEC) 10 MG tablet Take 10 mg by mouth daily.   Yes [provider]  Cholecalciferol (VITAMIN D-1000 MAX ST) 25 MCG (1000 UT) tablet Take 1,000 Units by mouth daily.   Yes [provider]  DILT-XR 180 MG 24 hr capsule TAKE 1 CAPSULE DAILY 11/01/18  Yes Sareen Randon, Wonda Cheng, MD  ezetimibe (ZETIA) 10 MG tablet Take 10 mg by mouth daily.  03/05/18  Yes [provider]  fluticasone (FLOVENT HFA) 110 MCG/ACT inhaler Inhale 2 puffs into the lungs 2 (two)  times daily as needed (for shortness of breath). 03/09/16  Yes Parrett, Tammy S, NP  metoprolol tartrate (LOPRESSOR) 25 MG tablet Take 1 tablet (25 mg total) by mouth 2 (two) times daily. 04/11/19 04/05/20 Yes Ezel Vallone, Wonda Cheng, MD  Probiotic Product (ALIGN) 4 MG CAPS Take 4 mg by mouth daily.   Yes [provider]  psyllium (METAMUCIL) 58.6 % powder Take 1 packet by mouth daily.   Yes [provider]  XARELTO 15 MG TABS tablet TAKE 1 TABLET DAILY WITH SUPPER Patient taking differently: 15 mg daily with supper.  03/11/19  Yes Milani Lowenstein, Wonda Cheng, MD  fluticasone (FLONASE) 50 MCG/ACT nasal spray Place 2 sprays into both nostrils daily. Patient taking differently: Place 2 sprays into both nostrils daily as needed for allergies.  03/31/16   Brand Males, MD  propranolol (INDERAL) 10 MG tablet Take 1 tablet (10 mg total) by mouth 4 (four) times daily as needed (FOR PALPITATIONS Can take ONE every 30 minutes Up to 4 Doses). 08/15/16   Leomia Blake, Wonda Cheng, MD     Allergies:    Allergies  Allergen Reactions  . Warfarin And Related Hives and Itching  . Augmentin [Amoxicillin-Pot Clavulanate] Rash  . Avelox [Moxifloxacin] Diarrhea    Social History:   Social History   Socioeconomic History  . Marital status: Married    Spouse name: Not on file  . Number of children: 3  . Years of education: Not on file  . Highest education level: Not on file  Occupational History  . Occupation: Retired  Tobacco Use  . Smoking status: Former Smoker    Packs/day: 0.75    Years: 20.00    Pack years: 15.00    Quit date: 07/18/1978    Years since quitting: 40.9  . Smokeless tobacco: Never Used  Substance and Sexual Activity  . Alcohol use: Not on file    Comment: daily cocktail  . Drug use: No  . Sexual activity: Yes    Birth control/protection: Post-menopausal  Other Topics Concern  . Not on file  Social History Narrative  . Not on file   Social Determinants of Health   Financial  Resource Strain:   . Difficulty of Paying Living Expenses: Not on file  Food Insecurity:   . Worried About Charity fundraiser in the Last Year: Not on file  . Ran Out of Food in the Last Year: Not on file  Transportation Needs:   . Lack of Transportation (Medical): Not on file  . Lack of Transportation (Non-Medical): Not on file  Physical Activity:   . Days of Exercise per Week: Not on file  . Minutes  of Exercise per Session: Not on file  Stress:   . Feeling of Stress : Not on file  Social Connections:   . Frequency of Communication with Friends and Family: Not on file  . Frequency of Social Gatherings with Friends and Family: Not on file  . Attends Religious Services: Not on file  . Active Member of Clubs or Organizations: Not on file  . Attends Archivist Meetings: Not on file  . Marital Status: Not on file  Intimate Partner Violence:   . Fear of Current or Ex-Partner: Not on file  . Emotionally Abused: Not on file  . Physically Abused: Not on file  . Sexually Abused: Not on file    Family History:   The patient's family history includes Heart disease in her sister. There is no history of Colon cancer.    ROS:  Please see the history of present illness.  All other ROS reviewed and negative.     Physical Exam/Data:   Vitals:   06/27/19 1313 06/27/19 1315 06/27/19 1330 06/27/19 1344  BP:  (!) 115/42 (!) 124/45 (!) 138/52  Pulse: (!) 59  (!) 48   Resp: 13 12 12 12   Temp:      TempSrc:      SpO2: 100% 93% 98% 99%  Weight:      Height:       No intake or output data in the 24 hours ending 06/27/19 1442 Last 3 Weights 06/27/2019 05/22/2019 03/29/2019  Weight (lbs) 150 lb 152 lb 153 lb  Weight (kg) 68.04 kg 68.947 kg 69.4 kg     Body mass index is 23.49 kg/m.  General:  Well nourished, well developed, in no acute distress HEENT: normal Lymph: no adenopathy Neck: no JVD Endocrine:  No thryomegaly Vascular: No carotid bruits; FA pulses 2+ bilaterally  without bruits  Cardiac:  normal S1, S2;  Irreg. Irreg.  Lungs:  clear to auscultation bilaterally, no wheezing, rhonchi or rales  Abd: soft, nontender, no hepatomegaly  Ext: no edema Musculoskeletal:  No deformities, BUE and BLE strength normal and equal Skin: warm and dry  Neuro:  CNs 2-12 intact, no focal abnormalities noted Psych:  Normal affect    EKG:   Atrial fib   Relevant CV Studies:   Laboratory Data:  High Sensitivity Troponin:  No results for input(s): TROPONINIHS in the last 720 hours.    Chemistry Recent Labs  Lab 06/21/19 1053  NA 138  K 4.8  CL 103  CO2 20  GLUCOSE 86  BUN 16  CREATININE 0.89  CALCIUM 9.4  GFRNONAA 61  GFRAA 71    No results for input(s): PROT, ALBUMIN, AST, ALT, ALKPHOS, BILITOT in the last 168 hours. Hematology Recent Labs  Lab 06/21/19 1053  WBC 6.7  RBC 4.04  HGB 13.0  HCT 39.9  MCV 99*  MCH 32.2  MCHC 32.6  RDW 13.3  PLT 265   BNPNo results for input(s): BNP, PROBNP in the last 168 hours.  DDimer No results for input(s): DDIMER in the last 168 hours.   Radiology/Studies:  No results found.  Assessment and Plan:   1. Atrial fib:   Pt has persistent AF now.   Has bee non Xarelto.   Will proceed with cardioversion .      For questions or updates, please contact [place] Please consult [email or web address] for contact info under        Signed, Mertie Moores, MD  06/27/2019 2:42 PM

## 2019-06-27 NOTE — Anesthesia Preprocedure Evaluation (Addendum)
Anesthesia Evaluation  Patient identified by MRN, date of birth, ID band Patient awake    Reviewed: Allergy & Precautions, H&P , NPO status , Patient's Chart, lab work & pertinent test results  History of Anesthesia Complications (+) PONV and history of anesthetic complications  Airway Mallampati: II  TM Distance: >3 FB Neck ROM: Full    Dental no notable dental hx.    Pulmonary asthma , former smoker,    Pulmonary exam normal        Cardiovascular hypertension, + dysrhythmias Atrial Fibrillation  Rhythm:Irregular Rate:Normal     Neuro/Psych PSYCHIATRIC DISORDERS Anxiety Depression    GI/Hepatic negative GI ROS, Neg liver ROS,   Endo/Other    Renal/GU negative Renal ROS     Musculoskeletal   Abdominal   Peds  Hematology   Anesthesia Other Findings   Reproductive/Obstetrics                            Anesthesia Physical  Anesthesia Plan  ASA: III  Anesthesia Plan: General   Post-op Pain Management:    Induction: Intravenous  PONV Risk Score and Plan: Treatment may vary due to age or medical condition  Airway Management Planned: Mask  Additional Equipment:   Intra-op Plan:   Post-operative Plan:   Informed Consent: I have reviewed the patients History and Physical, chart, labs and discussed the procedure including the risks, benefits and alternatives for the proposed anesthesia with the patient or authorized representative who has indicated his/her understanding and acceptance.     Dental advisory given  Plan Discussed with: CRNA and Anesthesiologist  Anesthesia Plan Comments:        Anesthesia Quick Evaluation

## 2019-06-27 NOTE — Anesthesia Procedure Notes (Signed)
Procedure Name: General with mask airway Date/Time: 06/27/2019 1:05 PM Performed by: Alain Marion, CRNA Pre-anesthesia Checklist: Patient identified, Emergency Drugs available, Suction available, Patient being monitored and Timeout performed Patient Re-evaluated:Patient Re-evaluated prior to induction Oxygen Delivery Method: Ambu bag Preoxygenation: Pre-oxygenation with 100% oxygen Induction Type: IV induction Placement Confirmation: positive ETCO2

## 2019-06-27 NOTE — Transfer of Care (Signed)
Immediate Anesthesia Transfer of Care Note  Patient: Monica Williamson  Procedure(s) Performed: CARDIOVERSION (N/A )  Patient Location: Endoscopy Unit  Anesthesia Type:General  Level of Consciousness: awake, alert  and oriented  Airway & Oxygen Therapy: Patient Spontanous Breathing  Post-op Assessment: Report given to RN and Post -op Vital signs reviewed and stable  Post vital signs: Reviewed and stable  Last Vitals:  Vitals Value Taken Time  BP 105/51 06/27/19 1312  Temp    Pulse 59 06/27/19 1313  Resp 13 06/27/19 1313  SpO2 100 % 06/27/19 1313    Last Pain:  Vitals:   06/27/19 1222  TempSrc: Temporal  PainSc: 0-No pain         Complications: No apparent anesthesia complications

## 2019-06-27 NOTE — Anesthesia Postprocedure Evaluation (Signed)
Anesthesia Post Note  Patient: Monica Williamson  Procedure(s) Performed: CARDIOVERSION (N/A )     Anesthesia Post Evaluation  Last Vitals:  Vitals:   06/27/19 1330 06/27/19 1344  BP: (!) 124/45 (!) 138/52  Pulse: (!) 48   Resp: 12 12  Temp:    SpO2: 98% 99%    Last Pain:  Vitals:   06/27/19 1344  TempSrc:   PainSc: 0-No pain                 Quamaine Webb DANIEL

## 2019-06-27 NOTE — Discharge Instructions (Signed)

## 2019-07-03 ENCOUNTER — Ambulatory Visit: Payer: Medicare Other | Admitting: Cardiology

## 2019-07-23 DIAGNOSIS — F4322 Adjustment disorder with anxiety: Secondary | ICD-10-CM | POA: Diagnosis not present

## 2019-07-31 NOTE — Progress Notes (Signed)
Cardiology Office Note:    Date:  08/01/2019   ID:  Monica Williamson, DOB 1940/10/17, MRN [id number]  PCP:  Monica Redwood, MD  Cardiologist:  Monica Moores, MD  Referring MD: Monica Redwood, MD   Chief Complaint  Patient presents with  . Follow-up  . Atrial Fibrillation    History of Present Illness:    Monica Williamson is a 79 y.o. female with a past medical history significant for paroxysmal atrial fibrillation, on Xarelto, hypertension, carotid artery disease s/p R CEAand osteoarthritis status post bilateral knee replacements.  In the past she had been able to control atrial fibrillation with as needed propranolol.  Ms. Monica Williamson was seen on 05/22/2019 by Monica. Acie Williamson at which time she was noting to be in atrial fibrillation per her [facility] monitor.  She had tried propranolol with no response.  She was started on metoprolol with no improvement.  She continued to be on Xarelto.  She was arranged for electrical cardioversion which was done on 06/27/2019 and obtained normal sinus rhythm.  However, a few days later she noted that she was back in atrial fibrillation.  She had felt very well after the cardioversion but then noted back in afib after about 6 days.   Today she is here for follow-up and to discuss options for treating atrial fibrillation.  She thinks she went back into atrial fibrillation about 6 days after her cardioversion.  She says that she has no awareness of being in afib.  She checks her heart rhythm on her [employer] mobile app every day and it has shown probable atrial fibrillation.  She says that her heart rate range is 80s-100s, mostly in the 90s.  She has been working out twice a week with a trainer and walks regularly. She denies chest pain/pressure, shortness of breath, orthopnea, PND, edema, palpitations.  She has had some lightheadedness when first rising after being seated for a time.  We discussed pumping her feet before standing and ensuring that she has adequate  fluid intake.  She has already received her first Covid vaccination.    Cardiac studies   Echocardiogram 05/30/2019 IMPRESSIONS   1. Left ventricular ejection fraction, by visual estimation, is 60 to 65%. The left ventricle has normal function. Left ventricular septal wall thickness was moderately increased. Mildly increased left ventricular posterior wall thickness.  2. Left ventricular diastolic function could not be evaluated due to underlying atrial fibrillation.  3. Global right ventricle has normal systolic function.The right ventricular size is normal. No increase in right ventricular wall thickness.  4. Left atrial size was mildly dilated.  5. Right atrial size was normal.  6. Mild to moderate mitral annular calcification. Moderate mitral valve regurgitation. No evidence of mitral stenosis.  7. The tricuspid valve is normal in structure. Tricuspid valve regurgitation is not demonstrated.  8. The aortic valve is tricuspid. Aortic valve regurgitation is not visualized. Moderate aortic valve sclerosis/calcification without any evidence of aortic stenosis.  9. The pulmonic valve was normal in structure. Pulmonic valve regurgitation is trivial. 10. Normal pulmonary artery systolic pressure. 11. The inferior vena cava is dilated in size with >50% respiratory variability, suggesting right atrial pressure of 8 mmHg.    Past Medical History:  Diagnosis Date  . Anxiety   . Arthritis   . Atrial fibrillation ([place])    when takes Warfarin  . Atrial fibrillation, currently in sinus rhythm   . Bronchiectasis ([place])    contolled with lovent and allergy med  . Childhood  asthma   . Cholelithiasis   . Colon polyps   . Complication of anesthesia   . Depression   . Diverticulosis   . Dysrhythmia    sees Monica Williamson annually for h/o Afib  . H/O bronchiectasis   . History of airborne allergies    uses inhalers  . Hypertension   . Nocturia   . Osteopenia   . PONV (postoperative nausea and  vomiting)    severe n/v post anesthesia   . Pulmonary nodule   . Sleep related teeth grinding    wears a mouth guard at night  . Stress fracture of tibia 2014  . Vitamin D deficiency     Past Surgical History:  Procedure Laterality Date  . BREAST CYST EXCISION Right    over 20 years ago  . BREAST SURGERY    . CARDIOVERSION N/A 06/27/2019   Procedure: CARDIOVERSION;  Surgeon: Monica [facility] Monica Cheng, MD;  Location: [facility];  Service: Cardiovascular;  Laterality: N/A;  . CHOLECYSTECTOMY     11/2010  . INCISION AND DRAINAGE BREAST ABSCESS    . JOINT REPLACEMENT Right 09/2013   knee  . MENISCUS REPAIR Right 2014  . 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   Procedure: RIGHT TOTAL KNEE ARTHROPLASTY;  Surgeon: Monica Huger, MD;  Location: [facility];  Service: Orthopedics;  Laterality: Right;  . TOTAL KNEE ARTHROPLASTY Left 02/24/2014   Monica Monica Williamson  . TOTAL KNEE ARTHROPLASTY Left 02/24/2014   Procedure: TOTAL KNEE ARTHROPLASTY;  Surgeon: Monica Huger, MD;  Location: [facility];  Service: Orthopedics;  Laterality: Left;  [name] VAGINAL HYSTERECTOMY    . VESICOVAGINAL FISTULA CLOSURE W/ TAH      Current Medications: Current Meds  Medication Sig  . acetaminophen (TYLENOL) 500 MG tablet Take 1,000 mg by mouth every 6 (six) hours as needed for moderate pain or headache.  . albuterol (PROVENTIL HFA;VENTOLIN HFA) 108 (90 Base) MCG/ACT inhaler Inhale 2 puffs into the lungs 2 (two) times daily as needed for wheezing or shortness of breath.  . ALPRAZolam (XANAX) 0.5 MG tablet Take 0.25 mg by mouth at bedtime as needed for anxiety or sleep.   . Ascorbic Acid (VITAMIN C PO) Take 1 tablet by mouth daily.  [name] atorvastatin (LIPITOR) 40 MG tablet Take 40 mg by mouth daily at 6 PM.   . buPROPion (WELLBUTRIN XL) 150 MG 24 hr tablet Take 150 mg by mouth every morning.   . cetirizine (ZYRTEC) 10 MG tablet Take 10 mg by mouth daily.  . Cholecalciferol (VITAMIN D-1000 MAX ST) 25 MCG (1000 UT) tablet Take 1,000 Units by mouth  daily.  [name] DILT-XR 180 MG 24 hr capsule TAKE 1 CAPSULE DAILY  . ezetimibe (ZETIA) 10 MG tablet Take 10 mg by mouth daily.   . fluticasone (FLONASE) 50 MCG/ACT nasal spray Place 2 sprays into both nostrils daily.  . fluticasone (FLOVENT HFA) 110 MCG/ACT inhaler Inhale 2 puffs into the lungs 2 (two) times daily as needed (for shortness of breath).  . metoprolol tartrate (LOPRESSOR) 50 MG tablet Take 1 tablet (50 mg total) by mouth 2 (two) times daily.  . Probiotic Product (ALIGN) 4 MG CAPS Take 4 mg by mouth daily.  . propranolol (INDERAL) 10 MG tablet Take 1 tablet (10 mg total) by mouth 4 (four) times daily as needed (FOR PALPITATIONS Can take ONE every 30 minutes Up to 4 Doses).  . psyllium (METAMUCIL) 58.6 % powder Take 1 packet by mouth daily.  . [DISCONTINUED] metoprolol tartrate (LOPRESSOR) 25 MG  tablet Take 1 tablet (25 mg total) by mouth 2 (two) times daily.  . [DISCONTINUED] XARELTO 15 MG TABS tablet TAKE 1 TABLET DAILY WITH SUPPER     Allergies:   Warfarin and related, Augmentin [amoxicillin-pot clavulanate], and Avelox [moxifloxacin]   Social History   Socioeconomic History  . Marital status: Married    Spouse name: Not on file  . Number of children: 3  . Years of education: Not on file  . Highest education level: Not on file  Occupational History  . Occupation: Retired  Tobacco Use  . Smoking status: Former Smoker    Packs/day: 0.75    Years: 20.00    Pack years: 15.00    Quit date: 07/18/1978    Years since quitting: 41.0  . Smokeless tobacco: Never Used  Substance and Sexual Activity  . Alcohol use: Not on file    Comment: daily cocktail  . Drug use: No  . Sexual activity: Yes    Birth control/protection: Post-menopausal  Other Topics Concern  . Not on file  Social History Narrative  . Not on file   Social Determinants of Health   Financial Resource Strain:   . Difficulty of Paying Living Expenses: Not on file  Food Insecurity:   . Worried About Ship broker in the Last Year: Not on file  . Ran Out of Food in the Last Year: Not on file  Transportation Needs:   . Lack of Transportation (Medical): Not on file  . Lack of Transportation (Non-Medical): Not on file  Physical Activity:   . Days of Exercise per Week: Not on file  . Minutes of Exercise per Session: Not on file  Stress:   . Feeling of Stress : Not on file  Social Connections:   . Frequency of Communication with Friends and Family: Not on file  . Frequency of Social Gatherings with Friends and Family: Not on file  . Attends Religious Services: Not on file  . Active Member of Clubs or Organizations: Not on file  . Attends Archivist Meetings: Not on file  . Marital Status: Not on file     Family History: The patient's family history includes Heart disease in her sister. There is no history of Colon cancer. ROS:   Please see the history of present illness.     All other systems reviewed and are negative.   EKG:  EKG is  ordered today.  The ekg ordered today demonstrates atrial fibrillation, 83 bpm  Recent Labs: 06/21/2019: BUN 16; Creatinine, Ser 0.89; Hemoglobin 13.0; Platelets 265; Potassium 4.8; Sodium 138   Recent Lipid Panel No results found for: CHOL, TRIG, HDL, CHOLHDL, VLDL, LDLCALC, LDLDIRECT  Physical Exam:    VS:  BP 112/70   Pulse 83   Ht '5\' 7"'  (1.702 m)   Wt 153 lb (69.4 kg)   SpO2 98%   BMI 23.96 kg/m     Wt Readings from Last 6 Encounters:  08/01/19 153 lb (69.4 kg)  06/27/19 150 lb (68 kg)  05/22/19 152 lb (68.9 kg)  03/29/19 153 lb (69.4 kg)  05/18/18 150 lb (68 kg)  03/15/18 150 lb (68 kg)     Physical Exam  Constitutional: She is oriented to person, place, and time. She appears well-developed and well-nourished. No distress.  Very well-appearing female, appears younger than her stated age.  HENT:  Head: Normocephalic and atraumatic.  Neck: No JVD present.  Cardiovascular: Normal rate, normal heart sounds and intact  distal  pulses. An irregularly irregular rhythm present. Exam reveals no gallop and no friction rub.  No murmur heard. Pulmonary/Chest: Effort normal and breath sounds normal. No respiratory distress. She has no wheezes. She has no rales.  Abdominal: Soft. Bowel sounds are normal.  Musculoskeletal:        General: No edema. Normal range of motion.     Cervical back: Normal range of motion and neck supple.  Neurological: She is alert and oriented to person, place, and time.  Skin: Skin is warm and dry.  Psychiatric: She has a normal mood and affect. Her behavior is normal. Judgment and thought content normal.  Vitals reviewed.    ASSESSMENT:    1. Persistent atrial fibrillation ([place])   2. Hyperlipidemia, unspecified hyperlipidemia type   3. Bilateral carotid artery stenosis    PLAN:    In order of problems listed above:  Persistent atrial fibrillation -A. fib was previously well controlled with as needed propranolol however patient was noted to be in persistent atrial fibrillation in November.  She was on uninterrupted anticoagulation and underwent electrical cardioversion on 05/28/2019 and obtained sinus rhythm.  However, she has reverted back to atrial fibrillation.   She has no awareness of being in A. fib. -She continues on diltiazem 180 mg daily and metoprolol 25 mg twice daily. -Since she is asymptomatic she says that she is fine with following a rate control strategy.  Her husband has been in atrial fibrillation for years and she says he does fine. -Her heart rates have been mostly in the 90s occasionally up over 100.  Will trial an increase in her metoprolol to see if she tolerates it and improves her heart rate.  If she has significant fatigue or increase in dizziness, would go back on the dose.  I discussed this with her. -Patient continues on Xarelto for stroke risk reduction. CHA2DS2/VAS Stroke Risk score is 4 (see HTN, age (1 2), female).  She is on 15 mg daily due to eGFR <50.  However, her renal function has improved with most recent CrCl 3m/min. She may continue to fluctuate pretty close to 50 even with normal appearing renal function.  After discussing with the pharmacist I will switch her from Xarelto to Eliquis since her weight is consistently above 60kg and SCr < 1.5 so she would more reliably qualify for 553mdosing.  I discussed this with the patient and she agrees.  Hyperlipidemia -On atorvastatin 40 mg daily and Zetia 10 mg daily -LDL was 79 by labs done at PCP in September.   Carotid artery disease -Moderate, followed by vascular surgery.  Status post right carotid endarterectomy. -No TIA or stroke. -Continues on statin   Medication Adjustments/Labs and Tests Ordered: Current medicines are reviewed at length with the patient today.  Concerns regarding medicines are outlined above. Labs and tests ordered and medication changes are outlined in the patient instructions below:  Patient Instructions  Medication Instructions:  1) Increase Metoprolol 50 mg twice a day   2) Stop Xarelto   3) Start Eliquis 5 mg twice a day, you can take your first dose tonight and wait 12 hours before taking the next dose   *If you need a refill on your cardiac medications before your next appointment, please call your pharmacy*  Lab Work: None ordered   If you have labs (blood work) drawn today and your tests are completely normal, you will receive your results only by: . Marland KitchenyChart Message (if you have MyChart) OR .  A paper copy in the mail If you have any lab test that is abnormal or we need to change your treatment, we will call you to review the results.  Testing/Procedures: None   Follow-Up: You are scheduled to see Monica Moores, MD on 10/31/2019 @ 10:20 AM  Other Instructions None      Signed, Daune Perch, NP  08/01/2019 12:39 PM    [facility]

## 2019-08-01 ENCOUNTER — Ambulatory Visit (INDEPENDENT_AMBULATORY_CARE_PROVIDER_SITE_OTHER): Payer: Medicare Other | Admitting: Cardiology

## 2019-08-01 ENCOUNTER — Encounter: Payer: Self-pay | Admitting: Cardiology

## 2019-08-01 ENCOUNTER — Ambulatory Visit: Payer: Medicare Other | Attending: Internal Medicine

## 2019-08-01 ENCOUNTER — Other Ambulatory Visit: Payer: Self-pay

## 2019-08-01 VITALS — BP 112/70 | HR 83 | Ht 67.0 in | Wt 153.0 lb

## 2019-08-01 DIAGNOSIS — Z20822 Contact with and (suspected) exposure to covid-19: Secondary | ICD-10-CM | POA: Diagnosis not present

## 2019-08-01 DIAGNOSIS — I4819 Other persistent atrial fibrillation: Secondary | ICD-10-CM | POA: Diagnosis not present

## 2019-08-01 DIAGNOSIS — E785 Hyperlipidemia, unspecified: Secondary | ICD-10-CM

## 2019-08-01 DIAGNOSIS — I6523 Occlusion and stenosis of bilateral carotid arteries: Secondary | ICD-10-CM | POA: Diagnosis not present

## 2019-08-01 MED ORDER — METOPROLOL TARTRATE 50 MG PO TABS: 50.0000 mg | ORAL_TABLET | Freq: Two times a day (BID) | ORAL | 3 refills | Status: DC

## 2019-08-01 MED ORDER — APIXABAN 5 MG PO TABS: 5.0000 mg | ORAL_TABLET | Freq: Two times a day (BID) | ORAL | 2 refills | Status: DC

## 2019-08-01 NOTE — Patient Instructions (Addendum)
Medication Instructions:  1) Increase Metoprolol 50 mg twice a day   2) Stop Xarelto   3) Start Eliquis 5 mg twice a day, you can take your first dose tonight and wait 12 hours before taking the next dose   *If you need a refill on your cardiac medications before your next appointment, please call your pharmacy*  Lab Work: None ordered   If you have labs (blood work) drawn today and your tests are completely normal, you will receive your results only by: [name] MyChart Message (if you have MyChart) OR . A paper copy in the mail If you have any lab test that is abnormal or we need to change your treatment, we will call you to review the results.  Testing/Procedures: None   Follow-Up: You are scheduled to see Mertie Moores, MD on 10/31/2019 @ 10:20 AM  Other Instructions None

## 2019-08-02 LAB — NOVEL CORONAVIRUS, NAA: SARS-CoV-2, NAA: NOT DETECTED

## 2019-08-07 ENCOUNTER — Other Ambulatory Visit: Payer: Self-pay

## 2019-08-07 MED ORDER — APIXABAN 5 MG PO TABS: 5.0000 mg | ORAL_TABLET | Freq: Two times a day (BID) | ORAL | 1 refills | Status: DC

## 2019-08-07 NOTE — Telephone Encounter (Signed)
Pt last saw Daune Perch, NP on 08/01/19, last labs 06/21/19 Creat 0.89, age 79, weight 69.4, based on specified criteria pt is on appropriate dosage of Eliquis 5mg  BID.  Will refill rx to [facility] as requested.

## 2019-08-12 DIAGNOSIS — F4322 Adjustment disorder with anxiety: Secondary | ICD-10-CM | POA: Diagnosis not present

## 2019-08-15 ENCOUNTER — Other Ambulatory Visit: Payer: Self-pay

## 2019-09-03 ENCOUNTER — Other Ambulatory Visit: Payer: Self-pay | Admitting: Internal Medicine

## 2019-09-03 DIAGNOSIS — Z1231 Encounter for screening mammogram for malignant neoplasm of breast: Secondary | ICD-10-CM

## 2019-10-10 ENCOUNTER — Other Ambulatory Visit: Payer: Self-pay

## 2019-10-10 ENCOUNTER — Ambulatory Visit
Admission: RE | Admit: 2019-10-10 | Discharge: 2019-10-10 | Disposition: A | Discharge status: Home / Self Care | Payer: Medicare Other | Source: Ambulatory Visit | Attending: Internal Medicine | Admitting: Internal Medicine

## 2019-10-10 DIAGNOSIS — Z1231 Encounter for screening mammogram for malignant neoplasm of breast: Secondary | ICD-10-CM | POA: Diagnosis not present

## 2019-10-10 DIAGNOSIS — I6521 Occlusion and stenosis of right carotid artery: Secondary | ICD-10-CM | POA: Diagnosis not present

## 2019-10-10 DIAGNOSIS — I6522 Occlusion and stenosis of left carotid artery: Secondary | ICD-10-CM | POA: Diagnosis not present

## 2019-10-27 ENCOUNTER — Other Ambulatory Visit: Payer: Self-pay | Admitting: Cardiovascular Disease

## 2019-10-31 ENCOUNTER — Other Ambulatory Visit: Payer: Self-pay

## 2019-10-31 ENCOUNTER — Ambulatory Visit (INDEPENDENT_AMBULATORY_CARE_PROVIDER_SITE_OTHER): Payer: Medicare Other | Admitting: Cardiovascular Disease

## 2019-10-31 ENCOUNTER — Encounter: Payer: Self-pay | Admitting: Cardiovascular Disease

## 2019-10-31 VITALS — BP 116/76 | HR 73 | Ht 66.0 in | Wt 150.2 lb

## 2019-10-31 DIAGNOSIS — I6523 Occlusion and stenosis of bilateral carotid arteries: Secondary | ICD-10-CM

## 2019-10-31 DIAGNOSIS — I4819 Other persistent atrial fibrillation: Secondary | ICD-10-CM | POA: Diagnosis not present

## 2019-10-31 NOTE — Patient Instructions (Signed)
Medication Instructions:  Your physician recommends that you continue on your current medications as directed. Please refer to the Current Medication list given to you today.  *If you need a refill on your cardiac medications before your next appointment, please call your pharmacy*   Lab Work: None Ordered If you have labs (blood work) drawn today and your tests are completely normal, you will receive your results only by: [name] MyChart Message (if you have MyChart) OR . A paper copy in the mail If you have any lab test that is abnormal or we need to change your treatment, we will call you to review the results.   Testing/Procedures: None Ordered   Follow-Up: At [facility], you and your health needs are our priority.  As part of our continuing mission to provide you with exceptional heart care, we have created designated Provider Care Teams.  These Care Teams include your primary Cardiologist (physician) and Advanced Practice Providers (APPs -  Physician Assistants and Nurse Practitioners) who all work together to provide you with the care you need, when you need it.  We recommend signing up for the patient portal called "MyChart".  Sign up information is provided on this After Visit Summary.  MyChart is used to connect with patients for Virtual Visits (Telemedicine).  Patients are able to view lab/test results, encounter notes, upcoming appointments, etc.  Non-urgent messages can be sent to your provider as well.   To learn more about what you can do with MyChart, go to [email or web address].    Your next appointment:   6 month(s)  The format for your next appointment:   In Person  Provider:   You may see Mertie Moores, MD or one of the following Advanced Practice Providers on your designated Care Team:    Richardson Dopp, PA-C  [place], [place]  Daune Perch, [place]

## 2019-10-31 NOTE — Progress Notes (Signed)
Cardiology Office Note   Date:  10/31/2019   ID:  Monica Williamson, DOB 1940/09/15, MRN [id number]  PCP:  Marton Redwood, MD  Cardiologist:   Mertie Moores, MD   Chief Complaint  Patient presents with  . Atrial Fibrillation      Monica Williamson is a 79 y.o. female who presents for follow-up of her paroxysmal atrial fibrillation.  She has a CHADS VASC 2 score of 3 and is on Xarelto .   She has had both  knees replaced.  She has had only 2 episodes of PAF - resolved very quickly with the propranolol.   Doing well.   Jan. 27, 2017:  Jan. 29, 2018:  Monica Williamson is seen today for follow up of her PAF .  Has had 3 episodes of PAF in December.  Takes a propranolol with resolution ( 1-2 tabs )  Typically occurs at night ,  Lasts perhaps 30 minutes.   Aug. 29, 2018:  Monica Williamson is doing well Has had several episodes of paroxysmal atrial fib.  Doing well Leaving for [place] in 2 weeks.    Aug. 29, 2019 Monica Williamson is seen today for follow-up of her paroxysmal atrial fibrillation.   Monica Williamson came to her appt today .   I last saw her in August, 2018.  She saw [name], [place] in January, 2019 for some palpitations.  She is found to be in normal sinus rhythm and was reassured.  She is been on Xarelto.  Diltiazem seems to control her atrial fibrillation fairly well.  BP has been quite variable recently - has had low diastolic BP readings. Saw Dr. Brigitte Pulse.    Was having some dizziness.   Also had ringing in her ears.   con  HCTZ was stopped.   Now on Valsartan. LDL was found to be slightly elevated - 85.   Was started on Zetia.  Scheduled to have repeat lipids drawn in November. Has had more palpitations.  Takes propranolol with resolution of her symptoms  Thinks she is having more atiral fib.  She cannot really feel any palpitations - just gets a notification when her BP says she has an irregular HR   Has moderate carotid disease.  She has been seen by the vein and vascular  specialist.  She is scheduled for repeat duplex scan in November.  Sept. 11, 2020   Monica Williamson is seen today for follow up of her PAF, hyperlipidemia   She is had right carotid endarterectomy since I last saw her. She is in atrial fib today .   Cannot feel the HR irregularity .   May 22, 2019: Monica Williamson is seen today for follow-up visit.  She has a history of paroxysmal atrial fibrillation.  She has mild hyperlipidemia.  She has moderate carotid disease and has had a right carotid endarterectomy.  Has gotten a Kardia monitor Has Afib frequently  Tried propranolol - did not seem toaffect the AF We started metoprolol ,  Is on xarelto  She held her xarelto back on       For a back injection  October 31, 2019  Monica Williamson is seen today for follow up of her afib  She has a hx of carotid stenosis and is s/p CEA. We performed a cardioversion Dec. 2020 but she went back into AF several days later  She is doing well.  Takes her eliquis BID    Past Medical History:  Diagnosis Date  . Anxiety   . Arthritis   .  Atrial fibrillation ([place])    when takes Warfarin  . Atrial fibrillation, currently in sinus rhythm   . Bronchiectasis ([place])    contolled with lovent and allergy med  . Childhood asthma   . Cholelithiasis   . Colon polyps   . Complication of anesthesia   . Depression   . Diverticulosis   . Dysrhythmia    sees Dr Acie Fredrickson annually for h/o Afib  . H/O bronchiectasis   . History of airborne allergies    uses inhalers  . Hypertension   . Nocturia   . Osteopenia   . PONV (postoperative nausea and vomiting)    severe n/v post anesthesia   . Pulmonary nodule   . Sleep related teeth grinding    wears a mouth guard at night  . Stress fracture of tibia 2014  . Vitamin D deficiency     Past Surgical History:  Procedure Laterality Date  . BREAST CYST EXCISION Right    over 20 years ago  . BREAST SURGERY    . CARDIOVERSION N/A 06/27/2019   Procedure: CARDIOVERSION;  Surgeon: Acie Fredrickson Wonda Cheng, MD;  Location: [facility] ENDOSCOPY;  Service: Cardiovascular;  Laterality: N/A;  . CHOLECYSTECTOMY     11/2010  . INCISION AND DRAINAGE BREAST ABSCESS    . JOINT REPLACEMENT Right 09/2013   knee  . MENISCUS REPAIR Right 2014  . 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   Procedure: RIGHT TOTAL KNEE ARTHROPLASTY;  Surgeon: Vickey Huger, MD;  Location: [facility];  Service: Orthopedics;  Laterality: Right;  . 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 TOTAL KNEE ARTHROPLASTY Left 02/24/2014   Procedure: TOTAL KNEE ARTHROPLASTY;  Surgeon: Vickey Huger, MD;  Location: [facility];  Service: Orthopedics;  Laterality: Left;  [name] VAGINAL HYSTERECTOMY    . VESICOVAGINAL FISTULA CLOSURE W/ TAH       Current Outpatient Medications  Medication Sig Dispense Refill  . acetaminophen (TYLENOL) 500 MG tablet Take 1,000 mg by mouth every 6 (six) hours as needed for moderate pain or headache.    . albuterol (PROVENTIL HFA;VENTOLIN HFA) 108 (90 Base) MCG/ACT inhaler Inhale 2 puffs into the lungs 2 (two) times daily as needed for wheezing or shortness of breath. 1 Inhaler 5  . ALPRAZolam (XANAX) 0.5 MG tablet Take 0.25 mg by mouth at bedtime as needed for anxiety or sleep.     [name] apixaban (ELIQUIS) 5 MG TABS tablet Take 1 tablet (5 mg total) by mouth 2 (two) times daily. 180 tablet 1  . Ascorbic Acid (VITAMIN C PO) Take 1 tablet by mouth daily.    [name] atorvastatin (LIPITOR) 40 MG tablet Take 40 mg by mouth daily at 6 PM.     . buPROPion (WELLBUTRIN XL) 150 MG 24 hr tablet Take 150 mg by mouth every morning.  30 tablet 11  . cetirizine (ZYRTEC) 10 MG tablet Take 10 mg by mouth daily.    . Cholecalciferol (VITAMIN D-1000 MAX ST) 25 MCG (1000 UT) tablet Take 1,000 Units by mouth daily.    [name] diltiazem (DILACOR XR) 180 MG 24 hr capsule TAKE 1 CAPSULE DAILY 90 capsule 2  . ezetimibe (ZETIA) 10 MG tablet Take 10 mg by mouth daily.     . fluticasone (FLONASE) 50 MCG/ACT nasal spray Place 2 sprays into both nostrils daily. 16 g 5    . fluticasone (FLOVENT HFA) 110 MCG/ACT inhaler Inhale 2 puffs into the lungs 2 (two) times daily as needed (for shortness of breath).  1 Inhaler 5  . metoprolol tartrate (LOPRESSOR) 25 MG tablet Take 1 tablet by mouth 2 (two) times daily.    . Probiotic Product (ALIGN) 4 MG CAPS Take 4 mg by mouth daily.    . propranolol (INDERAL) 10 MG tablet Take 1 tablet (10 mg total) by mouth 4 (four) times daily as needed (FOR PALPITATIONS Can take ONE every 30 minutes Up to 4 Doses). 60 tablet 11  . psyllium (METAMUCIL) 58.6 % powder Take 1 packet by mouth daily.     No current facility-administered medications for this visit.    Allergies:   Warfarin and related, Augmentin [amoxicillin-pot clavulanate], and Avelox [moxifloxacin]    Social History:  The patient  reports that she quit smoking about 41 years ago. She has a 15.00 pack-year smoking history. She has never used smokeless tobacco. She reports that she does not use drugs.   Family History:  The patient's family history includes Heart disease in her sister.    ROS:  Please see the history of present illness.   Otherwise, review of systems are positive for none.   All other systems are reviewed and negative.   Physical Exam: Blood pressure 116/76, pulse 73, height 5\' 6"  (1.676 m), weight 150 lb 4 oz (68.2 kg), SpO2 97 %.  GEN:  Well nourished, well developed in no acute distress HEENT: Normal NECK: No JVD; No carotid bruits LYMPHATICS: No lymphadenopathy CARDIAC:  Irreg. Irreg.  RESPIRATORY:  Clear to auscultation without rales, wheezing or rhonchi  ABDOMEN: Soft, non-tender, non-distended MUSCULOSKELETAL:  No edema; No deformity  SKIN: Warm and dry NEUROLOGIC:  Alert and oriented x 3     Recent Labs: 06/21/2019: BUN 16; Creatinine, Ser 0.89; Hemoglobin 13.0; Platelets 265; Potassium 4.8; Sodium 138    Lipid Panel No results found for: CHOL, TRIG, HDL, CHOLHDL, VLDL, LDLCALC, LDLDIRECT    Wt Readings from Last 3 Encounters:   10/31/19 150 lb 4 oz (68.2 kg)  08/01/19 153 lb (69.4 kg)  06/27/19 150 lb (68 kg)      Other studies Reviewed:  EKG:    ASSESSMENT AND PLAN:  1.  Atrial fibrillation: She is in persistent atrial fibrillation now.  Continue Eliquis.  Her rate is well controlled and she really cannot tell that her heart rate is irregular.    2.   Hyperlipidemia:   -Managed by her primary medical doctor.  3. Carotid artery disease -she is status post right carotid endarterectomy.  Her carotids sound good.  Current medicines are reviewed at length with the patient today.  The patient does not have concerns regarding medicines.  The following changes have been made:  no change  Labs/ tests ordered today include: ECG   No orders of the defined types were placed in this encounter.   Disposition:    .    Signed, Mertie Moores, MD  10/31/2019 5:56 PM    [facility] [address], [place], [place]  [address] Phone: [phone]; Fax: [phone]

## 2019-11-18 ENCOUNTER — Other Ambulatory Visit (HOSPITAL_COMMUNITY): Payer: Self-pay | Admitting: *Deleted

## 2019-11-19 ENCOUNTER — Other Ambulatory Visit: Payer: Self-pay

## 2019-11-19 ENCOUNTER — Ambulatory Visit (HOSPITAL_COMMUNITY)
Admission: RE | Admit: 2019-11-19 | Discharge: 2019-11-19 | Disposition: A | Discharge status: Home / Self Care | Payer: Medicare Other | Source: Ambulatory Visit | Attending: Internal Medicine | Admitting: Internal Medicine

## 2019-11-19 DIAGNOSIS — M810 Age-related osteoporosis without current pathological fracture: Secondary | ICD-10-CM | POA: Diagnosis not present

## 2019-11-19 MED ORDER — DENOSUMAB 60 MG/ML ~~LOC~~ SOSY
60.0000 mg | PREFILLED_SYRINGE | Freq: Once | SUBCUTANEOUS | Status: AC
  Administered 2019-11-19: 60 mg via SUBCUTANEOUS

## 2019-11-19 MED ORDER — DENOSUMAB 60 MG/ML ~~LOC~~ SOSY
PREFILLED_SYRINGE | SUBCUTANEOUS | Status: AC
  Filled 2019-11-19: qty 1

## 2020-03-03 ENCOUNTER — Other Ambulatory Visit: Payer: Self-pay | Admitting: Cardiovascular Disease

## 2020-03-03 DIAGNOSIS — I4819 Other persistent atrial fibrillation: Secondary | ICD-10-CM

## 2020-03-03 NOTE — Telephone Encounter (Signed)
Prescription refill request for Eliquis received. Indication: PAF Last office visit: 10/31/19 Scr: 0.89  Age: 79 Weight: 68.2 kg

## 2020-03-12 DIAGNOSIS — E785 Hyperlipidemia, unspecified: Secondary | ICD-10-CM | POA: Diagnosis not present

## 2020-03-12 DIAGNOSIS — E559 Vitamin D deficiency, unspecified: Secondary | ICD-10-CM | POA: Diagnosis not present

## 2020-03-19 DIAGNOSIS — I6523 Occlusion and stenosis of bilateral carotid arteries: Secondary | ICD-10-CM | POA: Diagnosis not present

## 2020-03-19 DIAGNOSIS — E559 Vitamin D deficiency, unspecified: Secondary | ICD-10-CM | POA: Diagnosis not present

## 2020-03-19 DIAGNOSIS — E785 Hyperlipidemia, unspecified: Secondary | ICD-10-CM | POA: Diagnosis not present

## 2020-03-19 DIAGNOSIS — M8580 Other specified disorders of bone density and structure, unspecified site: Secondary | ICD-10-CM | POA: Diagnosis not present

## 2020-03-19 DIAGNOSIS — I10 Essential (primary) hypertension: Secondary | ICD-10-CM | POA: Diagnosis not present

## 2020-03-19 DIAGNOSIS — N1831 Chronic kidney disease, stage 3a: Secondary | ICD-10-CM | POA: Diagnosis not present

## 2020-03-19 DIAGNOSIS — Z0000 Encounter for general adult medical examination without abnormal findings: Secondary | ICD-10-CM | POA: Diagnosis not present

## 2020-03-19 DIAGNOSIS — Z7901 Long term (current) use of anticoagulants: Secondary | ICD-10-CM | POA: Diagnosis not present

## 2020-03-19 DIAGNOSIS — I129 Hypertensive chronic kidney disease with stage 1 through stage 4 chronic kidney disease, or unspecified chronic kidney disease: Secondary | ICD-10-CM | POA: Diagnosis not present

## 2020-03-19 DIAGNOSIS — H8110 Benign paroxysmal vertigo, unspecified ear: Secondary | ICD-10-CM | POA: Diagnosis not present

## 2020-03-19 DIAGNOSIS — R82998 Other abnormal findings in urine: Secondary | ICD-10-CM | POA: Diagnosis not present

## 2020-03-19 DIAGNOSIS — D6869 Other thrombophilia: Secondary | ICD-10-CM | POA: Diagnosis not present

## 2020-03-19 DIAGNOSIS — I480 Paroxysmal atrial fibrillation: Secondary | ICD-10-CM | POA: Diagnosis not present

## 2020-03-19 DIAGNOSIS — R197 Diarrhea, unspecified: Secondary | ICD-10-CM | POA: Diagnosis not present

## 2020-03-20 DIAGNOSIS — Z23 Encounter for immunization: Secondary | ICD-10-CM | POA: Diagnosis not present

## 2020-03-24 ENCOUNTER — Other Ambulatory Visit: Payer: Self-pay

## 2020-03-24 MED ORDER — METOPROLOL TARTRATE 25 MG PO TABS: 25.0000 mg | ORAL_TABLET | Freq: Two times a day (BID) | ORAL | 2 refills | Status: DC

## 2020-03-24 NOTE — Telephone Encounter (Signed)
Pt's medication was sent to pt's pharmacy as requested. Confirmation received.  °

## 2020-04-07 DIAGNOSIS — H4322 Crystalline deposits in vitreous body, left eye: Secondary | ICD-10-CM | POA: Diagnosis not present

## 2020-04-07 DIAGNOSIS — H43813 Vitreous degeneration, bilateral: Secondary | ICD-10-CM | POA: Diagnosis not present

## 2020-04-07 DIAGNOSIS — H31093 Other chorioretinal scars, bilateral: Secondary | ICD-10-CM | POA: Diagnosis not present

## 2020-04-07 DIAGNOSIS — H35412 Lattice degeneration of retina, left eye: Secondary | ICD-10-CM | POA: Diagnosis not present

## 2020-04-07 DIAGNOSIS — H04202 Unspecified epiphora, left lacrimal gland: Secondary | ICD-10-CM | POA: Diagnosis not present

## 2020-04-07 DIAGNOSIS — H1045 Other chronic allergic conjunctivitis: Secondary | ICD-10-CM | POA: Diagnosis not present

## 2020-04-07 DIAGNOSIS — H04123 Dry eye syndrome of bilateral lacrimal glands: Secondary | ICD-10-CM | POA: Diagnosis not present

## 2020-04-07 DIAGNOSIS — H2513 Age-related nuclear cataract, bilateral: Secondary | ICD-10-CM | POA: Diagnosis not present

## 2020-04-14 DIAGNOSIS — R197 Diarrhea, unspecified: Secondary | ICD-10-CM | POA: Diagnosis not present

## 2020-04-14 DIAGNOSIS — Z7901 Long term (current) use of anticoagulants: Secondary | ICD-10-CM | POA: Diagnosis not present

## 2020-04-18 DIAGNOSIS — Z23 Encounter for immunization: Secondary | ICD-10-CM | POA: Diagnosis not present

## 2020-04-27 DIAGNOSIS — C44329 Squamous cell carcinoma of skin of other parts of face: Secondary | ICD-10-CM | POA: Diagnosis not present

## 2020-04-27 DIAGNOSIS — D485 Neoplasm of uncertain behavior of skin: Secondary | ICD-10-CM | POA: Diagnosis not present

## 2020-04-30 ENCOUNTER — Encounter: Payer: Self-pay | Admitting: Cardiovascular Disease

## 2020-04-30 NOTE — Progress Notes (Signed)
Cardiology Office Note   Date:  05/01/2020   ID:  Monica TOVEY, DOB 20-May-1941, MRN [id number]  PCP:  Marton Redwood, MD  Cardiologist:   Mertie Moores, MD   Chief Complaint  Patient presents with  . Atrial Fibrillation      Monica Williamson is a 79 y.o. female who presents for follow-up of her paroxysmal atrial fibrillation.  She has a CHADS VASC 2 score of 3 and is on Xarelto .   She has had both  knees replaced.  She has had only 2 episodes of PAF - resolved very quickly with the propranolol.   Doing well.   Jan. 27, 2017:  Jan. 29, 2018:  Monica Williamson is seen today for follow up of her PAF .  Has had 3 episodes of PAF in December.  Takes a propranolol with resolution ( 1-2 tabs )  Typically occurs at night ,  Lasts perhaps 30 minutes.   Aug. 29, 2018:  Monica Williamson is doing well Has had several episodes of paroxysmal atrial fib.  Doing well Leaving for [place] in 2 weeks.    Aug. 29, 2019 Monica Williamson is seen today for follow-up of her paroxysmal atrial fibrillation.   Monica Williamson came to her appt today .   I last saw her in August, 2018.  She saw Monica Williamson, [place] in January, 2019 for some palpitations.  She is found to be in normal sinus rhythm and was reassured.  She is been on Xarelto.  Diltiazem seems to control her atrial fibrillation fairly well.  BP has been quite variable recently - has had low diastolic BP readings. Saw Dr. Brigitte Pulse.    Was having some dizziness.   Also had ringing in her ears.   con  HCTZ was stopped.   Now on Valsartan. LDL was found to be slightly elevated - 85.   Was started on Zetia.  Scheduled to have repeat lipids drawn in November. Has had more palpitations.  Takes propranolol with resolution of her symptoms  Thinks she is having more atiral fib.  She cannot really feel any palpitations - just gets a notification when her BP says she has an irregular HR   Has moderate carotid disease.  She has been seen by the vein and vascular  specialist.  She is scheduled for repeat duplex scan in November.  Sept. 11, 2020   Monica Williamson is seen today for follow up of her PAF, hyperlipidemia   She is had right carotid endarterectomy since I last saw her. She is in atrial fib today .   Cannot feel the HR irregularity .   May 22, 2019: Monica Williamson is seen today for follow-up visit.  She has a history of paroxysmal atrial fibrillation.  She has mild hyperlipidemia.  She has moderate carotid disease and has had a right carotid endarterectomy.  Has gotten a Kardia monitor Has Afib frequently  Tried propranolol - did not seem toaffect the AF We started metoprolol ,  Is on xarelto  She held her xarelto back on       For a back injection  October 31, 2019  Monica Williamson is seen today for follow up of her afib  She has a hx of carotid stenosis and is s/p CEA. We performed a cardioversion Dec. 2020 but she went back into AF several days later  She is doing well.  Takes her eliquis BID   May 01, 2020: Monica Williamson is seen today for follow-up of her atrial fibrillation and carotid endarterectomy.  We performed a cardioversion in December, 2020 but she went back into atrial fibrillation.  She remains asymptomatic and cannot even tell that she is in A. Fib. Walks every day with her dog,  Exercises twice a week at [employer].  She is not interested in doing another cardioversion    Past Medical History:  Diagnosis Date  . Anxiety   . Arthritis   . Atrial fibrillation ([place])    when takes Warfarin  . Atrial fibrillation, currently in sinus rhythm   . Bronchiectasis ([place])    contolled with lovent and allergy med  . Childhood asthma   . Cholelithiasis   . Colon polyps   . Complication of anesthesia   . Depression   . Diverticulosis   . Dysrhythmia    sees Dr Acie Fredrickson annually for h/o Afib  . H/O bronchiectasis   . History of airborne allergies    uses inhalers  . Hypertension   . Nocturia   . Osteopenia   . PONV (postoperative nausea and vomiting)      severe n/v post anesthesia   . Pulmonary nodule   . Sleep related teeth grinding    wears a mouth guard at night  . Stress fracture of tibia 2014  . Vitamin D deficiency     Past Surgical History:  Procedure Laterality Date  . BREAST CYST EXCISION Right    over 20 years ago  . BREAST SURGERY    . CARDIOVERSION N/A 06/27/2019   Procedure: CARDIOVERSION;  Surgeon: Acie Fredrickson Wonda Cheng, MD;  Location: [facility] ENDOSCOPY;  Service: Cardiovascular;  Laterality: N/A;  . CHOLECYSTECTOMY     11/2010  . INCISION AND DRAINAGE BREAST ABSCESS    . JOINT REPLACEMENT Right 09/2013   knee  . MENISCUS REPAIR Right 2014  . 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   Procedure: RIGHT TOTAL KNEE ARTHROPLASTY;  Surgeon: Vickey Huger, MD;  Location: [facility];  Service: Orthopedics;  Laterality: Right;  . 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 TOTAL KNEE ARTHROPLASTY Left 02/24/2014   Procedure: TOTAL KNEE ARTHROPLASTY;  Surgeon: Vickey Huger, MD;  Location: [facility];  Service: Orthopedics;  Laterality: Left;  Monica Williamson VAGINAL HYSTERECTOMY    . VESICOVAGINAL FISTULA CLOSURE W/ TAH       Current Outpatient Medications  Medication Sig Dispense Refill  . acetaminophen (TYLENOL) 500 MG tablet Take 1,000 mg by mouth every 6 (six) hours as needed for moderate pain or headache.    . albuterol (PROVENTIL HFA;VENTOLIN HFA) 108 (90 Base) MCG/ACT inhaler Inhale 2 puffs into the lungs 2 (two) times daily as needed for wheezing or shortness of breath. 1 Inhaler 5  . ALPRAZolam (XANAX) 0.5 MG tablet Take 0.25 mg by mouth at bedtime as needed for anxiety or sleep.     . Ascorbic Acid (VITAMIN C PO) Take 1 tablet by mouth daily.    Monica Williamson atorvastatin (LIPITOR) 40 MG tablet Take 40 mg by mouth daily at 6 PM.     . buPROPion (WELLBUTRIN XL) 150 MG 24 hr tablet Take 150 mg by mouth every morning.  30 tablet 11  . cetirizine (ZYRTEC) 10 MG tablet Take 10 mg by mouth daily.    . Cholecalciferol (VITAMIN D-1000 MAX ST) 25 MCG (1000 UT)  tablet Take 1,000 Units by mouth daily.    Monica Williamson diltiazem (DILACOR XR) 180 MG 24 hr capsule TAKE 1 CAPSULE DAILY 90 capsule 2  . ELIQUIS 5 MG TABS tablet TAKE 1 TABLET TWICE  A DAY 180 tablet 3  . ezetimibe (ZETIA) 10 MG tablet Take 10 mg by mouth daily.     . fluticasone (FLONASE) 50 MCG/ACT nasal spray Place 2 sprays into both nostrils daily. 16 g 5  . fluticasone (FLOVENT HFA) 110 MCG/ACT inhaler Inhale 2 puffs into the lungs 2 (two) times daily as needed (for shortness of breath). 1 Inhaler 5  . metoprolol tartrate (LOPRESSOR) 25 MG tablet Take 1 tablet (25 mg total) by mouth 2 (two) times daily. 180 tablet 2  . propranolol (INDERAL) 10 MG tablet Take 1 tablet (10 mg total) by mouth 4 (four) times daily as needed (FOR PALPITATIONS Can take ONE every 30 minutes Up to 4 Doses). 60 tablet 11  . psyllium (METAMUCIL) 58.6 % powder Take 1 packet by mouth daily.     No current facility-administered medications for this visit.    Allergies:   Warfarin and related, Augmentin [amoxicillin-pot clavulanate], and Avelox [moxifloxacin]    Social History:  The patient  reports that she quit smoking about 41 years ago. She has a 15.00 pack-year smoking history. She has never used smokeless tobacco. She reports that she does not use drugs.   Family History:  The patient's family history includes Heart disease in her sister.    ROS:  Please see the history of present illness.   Otherwise, review of systems are positive for none.   All other systems are reviewed and negative.    Physical Exam: Blood pressure 102/76, pulse 80, height 5\' 6"  (1.676 m), weight 149 lb (67.6 kg), SpO2 96 %.  GEN:  Well nourished, well developed in no acute distress HEENT: Normal NECK: No JVD; No carotid bruits LYMPHATICS: No lymphadenopathy CARDIAC: irreg. Irreg.  RESPIRATORY:  Clear to auscultation without rales, wheezing or rhonchi  ABDOMEN: Soft, non-tender, non-distended MUSCULOSKELETAL:  No edema; No deformity  SKIN:  Warm and dry NEUROLOGIC:  Alert and oriented x 3   Recent Labs: 06/21/2019: BUN 16; Creatinine, Ser 0.89; Hemoglobin 13.0; Platelets 265; Potassium 4.8; Sodium 138    Lipid Panel No results found for: CHOL, TRIG, HDL, CHOLHDL, VLDL, LDLCALC, LDLDIRECT    Wt Readings from Last 3 Encounters:  05/01/20 149 lb (67.6 kg)  10/31/19 150 lb 4 oz (68.2 kg)  08/01/19 153 lb (69.4 kg)      Other studies Reviewed:  EKG:    ASSESSMENT AND PLAN:  1.  Atrial fibrillation:   Is in persistent/chronic atrial fibrillation.  Her rate is well controlled.  We tried a cardioversion in December, 2020.  She is not interested in trying cardioversion again at this point.  She has normal left ventricular systolic function.  Left atrium is mildly enlarged.    2.   Hyperlipidemia:    Managed by her primary medical doctor.  3. Carotid artery disease -status post right carotid endarterectomy.   Current medicines are reviewed at length with the patient today.  The patient does not have concerns regarding medicines.  The following changes have been made:  no change  Labs/ tests ordered today include: ECG    Orders Placed This Encounter  Procedures  . EKG 12-Lead    Disposition:    .    Signed, Mertie Moores, MD  05/01/2020 1:57 PM    [facility] [address], [place], [place]  [address] Phone: [phone]; Fax: [phone]

## 2020-05-01 ENCOUNTER — Ambulatory Visit (INDEPENDENT_AMBULATORY_CARE_PROVIDER_SITE_OTHER): Payer: Medicare Other | Admitting: Cardiovascular Disease

## 2020-05-01 ENCOUNTER — Encounter: Payer: Self-pay | Admitting: Cardiovascular Disease

## 2020-05-01 ENCOUNTER — Other Ambulatory Visit: Payer: Self-pay

## 2020-05-01 VITALS — BP 102/76 | HR 80 | Ht 66.0 in | Wt 149.0 lb

## 2020-05-01 DIAGNOSIS — I6523 Occlusion and stenosis of bilateral carotid arteries: Secondary | ICD-10-CM | POA: Diagnosis not present

## 2020-05-01 DIAGNOSIS — E785 Hyperlipidemia, unspecified: Secondary | ICD-10-CM

## 2020-05-01 DIAGNOSIS — Z7901 Long term (current) use of anticoagulants: Secondary | ICD-10-CM | POA: Diagnosis not present

## 2020-05-01 DIAGNOSIS — I4819 Other persistent atrial fibrillation: Secondary | ICD-10-CM | POA: Diagnosis not present

## 2020-05-01 NOTE — Patient Instructions (Signed)

## 2020-05-08 DIAGNOSIS — Z1159 Encounter for screening for other viral diseases: Secondary | ICD-10-CM | POA: Diagnosis not present

## 2020-06-30 DIAGNOSIS — R143 Flatulence: Secondary | ICD-10-CM | POA: Diagnosis not present

## 2020-06-30 DIAGNOSIS — Z7901 Long term (current) use of anticoagulants: Secondary | ICD-10-CM | POA: Diagnosis not present

## 2020-06-30 DIAGNOSIS — R197 Diarrhea, unspecified: Secondary | ICD-10-CM | POA: Diagnosis not present

## 2020-09-07 ENCOUNTER — Other Ambulatory Visit: Payer: Self-pay | Admitting: Internal Medicine

## 2020-09-07 ENCOUNTER — Encounter (HOSPITAL_COMMUNITY): Payer: Medicare Other

## 2020-09-07 DIAGNOSIS — Z1231 Encounter for screening mammogram for malignant neoplasm of breast: Secondary | ICD-10-CM

## 2020-09-11 ENCOUNTER — Other Ambulatory Visit (HOSPITAL_COMMUNITY): Payer: Self-pay | Admitting: *Deleted

## 2020-09-14 ENCOUNTER — Ambulatory Visit (HOSPITAL_COMMUNITY)
Admission: RE | Admit: 2020-09-14 | Discharge: 2020-09-14 | Disposition: A | Discharge status: Home / Self Care | Payer: Medicare Other | Source: Ambulatory Visit | Attending: Internal Medicine | Admitting: Internal Medicine

## 2020-09-14 ENCOUNTER — Other Ambulatory Visit: Payer: Self-pay

## 2020-09-14 DIAGNOSIS — M810 Age-related osteoporosis without current pathological fracture: Secondary | ICD-10-CM | POA: Insufficient documentation

## 2020-09-14 MED ORDER — DENOSUMAB 60 MG/ML ~~LOC~~ SOSY
PREFILLED_SYRINGE | SUBCUTANEOUS | Status: AC
  Administered 2020-09-14: 60 mg via SUBCUTANEOUS
  Filled 2020-09-14: qty 1

## 2020-09-14 MED ORDER — DENOSUMAB 60 MG/ML ~~LOC~~ SOSY: 60.0000 mg | PREFILLED_SYRINGE | Freq: Once | SUBCUTANEOUS | Status: AC

## 2020-09-25 ENCOUNTER — Other Ambulatory Visit: Payer: Self-pay | Admitting: Cardiovascular Disease

## 2020-10-27 ENCOUNTER — Ambulatory Visit
Admission: RE | Admit: 2020-10-27 | Discharge: 2020-10-27 | Disposition: A | Discharge status: Home / Self Care | Payer: Medicare Other | Source: Ambulatory Visit | Attending: Internal Medicine | Admitting: Internal Medicine

## 2020-10-27 ENCOUNTER — Other Ambulatory Visit: Payer: Self-pay

## 2020-10-27 DIAGNOSIS — Z1231 Encounter for screening mammogram for malignant neoplasm of breast: Secondary | ICD-10-CM

## 2020-12-23 ENCOUNTER — Other Ambulatory Visit: Payer: Self-pay | Admitting: Cardiovascular Disease

## 2020-12-23 DIAGNOSIS — I4819 Other persistent atrial fibrillation: Secondary | ICD-10-CM

## 2020-12-23 NOTE — Telephone Encounter (Signed)
Pt last saw Dr Acie Fredrickson 05/01/20, last labs 03/12/20 Creat 0.9, age 80, weight 67.6kg, based on specified criteria pt is on appropriate dosage of Eliquis 5mg  BID.  Will refill rx.

## 2021-01-04 ENCOUNTER — Telehealth: Payer: Self-pay | Admitting: Cardiovascular Disease

## 2021-01-04 ENCOUNTER — Other Ambulatory Visit: Payer: Self-pay | Admitting: *Deleted

## 2021-01-04 MED ORDER — METOPROLOL TARTRATE 25 MG PO TABS: 25.0000 mg | ORAL_TABLET | Freq: Two times a day (BID) | ORAL | 0 refills | Status: DC

## 2021-01-04 NOTE — Telephone Encounter (Signed)
Rx has been sent in as requested, Confirmation received.

## 2021-01-04 NOTE — Telephone Encounter (Signed)
*  STAT* If patient is at the pharmacy, call can be transferred to refill team.   1. Which medications need to be refilled? (please list name of each medication and dose if known) Metoprolol  2. Which pharmacy/location (including street and city if local pharmacy) is medication to be sent to? [place], [place]  3. Do they need a 30 day or 90 day supply? 90 days and refills

## 2021-04-12 ENCOUNTER — Other Ambulatory Visit: Payer: Self-pay | Admitting: Cardiovascular Disease

## 2021-04-23 ENCOUNTER — Encounter (HOSPITAL_COMMUNITY): Payer: Medicare Other

## 2021-04-27 ENCOUNTER — Other Ambulatory Visit (HOSPITAL_COMMUNITY): Payer: Self-pay | Admitting: *Deleted

## 2021-04-28 ENCOUNTER — Other Ambulatory Visit: Payer: Self-pay

## 2021-04-28 ENCOUNTER — Ambulatory Visit (HOSPITAL_COMMUNITY)
Admission: RE | Admit: 2021-04-28 | Discharge: 2021-04-28 | Disposition: A | Discharge status: Home / Self Care | Payer: Medicare Other | Source: Ambulatory Visit | Attending: Internal Medicine | Admitting: Internal Medicine

## 2021-04-28 DIAGNOSIS — M810 Age-related osteoporosis without current pathological fracture: Secondary | ICD-10-CM | POA: Diagnosis present

## 2021-04-28 MED ORDER — DENOSUMAB 60 MG/ML ~~LOC~~ SOSY
PREFILLED_SYRINGE | SUBCUTANEOUS | Status: AC
  Filled 2021-04-28: qty 1

## 2021-04-28 MED ORDER — DENOSUMAB 60 MG/ML ~~LOC~~ SOSY
60.0000 mg | PREFILLED_SYRINGE | Freq: Once | SUBCUTANEOUS | Status: AC
  Administered 2021-04-28: 60 mg via SUBCUTANEOUS

## 2021-05-16 ENCOUNTER — Encounter: Payer: Self-pay | Admitting: Cardiovascular Disease

## 2021-05-16 NOTE — Progress Notes (Signed)
Cardiology Office Note   Date:  05/17/2021   ID:  Monica Williamson, Monica Williamson 03-Feb-1941, MRN [id number]  PCP:  Ginger Organ., MD  Cardiologist:   Mertie Moores, MD   Chief Complaint  Patient presents with   Atrial Fibrillation           Monica Williamson is a 80 y.o. female who presents for follow-up of her paroxysmal atrial fibrillation.  She has a CHADS VASC 2 score of 3 and is on Xarelto .   She has had both  knees replaced.  She has had only 2 episodes of PAF - resolved very quickly with the propranolol.   Doing well.   Jan. 27, 2017:  Jan. 29, 2018:  Monica Williamson is seen today for follow up of her PAF .  Has had 3 episodes of PAF in December.  Takes a propranolol with resolution ( 1-2 tabs )  Typically occurs at night ,  Lasts perhaps 30 minutes.   Aug. 29, 2018:  Monica Williamson is doing well Has had several episodes of paroxysmal atrial fib.  Doing well Leaving for [place] in 2 weeks.    Aug. 29, 2019 Monica Williamson is seen today for follow-up of her paroxysmal atrial fibrillation.   Monica Williamson came to her appt today .   I last saw her in August, 2018.  She saw Monica Williamson, [place] in January, 2019 for some palpitations.  She is found to be in normal sinus rhythm and was reassured.  She is been on Xarelto.  Diltiazem seems to control her atrial fibrillation fairly well.  BP has been quite variable recently - has had low diastolic BP readings. Saw Dr. Brigitte Pulse.    Was having some dizziness.   Also had ringing in her ears.   con  HCTZ was stopped.   Now on Valsartan. LDL was found to be slightly elevated - 85.   Was started on Zetia.  Scheduled to have repeat lipids drawn in November. Has had more palpitations.  Takes propranolol with resolution of her symptoms  Thinks she is having more atiral fib.  She cannot really feel any palpitations - just gets a notification when her BP says she has an irregular HR   Has moderate carotid disease.  She has been seen by the vein and  vascular specialist.  She is scheduled for repeat duplex scan in November.  Sept. 11, 2020   Monica Williamson is seen today for follow up of her PAF, hyperlipidemia   She is had right carotid endarterectomy since I last saw her. She is in atrial fib today .   Cannot feel the HR irregularity .   May 22, 2019: Monica Williamson is seen today for follow-up visit.  She has a history of paroxysmal atrial fibrillation.  She has mild hyperlipidemia.  She has moderate carotid disease and has had a right carotid endarterectomy.  Has gotten a Kardia monitor Has Afib frequently  Tried propranolol - did not seem toaffect the AF We started metoprolol ,  Is on xarelto  She held her xarelto back on       For a back injection  October 31, 2019  Monica Williamson is seen today for follow up of her afib  She has a hx of carotid stenosis and is s/p CEA. We performed a cardioversion Dec. 2020 but she went back into AF several days later  She is doing well.  Takes her eliquis BID   May 01, 2020: Monica Williamson is seen today for follow-up  of her atrial fibrillation and carotid endarterectomy.  We performed a cardioversion in December, 2020 but she went back into atrial fibrillation.  She remains asymptomatic and cannot even tell that she is in A. Fib. Walks every day with her dog,  Exercises twice a week at [employer].  She is not interested in doing another cardioversion   Oct. 31, 2022: Monica Williamson is seen today for follow up of her Afib and CEA She cannot tell that she is in atrial fib  We have tried cardioversion in the past   Past Medical History:  Diagnosis Date   Anxiety    Arthritis    Atrial fibrillation ([place])    when takes Warfarin   Atrial fibrillation, currently in sinus rhythm    Bronchiectasis ([place])    contolled with lovent and allergy med   Childhood asthma    Cholelithiasis    Colon polyps    Complication of anesthesia    Depression    Diverticulosis    Dysrhythmia    sees Dr Acie Fredrickson annually for h/o Afib   H/O  bronchiectasis    History of airborne allergies    uses inhalers   Hypertension    Nocturia    Osteopenia    PONV (postoperative nausea and vomiting)    severe n/v post anesthesia    Pulmonary nodule    Sleep related teeth grinding    wears a mouth guard at night   Stress fracture of tibia 2014   Vitamin D deficiency     Past Surgical History:  Procedure Laterality Date   BREAST CYST EXCISION Right    over 20 years ago   [place] N/A 06/27/2019   Procedure: CARDIOVERSION;  Surgeon: Francys Bolin, Wonda Cheng, MD;  [facility];  Service: Cardiovascular;  Laterality: N/A;   CHOLECYSTECTOMY     11/2010   INCISION AND DRAINAGE BREAST ABSCESS     JOINT REPLACEMENT Right 09/2013   knee   MENISCUS REPAIR Right 2014   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   Procedure: RIGHT TOTAL KNEE ARTHROPLASTY;  Surgeon: Vickey Huger, MD;  Location: [place];  Service: Orthopedics;  Laterality: Right;   TOTAL KNEE ARTHROPLASTY Left 02/24/2014   dr Ronnie Derby   TOTAL KNEE ARTHROPLASTY Left 02/24/2014   Procedure: TOTAL KNEE ARTHROPLASTY;  Surgeon: Vickey Huger, MD;  Location: [place];  Service: Orthopedics;  Laterality: Left;   VAGINAL HYSTERECTOMY     VESICOVAGINAL FISTULA CLOSURE W/ TAH       Current Outpatient Medications  Medication Sig Dispense Refill   acetaminophen (TYLENOL) 500 MG tablet Take 1,000 mg by mouth every 6 (six) hours as needed for moderate pain or headache.     albuterol (PROVENTIL HFA;VENTOLIN HFA) 108 (90 Base) MCG/ACT inhaler Inhale 2 puffs into the lungs 2 (two) times daily as needed for wheezing or shortness of breath. 1 Inhaler 5   ALPRAZolam (XANAX) 0.5 MG tablet Take 0.25 mg by mouth at bedtime as needed for anxiety or sleep.      Ascorbic Acid (VITAMIN C PO) Take 1 tablet by mouth daily.     atorvastatin (LIPITOR) 40 MG tablet Take 40 mg by mouth daily at 6 PM.      buPROPion (WELLBUTRIN XL) 150 MG 24 hr tablet Take 150 mg by mouth every morning.  30  tablet 11   cetirizine (ZYRTEC) 10 MG tablet Take 10 mg by mouth daily.     Cholecalciferol (VITAMIN D-1000 MAX ST) 25  MCG (1000 UT) tablet Take 1,000 Units by mouth daily.     DILT-XR 180 MG 24 hr capsule TAKE TAKE 1 CAPSULE BY  MOUTH DAILY 90 capsule 3   ELIQUIS 5 MG TABS tablet TAKE 1 TABLET BY MOUTH  TWICE A DAY 180 tablet 1   ezetimibe (ZETIA) 10 MG tablet Take 10 mg by mouth daily.      fluticasone (FLONASE) 50 MCG/ACT nasal spray Place 2 sprays into both nostrils daily. 16 g 5   fluticasone (FLOVENT HFA) 110 MCG/ACT inhaler Inhale 2 puffs into the lungs 2 (two) times daily as needed (for shortness of breath). 1 Inhaler 5   propranolol (INDERAL) 10 MG tablet Take 1 tablet (10 mg total) by mouth 4 (four) times daily as needed (FOR PALPITATIONS Can take ONE every 30 minutes Up to 4 Doses). 60 tablet 11   psyllium (METAMUCIL) 58.6 % powder Take 1 packet by mouth daily.     metoprolol tartrate (LOPRESSOR) 25 MG tablet Take 1 tablet (25 mg total) by mouth 2 (two) times daily. Please keep upcoming appt for future refills Thank you 180 tablet 2   No current facility-administered medications for this visit.    Allergies:   Warfarin and related, Amoxicillin, Augmentin [amoxicillin-pot clavulanate], and Avelox [moxifloxacin]    Social History:  The patient  reports that she quit smoking about 42 years ago. Her smoking use included cigarettes. She has a 15.00 pack-year smoking history. She has never used smokeless tobacco. She reports that she does not use drugs.   Family History:  The patient's family history includes Heart disease in her sister.    ROS:  Please see the history of present illness.   Otherwise, review of systems are positive for none.   All other systems are reviewed and negative.    Physical Exam: Blood pressure 102/72, pulse 74, height 5\' 6"  (1.676 m), weight 153 lb 6.4 oz (69.6 kg), SpO2 100 %.  GEN:  Well nourished, well developed in no acute distress HEENT:  Normal NECK: No JVD; No carotid bruits LYMPHATICS: No lymphadenopathy CARDIAC: irreg. Irreg. , no murmurs, rubs, gallops RESPIRATORY:  Clear to auscultation without rales, wheezing or rhonchi  ABDOMEN: Soft, non-tender, non-distended MUSCULOSKELETAL:  No edema; No deformity  SKIN: Warm and dry NEUROLOGIC:  Alert and oriented x 3    Recent Labs: No results found for requested labs within last 8760 hours.    Lipid Panel No results found for: CHOL, TRIG, HDL, CHOLHDL, VLDL, LDLCALC, LDLDIRECT    Wt Readings from Last 3 Encounters:  05/17/21 153 lb 6.4 oz (69.6 kg)  05/01/20 149 lb (67.6 kg)  10/31/19 150 lb 4 oz (68.2 kg)      Other studies Reviewed:  EKG:    oct. 31, 2022:    Atrial fib with HR of 61.  Controlled rate  ASSESSMENT AND PLAN:  1.  Atrial fibrillation:   Monica Williamson is doing well.  She is in persistent atrial fibrillation.  Her rate is well controlled.  We tried cardioversion in the past but she went back into atrial fibrillation fairly quickly.  At this point we will continue with rate control and anticoagulation.    2.   Hyperlipidemia:      3. Carotid artery disease -stable.  No symptoms.   I will see her again in 1 year.  Current medicines are reviewed at length with the patient today.  The patient does not have concerns regarding medicines.  The following changes have been  made:  no change  Labs/ tests ordered today include: ECG    No orders of the defined types were placed in this encounter.    Disposition:    .    Signed, Mertie Moores, MD  05/17/2021 3:13 PM    [facility] [address], [place], [place]  [address] Phone: [phone]; Fax: [phone]

## 2021-05-17 ENCOUNTER — Other Ambulatory Visit: Payer: Self-pay

## 2021-05-17 ENCOUNTER — Encounter: Payer: Self-pay | Admitting: Cardiovascular Disease

## 2021-05-17 ENCOUNTER — Ambulatory Visit: Payer: Medicare Other | Admitting: Cardiovascular Disease

## 2021-05-17 VITALS — BP 102/72 | HR 74 | Ht 66.0 in | Wt 153.4 lb

## 2021-05-17 DIAGNOSIS — I480 Paroxysmal atrial fibrillation: Secondary | ICD-10-CM

## 2021-05-17 DIAGNOSIS — I4819 Other persistent atrial fibrillation: Secondary | ICD-10-CM | POA: Diagnosis not present

## 2021-05-17 MED ORDER — METOPROLOL TARTRATE 25 MG PO TABS: 25.0000 mg | ORAL_TABLET | Freq: Two times a day (BID) | ORAL | 2 refills | Status: DC

## 2021-05-17 NOTE — Patient Instructions (Signed)
Medication Instructions:  Your physician recommends that you continue on your current medications as directed. Please refer to the Current Medication list given to you today.  *If you need a refill on your cardiac medications before your next appointment, please call your pharmacy*   Lab Work: NONE If you have labs (blood work) drawn today and your tests are completely normal, you will receive your results only by: [place] (if you have MyChart) OR A paper copy in the mail If you have any lab test that is abnormal or we need to change your treatment, we will call you to review the results.   Testing/Procedures: NONE   Follow-Up: At [facility], you and your health needs are our priority.  As part of our continuing mission to provide you with exceptional heart care, we have created designated Provider Care Teams.  These Care Teams include your primary Cardiologist (physician) and Advanced Practice Providers (APPs -  Physician Assistants and Nurse Practitioners) who all work together to provide you with the care you need, when you need it.  We recommend signing up for the patient portal called "MyChart".  Sign up information is provided on this After Visit Summary.  MyChart is used to connect with patients for Virtual Visits (Telemedicine).  Patients are able to view lab/test results, encounter notes, upcoming appointments, etc.  Non-urgent messages can be sent to your provider as well.   To learn more about what you can do with MyChart, go to [email or web address].    Your next appointment:   1 year(s)  The format for your next appointment:   In Person  Provider:   You may see Mertie Moores, MD or one of the following Advanced Practice Providers on your designated Care Team:   Richardson Dopp, PA-C [place], [place]

## 2021-05-20 NOTE — Addendum Note (Signed)
Addended by: Lynn Ito on: 05/20/2021 11:19 AM   Modules accepted: Orders

## 2021-06-01 ENCOUNTER — Other Ambulatory Visit: Payer: Self-pay

## 2021-06-01 ENCOUNTER — Ambulatory Visit (HOSPITAL_BASED_OUTPATIENT_CLINIC_OR_DEPARTMENT_OTHER): Payer: Medicare Other | Attending: Internal Medicine | Admitting: Physical Therapy

## 2021-06-01 ENCOUNTER — Encounter (HOSPITAL_BASED_OUTPATIENT_CLINIC_OR_DEPARTMENT_OTHER): Payer: Self-pay | Admitting: Physical Therapy

## 2021-06-01 DIAGNOSIS — M6281 Muscle weakness (generalized): Secondary | ICD-10-CM | POA: Diagnosis present

## 2021-06-01 DIAGNOSIS — M545 Low back pain, unspecified: Secondary | ICD-10-CM | POA: Insufficient documentation

## 2021-06-01 NOTE — Therapy (Signed)
OUTPATIENT PHYSICAL THERAPY THORACOLUMBAR EVALUATION   Patient Name: LAPORCHA MARCHESI MRN: [id number] DOB:07/24/1940, 80 y.o., female Today's Date: 06/01/2021   PT End of Session - 06/01/21 1253     Visit Number 1    Number of Visits 12    Date for PT Re-Evaluation 08/30/21    Authorization Type UHC Medicare    PT Start Time 1105    PT Stop Time 1145    PT Time Calculation (min) 40 min    Activity Tolerance Patient tolerated treatment well    Behavior During Therapy WFL for tasks assessed/performed             Past Medical History:  Diagnosis Date   Anxiety    Arthritis    Atrial fibrillation ([place])    when takes Warfarin   Atrial fibrillation, currently in sinus rhythm    Bronchiectasis ([facility])    contolled with lovent and allergy med   Childhood asthma    Cholelithiasis    Colon polyps    Complication of anesthesia    Depression    Diverticulosis    Dysrhythmia    sees Dr Acie Fredrickson annually for h/o Afib   H/O bronchiectasis    History of airborne allergies    uses inhalers   Hypertension    Nocturia    Osteopenia    PONV (postoperative nausea and vomiting)    severe n/v post anesthesia    Pulmonary nodule    Sleep related teeth grinding    wears a mouth guard at night   Stress fracture of tibia 2014   Vitamin D deficiency    Past Surgical History:  Procedure Laterality Date   BREAST CYST EXCISION Right    over 20 years ago   [place] N/A 06/27/2019   Procedure: CARDIOVERSION;  Surgeon: Nahser, Wonda Cheng, MD;  Location: [place];  Service: Cardiovascular;  Laterality: N/A;   CHOLECYSTECTOMY     11/2010   INCISION AND DRAINAGE BREAST ABSCESS     JOINT REPLACEMENT Right 09/2013   knee   MENISCUS REPAIR Right 2014   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   Procedure: RIGHT TOTAL KNEE ARTHROPLASTY;  Surgeon: Vickey Huger, MD;  Location: [place];  Service: Orthopedics;  Laterality: Right;   TOTAL KNEE ARTHROPLASTY Left 02/24/2014    dr Ronnie Derby   TOTAL KNEE ARTHROPLASTY Left 02/24/2014   Procedure: TOTAL KNEE ARTHROPLASTY;  Surgeon: Vickey Huger, MD;  Location: [place];  Service: Orthopedics;  Laterality: Left;   VAGINAL HYSTERECTOMY     VESICOVAGINAL FISTULA CLOSURE W/ TAH     Patient Active Problem List   Diagnosis Date Noted   Persistent atrial fibrillation ([place])    Carotid artery disease ([place]) 08/15/2016   Post-nasal drip 03/30/2016   History of acute epiglottitis 03/30/2016   Stridor 02/28/2016   Sepsis ([place]) 02/28/2016   Epiglottitis 02/28/2016   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   Long term (current) use of anticoagulants 10/01/2012   PAF (paroxysmal atrial fibrillation) ([place]) 09/25/2012   Chest pain 10/26/2010   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   BRONCHIECTASIS 08/13/2007   ALLERGIC RHINITIS 05/12/2007   Extrinsic asthma 05/12/2007   ABSCESS, BREAST 05/12/2007   Chronic cough 05/12/2007   HYSTERECTOMY, VAGINAL, HX OF 05/12/2007    PCP: Ginger Organ., MD  REFERRING PROVIDER: Ginger Organ., MD  REFERRING DIAG: M54.50 (ICD-10-CM) - Low back pain, unspecified  THERAPY DIAG:  Pain, lumbar region  Muscle weakness (generalized)  ONSET DATE: October 2022  SUBJECTIVE:                                                                                                                                                                                           SUBJECTIVE STATEMENT: Pt states the initial injury happened while she was using the leg press. She felt something hurt. She then ocntinued with her [employer] gym exercises and continued to have pain throughout her workout. The next morning, she has increased spasm that last for 2-3 day. She conttinued with chair yoga nad water fit classes. She no longer has spasm but there is "always discomfort across the low back." She states that standing for long periods of time in the kitchen will cause LBP as well as lower thoracic muscle pain. The pain will  take her breathe away. She is able to touch the pain. She states she has had "poor" posture for a long time. She is former Programme researcher, broadcasting/film/video. Pt denies radiating pain down legs. Pt denies NT. Pt denies BB changes. Pt denies saddle anesthesia.   PERTINENT HISTORY:  Bilat TKA, osteoporosis,   PAIN:  Are you having pain? No VAS scale: 5/10, Worst 8/10,  Pain location: L/S and lower T/S Pain orientation: Bilateral  PAIN TYPE: dull Pain description: constant  Aggravating factors: standing for too long, lifting, transfers  Relieving factors: Tylenol, chair  PRECAUTIONS: None  WEIGHT BEARING RESTRICTIONS No  FALLS:  Has patient fallen in last 6 months? No, Number of falls: 0  LIVING ENVIRONMENT: Lives with: lives with their family and lives with their spouse Lives in: House/apartment Stairs: Yes; garage steps, 2 story home Has following equipment at home: None  PLOF: Independent  Hobbies: pickleball, working out, golfing   PATIENT GOALS : Pt states biggest goal is to lessen pain.    OBJECTIVE:   DIAGNOSTIC FINDINGS:  No recent imaging in chart  PATIENT SURVEYS:  FOTO 72 78 at D/C  MCII 5pts   COGNITION:  Overall cognitive status: Within functional limits for tasks assessed     SENSATION:  Light touch: Appears intact     POSTURE:  Kyphotic, reduce lumbar lordosis  LUMBARAROM/PROM  A/PROM A/PROM  06/01/2021  Flexion 75%   Extension WFL  Right lateral flexion WFL  Left lateral flexion WFL more intense stretch  Right rotation 75%with p!  Left rotation wFL   (Blank rows = not tested)  LE AROM/PROM:  WFL for tasks assessed in clinic  LE MMT:  MMT Right 06/01/2021 Left 06/01/2021  Hip flexion 4/5 4/5  Hip extension 4/5 4/5  Hip abduction 4+/5 4+/5  Hip adduction 4+/5  4+/5  Knee flexion 4+/5 4+/5  Knee extension 5/5 5/5   (Blank rows = not tested)  LUMBAR SPECIAL TESTS:  Slump test: Negative, FABER test: Negative, and Trendelenburg sign:  Negative  SPINAL SEGMENTAL MOBILITY ASSESSMENT:  T8-L5 extension glide and closing restriction with CPA and UPA TTP and hypertonicity of L paraspinals, very tender to palp L paraspinal and QL L3-4  FUNCTIONAL TESTS:  5 times sit to stand: performed with UE support  GAIT: Distance walked: 58ft Assistive device utilized: None Level of assistance: Complete Independence Comments: decreased hip rotation, largely WFL    TODAY'S TREATMENT  Exercises Supine Lower Trunk Rotation - 2 x daily - 7 x weekly - 2 sets - 10 reps - 3 hold Supine Posterior Pelvic Tilt - 2 x daily - 7 x weekly - 2 sets - 10 reps - 2 hold Supine Bridge - 2 x daily - 7 x weekly - 2 sets - 10 reps  STM: L paraspinals and QL    PATIENT EDUCATION:  Education details: MOI, diagnosis, prognosis, anatomy, exercise progression, DOMS expectations, muscle firing,  envelope of function, HEP, POC  Person educated: Patient Education method: Explanation, Demonstration, Tactile cues, Verbal cues, and Handouts Education comprehension: verbalized understanding, returned demonstration, verbal cues required, and tactile cues required   HOME EXERCISE PROGRAM: Access Code: [email or web address] URL: [email or web address] Date: 06/01/2021 Prepared by: Daleen Bo    ASSESSMENT:  CLINICAL IMPRESSION: Patient is a 80 y.o. female who was seen today for physical therapy evaluation and treatment for cc of LBP. Pt's s/s appear consistent with lumbar paraspinal strain as well as postural endurance/strength deficits. Pt with good knee strength but has decreased lumbopelvic strength with clinical testing. Does not appear to have internal derangement. Pt responded very well to manual therapy during initial session. Objective impairments include decreased activity tolerance, decreased endurance, decreased mobility, decreased ROM, decreased strength, hypomobility, increased muscle spasms, impaired flexibility, improper body mechanics,  postural dysfunction, and pain. These impairments are limiting patient from cleaning, community activity, laundry, yard work, shopping, and exercise . Personal factors including Age, Time since onset of injury/illness/exacerbation, and 1-2 comorbidities:    are also affecting patient's functional outcome. Patient will benefit from skilled PT to address above impairments and improve overall function.  REHAB POTENTIAL: Good  CLINICAL DECISION MAKING: Stable/uncomplicated  EVALUATION COMPLEXITY: Low   GOALS:   SHORT TERM GOALS:  STG Name Target Date Goal status  1 Pt will become independent with HEP in order to demonstrate synthesis of PT education.  Baseline:  06/15/2021 INITIAL  2 Pt will score at least 5 pt increase on FOTO to demonstrate functional improvement in MCII and pt perceived function.   Baseline:  07/13/2021 INITIAL  3 Pt will be able to demonstrate/report ability to sit/stand/sleep for extended periods of time without pain in order to demonstrate functional improvement and tolerance to static positioning.   07/13/2021 INITIAL  4 Pt will be able to demonstrate normal L/S ROM without pain in order to demonstrate functional improvement in lumbopelvic function for self-care and house hold duties.   07/13/2021 INITIAL   LONG TERM GOALS:   LTG Name Target Date Goal status  1 Pt  will become independent with final HEP in order to demonstrate synthesis of PT education.  07/13/2021 INITIAL  2 Pt will be able to perform 5XSTS in under 12s  in order to demonstrate functional improvement above the cut off score for adults.   08/24/2021 INITIAL  3 Pt will be able to lift/squat/hold >  25 lbs in order to demonstrate functional improvement in lumbopelvic strength for return to PLOF and exercise.   08/24/2021 INITIAL  4 Pt will score >/= 78 on FOTO to demonstrate functional improvement in LBP.  08/24/2021 INITIAL   PLAN: PT FREQUENCY: 1-2x/week  PT DURATION: 12 weeks (likely D/C by  6)  PLANNED INTERVENTIONS: Therapeutic exercises, Therapeutic activity, Neuro Muscular re-education, Balance training, Gait training, Patient/Family education, Joint mobilization, Stair training, Aquatic Therapy, Dry Needling, Electrical stimulation, Spinal mobilization, Cryotherapy, Moist heat, scar mobilization, Taping, Vasopneumatic device, Traction, Ultrasound, Ionotophoresis 4mg /ml Dexamethasone, and Manual therapy  PLAN FOR NEXT SESSION: review HEP, STM, joint mobs, QL stretching, standing hip/wall extension  Daleen Bo PT, DPT 06/01/21 2:01 PM

## 2021-06-07 ENCOUNTER — Other Ambulatory Visit: Payer: Self-pay

## 2021-06-07 ENCOUNTER — Ambulatory Visit (HOSPITAL_BASED_OUTPATIENT_CLINIC_OR_DEPARTMENT_OTHER): Payer: Medicare Other | Admitting: Physical Therapy

## 2021-06-07 ENCOUNTER — Encounter (HOSPITAL_BASED_OUTPATIENT_CLINIC_OR_DEPARTMENT_OTHER): Payer: Self-pay | Admitting: Physical Therapy

## 2021-06-07 DIAGNOSIS — M545 Low back pain, unspecified: Secondary | ICD-10-CM

## 2021-06-07 DIAGNOSIS — M6281 Muscle weakness (generalized): Secondary | ICD-10-CM

## 2021-06-07 NOTE — Therapy (Signed)
OUTPATIENT PHYSICAL THERAPY TREATMENT NOTE   Patient Name: Monica Williamson MRN: [id number] DOB:Oct 23, 1940, 80 y.o., female Today's Date: 06/07/2021  PCP: Ginger Organ., MD REFERRING PROVIDER: Ginger Organ., MD   PT End of Session - 06/07/21 0933     Visit Number 2    Number of Visits 12    Date for PT Re-Evaluation 08/30/21    Authorization Type [employer] Medicare    PT Start Time 0932    PT Stop Time 1010    PT Time Calculation (min) 38 min    Activity Tolerance Patient tolerated treatment well    Behavior During Therapy WFL for tasks assessed/performed             Past Medical History:  Diagnosis Date   Anxiety    Arthritis    Atrial fibrillation ([place])    when takes Warfarin   Atrial fibrillation, currently in sinus rhythm    Bronchiectasis ([place])    contolled with lovent and allergy med   Childhood asthma    Cholelithiasis    Colon polyps    Complication of anesthesia    Depression    Diverticulosis    Dysrhythmia    sees Dr Acie Fredrickson annually for h/o Afib   H/O bronchiectasis    History of airborne allergies    uses inhalers   Hypertension    Nocturia    Osteopenia    PONV (postoperative nausea and vomiting)    severe n/v post anesthesia    Pulmonary nodule    Sleep related teeth grinding    wears a mouth guard at night   Stress fracture of tibia 2014   Vitamin D deficiency    Past Surgical History:  Procedure Laterality Date   BREAST CYST EXCISION Right    over 20 years ago   [place] N/A 06/27/2019   Procedure: CARDIOVERSION;  Surgeon: Thayer Headings, MD;  Location: [facility];  Service: Cardiovascular;  Laterality: N/A;   CHOLECYSTECTOMY     11/2010   INCISION AND DRAINAGE BREAST ABSCESS     JOINT REPLACEMENT Right 09/2013   knee   MENISCUS REPAIR Right 2014   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   Procedure: RIGHT TOTAL KNEE ARTHROPLASTY;  Surgeon: Vickey Huger, MD;  Location: [facility];  Service:  Orthopedics;  Laterality: Right;   TOTAL KNEE ARTHROPLASTY Left 02/24/2014   dr Ronnie Derby   TOTAL KNEE ARTHROPLASTY Left 02/24/2014   Procedure: TOTAL KNEE ARTHROPLASTY;  Surgeon: Vickey Huger, MD;  Location: [facility];  Service: Orthopedics;  Laterality: Left;   VAGINAL HYSTERECTOMY     VESICOVAGINAL FISTULA CLOSURE W/ TAH     Patient Active Problem List   Diagnosis Date Noted   Persistent atrial fibrillation ([place])    Carotid artery disease ([place]) 08/15/2016   Post-nasal drip 03/30/2016   History of acute epiglottitis 03/30/2016   Stridor 02/28/2016   Sepsis ([place]) 02/28/2016   Epiglottitis 02/28/2016   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   Long term (current) use of anticoagulants 10/01/2012   PAF (paroxysmal atrial fibrillation) ([place]) 09/25/2012   Chest pain 10/26/2010   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   BRONCHIECTASIS 08/13/2007   ALLERGIC RHINITIS 05/12/2007   Extrinsic asthma 05/12/2007   ABSCESS, BREAST 05/12/2007   Chronic cough 05/12/2007   HYSTERECTOMY, VAGINAL, HX OF 05/12/2007    REFERRING DIAG:  M54.50 (ICD-10-CM) - Low back pain, unspecified  THERAPY DIAG:  Pain, lumbar region  Muscle weakness (generalized)  PERTINENT HISTORY: Bilat TKA, osteoporosis,     SUBJECTIVE: She states that things are "okay." She states the back pain might be slightly worse. She states she had trouble sleeping last night. She is more aware of the back.   PAIN:  Are you having pain? Yes VAS scale: 5/10  Pain location: L/S and lower T/S Pain orientation: Bilateral  PAIN TYPE: dull Pain description: constant  Aggravating factors: standing for too long, lifting, transfers  Relieving factors: Tylenol, chair   PLOF: Independent   Hobbies: pickleball, working out, golfing    PATIENT GOALS : Pt states biggest goal is to lessen pain.    OBJECTIVE:   TODAY'S TREATMENT:  TODAY'S TREATMENT   Joint mob:  L1-5 CPA and L UPA grade III  Exercises Supine Lower Trunk Rotation - 2 x daily  - 7 x weekly - 2 sets - 10 reps - 3 hold Supine Posterior Pelvic Tilt - 2 x daily - 7 x weekly - 2 sets - 10 reps - 2 hold Supine Bridge - 2 x daily - 7 x weekly - 2 sets - 10 reps   Quadruped cat cow- pelvis leading 2s 10x T/S open 5s 10x each      PATIENT EDUCATION:  Education details: anatomy, exercise progression, DOMS expectations, muscle firing,  envelope of function, HEP, POC   Person educated: Patient Education method: Explanation, Demonstration, Tactile cues, Verbal cues, and Handouts Education comprehension: verbalized understanding, returned demonstration, verbal cues required, and tactile cues required     HOME EXERCISE PROGRAM: Access Code: [email or web address] URL: [email or web address] Date: 06/07/2021 Prepared by: Daleen Bo  Exercises Supine Lower Trunk Rotation - 2 x daily - 7 x weekly - 2 sets - 10 reps - 3 hold Supine Posterior Pelvic Tilt - 2 x daily - 7 x weekly - 2 sets - 10 reps - 2 hold Supine Bridge - 2 x daily - 7 x weekly - 2 sets - 10 reps Standing Lumbar Extension at Wall - Forearms - 2 x daily - 7 x weekly - 1 sets - 10 reps        ASSESSMENT:   CLINICAL IMPRESSION: Pt responded very well to mobility based exercises today and manual joint mobilizations. Pt with global thoracolumbar stiffness at today's session. Pt had abolishment of pain by end of treatment. Pt HEP updated to improve lower T/S and L/S mobility. Pt doing well with therapy. Plan to continue with mobilizations and plan to introduce paloff press at next session.   Objective impairments include decreased activity tolerance, decreased endurance, decreased mobility, decreased ROM, decreased strength, hypomobility, increased muscle spasms, impaired flexibility, improper body mechanics, postural dysfunction, and pain. These impairments are limiting patient from cleaning, community activity, laundry, yard work, shopping, and exercise . Personal factors including Age, Time since onset of  injury/illness/exacerbation, and 1-2 comorbidities:    are also affecting patient's functional outcome. Patient will benefit from skilled PT to address above impairments and improve overall function.   REHAB POTENTIAL: Good   CLINICAL DECISION MAKING: Stable/uncomplicated   EVALUATION COMPLEXITY: Low     GOALS:     SHORT TERM GOALS:   STG Name Target Date Goal status  1 Pt will become independent with HEP in order to demonstrate synthesis of PT education.   Baseline:  06/15/2021 INITIAL  2 Pt will score at least 5 pt increase on FOTO to demonstrate functional improvement in MCII and pt perceived function.    Baseline:  07/13/2021 INITIAL  3 Pt will be  able to demonstrate/report ability to sit/stand/sleep for extended periods of time without pain in order to demonstrate functional improvement and tolerance to static positioning.    07/13/2021 INITIAL  4 Pt will be able to demonstrate normal L/S ROM without pain in order to demonstrate functional improvement in lumbopelvic function for self-care and house hold duties.    07/13/2021 INITIAL    LONG TERM GOALS:    LTG Name Target Date Goal status  1 Pt  will become independent with final HEP in order to demonstrate synthesis of PT education.   07/13/2021 INITIAL  2 Pt will be able to perform 5XSTS in under 12s  in order to demonstrate functional improvement above the cut off score for adults.    08/24/2021 INITIAL  3 Pt will be able to lift/squat/hold >25 lbs in order to demonstrate functional improvement in lumbopelvic strength for return to PLOF and exercise.    08/24/2021 INITIAL  4 Pt will score >/= 78 on FOTO to demonstrate functional improvement in LBP.   08/24/2021 INITIAL    PLAN: PT FREQUENCY: 1-2x/week   PT DURATION: 12 weeks (likely D/C by 6)   PLANNED INTERVENTIONS: Therapeutic exercises, Therapeutic activity, Neuro Muscular re-education, Balance training, Gait training, Patient/Family education, Joint mobilization, Stair  training, Aquatic Therapy, Dry Needling, Electrical stimulation, Spinal mobilization, Cryotherapy, Moist heat, scar mobilization, Taping, Vasopneumatic device, Traction, Ultrasound, Ionotophoresis 4mg /ml Dexamethasone, and Manual therapy   PLAN FOR NEXT SESSION: review HEP, STM, joint mobs, QL stretching, paloff   Daleen Bo PT, DPT 06/07/21 10:13 AM

## 2021-06-22 ENCOUNTER — Encounter (HOSPITAL_BASED_OUTPATIENT_CLINIC_OR_DEPARTMENT_OTHER): Payer: Self-pay | Admitting: Physical Therapy

## 2021-06-22 ENCOUNTER — Ambulatory Visit (HOSPITAL_BASED_OUTPATIENT_CLINIC_OR_DEPARTMENT_OTHER): Payer: Medicare Other | Attending: Internal Medicine | Admitting: Physical Therapy

## 2021-06-22 ENCOUNTER — Other Ambulatory Visit: Payer: Self-pay

## 2021-06-22 DIAGNOSIS — M545 Low back pain, unspecified: Secondary | ICD-10-CM | POA: Insufficient documentation

## 2021-06-22 DIAGNOSIS — M6281 Muscle weakness (generalized): Secondary | ICD-10-CM | POA: Insufficient documentation

## 2021-06-22 NOTE — Therapy (Signed)
OUTPATIENT PHYSICAL THERAPY TREATMENT NOTE   Patient Name: Monica Williamson MRN: [id number] DOB:Jun 13, 1941, 80 y.o., female Today's Date: 06/22/2021  PCP: Ginger Organ., MD REFERRING PROVIDER: Ginger Organ., MD   PT End of Session - 06/22/21 1242     Visit Number 3    Number of Visits 12    Date for PT Re-Evaluation 08/30/21    Authorization Type [employer] Medicare    PT Start Time 1105    PT Stop Time 1145    PT Time Calculation (min) 40 min    Activity Tolerance Patient tolerated treatment well    Behavior During Therapy WFL for tasks assessed/performed              Past Medical History:  Diagnosis Date   Anxiety    Arthritis    Atrial fibrillation ([place])    when takes Warfarin   Atrial fibrillation, currently in sinus rhythm    Bronchiectasis ([facility])    contolled with lovent and allergy med   Childhood asthma    Cholelithiasis    Colon polyps    Complication of anesthesia    Depression    Diverticulosis    Dysrhythmia    sees Dr Acie Fredrickson annually for h/o Afib   H/O bronchiectasis    History of airborne allergies    uses inhalers   Hypertension    Nocturia    Osteopenia    PONV (postoperative nausea and vomiting)    severe n/v post anesthesia    Pulmonary nodule    Sleep related teeth grinding    wears a mouth guard at night   Stress fracture of tibia 2014   Vitamin D deficiency    Past Surgical History:  Procedure Laterality Date   BREAST CYST EXCISION Right    over 20 years ago   [place] N/A 06/27/2019   Procedure: CARDIOVERSION;  Surgeon: Thayer Headings, MD;  Location: [facility];  Service: Cardiovascular;  Laterality: N/A;   CHOLECYSTECTOMY     11/2010   INCISION AND DRAINAGE BREAST ABSCESS     JOINT REPLACEMENT Right 09/2013   knee   MENISCUS REPAIR Right 2014   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   Procedure: RIGHT TOTAL KNEE ARTHROPLASTY;  Surgeon: Vickey Huger, MD;  Location: [facility];  Service:  Orthopedics;  Laterality: Right;   TOTAL KNEE ARTHROPLASTY Left 02/24/2014   dr Ronnie Derby   TOTAL KNEE ARTHROPLASTY Left 02/24/2014   Procedure: TOTAL KNEE ARTHROPLASTY;  Surgeon: Vickey Huger, MD;  Location: [facility];  Service: Orthopedics;  Laterality: Left;   VAGINAL HYSTERECTOMY     VESICOVAGINAL FISTULA CLOSURE W/ TAH     Patient Active Problem List   Diagnosis Date Noted   Persistent atrial fibrillation ([place])    Carotid artery disease ([place]) 08/15/2016   Post-nasal drip 03/30/2016   History of acute epiglottitis 03/30/2016   Stridor 02/28/2016   Sepsis ([place]) 02/28/2016   Epiglottitis 02/28/2016   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   Long term (current) use of anticoagulants 10/01/2012   PAF (paroxysmal atrial fibrillation) ([place]) 09/25/2012   Chest pain 10/26/2010   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   BRONCHIECTASIS 08/13/2007   ALLERGIC RHINITIS 05/12/2007   Extrinsic asthma 05/12/2007   ABSCESS, BREAST 05/12/2007   Chronic cough 05/12/2007   HYSTERECTOMY, VAGINAL, HX OF 05/12/2007    REFERRING DIAG:  M54.50 (ICD-10-CM) - Low back pain, unspecified  THERAPY DIAG:  Pain, lumbar region  Muscle weakness (generalized)  PERTINENT HISTORY: Bilat TKA, osteoporosis,     SUBJECTIVE: She states some mornings the back is very stiff in the morning on some mornings. She states that the LTR is very stiff to start and it will "release" as she goes along. Overall, she states the back is getting "stronger." Pt states the back will spasm a few times while trimming the Christmas tree.   PAIN:  Are you having pain? No VAS scale: 0/10  Pain location: L/S and lower T/S Pain orientation: Bilateral  PAIN TYPE: dull Pain description: constant  Aggravating factors: standing for too long, lifting, transfers  Relieving factors: Tylenol, chair   PLOF: Independent   Hobbies: pickleball, working out, golfing    PATIENT GOALS : Pt states biggest goal is to lessen pain.    OBJECTIVE:    TODAY'S TREATMENT:  TODAY'S TREATMENT   Joint mob:  T8-L5 CPA and L UPA grade III  Exercises Seated T/S extension 5s 15x (improved pain and stiffness)  standing lat stretch- no change in pain Kneeling lat stretch- no change in pain Seated T/S rotation  T/S open 5s 10x each      PATIENT EDUCATION:  Education details: anatomy, exercise progression, sleeping position, gym safety,  envelope of function, HEP, POC   Person educated: Patient Education method: Explanation, Demonstration, Tactile cues, Verbal cues, and Handouts Education comprehension: verbalized understanding, returned demonstration, verbal cues required, and tactile cues required     HOME EXERCISE PROGRAM: Access Code: [email or web address] URL: [email or web address] Date: 06/07/2021 Prepared by: Daleen Bo  Exercises Supine Lower Trunk Rotation - 2 x daily - 7 x weekly - 2 sets - 10 reps - 3 hold Supine Posterior Pelvic Tilt - 2 x daily - 7 x weekly - 2 sets - 10 reps - 2 hold Supine Bridge - 2 x daily - 7 x weekly - 2 sets - 10 reps Standing Lumbar Extension at Wall - Forearms - 2 x daily - 7 x weekly - 1 sets - 10 reps        ASSESSMENT:   CLINICAL IMPRESSION: Pt with significant improvement in ROM and report of stiffness today following session. Pt with good response to T/S mobility based exercise and HEP updated accordingly. Pt does report lateral flexion position during R side sleeping which could be causing L stiffness. Pt given edu about sleeping position options as well as promotion of more extension based movement variability given history of chronic kyphotic posture. Plan to continue with thoracic mobility at next session and introduce rotational stability as able.   Objective impairments include decreased activity tolerance, decreased endurance, decreased mobility, decreased ROM, decreased strength, hypomobility, increased muscle spasms, impaired flexibility, improper body mechanics, postural  dysfunction, and pain. These impairments are limiting patient from cleaning, community activity, laundry, yard work, shopping, and exercise . Personal factors including Age, Time since onset of injury/illness/exacerbation, and 1-2 comorbidities:    are also affecting patient's functional outcome. Patient will benefit from skilled PT to address above impairments and improve overall function.   REHAB POTENTIAL: Good   CLINICAL DECISION MAKING: Stable/uncomplicated   EVALUATION COMPLEXITY: Low     GOALS:     SHORT TERM GOALS:   STG Name Target Date Goal status  1 Pt will become independent with HEP in order to demonstrate synthesis of PT education.   Baseline:  06/15/2021 INITIAL  2 Pt will score at least 5 pt increase on FOTO to demonstrate functional improvement in MCII and pt perceived function.    Baseline:  07/13/2021 INITIAL  3 Pt will be able to demonstrate/report ability to sit/stand/sleep for extended periods of time without pain in order to demonstrate functional improvement and tolerance to static positioning.    07/13/2021 INITIAL  4 Pt will be able to demonstrate normal L/S ROM without pain in order to demonstrate functional improvement in lumbopelvic function for self-care and house hold duties.    07/13/2021 INITIAL    LONG TERM GOALS:    LTG Name Target Date Goal status  1 Pt  will become independent with final HEP in order to demonstrate synthesis of PT education.   07/13/2021 INITIAL  2 Pt will be able to perform 5XSTS in under 12s  in order to demonstrate functional improvement above the cut off score for adults.    08/24/2021 INITIAL  3 Pt will be able to lift/squat/hold >25 lbs in order to demonstrate functional improvement in lumbopelvic strength for return to PLOF and exercise.    08/24/2021 INITIAL  4 Pt will score >/= 78 on FOTO to demonstrate functional improvement in LBP.   08/24/2021 INITIAL    PLAN: PT FREQUENCY: 1-2x/week   PT DURATION: 12 weeks (likely  D/C by 6)   PLANNED INTERVENTIONS: Therapeutic exercises, Therapeutic activity, Neuro Muscular re-education, Balance training, Gait training, Patient/Family education, Joint mobilization, Stair training, Aquatic Therapy, Dry Needling, Electrical stimulation, Spinal mobilization, Cryotherapy, Moist heat, scar mobilization, Taping, Vasopneumatic device, Traction, Ultrasound, Ionotophoresis 4mg /ml Dexamethasone, and Manual therapy   PLAN FOR NEXT SESSION: review HEP, STM, joint mobs, QL stretching, paloff, review T/S mobility, wall angel  Daleen Bo PT, DPT 06/22/21 12:47 PM

## 2021-06-29 ENCOUNTER — Ambulatory Visit (HOSPITAL_BASED_OUTPATIENT_CLINIC_OR_DEPARTMENT_OTHER): Payer: Medicare Other | Admitting: Physical Therapy

## 2021-06-29 ENCOUNTER — Other Ambulatory Visit: Payer: Self-pay

## 2021-06-29 ENCOUNTER — Encounter (HOSPITAL_BASED_OUTPATIENT_CLINIC_OR_DEPARTMENT_OTHER): Payer: Self-pay | Admitting: Physical Therapy

## 2021-06-29 DIAGNOSIS — M545 Low back pain, unspecified: Secondary | ICD-10-CM | POA: Diagnosis not present

## 2021-06-29 DIAGNOSIS — M6281 Muscle weakness (generalized): Secondary | ICD-10-CM

## 2021-06-29 NOTE — Therapy (Signed)
OUTPATIENT PHYSICAL THERAPY TREATMENT NOTE   Patient Name: Monica Williamson MRN: [id number] DOB:July 26, 1940, 80 y.o., female Today's Date: 06/29/2021  PCP: Ginger Organ., MD REFERRING PROVIDER: Ginger Organ., MD   PT End of Session - 06/29/21 1021     Visit Number 4    Number of Visits 12    Date for PT Re-Evaluation 08/30/21    Authorization Type U[employer] Medicare    PT Start Time 7829    PT Stop Time 1055    PT Time Calculation (min) 40 min    Activity Tolerance Patient tolerated treatment well    Behavior During Therapy WFL for tasks assessed/performed               Past Medical History:  Diagnosis Date   Anxiety    Arthritis    Atrial fibrillation ([place])    when takes Warfarin   Atrial fibrillation, currently in sinus rhythm    Bronchiectasis ([facility])    contolled with lovent and allergy med   Childhood asthma    Cholelithiasis    Colon polyps    Complication of anesthesia    Depression    Diverticulosis    Dysrhythmia    sees Dr Acie Fredrickson annually for h/o Afib   H/O bronchiectasis    History of airborne allergies    uses inhalers   Hypertension    Nocturia    Osteopenia    PONV (postoperative nausea and vomiting)    severe n/v post anesthesia    Pulmonary nodule    Sleep related teeth grinding    wears a mouth guard at night   Stress fracture of tibia 2014   Vitamin D deficiency    Past Surgical History:  Procedure Laterality Date   BREAST CYST EXCISION Right    over 20 years ago   [place] N/A 06/27/2019   Procedure: CARDIOVERSION;  Surgeon: Thayer Headings, MD;  Location: [place];  Service: Cardiovascular;  Laterality: N/A;   CHOLECYSTECTOMY     11/2010   INCISION AND DRAINAGE BREAST ABSCESS     JOINT REPLACEMENT Right 09/2013   knee   MENISCUS REPAIR Right 2014   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   Procedure: RIGHT TOTAL KNEE ARTHROPLASTY;  Surgeon: Vickey Huger, MD;  Location: [place];  Service:  Orthopedics;  Laterality: Right;   TOTAL KNEE ARTHROPLASTY Left 02/24/2014   dr Ronnie Derby   TOTAL KNEE ARTHROPLASTY Left 02/24/2014   Procedure: TOTAL KNEE ARTHROPLASTY;  Surgeon: Vickey Huger, MD;  Location: [place];  Service: Orthopedics;  Laterality: Left;   VAGINAL HYSTERECTOMY     VESICOVAGINAL FISTULA CLOSURE W/ TAH     Patient Active Problem List   Diagnosis Date Noted   Persistent atrial fibrillation ([place])    Carotid artery disease ([place]) 08/15/2016   Post-nasal drip 03/30/2016   History of acute epiglottitis 03/30/2016   Stridor 02/28/2016   Sepsis ([place]) 02/28/2016   Epiglottitis 02/28/2016   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   Long term (current) use of anticoagulants 10/01/2012   PAF (paroxysmal atrial fibrillation) ([place]) 09/25/2012   Chest pain 10/26/2010   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   BRONCHIECTASIS 08/13/2007   ALLERGIC RHINITIS 05/12/2007   Extrinsic asthma 05/12/2007   ABSCESS, BREAST 05/12/2007   Chronic cough 05/12/2007   HYSTERECTOMY, VAGINAL, HX OF 05/12/2007    REFERRING DIAG:  M54.50 (ICD-10-CM) - Low back pain, unspecified  THERAPY DIAG:  Pain, lumbar region  Muscle weakness (  generalized)  PERTINENT HISTORY: Bilat TKA, osteoporosis,     SUBJECTIVE: Pt states "it just hurts all the time." She thinks it might be be mostly arthritis. She feels the muscle are getting stronger. Pt states that chair yoga feels good and no pain with Silver Fit.   PAIN:  Are you having pain? No VAS scale: 0/10  Pain location: L/S and lower T/S Pain orientation: Bilateral  PAIN TYPE: dull Pain description: constant  Aggravating factors: standing for too long, lifting, transfers  Relieving factors: Tylenol, chair   PLOF: Independent   Hobbies: pickleball, working out, golfing    PATIENT GOALS : Pt states biggest goal is to lessen pain.    OBJECTIVE:   TODAY'S TREATMENT:  TODAY'S TREATMENT    Exercises Supine hip flexor stretch with strap 30s 3x each T/S  open 5s 10x each 10lb KB RDL 2x10  Seated Cable Rowing 25 lbs 2x10 Famer's carry 7lbs 30ft x2      PATIENT EDUCATION:  Education details: anatomy, exercise progression, sleeping position, gym safety,  envelope of function, HEP, POC   Person educated: Patient Education method: Explanation, Demonstration, Tactile cues, Verbal cues, and Handouts Education comprehension: verbalized understanding, returned demonstration, verbal cues required, and tactile cues required     HOME EXERCISE PROGRAM: Access Code: [email or web address] URL: [email or web address] Date: 06/07/2021 Prepared by: Daleen Bo  Exercises Supine Lower Trunk Rotation - 2 x daily - 7 x weekly - 2 sets - 10 reps - 3 hold Supine Posterior Pelvic Tilt - 2 x daily - 7 x weekly - 2 sets - 10 reps - 2 hold Supine Bridge - 2 x daily - 7 x weekly - 2 sets - 10 reps Standing Lumbar Extension at Wall - Forearms - 2 x daily - 7 x weekly - 1 sets - 10 reps        ASSESSMENT:   CLINICAL IMPRESSION: Pt with good tolerance to exercise at today's and able to introduce trunk stability and resisted lumbopelvic exercise without pain. Pt with report of zero pain/discomfort at end of session with improvement in stiffness. Pt given HEP with resistance exercise to perform in the gym setting 3x week with guidance of personal trainers as needed. Due to no change in baseline pain, pt agreeable to progressing strength in hope of increasing functional capacity given pain is most aggravated by recent increase in standing and activity. Pt likely to progress well with strength training and reducing exacerbations of back "aching" with improved movement mechanics and endurance. Plan to continue with adding to "core" gym program at next session.   Objective impairments include decreased activity tolerance, decreased endurance, decreased mobility, decreased ROM, decreased strength, hypomobility, increased muscle spasms, impaired flexibility, improper body  mechanics, postural dysfunction, and pain. These impairments are limiting patient from cleaning, community activity, laundry, yard work, shopping, and exercise . Personal factors including Age, Time since onset of injury/illness/exacerbation, and 1-2 comorbidities:    are also affecting patient's functional outcome. Patient will benefit from skilled PT to address above impairments and improve overall function.   REHAB POTENTIAL: Good   CLINICAL DECISION MAKING: Stable/uncomplicated   EVALUATION COMPLEXITY: Low     GOALS:     SHORT TERM GOALS:   STG Name Target Date Goal status  1 Pt will become independent with HEP in order to demonstrate synthesis of PT education.   Baseline:  06/15/2021 INITIAL  2 Pt will score at least 5 pt increase on FOTO to demonstrate functional improvement in MCII and pt  perceived function.    Baseline:  07/13/2021 INITIAL  3 Pt will be able to demonstrate/report ability to sit/stand/sleep for extended periods of time without pain in order to demonstrate functional improvement and tolerance to static positioning.    07/13/2021 INITIAL  4 Pt will be able to demonstrate normal L/S ROM without pain in order to demonstrate functional improvement in lumbopelvic function for self-care and house hold duties.    07/13/2021 INITIAL    LONG TERM GOALS:    LTG Name Target Date Goal status  1 Pt  will become independent with final HEP in order to demonstrate synthesis of PT education.   07/13/2021 INITIAL  2 Pt will be able to perform 5XSTS in under 12s  in order to demonstrate functional improvement above the cut off score for adults.    08/24/2021 INITIAL  3 Pt will be able to lift/squat/hold >25 lbs in order to demonstrate functional improvement in lumbopelvic strength for return to PLOF and exercise.    08/24/2021 INITIAL  4 Pt will score >/= 78 on FOTO to demonstrate functional improvement in LBP.   08/24/2021 INITIAL    PLAN: PT FREQUENCY: 1-2x/week   PT  DURATION: 12 weeks (likely D/C by 6)   PLANNED INTERVENTIONS: Therapeutic exercises, Therapeutic activity, Neuro Muscular re-education, Balance training, Gait training, Patient/Family education, Joint mobilization, Stair training, Aquatic Therapy, Dry Needling, Electrical stimulation, Spinal mobilization, Cryotherapy, Moist heat, scar mobilization, Taping, Vasopneumatic device, Traction, Ultrasound, Ionotophoresis 4mg /ml Dexamethasone, and Manual therapy   PLAN FOR NEXT SESSION: review HEP, sidestepping, standing hip flexor stretch, Paloff cable press  Daleen Bo PT, DPT 06/29/21 11:12 AM

## 2021-07-07 ENCOUNTER — Encounter (HOSPITAL_BASED_OUTPATIENT_CLINIC_OR_DEPARTMENT_OTHER): Payer: Medicare Other | Admitting: Physical Therapy

## 2021-08-31 ENCOUNTER — Other Ambulatory Visit: Payer: Self-pay | Admitting: Cardiovascular Disease

## 2021-08-31 DIAGNOSIS — I4819 Other persistent atrial fibrillation: Secondary | ICD-10-CM

## 2021-09-01 NOTE — Telephone Encounter (Deleted)
Prescription refill request for Eliquis received. Indication: afib  Last office visit:05/17/21 ([facility])  Scr: 0.9 (05/04/20)  Age: 81 Weight: 69.6kg  Pt labs overdue. Called PCP to determine if labs have been done within the last year. Left message for medical records.

## 2021-09-02 NOTE — Telephone Encounter (Signed)
Prescription refill request for Eliquis received. Indication: afib  Last office visit:05/17/21 ([facility])  Scr: 0.9 (04/05/21 Via PCP)  Age: 81 Weight: 69.6kg   Appropriate dose and refill sent to requested pharmacy.

## 2021-09-13 ENCOUNTER — Encounter: Payer: Self-pay | Admitting: Cardiovascular Disease

## 2021-09-13 ENCOUNTER — Telehealth: Payer: Self-pay | Admitting: Cardiovascular Disease

## 2021-09-13 NOTE — Telephone Encounter (Signed)
Pt c/o BP issue: STAT if pt c/o blurred vision, one-sided weakness or slurred speech  1. What are your last 5 BP readings?  165/85 185/101  2. Are you having any other symptoms (ex. Dizziness, headache, blurred vision, passed out)?  Elevated HR (ranging 85-101), palpitations  3. What is your BP issue?   BP/HR has been elevated over the weekend. Last night, 2/26 patient experienced pounding palpitations. She would like to know whether Metoprolol needs to be adjusted and if it is alright to take with Propanolol. Patient also sent patient message through [facility] (2/27). Please advise.

## 2021-09-13 NOTE — Progress Notes (Signed)
Cardiology Office Note   Date:  09/14/2021   ID:  Monica Williamson, Monica Williamson 1940/09/22, MRN [id number]  PCP:  Monica Organ., Monica Williamson  Cardiologist:   Mertie Moores, Monica Williamson   Chief Complaint  Patient presents with   Atrial Fibrillation        Tachycardia           Monica Williamson is a 81 y.o. female who presents for follow-up of her paroxysmal atrial fibrillation.  She has a CHADS VASC 2 score of 3 and is on Xarelto .   She has had both  knees replaced.  She has had only 2 episodes of PAF - resolved very quickly with the propranolol.   Doing well.   Jan. 27, 2017:  Jan. 29, 2018:  Monica Williamson is seen today for follow up of her PAF .  Has had 3 episodes of PAF in December.  Takes a propranolol with resolution ( 1-2 tabs )  Typically occurs at night ,  Lasts perhaps 30 minutes.   Aug. 29, 2018:  Monica Williamson is doing well Has had several episodes of paroxysmal atrial fib.  Doing well Leaving for [place] in 2 weeks.    Aug. 29, 2019 Monica Williamson is seen today for follow-up of her paroxysmal atrial fibrillation.   Monica Williamson came to her appt today .   I last saw her in August, 2018.  She saw Monica Williamson, [place] in January, 2019 for some palpitations.  She is found to be in normal sinus rhythm and was reassured.  She is been on Xarelto.  Diltiazem seems to control her atrial fibrillation fairly well.  BP has been quite variable recently - has had low diastolic BP readings. Saw Dr. Brigitte Williamson.    Was having some dizziness.   Also had ringing in her ears.   con  HCTZ was stopped.   Now on Valsartan. LDL was found to be slightly elevated - 85.   Was started on Zetia.  Scheduled to have repeat lipids drawn in November. Has had more palpitations.  Takes propranolol with resolution of her symptoms  Thinks she is having more atiral fib.  She cannot really feel any palpitations - just gets a notification when her BP says she has an irregular HR   Has moderate carotid disease.  She has been seen  by the vein and vascular specialist.  She is scheduled for repeat duplex scan in November.  Sept. 11, 2020   Monica Williamson is seen today for follow up of her PAF, hyperlipidemia   She is had right carotid endarterectomy since I last saw her. She is in atrial fib today .   Cannot feel the HR irregularity .   May 22, 2019: Monica Williamson is seen today for follow-up visit.  She has a history of paroxysmal atrial fibrillation.  She has mild hyperlipidemia.  She has moderate carotid disease and has had a right carotid endarterectomy.  Has gotten a Kardia monitor Has Afib frequently  Tried propranolol - did not seem toaffect the AF We started metoprolol ,  Is on xarelto  She held her xarelto back on       For a back injection  October 31, 2019  Monica Williamson is seen today for follow up of her afib  She has a hx of carotid stenosis and is s/p CEA. We performed a cardioversion Dec. 2020 but she went back into AF several days later  She is doing well.  Takes her eliquis BID   May 01, 2020: Monica Williamson is seen today for follow-up of her atrial fibrillation and carotid endarterectomy.  We performed a cardioversion in December, 2020 but she went back into atrial fibrillation.  She remains asymptomatic and cannot even tell that she is in A. Fib. Walks every day with her dog,  Exercises twice a week at [employer].  She is not interested in doing another cardioversion   Oct. 31, 2022: Monica Williamson is seen today for follow up of her Afib and CEA She cannot tell that she is in atrial fib  We have tried cardioversion in the past   September 14, 2021: Monica Williamson is seen today for follow-up of her paroxysmal atrial fibrillation.  She has been having some episodes of tachycardia recently. She is in afib today   5 days ago, she noticed swelling of her legs.   , increased shortness of breath   Increased her water intake  Noticed her BP was elevated.  Elevated her legs for much of the weekend, swelling had improved significantly   We douibled  her metoprolol to 50 BID and she is feeling much better.  Has not been eating any extra salt.   Works out 4-5 times a week without any difficulty  ( MWF - chair yoga,  T, Th Silver Fit - cardio     Past Medical History:  Diagnosis Date   Anxiety    Arthritis    Atrial fibrillation ([place])    when takes Warfarin   Atrial fibrillation, currently in sinus rhythm    Bronchiectasis ([facility])    contolled with lovent and allergy med   Childhood asthma    Cholelithiasis    Colon polyps    Complication of anesthesia    Depression    Diverticulosis    Dysrhythmia    sees Dr Acie Fredrickson annually for h/o Afib   H/O bronchiectasis    History of airborne allergies    uses inhalers   Hypertension    Nocturia    Osteopenia    PONV (postoperative nausea and vomiting)    severe n/v post anesthesia    Pulmonary nodule    Sleep related teeth grinding    wears a mouth guard at night   Stress fracture of tibia 2014   Vitamin D deficiency     Past Surgical History:  Procedure Laterality Date   BREAST CYST EXCISION Right    over 20 years ago   [place] N/A 06/27/2019   Procedure: CARDIOVERSION;  Surgeon: Ercia Crisafulli, Wonda Cheng, Monica Williamson;  Location: [facility];  Service: Cardiovascular;  Laterality: N/A;   CHOLECYSTECTOMY     11/2010   INCISION AND DRAINAGE BREAST ABSCESS     JOINT REPLACEMENT Right 09/2013   knee   MENISCUS REPAIR Right 2014   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   Procedure: RIGHT TOTAL KNEE ARTHROPLASTY;  Surgeon: Vickey Huger, Monica Williamson;  Location: [facility];  Service: Orthopedics;  Laterality: Right;   TOTAL KNEE ARTHROPLASTY Left 02/24/2014   dr Monica Williamson   TOTAL KNEE ARTHROPLASTY Left 02/24/2014   Procedure: TOTAL KNEE ARTHROPLASTY;  Surgeon: Vickey Huger, Monica Williamson;  Location: [facility];  Service: Orthopedics;  Laterality: Left;   VAGINAL HYSTERECTOMY     VESICOVAGINAL FISTULA CLOSURE W/ TAH       Current Outpatient Medications  Medication Sig Dispense Refill   acetaminophen  (TYLENOL) 500 MG tablet Take 1,000 mg by mouth every 6 (six) hours as needed for moderate pain or headache.  albuterol (PROVENTIL HFA;VENTOLIN HFA) 108 (90 Base) MCG/ACT inhaler Inhale 2 puffs into the lungs 2 (two) times daily as needed for wheezing or shortness of breath. 1 Inhaler 5   ALPRAZolam (XANAX) 0.5 MG tablet Take 0.25 mg by mouth at bedtime as needed for anxiety or sleep.      Ascorbic Acid (VITAMIN C PO) Take 1 tablet by mouth daily.     atorvastatin (LIPITOR) 40 MG tablet Take 40 mg by mouth daily at 6 PM.      buPROPion (WELLBUTRIN XL) 150 MG 24 hr tablet Take 150 mg by mouth every morning.  30 tablet 11   cetirizine (ZYRTEC) 10 MG tablet Take 10 mg by mouth daily.     Cholecalciferol (VITAMIN D-1000 MAX ST) 25 MCG (1000 UT) tablet Take 1,000 Units by mouth daily.     diltiazem (DILT-XR) 180 MG 24 hr capsule TAKE 1 CAPSULE BY MOUTH  DAILY 90 capsule 2   ELIQUIS 5 MG TABS tablet TAKE 1 TABLET BY MOUTH  TWICE DAILY 180 tablet 3   ezetimibe (ZETIA) 10 MG tablet Take 10 mg by mouth daily.      fluticasone (FLONASE) 50 MCG/ACT nasal spray Place 2 sprays into both nostrils daily. 16 g 5   fluticasone (FLOVENT HFA) 110 MCG/ACT inhaler Inhale 2 puffs into the lungs 2 (two) times daily as needed (for shortness of breath). 1 Inhaler 5   metoprolol tartrate (LOPRESSOR) 50 MG tablet Take 1 tablet (50 mg total) by mouth 2 (two) times daily. 180 tablet 3   propranolol (INDERAL) 10 MG tablet Take 1 tablet (10 mg total) by mouth 4 (four) times daily as needed (FOR PALPITATIONS Can take ONE every 30 minutes Up to 4 Doses). 60 tablet 11   psyllium (METAMUCIL) 58.6 % powder Take 1 packet by mouth daily.     No current facility-administered medications for this visit.    Allergies:   Warfarin and related, Amoxicillin, Augmentin [amoxicillin-pot clavulanate], and Avelox [moxifloxacin]    Social History:  The patient  reports that she quit smoking about 43 years ago. Her smoking use included  cigarettes. She has a 15.00 pack-year smoking history. She has never used smokeless tobacco. She reports that she does not use drugs.   Family History:  The patient's family history includes Heart disease in her sister.    ROS:  Please see the history of present illness.   Otherwise, review of systems are positive for none.   All other systems are reviewed and negative.    Physical Exam: Blood pressure 120/80, Williamson 74, height 5\' 6"  (1.676 m), weight 152 lb (68.9 kg), SpO2 97 %.  GEN:  Well nourished, well developed in no acute distress HEENT: Normal NECK: No JVD; No carotid bruits, s/p R CEA  LYMPHATICS: No lymphadenopathy CARDIAC: irreg.  Irreg.   Soft systolic murmur , radiating to axilla   RESPIRATORY:  Clear to auscultation without rales, wheezing or rhonchi  ABDOMEN: Soft, non-tender, non-distended MUSCULOSKELETAL:  No edema; No deformity  SKIN: Warm and dry NEUROLOGIC:  Alert and oriented x 3  EKG:    September 14, 2021: Atrial fibrillation with a heart rate of 74.  No ST or T wave changes.   Recent Labs: No results found for requested labs within last 8760 hours.    Lipid Panel No results found for: CHOL, TRIG, HDL, CHOLHDL, VLDL, LDLCALC, LDLDIRECT    Wt Readings from Last 3 Encounters:  09/14/21 152 lb (68.9 kg)  05/17/21 153 lb  6.4 oz (69.6 kg)  05/01/20 149 lb (67.6 kg)      Other studies Reviewed:      ASSESSMENT AND PLAN:  1.  Atrial fibrillation:   She started having palpitations with increased heart rate and elevated blood pressure.  We increased her metoprolol to 50 twice a day and now her heart rate and blood pressure are both normal.  She remains in atrial fibrillation.  We have attempted cardioversion but it did not hold.  Continue Eliquis.  Because of her palpitations we will check a basic metabolic profile as well as a CBC today.  2.   Hyperlipidemia:     managed by Dr. Brigitte Williamson   3. Carotid artery disease -stable.     I will see her again in  6 months   Current medicines are reviewed at length with the patient today.  The patient does not have concerns regarding medicines.  The following changes have been made:  no change  Labs/ tests ordered today include: ECG    Orders Placed This Encounter  Procedures   Basic metabolic panel   CBC   EKG 12-Lead   ECHOCARDIOGRAM COMPLETE      Disposition:    .    Signed, Mertie Moores, Monica Williamson  09/14/2021 5:35 PM    [facility] [address], [place], [place]  [address] Phone: [phone]; Fax: [phone]

## 2021-09-13 NOTE — Telephone Encounter (Signed)
Pt states her BP has returned to normal, but she would like to keep appointment scheduled with Dr. Acie Fredrickson for 09/14/2021 at 9:30 am.

## 2021-09-14 ENCOUNTER — Other Ambulatory Visit: Payer: Self-pay

## 2021-09-14 ENCOUNTER — Ambulatory Visit: Payer: Medicare Other | Admitting: Cardiovascular Disease

## 2021-09-14 ENCOUNTER — Encounter: Payer: Self-pay | Admitting: Cardiovascular Disease

## 2021-09-14 VITALS — BP 120/80 | HR 74 | Ht 66.0 in | Wt 152.0 lb

## 2021-09-14 DIAGNOSIS — M7989 Other specified soft tissue disorders: Secondary | ICD-10-CM | POA: Diagnosis not present

## 2021-09-14 DIAGNOSIS — Z7901 Long term (current) use of anticoagulants: Secondary | ICD-10-CM

## 2021-09-14 DIAGNOSIS — R0609 Other forms of dyspnea: Secondary | ICD-10-CM

## 2021-09-14 DIAGNOSIS — I4819 Other persistent atrial fibrillation: Secondary | ICD-10-CM

## 2021-09-14 DIAGNOSIS — I480 Paroxysmal atrial fibrillation: Secondary | ICD-10-CM | POA: Diagnosis not present

## 2021-09-14 LAB — BASIC METABOLIC PANEL
BUN/Creatinine Ratio: 31 — ABNORMAL HIGH (ref 12–28)
BUN: 29 mg/dL — ABNORMAL HIGH (ref 8–27)
CO2: 26 mmol/L (ref 20–29)
Calcium: 10.1 mg/dL (ref 8.7–10.3)
Chloride: 101 mmol/L (ref 96–106)
Creatinine, Ser: 0.94 mg/dL (ref 0.57–1.00)
Glucose: 84 mg/dL (ref 70–99)
Potassium: 4.8 mmol/L (ref 3.5–5.2)
Sodium: 139 mmol/L (ref 134–144)
eGFR: 61 mL/min/{1.73_m2} (ref >59)

## 2021-09-14 LAB — CBC
Hematocrit: 43.7 % (ref 34.0–46.6)
Hemoglobin: 15.6 g/dL (ref 11.1–15.9)
MCH: 34.4 pg — ABNORMAL HIGH (ref 26.6–33.0)
MCHC: 35.7 g/dL (ref 31.5–35.7)
MCV: 97 fL (ref 79–97)
Platelets: 280 10*3/uL (ref 150–450)
RBC: 4.53 x10E6/uL (ref 3.77–5.28)
RDW: 13.5 % (ref 11.7–15.4)
WBC: 9.4 10*3/uL (ref 3.4–10.8)

## 2021-09-14 MED ORDER — METOPROLOL TARTRATE 50 MG PO TABS: 50.0000 mg | ORAL_TABLET | Freq: Two times a day (BID) | ORAL | 3 refills | Status: DC

## 2021-09-14 NOTE — Patient Instructions (Signed)
Medication Instructions:  Your physician recommends that you continue on your current medications as directed. Please refer to the Current Medication list given to you today. REFILL: metoprolol tartrate 50 mg by mouth twice daily  *If you need a refill on your cardiac medications before your next appointment, please call your pharmacy*   Lab Work: TODAY: CBC, BMP If you have labs (blood work) drawn today and your tests are completely normal, you will receive your results only by: [place] (if you have MyChart) OR A paper copy in the mail If you have any lab test that is abnormal or we need to change your treatment, we will call you to review the results.   Testing/Procedures: Your physician has requested that you have an echocardiogram. Echocardiography is a painless test that uses sound waves to create images of your heart. It provides your doctor with information about the size and shape of your heart and how well your hearts chambers and valves are working. This procedure takes approximately one hour. There are no restrictions for this procedure.    Follow-Up: At [facility], you and your health needs are our priority.  As part of our continuing mission to provide you with exceptional heart care, we have created designated Provider Care Teams.  These Care Teams include your primary Cardiologist (physician) and Advanced Practice Providers (APPs -  Physician Assistants and Nurse Practitioners) who all work together to provide you with the care you need, when you need it.  We recommend signing up for the patient portal called "MyChart".  Sign up information is provided on this After Visit Summary.  MyChart is used to connect with patients for Virtual Visits (Telemedicine).  Patients are able to view lab/test results, encounter notes, upcoming appointments, etc.  Non-urgent messages can be sent to your provider as well.   To learn more about what you can do with MyChart, go to  [email or web address].    Your next appointment:   6 month(s)  The format for your next appointment:   In Person  Provider:   Mertie Moores, MD

## 2021-09-20 ENCOUNTER — Other Ambulatory Visit: Payer: Self-pay

## 2021-09-20 ENCOUNTER — Ambulatory Visit (HOSPITAL_COMMUNITY): Payer: Medicare Other | Attending: Internal Medicine

## 2021-09-20 DIAGNOSIS — R0609 Other forms of dyspnea: Secondary | ICD-10-CM | POA: Diagnosis not present

## 2021-09-20 DIAGNOSIS — M7989 Other specified soft tissue disorders: Secondary | ICD-10-CM | POA: Diagnosis not present

## 2021-09-20 LAB — ECHOCARDIOGRAM COMPLETE
Area-P 1/2: 4.01 cm2
MV M vel: 4.38 m/s
MV Peak grad: 76.7 mmHg
Radius: 0.5 cm
S' Lateral: 2.3 cm

## 2021-09-27 ENCOUNTER — Other Ambulatory Visit: Payer: Self-pay | Admitting: Internal Medicine

## 2021-09-27 DIAGNOSIS — Z1231 Encounter for screening mammogram for malignant neoplasm of breast: Secondary | ICD-10-CM

## 2021-11-01 ENCOUNTER — Ambulatory Visit
Admission: RE | Admit: 2021-11-01 | Discharge: 2021-11-01 | Disposition: A | Discharge status: Home / Self Care | Payer: Medicare Other | Source: Ambulatory Visit | Attending: Internal Medicine | Admitting: Internal Medicine

## 2021-11-01 DIAGNOSIS — Z1231 Encounter for screening mammogram for malignant neoplasm of breast: Secondary | ICD-10-CM

## 2021-11-15 ENCOUNTER — Other Ambulatory Visit: Payer: Self-pay

## 2021-11-15 DIAGNOSIS — M810 Age-related osteoporosis without current pathological fracture: Secondary | ICD-10-CM

## 2021-11-16 ENCOUNTER — Other Ambulatory Visit: Payer: Self-pay | Admitting: Neurosurgery

## 2021-11-16 DIAGNOSIS — M47816 Spondylosis without myelopathy or radiculopathy, lumbar region: Secondary | ICD-10-CM

## 2021-11-23 ENCOUNTER — Telehealth: Payer: Self-pay | Admitting: Pharmacy Technician

## 2021-11-23 NOTE — Telephone Encounter (Addendum)
Prolia BIV complete.  Auth Submission: no auth needed Payer: medicare a/b uhc Medication & CPT/J Code(s) submitted: prolia Route of submission (phone, fax, portal): phone Auth type: Buy/Bill Units/visits requested: x1 dose q87mReference number: [id number] Approval from: 12/07/21 to 07/17/22

## 2021-11-25 ENCOUNTER — Ambulatory Visit
Admission: RE | Admit: 2021-11-25 | Discharge: 2021-11-25 | Disposition: A | Discharge status: Home / Self Care | Payer: Medicare Other | Source: Ambulatory Visit | Attending: Neurosurgery | Admitting: Neurosurgery

## 2021-11-25 DIAGNOSIS — M47816 Spondylosis without myelopathy or radiculopathy, lumbar region: Secondary | ICD-10-CM

## 2021-12-01 ENCOUNTER — Other Ambulatory Visit (HOSPITAL_COMMUNITY): Payer: Self-pay | Admitting: *Deleted

## 2021-12-02 ENCOUNTER — Ambulatory Visit (HOSPITAL_COMMUNITY)
Admission: RE | Admit: 2021-12-02 | Discharge: 2021-12-02 | Disposition: A | Discharge status: Home / Self Care | Payer: Medicare Other | Source: Ambulatory Visit | Attending: Internal Medicine | Admitting: Internal Medicine

## 2021-12-02 DIAGNOSIS — M810 Age-related osteoporosis without current pathological fracture: Secondary | ICD-10-CM | POA: Diagnosis present

## 2021-12-02 MED ORDER — DENOSUMAB 60 MG/ML ~~LOC~~ SOSY
PREFILLED_SYRINGE | SUBCUTANEOUS | Status: AC
  Filled 2021-12-02: qty 1

## 2021-12-02 MED ORDER — DENOSUMAB 60 MG/ML ~~LOC~~ SOSY
60.0000 mg | PREFILLED_SYRINGE | Freq: Once | SUBCUTANEOUS | Status: AC
  Administered 2021-12-02: 60 mg via SUBCUTANEOUS

## 2021-12-07 ENCOUNTER — Encounter: Payer: Self-pay | Admitting: Pulmonary Disease

## 2021-12-07 ENCOUNTER — Telehealth: Payer: Self-pay | Admitting: Pharmacy Technician

## 2021-12-09 ENCOUNTER — Encounter (HOSPITAL_COMMUNITY): Payer: Medicare Other

## 2022-03-27 ENCOUNTER — Encounter: Payer: Self-pay | Admitting: Cardiovascular Disease

## 2022-03-27 NOTE — Progress Notes (Unsigned)
Cardiology Office Note   Date:  03/28/2022   ID:  Monica Williamson, DOB 04-01-1941, MRN [id number]  PCP:  Ginger Organ., MD  Cardiologist:   Mertie Moores, MD   Chief Complaint  Patient presents with   Atrial Fibrillation      Monica Williamson is a 81 y.o. female who presents for follow-up of her paroxysmal atrial fibrillation.  She has a CHADS VASC 2 score of 3 and is on Xarelto .   She has had both  knees replaced.  She has had only 2 episodes of PAF - resolved very quickly with the propranolol.   Doing well.   Jan. 27, 2017:  Jan. 29, 2018:  Monica Williamson is seen today for follow up of her PAF .  Has had 3 episodes of PAF in December.  Takes a propranolol with resolution ( 1-2 tabs )  Typically occurs at night ,  Lasts perhaps 30 minutes.   Aug. 29, 2018:  Monica Williamson is doing well Has had several episodes of paroxysmal atrial fib.  Doing well Leaving for [place] in 2 weeks.    Aug. 29, 2019 Monica Williamson is seen today for follow-up of her paroxysmal atrial fibrillation.   Monica Williamson came to her appt today .   I last saw her in August, 2018.  She saw [name], [place] in January, 2019 for some palpitations.  She is found to be in normal sinus rhythm and was reassured.  She is been on Xarelto.  Diltiazem seems to control her atrial fibrillation fairly well.  BP has been quite variable recently - has had low diastolic BP readings. Saw Dr. Brigitte Pulse.    Was having some dizziness.   Also had ringing in her ears.   con  HCTZ was stopped.   Now on Valsartan. LDL was found to be slightly elevated - 85.   Was started on Zetia.  Scheduled to have repeat lipids drawn in November. Has had more palpitations.  Takes propranolol with resolution of her symptoms  Thinks she is having more atiral fib.  She cannot really feel any palpitations - just gets a notification when her BP says she has an irregular HR   Has moderate carotid disease.  She has been seen by the vein and vascular  specialist.  She is scheduled for repeat duplex scan in November.  Sept. 11, 2020   Monica Williamson is seen today for follow up of her PAF, hyperlipidemia   She is had right carotid endarterectomy since I last saw her. She is in atrial fib today .   Cannot feel the HR irregularity .   May 22, 2019: Monica Williamson is seen today for follow-up visit.  She has a history of paroxysmal atrial fibrillation.  She has mild hyperlipidemia.  She has moderate carotid disease and has had a right carotid endarterectomy.  Has gotten a Kardia monitor Has Afib frequently  Tried propranolol - did not seem toaffect the AF We started metoprolol ,  Is on xarelto  She held her xarelto back on       For a back injection  October 31, 2019  Monica Williamson is seen today for follow up of her afib  She has a hx of carotid stenosis and is s/p CEA. We performed a cardioversion Dec. 2020 but she went back into AF several days later  She is doing well.  Takes her eliquis BID   May 01, 2020: Monica Williamson is seen today for follow-up of her atrial fibrillation and  carotid endarterectomy.  We performed a cardioversion in December, 2020 but she went back into atrial fibrillation.  She remains asymptomatic and cannot even tell that she is in A. Fib. Walks every day with her dog,  Exercises twice a week at [employer].  She is not interested in doing another cardioversion   Oct. 31, 2022: Monica Williamson is seen today for follow up of her Afib and CEA She cannot tell that she is in atrial fib  We have tried cardioversion in the past   September 14, 2021: Monica Williamson is seen today for follow-up of her paroxysmal atrial fibrillation.  She has been having some episodes of tachycardia recently. She is in afib today   5 days ago, she noticed swelling of her legs.   , increased shortness of breath   Increased her water intake  Noticed her BP was elevated.  Elevated her legs for much of the weekend, swelling had improved significantly   We douibled her metoprolol to 50 BID  and she is feeling much better.  Has not been eating any extra salt.   Works out 4-5 times a week without any difficulty  ( MWF - chair yoga,  T, Th [employer] - cardio  Sept. 11, 2023 Monica Williamson is seen for follow up of her PAF Doing well  Exercises regularly   Past Medical History:  Diagnosis Date   Anxiety    Arthritis    Atrial fibrillation ([place])    when takes Warfarin   Atrial fibrillation, currently in sinus rhythm    Bronchiectasis ([place])    contolled with lovent and allergy med   Childhood asthma    Cholelithiasis    Colon polyps    Complication of anesthesia    Depression    Diverticulosis    Dysrhythmia    sees Dr Acie Fredrickson annually for h/o Afib   H/O bronchiectasis    History of airborne allergies    uses inhalers   Hypertension    Nocturia    Osteopenia    PONV (postoperative nausea and vomiting)    severe n/v post anesthesia    Pulmonary nodule    Sleep related teeth grinding    wears a mouth guard at night   Stress fracture of tibia 2014   Vitamin D deficiency     Past Surgical History:  Procedure Laterality Date   BREAST CYST EXCISION Right    over 20 years ago   [place] N/A 06/27/2019   Procedure: CARDIOVERSION;  Surgeon: Kaileb Monsanto, Wonda Cheng, MD;  Location: [facility];  Service: Cardiovascular;  Laterality: N/A;   CHOLECYSTECTOMY     11/2010   INCISION AND DRAINAGE BREAST ABSCESS     JOINT REPLACEMENT Right 09/2013   knee   MENISCUS REPAIR Right 2014   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   Procedure: RIGHT TOTAL KNEE ARTHROPLASTY;  Surgeon: Vickey Huger, MD;  Location: [facility];  Service: Orthopedics;  Laterality: Right;   TOTAL KNEE ARTHROPLASTY Left 02/24/2014   dr Ronnie Derby   TOTAL KNEE ARTHROPLASTY Left 02/24/2014   Procedure: TOTAL KNEE ARTHROPLASTY;  Surgeon: Vickey Huger, MD;  Location: [facility];  Service: Orthopedics;  Laterality: Left;   VAGINAL HYSTERECTOMY     VESICOVAGINAL FISTULA CLOSURE W/ TAH       Current Outpatient  Medications  Medication Sig Dispense Refill   acetaminophen (TYLENOL) 500 MG tablet Take 1,000 mg by mouth every 6 (six) hours as needed for moderate pain or headache.  albuterol (PROVENTIL HFA;VENTOLIN HFA) 108 (90 Base) MCG/ACT inhaler Inhale 2 puffs into the lungs 2 (two) times daily as needed for wheezing or shortness of breath. 1 Inhaler 5   ALPRAZolam (XANAX) 0.5 MG tablet Take 0.25 mg by mouth at bedtime as needed for anxiety or sleep.      Ascorbic Acid (VITAMIN C PO) Take 1 tablet by mouth daily.     atorvastatin (LIPITOR) 40 MG tablet Take 40 mg by mouth daily at 6 PM.      buPROPion (WELLBUTRIN XL) 150 MG 24 hr tablet Take 150 mg by mouth every morning.  30 tablet 11   cetirizine (ZYRTEC) 10 MG tablet Take 10 mg by mouth daily.     Cholecalciferol (VITAMIN D-1000 MAX ST) 25 MCG (1000 UT) tablet Take 1,000 Units by mouth daily.     diltiazem (DILT-XR) 180 MG 24 hr capsule TAKE 1 CAPSULE BY MOUTH  DAILY 90 capsule 2   ELIQUIS 5 MG TABS tablet TAKE 1 TABLET BY MOUTH  TWICE DAILY 180 tablet 3   ezetimibe (ZETIA) 10 MG tablet Take 10 mg by mouth daily.      fluticasone (FLONASE) 50 MCG/ACT nasal spray Place 2 sprays into both nostrils daily. 16 g 5   fluticasone (FLOVENT HFA) 110 MCG/ACT inhaler Inhale 2 puffs into the lungs 2 (two) times daily as needed (for shortness of breath). 1 Inhaler 5   metoprolol tartrate (LOPRESSOR) 50 MG tablet Take 1 tablet (50 mg total) by mouth 2 (two) times daily. 180 tablet 3   propranolol (INDERAL) 10 MG tablet Take 1 tablet (10 mg total) by mouth 4 (four) times daily as needed (FOR PALPITATIONS Can take ONE every 30 minutes Up to 4 Doses). 60 tablet 11   psyllium (METAMUCIL) 58.6 % powder Take 1 packet by mouth daily.     No current facility-administered medications for this visit.    Allergies:   Warfarin and related, Amoxicillin, Augmentin [amoxicillin-pot clavulanate], and Avelox [moxifloxacin]    Social History:  The patient  reports that she  quit smoking about 43 years ago. Her smoking use included cigarettes. She has a 15.00 pack-year smoking history. She has never used smokeless tobacco. She reports that she does not use drugs.   Family History:  The patient's family history includes Heart disease in her sister.    ROS:  Please see the history of present illness.   Otherwise, review of systems are positive for none.   All other systems are reviewed and negative.    Physical Exam: Blood pressure 130/72, pulse 83, height '5\' 6"'$  (1.676 m), weight 151 lb 12.8 oz (68.9 kg), SpO2 99 %.       GEN:  Well nourished, well developed in no acute distress HEENT: Normal NECK: No JVD; No carotid bruits, R CEA scar  LYMPHATICS: No lymphadenopathy CARDIAC: irreg. Irreg.  RESPIRATORY:  Clear to auscultation without rales, wheezing or rhonchi  ABDOMEN: Soft, non-tender, non-distended MUSCULOSKELETAL:  No edema; No deformity  SKIN: Warm and dry NEUROLOGIC:  Alert and oriented x 3   EKG:       Recent Labs: 09/14/2021: BUN 29; Creatinine, Ser 0.94; Hemoglobin 15.6; Platelets 280; Potassium 4.8; Sodium 139    Lipid Panel No results found for: "CHOL", "TRIG", "HDL", "CHOLHDL", "VLDL", "LDLCALC", "LDLDIRECT"    Wt Readings from Last 3 Encounters:  03/28/22 151 lb 12.8 oz (68.9 kg)  09/14/21 152 lb (68.9 kg)  05/17/21 153 lb 6.4 oz (69.6 kg)  Other studies Reviewed:      ASSESSMENT AND PLAN:  1.  Atrial fibrillation:   has permanent Afib.   Cont eliquis .  Rate is well controlled.    2.   Hyperlipidemia:    stable    3. Carotid artery disease - stable      Current medicines are reviewed at length with the patient today.  The patient does not have concerns regarding medicines.  The following changes have been made:  no change  Labs/ tests ordered today include: ECG    No orders of the defined types were placed in this encounter.     Disposition:    .    Signed, Mertie Moores, MD  03/28/2022 5:38  PM    [address], [place], [place]  [address] Phone: [phone]; Fax: [phone]

## 2022-03-28 ENCOUNTER — Ambulatory Visit: Payer: Medicare Other | Attending: Cardiovascular Disease | Admitting: Cardiovascular Disease

## 2022-03-28 ENCOUNTER — Encounter: Payer: Self-pay | Admitting: Cardiovascular Disease

## 2022-03-28 VITALS — BP 130/72 | HR 83 | Ht 66.0 in | Wt 151.8 lb

## 2022-03-28 DIAGNOSIS — I4819 Other persistent atrial fibrillation: Secondary | ICD-10-CM

## 2022-03-28 DIAGNOSIS — I6523 Occlusion and stenosis of bilateral carotid arteries: Secondary | ICD-10-CM | POA: Diagnosis not present

## 2022-03-28 NOTE — Patient Instructions (Signed)
Medication Instructions:  Your physician recommends that you continue on your current medications as directed. Please refer to the Current Medication list given to you today.  *If you need a refill on your cardiac medications before your next appointment, please call your pharmacy*  Follow-Up: At [facility], you and your health needs are our priority.  As part of our continuing mission to provide you with exceptional heart care, we have created designated Provider Care Teams.  These Care Teams include your primary Cardiologist (physician) and Advanced Practice Providers (APPs -  Physician Assistants and Nurse Practitioners) who all work together to provide you with the care you need, when you need it.   Your next appointment:   1 year(s)  The format for your next appointment:   In Person  Provider:   Mertie Moores, MD

## 2022-04-06 ENCOUNTER — Other Ambulatory Visit (HOSPITAL_BASED_OUTPATIENT_CLINIC_OR_DEPARTMENT_OTHER): Payer: Self-pay

## 2022-04-06 MED ORDER — INFLUENZA VAC A&B SA ADJ QUAD 0.5 ML IM PRSY
PREFILLED_SYRINGE | INTRAMUSCULAR | 0 refills | Status: DC
  Filled 2022-04-06: qty 0.5, 1d supply, fill #0

## 2022-04-08 NOTE — Telephone Encounter (Signed)
error 

## 2022-05-11 ENCOUNTER — Encounter: Payer: Self-pay | Admitting: Internal Medicine

## 2022-05-11 ENCOUNTER — Ambulatory Visit: Payer: Medicare Other | Admitting: Internal Medicine

## 2022-05-11 VITALS — BP 110/76 | HR 63 | Ht 66.0 in | Wt 148.0 lb

## 2022-05-11 DIAGNOSIS — R195 Other fecal abnormalities: Secondary | ICD-10-CM | POA: Diagnosis not present

## 2022-05-11 DIAGNOSIS — Z7901 Long term (current) use of anticoagulants: Secondary | ICD-10-CM | POA: Diagnosis not present

## 2022-05-11 MED ORDER — PLENVU 140 G PO SOLR: 1.0000 | Freq: Once | ORAL | 0 refills | Status: AC

## 2022-05-11 NOTE — Patient Instructions (Signed)
_______________________________________________________  If you are age 83 or older, your body mass index should be between 23-30. Your Body mass index is 23.89 kg/m. If this is out of the aforementioned range listed, please consider follow up with your Primary Care Provider.  If you are age 47 or younger, your body mass index should be between 19-25. Your Body mass index is 23.89 kg/m. If this is out of the aformentioned range listed, please consider follow up with your Primary Care Provider.   ________________________________________________________  The [facility] providers would like to encourage you to use [facility] to communicate with providers for non-urgent requests or questions.  Due to long hold times on the telephone, sending your provider a message by [facility] may be a faster and more efficient way to get a response.  Please allow 48 business hours for a response.  Please remember that this is for non-urgent requests.  _______________________________________________________  Dennis Bast have been scheduled for a colonoscopy. Please follow written instructions given to you at your visit today.  Please pick up your prep supplies at the pharmacy within the next 1-3 days. If you use inhalers (even only as needed), please bring them with you on the day of your procedure.

## 2022-05-11 NOTE — Progress Notes (Signed)
HISTORY OF PRESENT ILLNESS:  Monica Williamson is a 81 y.o. female with past medical history as listed below including history of chronic atrial fibrillation for which she is on Eliquis therapy.  The patient is sent today by her primary care provider, Dr. Brigitte Pulse regarding Hemoccult positive stool..  The patient has been seen in this office on a number of occasions regarding increased intestinal gas and loose stools.  She was last seen in this office Dec 06, 2017.  I have not seen her since.  [facility] December 2021.  She reported negative colonoscopy in 2004 in [place].  I performed colonoscopy April 2009.  This revealed sigmoid and a sending diverticulosis but was otherwise normal.  She did do Cologuard testing at age 65 which was reportedly negative.  Patient was in her usual state of good health until approximate 1 week ago when she became ill with "an upset stomach".  There was nausea but no vomiting or diarrhea.  There was some lower abdominal cramping.  She took several doses of Pepto-Bismol.  Thereafter noticed black stools, which alarmed her.  She was evaluated by her PCP who felt the dark stools were probably secondary to Pepto-Bismol.  Her hemoglobin was obtained on May 05, 2022 and found to be normal at 15.1.  BUN 24.  Creatinine 0.9.  Comprehensive metabolic panel was unremarkable except for an AST of 47.  She was placed on pantoprazole 40 mg.  Hemoccult testing was performed and returned positive x2 two days ago.  GI referral requested.  Currently the patient states that she is feeling better.  They did cancel a trip to [place].  She is still noticing some lower abdominal cramping at times.  She is accompanied today by her husband  REVIEW OF SYSTEMS:  All non-GI ROS negative unless otherwise stated in the HPI except for arthritis, back pain, hearing problems, muscle cramps, excessive urination  Past Medical History:   Diagnosis Date   Anxiety    Arthritis    Atrial fibrillation ([place])    when takes Warfarin   Atrial fibrillation, currently in sinus rhythm    Bronchiectasis ([facility])    contolled with lovent and allergy med   Childhood asthma    Cholelithiasis    Colon polyps    Complication of anesthesia    Depression    Diverticulosis    Dysrhythmia    sees Dr Acie Fredrickson annually for h/o Afib   H/O bronchiectasis    History of airborne allergies    uses inhalers   Hypertension    Nocturia    Osteopenia    PONV (postoperative nausea and vomiting)    severe n/v post anesthesia    Pulmonary nodule    Sleep related teeth grinding    wears a mouth guard at night   Stress fracture of tibia 2014   Vitamin D deficiency     Past Surgical History:  Procedure Laterality Date   BREAST CYST EXCISION Right    over 20 years ago   [place] N/A 06/27/2019   Procedure: CARDIOVERSION;  Surgeon: Nahser, Wonda Cheng, MD;  Location[facility];  Service: Cardiovascular;  Laterality: N/A;   CHOLECYSTECTOMY     11/2010   INCISION AND DRAINAGE BREAST ABSCESS     JOINT REPLACEMENT Right 09/2013   knee   MENISCUS REPAIR Right 2014   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   Procedure: RIGHT  TOTAL KNEE ARTHROPLASTY;  Surgeon: Vickey Huger, MD;  Location: [facility];  Service: Orthopedics;  Laterality: Right;   TOTAL KNEE ARTHROPLASTY Left 02/24/2014   dr Ronnie Derby   TOTAL KNEE ARTHROPLASTY Left 02/24/2014   Procedure: TOTAL KNEE ARTHROPLASTY;  Surgeon: Vickey Huger, MD;  Location: [facility];  Service: Orthopedics;  Laterality: Left;   VAGINAL HYSTERECTOMY     VESICOVAGINAL FISTULA CLOSURE W/ TAH      Social History [name]  reports that she quit smoking about 43 years ago. Her smoking use included cigarettes. She has a 15.00 pack-year smoking history. She has never used smokeless tobacco. She reports that she does not use drugs. No history on file for alcohol use.  family history includes  Heart disease in her sister.  Allergies  Allergen Reactions   Warfarin And Related Hives and Itching   Amoxicillin Itching   Augmentin [Amoxicillin-Pot Clavulanate] Rash   Avelox [Moxifloxacin] Diarrhea       PHYSICAL EXAMINATION: Vital signs: BP 110/76   Pulse 63   Ht '5\' 6"'  (1.676 m)   Wt 148 lb (67.1 kg)   BMI 23.89 kg/m   Constitutional: generally well-appearing, no acute distress Psychiatric: alert and oriented x3, cooperative Eyes: extraocular movements intact, anicteric, conjunctiva pink Mouth: oral pharynx moist, no lesions Neck: supple no lymphadenopathy Cardiovascular: heart irregularly irregular rate and rhythm, no murmur Lungs: clear to auscultation bilaterally Abdomen: soft, nontender, nondistended, no obvious ascites, no peritoneal signs, normal bowel sounds, no organomegaly Rectal: Deferred to colonoscopy Extremities: no clubbing, cyanosis, or lower extremity edema bilaterally Skin: no lesions on visible extremities Neuro: No focal deficits.  Cranial nerves intact  ASSESSMENT:  1.  Hemoccult positive stool.  Normal hemoglobin unchanged from baseline.  On chronic Eliquis therapy 2.  Recent issues with dark stools secondary to Pepto-Bismol. 3.  Upper endoscopy 2012 was normal 4.  Colonoscopy 2009 revealed diverticulosis 5.  Multiple general medical problems including history of atrial fibrillation on Eliquis  PLAN:  1.  We discussed the clinical significance of occult positive stool.  We discussed the differential including false positive.  We discussed work-up, namely colonoscopy.  We discussed the risks and benefits of the procedure.  After discussion with her and her husband, she is interested in proceeding.  She is high risk due to her age, comorbidities, and the need to address chronic anticoagulation therapy.The nature of the procedure, as well as the risks, benefits, and alternatives were carefully and thoroughly reviewed with the patient. Ample time for  discussion and questions allowed. The patient understood, was satisfied, and agreed to proceed. 2.  Hold anticoagulation 2 days preprocedure.  Will likely resume immediately post procedure pending results. 3.  Continue once daily PPI.  This would be a good idea indefinitely given her chronic anticoagulation state, age, comorbidities.  Good data to suggest that this will help reduce the risk of upper GI bleeding long-term.  She understands. 4.  Further recommendations after the above A total time of 60 minutes was spent preparing to see the patient, reviewing the myriad of outside data, obtaining comprehensive history, performing medically appropriate physical examination, counseling and educating the patient and her husband regarding the above listed issues, ordering colonoscopy, directing anticoagulation therapy, and documenting clinical information in the health record

## 2022-05-17 ENCOUNTER — Encounter: Payer: Self-pay | Admitting: Internal Medicine

## 2022-05-17 ENCOUNTER — Ambulatory Visit (AMBULATORY_SURGERY_CENTER): Payer: Medicare Other | Admitting: Internal Medicine

## 2022-05-17 VITALS — BP 130/78 | HR 86 | Temp 96.9°F | Resp 13 | Ht 66.0 in | Wt 148.0 lb

## 2022-05-17 DIAGNOSIS — K5730 Diverticulosis of large intestine without perforation or abscess without bleeding: Secondary | ICD-10-CM | POA: Diagnosis not present

## 2022-05-17 DIAGNOSIS — R195 Other fecal abnormalities: Secondary | ICD-10-CM

## 2022-05-17 MED ORDER — SODIUM CHLORIDE 0.9 % IV SOLN
500.0000 mL | Freq: Once | INTRAVENOUS | Status: DC
Start: 2022-05-17 — End: 2022-05-17

## 2022-05-17 NOTE — Patient Instructions (Addendum)
-   Repeat colonoscopy is not recommended for surveillance.  - Resume Eliquis (apixaban) today at prior dose.  - Patient has a contact number available for emergencies. The signs and symptoms of potential delayed complications were discussed with the patient. Return to normal activities tomorrow. Written discharge instructions were provided to the patient. - Resume previous diet. - Continue present medications.  Handout and information given for diverticulosis.  YOU HAD AN ENDOSCOPIC PROCEDURE TODAY AT [facility]:   Refer to the procedure report that was given to you for any specific questions about what was found during the examination.  If the procedure report does not answer your questions, please call your gastroenterologist to clarify.  If you requested that your care partner not be given the details of your procedure findings, then the procedure report has been included in a sealed envelope for you to review at your convenience later.  YOU SHOULD EXPECT: Some feelings of bloating in the abdomen. Passage of more gas than usual.  Walking can help get rid of the air that was put into your GI tract during the procedure and reduce the bloating. If you had a lower endoscopy (such as a colonoscopy or flexible sigmoidoscopy) you may notice spotting of blood in your stool or on the toilet paper. If you underwent a bowel prep for your procedure, you may not have a normal bowel movement for a few days.  Please Note:  You might notice some irritation and congestion in your nose or some drainage.  This is from the oxygen used during your procedure.  There is no need for concern and it should clear up in a day or so.  SYMPTOMS TO REPORT IMMEDIATELY:  Following lower endoscopy (colonoscopy):  Excessive amounts of blood in the stool  Significant tenderness or worsening of abdominal pains  Swelling of the abdomen that is new, acute  Fever of 100F or higher  For urgent or emergent  issues, a gastroenterologist can be reached at any hour by calling [phone]. Do not use MyChart messaging for urgent concerns.    DIET:  We do recommend a small meal at first, but then you may proceed to your regular diet.  Drink plenty of fluids but you should avoid alcoholic beverages for 24 hours.  ACTIVITY:  You should plan to take it easy for the rest of today and you should NOT DRIVE or use heavy machinery until tomorrow (because of the sedation medicines used during the test).    FOLLOW UP: Our staff will call the number listed on your records the next business day following your procedure.  We will call around 7:15- 8:00 am to check on you and address any questions or concerns that you may have regarding the information given to you following your procedure. If we do not reach you, we will leave a message.     If any biopsies were taken you will be contacted by phone or by letter within the next 1-3 weeks.  Please call us at [phone] if you have not heard about the biopsies in 3 weeks.    SIGNATURES/CONFIDENTIALITY: You and/or your care partner have signed paperwork which will be entered into your electronic medical record.  These signatures attest to the fact that that the information above on your After Visit Summary has been reviewed and is understood.  Full responsibility of the confidentiality of this discharge information lies with you and/or your care-partner.

## 2022-05-17 NOTE — Progress Notes (Signed)
Pt's states no medical or surgical changes since previsit or office visit. 

## 2022-05-17 NOTE — Progress Notes (Signed)
Pt resting comfortably. VSS. Airway intact. SBAR complete to RN. All questions answered.   

## 2022-05-17 NOTE — Progress Notes (Signed)
HISTORY OF PRESENT ILLNESS:   Monica Williamson is a 81 y.o. female with past medical history as listed below including history of chronic atrial fibrillation for which she is on Eliquis therapy.  The patient is sent today by her primary care provider, Dr. Brigitte Pulse regarding Hemoccult positive stool..   The patient has been seen in this office on a number of occasions regarding increased intestinal gas and loose stools.  She was last seen in this office Dec 06, 2017.  I have not seen her since.  She was subsequently seen at [facility] regarding the same December 2021.  She reported negative colonoscopy in 2004 in [place].  I performed colonoscopy April 2009.  This revealed sigmoid and a sending diverticulosis but was otherwise normal.  She did do Cologuard testing at age 72 which was reportedly negative.   Patient was in her usual state of good health until approximate 1 week ago when she became ill with "an upset stomach".  There was nausea but no vomiting or diarrhea.  There was some lower abdominal cramping.  She took several doses of Pepto-Bismol.  Thereafter noticed black stools, which alarmed her.  She was evaluated by her PCP who felt the dark stools were probably secondary to Pepto-Bismol.  Her hemoglobin was obtained on May 05, 2022 and found to be normal at 15.1.  BUN 24.  Creatinine 0.9.  Comprehensive metabolic panel was unremarkable except for an AST of 47.  She was placed on pantoprazole 40 mg.  Hemoccult testing was performed and returned positive x2 two days ago.  GI referral requested.  Currently the patient states that she is feeling better.  They did cancel a trip to [place].  She is still noticing some lower abdominal cramping at times.  She is accompanied today by her husband   REVIEW OF SYSTEMS:   All non-GI ROS negative unless otherwise stated in the HPI except for arthritis, back pain, hearing problems, muscle cramps, excessive urination       Past  Medical History:  Diagnosis Date   Anxiety     Arthritis     Atrial fibrillation (No Name)      when takes Warfarin   Atrial fibrillation, currently in sinus rhythm     Bronchiectasis (HCC)      contolled with lovent and allergy med   Childhood asthma     Cholelithiasis     Colon polyps     Complication of anesthesia     Depression     Diverticulosis     Dysrhythmia      sees Dr Acie Fredrickson annually for h/o Afib   H/O bronchiectasis     History of airborne allergies      uses inhalers   Hypertension     Nocturia     Osteopenia     PONV (postoperative nausea and vomiting)      severe n/v post anesthesia    Pulmonary nodule     Sleep related teeth grinding      wears a mouth guard at night   Stress fracture of tibia 2014   Vitamin D deficiency             Past Surgical History:  Procedure Laterality Date   BREAST CYST EXCISION Right      over 20 years ago   [place] N/A 06/27/2019    Procedure: CARDIOVERSION;  Surgeon: Nahser, Wonda Cheng, MD;  Location: [facility];  Service: Cardiovascular;  Laterality: N/A;   CHOLECYSTECTOMY        11/2010   INCISION AND DRAINAGE BREAST ABSCESS       JOINT REPLACEMENT Right 09/2013    knee   MENISCUS REPAIR Right 2014   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 Her smoking use included cigarettes. She has a 15.00 pack-year smoking history. She has never used smokeless tobacco. She reports that she does not use  drugs. No history on file for alcohol use.   family history includes Heart disease in her sister.       Allergies  Allergen Reactions   Warfarin And Related Hives and Itching   Amoxicillin Itching   Augmentin [Amoxicillin-Pot Clavulanate] Rash   Avelox [Moxifloxacin] Diarrhea          PHYSICAL EXAMINATION: Vital signs: BP 110/76   Pulse 63   Ht '5\' 6"'$  (1.676 m)   Wt 148 lb (67.1 kg)   BMI 23.89 kg/m   Constitutional: generally well-appearing, no acute distress Psychiatric: alert and oriented x3, cooperative Eyes: extraocular movements intact, anicteric, conjunctiva pink Mouth: oral pharynx moist, no lesions Neck: supple no lymphadenopathy Cardiovascular: heart irregularly irregular rate and rhythm, no murmur Lungs: clear to auscultation bilaterally Abdomen: soft, nontender, nondistended, no obvious ascites, no peritoneal signs, normal bowel sounds, no organomegaly Rectal: Deferred to colonoscopy Extremities: no clubbing, cyanosis, or lower extremity edema bilaterally Skin: no lesions on visible extremities Neuro: No focal deficits.  Cranial nerves intact   ASSESSMENT:   1.  Hemoccult positive stool.  Normal hemoglobin unchanged from baseline.  On chronic Eliquis therapy 2.  Recent issues with dark stools secondary to Pepto-Bismol. 3.  Upper endoscopy 2012 was normal 4.  Colonoscopy 2009 revealed diverticulosis 5.  Multiple general medical problems including history of atrial fibrillation on Eliquis   PLAN:   1.  We discussed the clinical significance of occult positive stool.  We discussed the differential including false positive.  We discussed work-up, namely colonoscopy.  We discussed the risks and benefits of the procedure.  After discussion with her and her husband, she is interested in proceeding.  She is high risk due to her age, comorbidities, and the need to address chronic anticoagulation therapy.The nature of the procedure, as well as the risks, benefits, and  alternatives were carefully and thoroughly reviewed with the patient. Ample time for discussion and questions allowed. The patient understood, was satisfied, and agreed to proceed. 2.  Hold anticoagulation 2 days preprocedure.  Will likely resume immediately post procedure pending results. 3.  Continue once daily PPI.  This would be a good idea indefinitely given her chronic anticoagulation state, age, comorbidities.  Good data to suggest that this will help reduce the risk of upper GI bleeding long-term.  She understands. 4.  Further recommendations after the above

## 2022-05-17 NOTE — Op Note (Signed)
[facility] Patient Name: Monica Williamson Procedure Date: 05/17/2022 1:34 PM MRN: [id number] Endoscopist: Docia Chuck. Henrene Pastor , MD, [id number] Age: 81 Referring MD:  Date of Birth: 06-12-1941 Gender: Female Account #: [id number] Procedure:                Colonoscopy Indications:              Heme positive stool. Previous exam 2009 was                            negative for neoplasia Medicines:                Monitored Anesthesia Care Procedure:                Pre-Anesthesia Assessment:                           - Prior to the procedure, a History and Physical                            was performed, and patient medications and                            allergies were reviewed. The patient's tolerance of                            previous anesthesia was also reviewed. The risks                            and benefits of the procedure and the sedation                            options and risks were discussed with the patient.                            All questions were answered, and informed consent                            was obtained. Prior Anticoagulants: The patient has                            taken Eliquis (apixaban), last dose was 2 days                            prior to procedure. ASA Grade Assessment: III - A                            patient with severe systemic disease. After                            reviewing the risks and benefits, the patient was                            deemed in satisfactory condition to undergo the  procedure.                           After obtaining informed consent, the colonoscope                            was passed under direct vision. Throughout the                            procedure, the patient's blood pressure, pulse, and                            oxygen saturations were monitored continuously. The                            Olympus PCF-H190DL ([id number]) Colonoscope was                             introduced through the anus and advanced to the the                            cecum, identified by appendiceal orifice and                            ileocecal valve. The ileocecal valve, appendiceal                            orifice, and rectum were photographed. The quality                            of the bowel preparation was excellent. The                            colonoscopy was performed without difficulty. The                            patient tolerated the procedure well. The bowel                            preparation used was SUPREP via split dose                            instruction. Scope In: 1:49:11 PM Scope Out: 2:05:34 PM Scope Withdrawal Time: 0 hours 7 minutes 27 seconds  Total Procedure Duration: 0 hours 16 minutes 23 seconds  Findings:                 Multiple diverticula were found in the entire                            colon. The colon was tortuous.                           The exam was otherwise without abnormality on  direct and retroflexion views. Complications:            No immediate complications. Estimated blood loss:                            None. Estimated Blood Loss:     Estimated blood loss: none. Impression:               - Diverticulosis in the entire examined colon.                           - The examination was otherwise normal on direct                            and retroflexion views.                           - No specimens collected. Recommendation:           - Repeat colonoscopy is not recommended for                            surveillance.                           - Resume Eliquis (apixaban) today at prior dose.                           - Patient has a contact number available for                            emergencies. The signs and symptoms of potential                            delayed complications were discussed with the                            patient. Return to normal  activities tomorrow.                            Written discharge instructions were provided to the                            patient.                           - Resume previous diet.                           - Continue present medications. Docia Chuck. Henrene Pastor, MD 05/17/2022 2:19:53 PM This report has been signed electronically.

## 2022-05-18 ENCOUNTER — Telehealth: Payer: Self-pay | Admitting: *Deleted

## 2022-05-18 NOTE — Telephone Encounter (Signed)
  Follow up Call-     05/17/2022    1:04 PM  Call back number  Post procedure Call Back phone  # [phone]  Permission to leave phone message Yes     Patient questions:  Do you have a fever, pain , or abdominal swelling? No. Pain Score  0 *  Have you tolerated food without any problems? Yes.    Have you been able to return to your normal activities? Yes.    Do you have any questions about your discharge instructions: Diet   No. Medications  No. Follow up visit  No.  Do you have questions or concerns about your Care? No.  Actions: * If pain score is 4 or above: No action needed, pain <4.

## 2022-05-24 ENCOUNTER — Other Ambulatory Visit (HOSPITAL_COMMUNITY): Payer: Self-pay | Admitting: *Deleted

## 2022-05-25 ENCOUNTER — Encounter (HOSPITAL_COMMUNITY): Payer: Self-pay

## 2022-05-25 ENCOUNTER — Encounter (HOSPITAL_COMMUNITY): Payer: Medicare Other

## 2022-06-06 ENCOUNTER — Ambulatory Visit (HOSPITAL_COMMUNITY)
Admission: RE | Admit: 2022-06-06 | Discharge: 2022-06-06 | Disposition: A | Discharge status: Home / Self Care | Payer: Medicare Other | Source: Ambulatory Visit | Attending: Internal Medicine | Admitting: Internal Medicine

## 2022-06-06 DIAGNOSIS — M810 Age-related osteoporosis without current pathological fracture: Secondary | ICD-10-CM | POA: Diagnosis present

## 2022-06-06 MED ORDER — DENOSUMAB 60 MG/ML ~~LOC~~ SOSY
60.0000 mg | PREFILLED_SYRINGE | Freq: Once | SUBCUTANEOUS | Status: AC
  Administered 2022-06-06: 60 mg via SUBCUTANEOUS

## 2022-06-06 MED ORDER — DENOSUMAB 60 MG/ML ~~LOC~~ SOSY
PREFILLED_SYRINGE | SUBCUTANEOUS | Status: AC
  Filled 2022-06-06: qty 1

## 2022-09-15 ENCOUNTER — Other Ambulatory Visit: Payer: Self-pay | Admitting: Cardiovascular Disease

## 2022-09-15 DIAGNOSIS — I4819 Other persistent atrial fibrillation: Secondary | ICD-10-CM

## 2022-09-15 NOTE — Telephone Encounter (Signed)
Called PCP. Left message on voicemail requesting most recent labs. Provided fax number [phone] and call back number [phone]

## 2022-09-15 NOTE — Telephone Encounter (Signed)
Labs received via fax. Placed in pt's chart under media tab.   Scr 0.9 on 04/26/22  Appropriate dose. Refill sent.

## 2022-09-15 NOTE — Telephone Encounter (Signed)
Eliquis '5mg'$  refill request received. Patient is 82 years old, weight-67.1kg, Crea-0.94 on 09/14/21-will need updated labs, Diagnosis-Afib, and last seen by Dr. Acie Fredrickson on 03/28/22. Dose is appropriate based on dosing criteria.

## 2022-09-28 ENCOUNTER — Other Ambulatory Visit: Payer: Self-pay | Admitting: Cardiovascular Disease

## 2022-10-14 IMAGING — MR MR LUMBAR SPINE W/O CM
4 of 5 series · 25 of 48 positions shown · non-contrast
Comparison: MRI November 26, 2018.

CLINICAL DATA: Low back pain radiating to right buttock and right
leg.

EXAM:
MRI LUMBAR SPINE WITHOUT CONTRAST
TECHNIQUE: Multiplanar, multisequence MR imaging of the lumbar spine was
performed. No intravenous contrast was administered.

[Series 3: T2 · sagittal · 4.0mm · 0.53mm/px · 6 of 16 slices shown (1 of 2)]
[im 1/16]
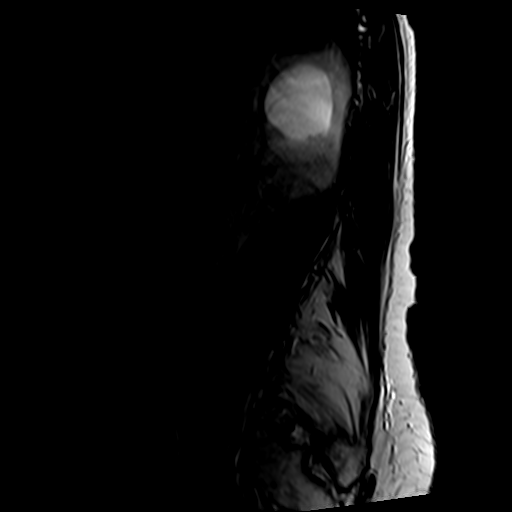
[im 4/16]
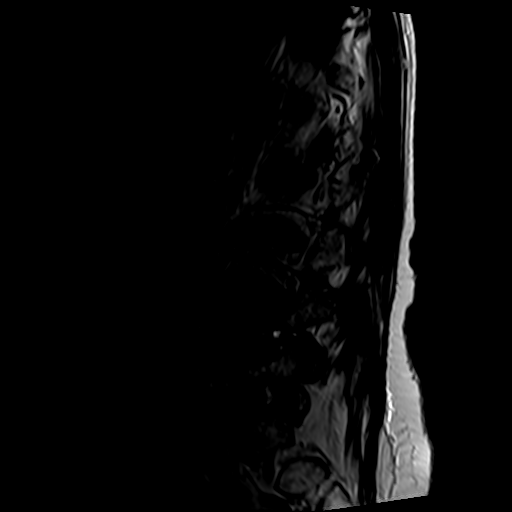
[im 7/16]
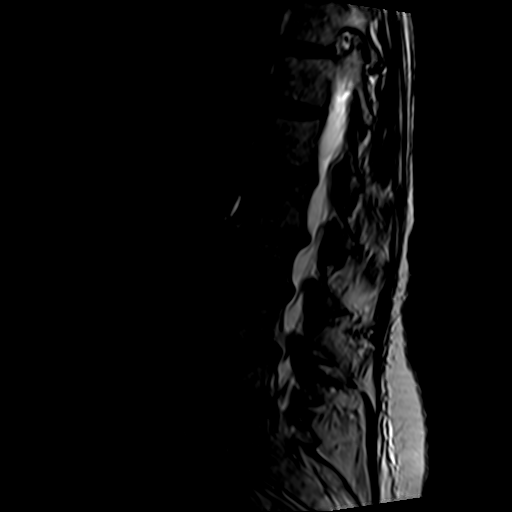
[im 10/16]
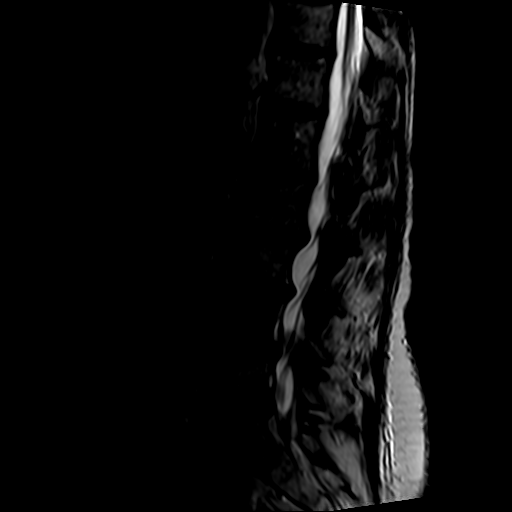
[im 13/16]
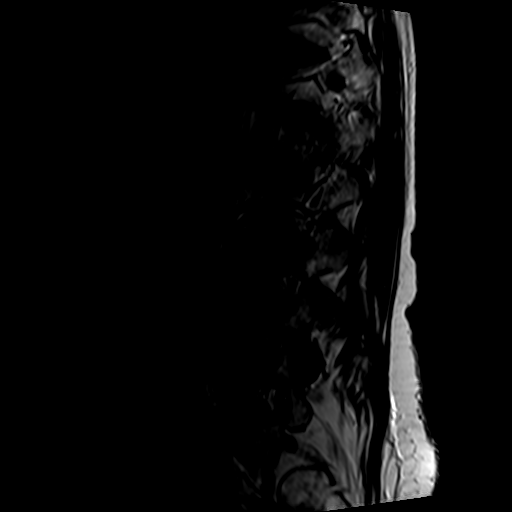
[im 16/16]
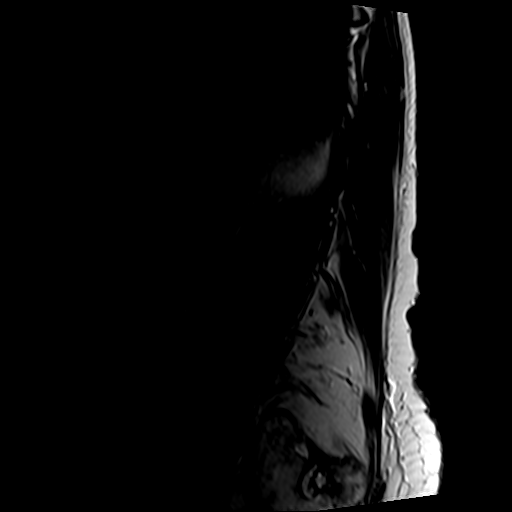

[Series 5: T1 · sagittal · 4.0mm · 0.53mm/px · 6 of 15 slices shown (1 of 2)]
[im 1/15]
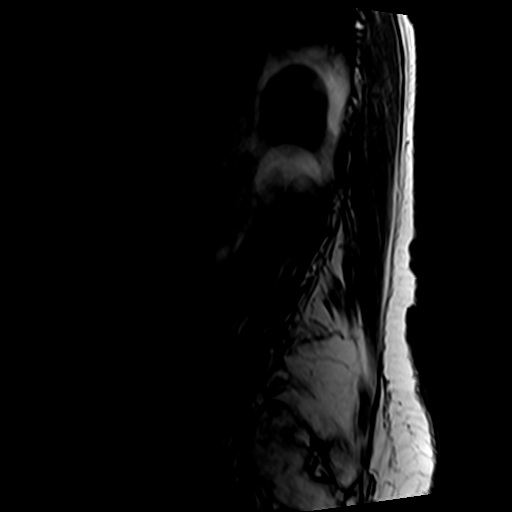
[im 3/15]
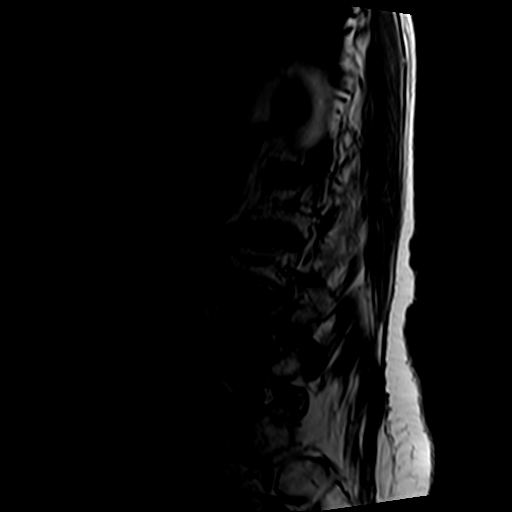
[im 6/15]
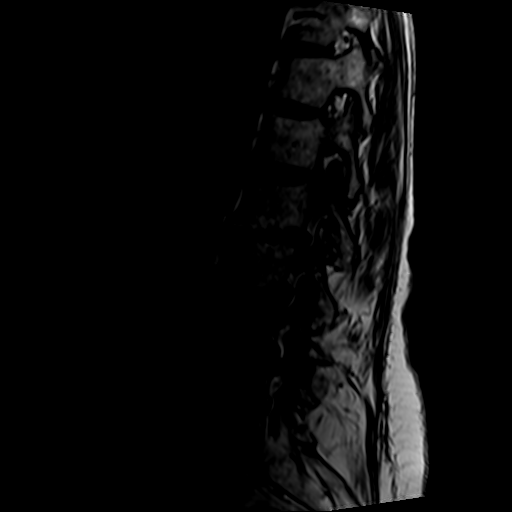
[im 9/15]
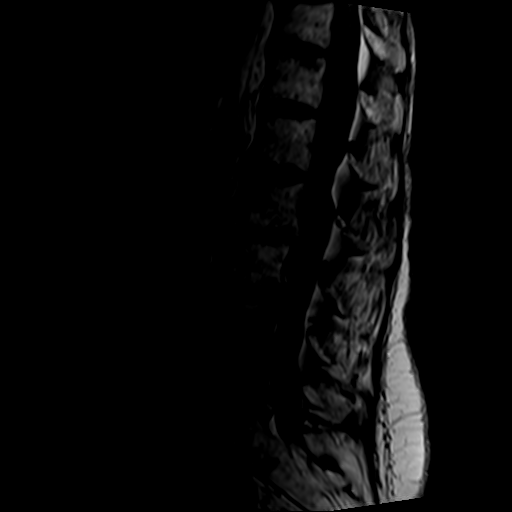
[im 12/15]
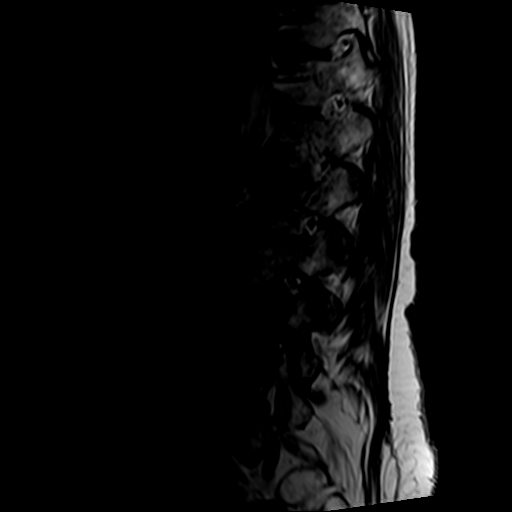
[im 15/15]
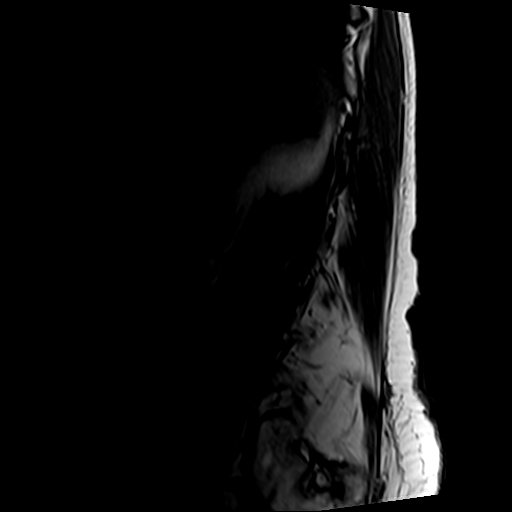

[Series 6: T2 · axial · 4.0mm · 0.70mm/px · z∈[-49,+142]mm · 9 of 36 slices shown (2 of 2)]
[im 1/36]
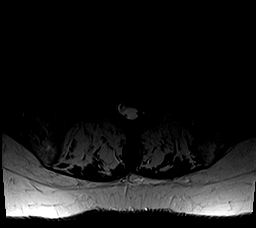
[im 6/36]
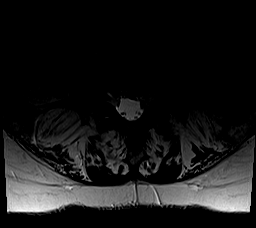
[im 11/36]
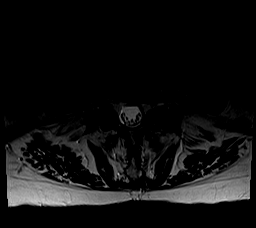
[im 16/36]
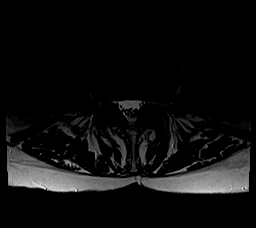
[im 18/36]
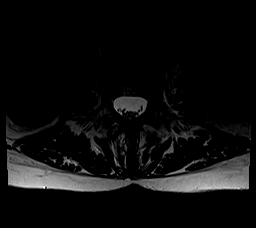
[im 21/36]
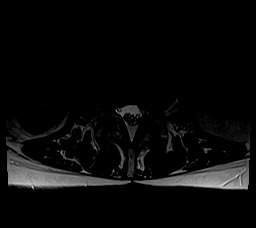
[im 26/36]
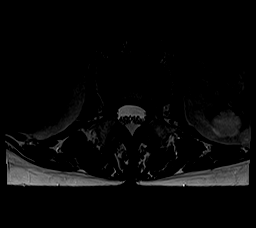
[im 31/36]
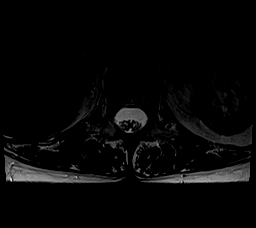
[im 36/36]
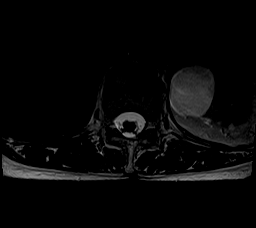

[Series 7: T1 · axial · 4.0mm · 0.35mm/px · z∈[-49,+117]mm · 4 of 36 slices shown (2 of 2)]
[im 1/36]
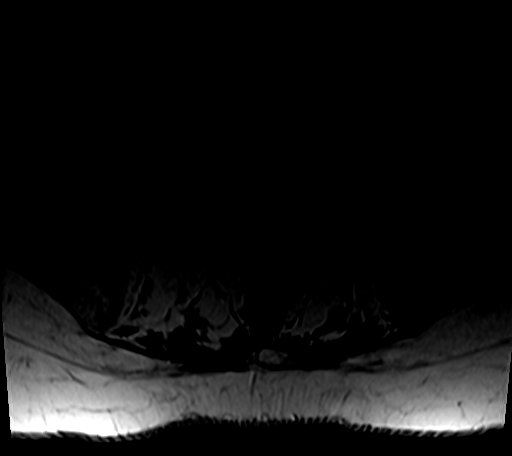
[im 6/36]
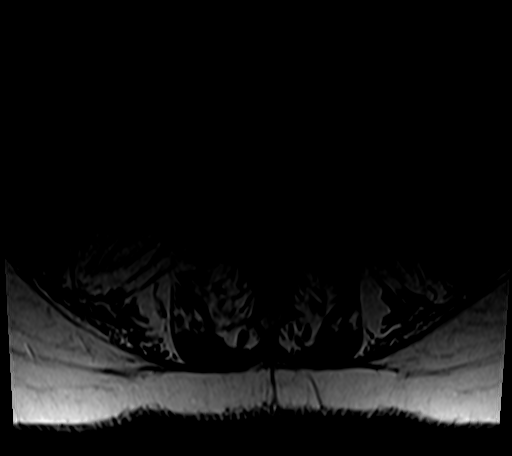
[im 18/36]
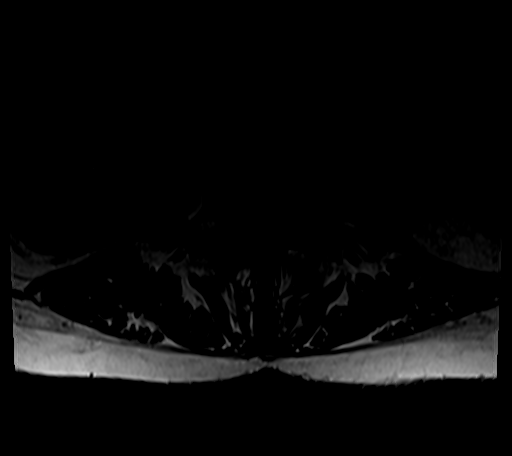
[im 31/36]
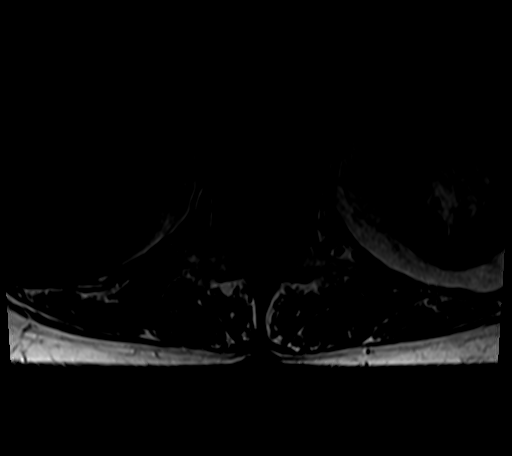

[25 of 48 positions shown; findings below may reference images not displayed]

FINDINGS: Segmentation:  Standard.  Same numbering as on the prior MRI.

Alignment:  Similar grade 1 retrolisthesis of L5 on S1.

Vertebrae: Progressive left eccentric degenerative/discogenic
endplate signal changes at L4-L5. Otherwise, no focal marrow edema
to suggest acute fracture discitis/osteomyelitis. Heterogeneous bone
marrow without suspicious bone lesion.

Conus medullaris and cauda equina: Conus extends to the L1 level.
Conus appears normal.

Paraspinal and other soft tissues: Exophytic left renal cysts,
incompletely imaged.

Disc levels:

T12-L1: No significant disc protrusion, foraminal stenosis, or canal
stenosis.

L1-L2: Mild disc bulging with mild left foraminal stenosis.
Prominent dorsal epidural fat. No significant canal stenosis. No
significant change.

L2-L3: Disc bulging with ligamentum flavum thickening and endplate
spurring. Mild bilateral foraminal stenosis. No significant change.

L3-L4: Disc bulge with endplate spurring, facet arthropathy, and
ligamentum flavum thickening. Resulting mild to moderate bilateral
foraminal stenosis without significant canal stenosis. No
significant change.

L4-L5: Left eccentric degenerative disc disease with associated
discogenic endplate marrow edema, progressed. Disc bulging, moderate
facet arthropathy, and ligamentum flavum thickening. New 8 mm
anteriorly directed synovial cyst in the left foramen (see series 4,
image 12; series 3, image 13; series 6, image 25). Resulting severe
left foraminal stenosis with possible impingement. Similar moderate
right foraminal stenosis with far lateral/extraforaminal disc
component. Patent canal.

L5-S1: Degenerative endplate spurring with inferiorly directed left
paracentral disc protrusion. Mild effacement of ventral CSF and mild
left subarticular recess stenosis. Mild left greater than right
foraminal stenosis.
IMPRESSION: 1. At L4-L5 progressive left eccentric degenerative disc disease
with new 8 mm synovial cyst in the left foramen. Resulting
progressive severe left foraminal stenosis with potential
impingement. Similar moderate right foraminal stenosis with far
lateral/extraforaminal component.
2. At L3-L4, similar mild to moderate bilateral foraminal stenosis.
3. Additional mild multilevel foraminal stenosis is detailed above.

## 2022-11-17 ENCOUNTER — Other Ambulatory Visit: Payer: Self-pay | Admitting: Cardiovascular Disease

## 2022-12-05 ENCOUNTER — Other Ambulatory Visit (HOSPITAL_COMMUNITY): Payer: Self-pay | Admitting: *Deleted

## 2022-12-06 ENCOUNTER — Ambulatory Visit (HOSPITAL_COMMUNITY)
Admission: RE | Admit: 2022-12-06 | Discharge: 2022-12-06 | Disposition: A | Discharge status: Home / Self Care | Payer: Medicare Other | Source: Ambulatory Visit | Attending: Internal Medicine | Admitting: Internal Medicine

## 2022-12-06 DIAGNOSIS — M810 Age-related osteoporosis without current pathological fracture: Secondary | ICD-10-CM | POA: Insufficient documentation

## 2022-12-06 MED ORDER — DENOSUMAB 60 MG/ML ~~LOC~~ SOSY
60.0000 mg | PREFILLED_SYRINGE | Freq: Once | SUBCUTANEOUS | Status: AC
  Administered 2022-12-06: 60 mg via SUBCUTANEOUS

## 2022-12-06 MED ORDER — DENOSUMAB 60 MG/ML ~~LOC~~ SOSY
PREFILLED_SYRINGE | SUBCUTANEOUS | Status: AC
  Filled 2022-12-06: qty 1

## 2022-12-08 ENCOUNTER — Other Ambulatory Visit: Payer: Self-pay | Admitting: Internal Medicine

## 2022-12-08 DIAGNOSIS — Z0000 Encounter for general adult medical examination without abnormal findings: Secondary | ICD-10-CM

## 2022-12-28 ENCOUNTER — Ambulatory Visit
Admission: RE | Admit: 2022-12-28 | Discharge: 2022-12-28 | Disposition: A | Discharge status: Home / Self Care | Payer: Medicare Other | Source: Ambulatory Visit | Attending: Internal Medicine | Admitting: Internal Medicine

## 2022-12-28 DIAGNOSIS — Z0000 Encounter for general adult medical examination without abnormal findings: Secondary | ICD-10-CM

## 2023-01-26 ENCOUNTER — Telehealth: Payer: Self-pay | Admitting: Cardiovascular Disease

## 2023-01-26 NOTE — Telephone Encounter (Signed)
Patient is fine to continue both medications. There is a very small risk. Diltiazem can increase concentration of Eliquis. Is she has no issues thus far, there is no reason to change therapy.

## 2023-01-26 NOTE — Telephone Encounter (Signed)
Spoke to the pt, she went to refill her Eliquis and Diltiazem she was advised the pharmacist there is contraindication taken both of these medications. Pt stated she is asymptomatic. Will forward to MD, nurse and pharm D for advise.

## 2023-01-26 NOTE — Telephone Encounter (Signed)
Pt c/o medication issue:  1. Name of Medication:  ELIQUIS 5 MG TABS tablet diltiazem (DILT-XR) 180 MG 24 hr capsule  2. How are you currently taking this medication (dosage and times per day)?   3. Are you having a reaction (difficulty breathing--STAT)?   4. What is your medication issue?   Patient states her pharmacy advised her that Diltiazem and Eliquis may cause a reaction, although patient states she has been taking them together for years.

## 2023-01-26 NOTE — Telephone Encounter (Signed)
Patient notified, she verbalized understanding and expressed appreciation for call.

## 2023-02-03 ENCOUNTER — Telehealth: Payer: Self-pay | Admitting: Cardiovascular Disease

## 2023-02-03 MED ORDER — APIXABAN 5 MG PO TABS: 5.0000 mg | ORAL_TABLET | Freq: Two times a day (BID) | ORAL | 0 refills | Status: DC

## 2023-02-03 NOTE — Telephone Encounter (Signed)
Pt c/o medication issue:  1. Name of Medication:   ELIQUIS 5 MG TABS tablet    2. How are you currently taking this medication (dosage and times per day)?   TAKE 1 TABLET BY MOUTH TWICE DAILY    3. Are you having a reaction (difficulty breathing--STAT)? No  4. What is your medication issue? Patient is [place] for a funeral and forgot to pack their medication. Patient states she has only taken her morning dose yesterday morning. Patient is requesting a call back from the nurse.

## 2023-02-03 NOTE — Telephone Encounter (Signed)
8 tabs sent to [facility] in [place] at pt's request ./cy

## 2023-03-22 ENCOUNTER — Other Ambulatory Visit: Payer: Self-pay

## 2023-03-22 MED ORDER — METOPROLOL TARTRATE 50 MG PO TABS: 50.0000 mg | ORAL_TABLET | Freq: Two times a day (BID) | ORAL | 0 refills | Status: DC

## 2023-04-06 ENCOUNTER — Other Ambulatory Visit: Payer: Self-pay | Admitting: Cardiovascular Disease

## 2023-06-20 ENCOUNTER — Other Ambulatory Visit (HOSPITAL_COMMUNITY): Payer: Self-pay | Admitting: *Deleted

## 2023-06-21 ENCOUNTER — Ambulatory Visit (HOSPITAL_COMMUNITY)
Admission: RE | Admit: 2023-06-21 | Discharge: 2023-06-21 | Disposition: A | Discharge status: Home / Self Care | Payer: Medicare Other | Source: Ambulatory Visit | Attending: Internal Medicine | Admitting: Internal Medicine

## 2023-06-21 DIAGNOSIS — Z79620 Long term (current) use of immunosuppressive biologic: Secondary | ICD-10-CM | POA: Diagnosis not present

## 2023-06-21 DIAGNOSIS — M810 Age-related osteoporosis without current pathological fracture: Secondary | ICD-10-CM | POA: Diagnosis present

## 2023-06-21 MED ORDER — DENOSUMAB 60 MG/ML ~~LOC~~ SOSY: 60.0000 mg | PREFILLED_SYRINGE | Freq: Once | SUBCUTANEOUS | Status: AC

## 2023-06-21 MED ORDER — DENOSUMAB 60 MG/ML ~~LOC~~ SOSY
PREFILLED_SYRINGE | SUBCUTANEOUS | Status: AC
  Administered 2023-06-21: 60 mg via SUBCUTANEOUS
  Filled 2023-06-21: qty 1

## 2023-06-26 ENCOUNTER — Ambulatory Visit: Payer: Medicare Other | Attending: Cardiovascular Disease | Admitting: Cardiovascular Disease

## 2023-06-26 ENCOUNTER — Encounter: Payer: Self-pay | Admitting: Cardiovascular Disease

## 2023-06-26 VITALS — BP 138/78 | HR 58 | Ht 66.0 in | Wt 151.0 lb

## 2023-06-26 DIAGNOSIS — I4819 Other persistent atrial fibrillation: Secondary | ICD-10-CM

## 2023-06-26 NOTE — Patient Instructions (Addendum)
Medication Instructions:  The current medical regimen is effective;  continue present plan and medications.  *If you need a refill on your cardiac medications before your next appointment, please call your pharmacy*   Follow-Up: At [facility], you and your health needs are our priority.  As part of our continuing mission to provide you with exceptional heart care, we have created designated Provider Care Teams.  These Care Teams include your primary Cardiologist (physician) and Advanced Practice Providers (APPs -  Physician Assistants and Nurse Practitioners) who all work together to provide you with the care you need, when you need it.  We recommend signing up for the patient portal called "MyChart".  Sign up information is provided on this After Visit Summary.  MyChart is used to connect with patients for Virtual Visits (Telemedicine).  Patients are able to view lab/test results, encounter notes, upcoming appointments, etc.  Non-urgent messages can be sent to your provider as well.   To learn more about what you can do with MyChart, go to [email or web address].    Your next appointment:   1 year(s)  Provider:   Dr Bjorn Pippin       Review your Basic metabolic profile Please send [place] the value of your creatinine  If the value is > 1.5 , we will need to reduced your dose of Eliquis

## 2023-06-26 NOTE — Progress Notes (Signed)
Cardiology Office Note   Date:  06/26/2023   ID:  [name], DOB 10-05-1940, MRN [id number]  PCP:  Monica Williamson., MD  Cardiologist:   Kristeen Miss, MD   Chief Complaint  Patient presents with   Atrial Fibrillation           Monica VANDEVANDER is a 82 y.o. female who presents for follow-up of her paroxysmal atrial fibrillation.  She has a CHADS VASC 2 score of 3 and is on Xarelto .   She has had both  knees replaced.  She has had only 2 episodes of PAF - resolved very quickly with the propranolol.   Doing well.   Jan. 27, 2017:  Jan. 29, 2018:  Monica Williamson is seen today for follow up of her PAF .  Has had 3 episodes of PAF in December.  Takes a propranolol with resolution ( 1-2 tabs )  Typically occurs at night ,  Lasts perhaps 30 minutes.   Aug. 29, 2018:  Monica Williamson is doing well Has had several episodes of paroxysmal atrial fib.  Doing well Leaving for [place] in 2 weeks.    Aug. 29, 2019 Monica Williamson is seen today for follow-up of her paroxysmal atrial fibrillation.   Monica Williamson came to her appt today .   I last saw her in August, 2018.  She saw Monica Williamson, Monica Williamson in January, 2019 for some palpitations.  She is found to be in normal sinus rhythm and was reassured.  She is been on Xarelto.  Diltiazem seems to control her atrial fibrillation fairly well.  BP has been quite variable recently - has had low diastolic BP readings. Saw Dr. Clelia Croft.    Was having some dizziness.   Also had ringing in her ears.   con  HCTZ was stopped.   Now on Valsartan. LDL was found to be slightly elevated - 85.   Was started on Zetia.  Scheduled to have repeat lipids drawn in November. Has had more palpitations.  Takes propranolol with resolution of her symptoms  Thinks she is having more atiral fib.  She cannot really feel any palpitations - just gets a notification when her BP says she has an irregular HR   Has moderate carotid disease.  She has been seen by the vein and  vascular specialist.  She is scheduled for repeat duplex scan in November.  Sept. 11, 2020   Monica Williamson is seen today for follow up of her PAF, hyperlipidemia   She is had right carotid endarterectomy since I last saw her. She is in atrial fib today .   Cannot feel the HR irregularity .   May 22, 2019: Monica Williamson is seen today for follow-up visit.  She has a history of paroxysmal atrial fibrillation.  She has mild hyperlipidemia.  She has moderate carotid disease and has had a right carotid endarterectomy.  Has gotten a Kardia monitor Has Afib frequently  Tried propranolol - did not seem toaffect the AF We started metoprolol ,  Is on xarelto  She held her xarelto back on       For a back injection  October 31, 2019  Monica Williamson is seen today for follow up of her afib  She has a hx of carotid stenosis and is s/p CEA. We performed a cardioversion Dec. 2020 but she went back into AF several days later  She is doing well.  Takes her eliquis BID   May 01, 2020: Monica Williamson is seen today for follow-up  of her atrial fibrillation and carotid endarterectomy.  We performed a cardioversion in December, 2020 but she went back into atrial fibrillation.  She remains asymptomatic and cannot even tell that she is in A. Fib. Walks every day with her dog,  Exercises twice a week at [employer].  She is not interested in doing another cardioversion   Oct. 31, 2022: Monica Williamson is seen today for follow up of her Afib and CEA She cannot tell that she is in atrial fib  We have tried cardioversion in the past   September 14, 2021: Monica Williamson is seen today for follow-up of her paroxysmal atrial fibrillation.  She has been having some episodes of tachycardia recently. She is in afib today   5 days ago, she noticed swelling of her legs.   , increased shortness of breath   Increased her water intake  Noticed her BP was elevated.  Elevated her legs for much of the weekend, swelling had improved significantly   We douibled her metoprolol  to 50 BID and she is feeling much better.  Has not been eating any extra salt.   Works out 4-5 times a week without any difficulty  ( MWF - chair yoga,  T, [employer] - cardio  Sept. 11, 2023 Monica Williamson is seen for follow up of her PAF Doing well  Exercises regularly  Dec. 9, 2024 Monica Williamson is seen for follow up of her persistent atrial fib  Took a [place] cruise this past year   Is still in atrial fib      Past Medical History:  Diagnosis Date   Anxiety    Arthritis    Atrial fibrillation (HCC)    when takes Warfarin   Atrial fibrillation, currently in sinus rhythm    Bronchiectasis (HCC)    contolled with lovent and allergy med   Childhood asthma    Cholelithiasis    Colon polyps    Complication of anesthesia    Depression    Diverticulosis    Dysrhythmia    sees Dr Monica Williamson annually for h/o Afib   H/O bronchiectasis    History of airborne allergies    uses inhalers   Hypertension    Nocturia    Osteopenia    PONV (postoperative nausea and vomiting)    severe n/v post anesthesia    Pulmonary nodule    Sleep related teeth grinding    wears a mouth guard at night   Stress fracture of tibia 2014   Vitamin D deficiency     Past Surgical History:  Procedure Laterality Date   BREAST CYST EXCISION Right    over 20 years ago   BREAST SURGERY     CARDIOVERSION N/A 06/27/2019   Procedure: CARDIOVERSION;  Surgeon: Andrey Mccaskill, Deloris Ping, MD;  Location: [facility];  Service: Cardiovascular;  Laterality: N/A;   CHOLECYSTECTOMY     11/2010   INCISION AND DRAINAGE BREAST ABSCESS     JOINT REPLACEMENT Right 09/2013   knee   MENISCUS REPAIR Right 2014   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   Procedure: RIGHT TOTAL KNEE ARTHROPLASTY;  Surgeon: Dannielle Huh, MD;  Location: [facility] OR;  Service: Orthopedics;  Laterality: Right;   TOTAL KNEE ARTHROPLASTY Left 02/24/2014   dr Sherlean Foot   TOTAL KNEE ARTHROPLASTY Left 02/24/2014   Procedure: TOTAL KNEE ARTHROPLASTY;  Surgeon: Dannielle Huh, MD;   Location: [facility] OR;  Service: Orthopedics;  Laterality: Left;   VAGINAL HYSTERECTOMY     VESICOVAGINAL FISTULA CLOSURE W/  TAH       Current Outpatient Medications  Medication Sig Dispense Refill   acetaminophen (TYLENOL) 500 MG tablet Take 1,000 mg by mouth every 6 (six) hours as needed for moderate pain or headache.     albuterol (PROVENTIL HFA;VENTOLIN HFA) 108 (90 Base) MCG/ACT inhaler Inhale 2 puffs into the lungs 2 (two) times daily as needed for wheezing or shortness of breath. 1 Inhaler 5   ALPRAZolam (XANAX) 0.5 MG tablet Take 0.25 mg by mouth at bedtime as needed for anxiety or sleep.      apixaban (ELIQUIS) 5 MG TABS tablet Take 1 tablet (5 mg total) by mouth 2 (two) times daily. 8 tablet 0   Ascorbic Acid (VITAMIN C PO) Take 1 tablet by mouth daily.     atorvastatin (LIPITOR) 40 MG tablet Take 40 mg by mouth daily at 6 PM.      Baclofen 5 MG TABS 1 tablet as needed Orally TID as needed for 30 days     buPROPion (WELLBUTRIN XL) 150 MG 24 hr tablet Take 150 mg by mouth every morning.  30 tablet 11   cetirizine (ZYRTEC) 10 MG tablet Take 10 mg by mouth daily.     Cholecalciferol (VITAMIN D-1000 MAX ST) 25 MCG (1000 UT) tablet Take 1,000 Units by mouth daily.     cyclobenzaprine (FLEXERIL) 10 MG tablet Take 10 mg by mouth daily as needed for muscle spasms.     denosumab (PROLIA) 60 MG/ML SOSY injection Inject 60 mg into the skin every 6 (six) months.     diltiazem (DILT-XR) 180 MG 24 hr capsule Take 1 capsule (180 mg total) by mouth daily. 90 capsule 2   ELIQUIS 5 MG TABS tablet TAKE 1 TABLET BY MOUTH TWICE  DAILY 180 tablet 3   ezetimibe (ZETIA) 10 MG tablet Take 10 mg by mouth daily.      fluticasone (FLONASE) 50 MCG/ACT nasal spray Place 2 sprays into both nostrils daily. (Patient taking differently: Place 2 sprays into both nostrils daily as needed for allergies.) 16 g 5   fluticasone (FLOVENT HFA) 110 MCG/ACT inhaler Inhale 2 puffs into the lungs 2 (two) times daily as needed (for  shortness of breath). 1 Inhaler 5   influenza vaccine adjuvanted (FLUAD) 0.5 ML injection Inject into the muscle. 0.5 mL 0   L-Lysine 1000 MG TABS as directed Orally     metoprolol tartrate (LOPRESSOR) 50 MG tablet Take 1 tablet (50 mg total) by mouth 2 (two) times daily. 60 tablet 0   pantoprazole (PROTONIX) 40 MG tablet Take 40 mg by mouth daily.     propranolol (INDERAL) 10 MG tablet Take 1 tablet (10 mg total) by mouth 4 (four) times daily as needed (FOR PALPITATIONS Can take ONE every 30 minutes Up to 4 Doses). 60 tablet 11   psyllium (METAMUCIL) 58.6 % powder Take 1 packet by mouth daily.     Zinc 50 MG TABS 1 tablet Orally Once a day     No current facility-administered medications for this visit.    Allergies:   Warfarin and related, Amoxicillin, Augmentin [amoxicillin-pot clavulanate], and Avelox [moxifloxacin]    Social History:  The patient  reports that she quit smoking about 44 years ago. Her smoking use included cigarettes. She started smoking about 64 years ago. She has a 15 pack-year smoking history. She has never used smokeless tobacco. She reports that she does not use drugs.   Family History:  The patient's family history includes Heart disease  in her sister.    ROS:  Please see the history of present illness.   Otherwise, review of systems are positive for none.   All other systems are reviewed and negative.    Physical Exam: Blood pressure 138/78, pulse (!) 58, height 5\' 6"  (1.676 m), weight 151 lb (68.5 kg), SpO2 98%.      GEN:  Well nourished, well developed in no acute distress HEENT: Normal NECK: No JVD; No carotid bruits. R CEA scar  LYMPHATICS: No lymphadenopathy CARDIAC: irregl irregl ., soft systolic murmur  RESPIRATORY:  Clear to auscultation without rales, wheezing or rhonchi  ABDOMEN: Soft, non-tender, non-distended MUSCULOSKELETAL:  No edema; No deformity  SKIN: Warm and dry NEUROLOGIC:  Alert and oriented x 3   EKG:   EKG  Interpretation Date/Time:  Monday June 26 2023 14:06:28 EST Ventricular Rate:  66 PR Interval:    QRS Duration:  120 QT Interval:  414 QTC Calculation: 434 R Axis:   73  Text Interpretation: Atrial fibrillation Right bundle branch block When compared with ECG of 29-Feb-2016 07:23, Atrial fibrillation has replaced Sinus rhythm Right bundle branch block has replaced Incomplete right bundle branch block Confirmed by Kristeen Miss ([id number]) on 06/26/2023 2:18:18 PM    Recent Labs: No results found for requested labs within last 365 days.    Lipid Panel No results found for: "CHOL", "TRIG", "HDL", "CHOLHDL", "VLDL", "LDLCALC", "LDLDIRECT"    Wt Readings from Last 3 Encounters:  06/26/23 151 lb (68.5 kg)  05/17/22 148 lb (67.1 kg)  05/11/22 148 lb (67.1 kg)      Other studies Reviewed:      ASSESSMENT AND PLAN:  1.  Atrial fibrillation:     Monica Williamson is doing well.  She has permanent atrial fibrillation.  Is well-tolerated.  She is currently on Eliquis 5 mg twice a day.  Will try to get some recent lab work from Dr. Clelia Croft.  She is 82 years old.  Her weight is 68 kg.  Her creatinine from last month is .86  2.   Hyperlipidemia:    stable   3. Carotid artery disease - stable   s/p R CEA    Will have her follow up with Dr Bjorn Pippin in 1 year    Current medicines are reviewed at length with the patient today.  The patient does not have concerns regarding medicines.  The following changes have been made:  no change  Labs/ tests ordered today include: ECG    Orders Placed This Encounter  Procedures   EKG 12-Lead      Disposition:    .    Signed, Kristeen Miss, MD  06/26/2023 6:09 PM    [facility] [address] [place], [place], [place]  [address] Phone: [phone]; Fax: [phone]

## 2023-08-02 ENCOUNTER — Ambulatory Visit: Payer: Medicare Other | Admitting: Cardiovascular Disease

## 2023-08-18 ENCOUNTER — Ambulatory Visit: Payer: Medicare Other | Admitting: Cardiovascular Disease

## 2023-10-05 ENCOUNTER — Other Ambulatory Visit: Payer: Self-pay | Admitting: Cardiovascular Disease

## 2023-10-05 DIAGNOSIS — I4819 Other persistent atrial fibrillation: Secondary | ICD-10-CM

## 2023-10-05 MED ORDER — DILTIAZEM HCL ER 180 MG PO CP24: 180.0000 mg | ORAL_CAPSULE | Freq: Every day | ORAL | 2 refills | Status: DC

## 2023-10-05 MED ORDER — APIXABAN 5 MG PO TABS
5.0000 mg | ORAL_TABLET | Freq: Two times a day (BID) | ORAL | 1 refills | Status: DC
Start: 2023-10-05 — End: 2024-05-06

## 2023-10-05 NOTE — Telephone Encounter (Signed)
 Prescription refill request for Eliquis received. Indication: Afib  Last office visit:06/26/23 ([facility])  Scr: 0.86 (10/24 iva [facility]) Age: 83 Weight: 68.5kg  Appropriate dose. Refill sent.

## 2023-10-05 NOTE — Telephone Encounter (Signed)
*  STAT* If patient is at the pharmacy, call can be transferred to refill team.   1. Which medications need to be refilled? (please list name of each medication and dose if known) ELIQUIS 5 MG TABS tablet diltiazem (DILT-XR) 180 MG 24 hr capsule  2. Which pharmacy/location (including street and city if local pharmacy) is medication to be sent to? [id number] - [place], [place] - [address]    3. Do they need a 30 day or 90 day supply?   90 day supply  Patient says she's almost out of medication.

## 2023-10-09 ENCOUNTER — Encounter (HOSPITAL_COMMUNITY): Payer: Self-pay | Admitting: Internal Medicine

## 2023-10-09 ENCOUNTER — Emergency Department (HOSPITAL_COMMUNITY)

## 2023-10-09 ENCOUNTER — Inpatient Hospital Stay (HOSPITAL_COMMUNITY)
Admission: EM | Admit: 2023-10-09 | Discharge: 2023-10-13 | DRG: 193 | Disposition: A | Discharge status: Home / Self Care | Attending: Internal Medicine | Admitting: Internal Medicine

## 2023-10-09 ENCOUNTER — Other Ambulatory Visit: Payer: Self-pay

## 2023-10-09 DIAGNOSIS — Z881 Allergy status to other antibiotic agents status: Secondary | ICD-10-CM

## 2023-10-09 DIAGNOSIS — F32A Depression, unspecified: Secondary | ICD-10-CM | POA: Insufficient documentation

## 2023-10-09 DIAGNOSIS — J208 Acute bronchitis due to other specified organisms: Secondary | ICD-10-CM

## 2023-10-09 DIAGNOSIS — Z90710 Acquired absence of both cervix and uterus: Secondary | ICD-10-CM

## 2023-10-09 DIAGNOSIS — J189 Pneumonia, unspecified organism: Secondary | ICD-10-CM | POA: Diagnosis not present

## 2023-10-09 DIAGNOSIS — Z96653 Presence of artificial knee joint, bilateral: Secondary | ICD-10-CM | POA: Diagnosis present

## 2023-10-09 DIAGNOSIS — I10 Essential (primary) hypertension: Secondary | ICD-10-CM | POA: Diagnosis present

## 2023-10-09 DIAGNOSIS — J45909 Unspecified asthma, uncomplicated: Secondary | ICD-10-CM | POA: Diagnosis present

## 2023-10-09 DIAGNOSIS — F419 Anxiety disorder, unspecified: Secondary | ICD-10-CM | POA: Diagnosis present

## 2023-10-09 DIAGNOSIS — Z87891 Personal history of nicotine dependence: Secondary | ICD-10-CM

## 2023-10-09 DIAGNOSIS — Z7951 Long term (current) use of inhaled steroids: Secondary | ICD-10-CM

## 2023-10-09 DIAGNOSIS — J479 Bronchiectasis, uncomplicated: Secondary | ICD-10-CM

## 2023-10-09 DIAGNOSIS — Z79899 Other long term (current) drug therapy: Secondary | ICD-10-CM

## 2023-10-09 DIAGNOSIS — B9781 Human metapneumovirus as the cause of diseases classified elsewhere: Secondary | ICD-10-CM | POA: Diagnosis present

## 2023-10-09 DIAGNOSIS — J9601 Acute respiratory failure with hypoxia: Secondary | ICD-10-CM

## 2023-10-09 DIAGNOSIS — Z7901 Long term (current) use of anticoagulants: Secondary | ICD-10-CM

## 2023-10-09 DIAGNOSIS — I480 Paroxysmal atrial fibrillation: Secondary | ICD-10-CM | POA: Diagnosis not present

## 2023-10-09 DIAGNOSIS — Z888 Allergy status to other drugs, medicaments and biological substances status: Secondary | ICD-10-CM

## 2023-10-09 DIAGNOSIS — Z880 Allergy status to penicillin: Secondary | ICD-10-CM

## 2023-10-09 DIAGNOSIS — Z8249 Family history of ischemic heart disease and other diseases of the circulatory system: Secondary | ICD-10-CM

## 2023-10-09 DIAGNOSIS — Z1152 Encounter for screening for COVID-19: Secondary | ICD-10-CM

## 2023-10-09 DIAGNOSIS — N179 Acute kidney failure, unspecified: Secondary | ICD-10-CM | POA: Diagnosis present

## 2023-10-09 DIAGNOSIS — E785 Hyperlipidemia, unspecified: Secondary | ICD-10-CM | POA: Diagnosis present

## 2023-10-09 LAB — COMPREHENSIVE METABOLIC PANEL
ALT: 39 U/L (ref 0–44)
AST: 47 U/L — ABNORMAL HIGH (ref 15–41)
Albumin: 3.5 g/dL (ref 3.5–5.0)
Alkaline Phosphatase: 59 U/L (ref 38–126)
Anion gap: 11 (ref 5–15)
BUN: 20 mg/dL (ref 8–23)
CO2: 26 mmol/L (ref 22–32)
Calcium: 9 mg/dL (ref 8.9–10.3)
Chloride: 96 mmol/L — ABNORMAL LOW (ref 98–111)
Creatinine, Ser: 1.12 mg/dL — ABNORMAL HIGH (ref 0.44–1.00)
GFR, Estimated: 48 mL/min — ABNORMAL LOW (ref >60)
Glucose, Bld: 122 mg/dL — ABNORMAL HIGH (ref 70–99)
Potassium: 3.9 mmol/L (ref 3.5–5.1)
Sodium: 133 mmol/L — ABNORMAL LOW (ref 135–145)
Total Bilirubin: 0.8 mg/dL (ref 0.0–1.2)
Total Protein: 6.6 g/dL (ref 6.5–8.1)

## 2023-10-09 LAB — I-STAT CG4 LACTIC ACID, ED
Lactic Acid, Venous: 2.1 mmol/L (ref 0.5–1.9)
Lactic Acid, Venous: 2.7 mmol/L (ref 0.5–1.9)

## 2023-10-09 LAB — CBC WITH DIFFERENTIAL/PLATELET
Abs Immature Granulocytes: 0.05 10*3/uL (ref 0.00–0.07)
Basophils Absolute: 0 10*3/uL (ref 0.0–0.1)
Basophils Relative: 0 %
Eosinophils Absolute: 0 10*3/uL (ref 0.0–0.5)
Eosinophils Relative: 0 %
HCT: 42 % (ref 36.0–46.0)
Hemoglobin: 13.5 g/dL (ref 12.0–15.0)
Immature Granulocytes: 0 %
Lymphocytes Relative: 6 %
Lymphs Abs: 0.7 10*3/uL (ref 0.7–4.0)
MCH: 31.8 pg (ref 26.0–34.0)
MCHC: 32.1 g/dL (ref 30.0–36.0)
MCV: 98.8 fL (ref 80.0–100.0)
Monocytes Absolute: 0.8 10*3/uL (ref 0.1–1.0)
Monocytes Relative: 7 %
Neutro Abs: 10.2 10*3/uL — ABNORMAL HIGH (ref 1.7–7.7)
Neutrophils Relative %: 87 %
Platelets: 232 10*3/uL (ref 150–400)
RBC: 4.25 MIL/uL (ref 3.87–5.11)
RDW: 15.1 % (ref 11.5–15.5)
WBC: 11.8 10*3/uL — ABNORMAL HIGH (ref 4.0–10.5)
nRBC: 0 % (ref 0.0–0.2)

## 2023-10-09 LAB — RESP PANEL BY RT-PCR (RSV, FLU A&B, COVID)  RVPGX2
Influenza A by PCR: NEGATIVE
Influenza B by PCR: NEGATIVE
Resp Syncytial Virus by PCR: NEGATIVE
SARS Coronavirus 2 by RT PCR: NEGATIVE

## 2023-10-09 LAB — BRAIN NATRIURETIC PEPTIDE: B Natriuretic Peptide: 570.5 pg/mL — ABNORMAL HIGH (ref 0.0–100.0)

## 2023-10-09 MED ORDER — ONDANSETRON HCL 4 MG/2ML IJ SOLN: 4.0000 mg | Freq: Three times a day (TID) | INTRAMUSCULAR | Status: DC | PRN

## 2023-10-09 MED ORDER — BUPROPION HCL ER (XL) 150 MG PO TB24
150.0000 mg | ORAL_TABLET | Freq: Every morning | ORAL | Status: DC
  Administered 2023-10-10 – 2023-10-13 (×4): 150 mg via ORAL
  Filled 2023-10-09 (×4): qty 1

## 2023-10-09 MED ORDER — LACTATED RINGERS IV BOLUS
1000.0000 mL | Freq: Once | INTRAVENOUS | Status: AC
  Administered 2023-10-09: 1000 mL via INTRAVENOUS

## 2023-10-09 MED ORDER — ALPRAZOLAM 0.25 MG PO TABS
0.2500 mg | ORAL_TABLET | Freq: Every evening | ORAL | Status: DC | PRN
  Administered 2023-10-12 (×2): 0.25 mg via ORAL
  Filled 2023-10-09 (×2): qty 1

## 2023-10-09 MED ORDER — SODIUM CHLORIDE 0.9 % IV SOLN
1.0000 g | Freq: Once | INTRAVENOUS | Status: AC
  Administered 2023-10-09: 1 g via INTRAVENOUS
  Filled 2023-10-09: qty 10

## 2023-10-09 MED ORDER — SODIUM CHLORIDE 0.9 % IV SOLN
500.0000 mg | Freq: Once | INTRAVENOUS | Status: AC
  Administered 2023-10-09: 500 mg via INTRAVENOUS
  Filled 2023-10-09: qty 5

## 2023-10-09 MED ORDER — DILTIAZEM HCL ER COATED BEADS 180 MG PO CP24
180.0000 mg | ORAL_CAPSULE | Freq: Every day | ORAL | Status: DC
Start: 2023-10-10 — End: 2023-10-13
  Administered 2023-10-10 – 2023-10-13 (×4): 180 mg via ORAL
  Filled 2023-10-09 (×5): qty 1

## 2023-10-09 MED ORDER — EZETIMIBE 10 MG PO TABS
10.0000 mg | ORAL_TABLET | Freq: Every day | ORAL | Status: DC
  Administered 2023-10-10 – 2023-10-13 (×4): 10 mg via ORAL
  Filled 2023-10-09 (×4): qty 1

## 2023-10-09 MED ORDER — METOPROLOL TARTRATE 50 MG PO TABS
50.0000 mg | ORAL_TABLET | Freq: Two times a day (BID) | ORAL | Status: DC
Start: 2023-10-09 — End: 2023-10-13
  Administered 2023-10-09 – 2023-10-13 (×8): 50 mg via ORAL
  Filled 2023-10-09 (×8): qty 1

## 2023-10-09 MED ORDER — APIXABAN 5 MG PO TABS
5.0000 mg | ORAL_TABLET | Freq: Two times a day (BID) | ORAL | Status: DC
Start: 2023-10-09 — End: 2023-10-13
  Administered 2023-10-09 – 2023-10-13 (×8): 5 mg via ORAL
  Filled 2023-10-09 (×8): qty 1

## 2023-10-09 MED ORDER — ATORVASTATIN CALCIUM 40 MG PO TABS
40.0000 mg | ORAL_TABLET | Freq: Every day | ORAL | Status: DC
  Administered 2023-10-10 – 2023-10-12 (×3): 40 mg via ORAL
  Filled 2023-10-09 (×3): qty 1

## 2023-10-09 NOTE — ED Notes (Signed)
 ED TO INPATIENT HANDOFF REPORT  Name/Age/Gender Monica Williamson 83 y.o. female  Code Status Code Status History     Date Active Date Inactive Code Status Order ID Comments User Context   02/28/2016 1457 03/01/2016 1640 Full Code [id number]  MinorVilinda Blanks, NP ED   02/24/2014 1253 02/25/2014 2016 Full Code [id number]  Altamese Cabal, PA-C Inpatient   09/30/2013 1320 10/02/2013 1944 Full Code [id number]  Altamese Cabal, PA-C Inpatient       Home/SNF/Other Home  Chief Complaint CAP (community acquired pneumonia) [J18.9]  Level of Care/Admitting Diagnosis ED Disposition     ED Disposition  Admit   Condition  --   Comment  Hospital Area: [facility] [100102]  Level of Care: Telemetry [5]  Admit to tele based on following criteria: Other see comments  Comments: Cap  May place patient in observation at [facility] or [facility] if equivalent level of care is available:: Yes  Covid Evaluation: Symptomatic Person Under Investigation (PUI) or recent exposure (last 10 days) *Testing Required*  Diagnosis: CAP (community acquired pneumonia) [347425]  Admitting Physician: Eduard Clos [phone]  Attending Physician: Eduard Clos [id number]          Medical History Past Medical History:  Diagnosis Date   Anxiety    Arthritis    Atrial fibrillation (HCC)    when takes Warfarin   Atrial fibrillation, currently in sinus rhythm    Bronchiectasis (HCC)    contolled with lovent and allergy med   Childhood asthma    Cholelithiasis    Colon polyps    Complication of anesthesia    Depression    Diverticulosis    Dysrhythmia    sees Dr Elease Hashimoto annually for h/o Afib   H/O bronchiectasis    History of airborne allergies    uses inhalers   Hypertension    Nocturia    Osteopenia    PONV (postoperative nausea and vomiting)    severe n/v post anesthesia    Pulmonary nodule    Sleep related teeth grinding    wears a mouth guard at night   Stress  fracture of tibia 2014   Vitamin D deficiency     Allergies Allergies  Allergen Reactions   Warfarin And Related Hives and Itching   Amoxicillin Itching   Augmentin [Amoxicillin-Pot Clavulanate] Rash   Avelox [Moxifloxacin] Diarrhea    IV Location/Drains/Wounds Patient Lines/Drains/Airways Status     Active Line/Drains/Airways     Name Placement date Placement time Site Days   Peripheral IV 10/09/23 20 G 1" Anterior;Left;Proximal Forearm 10/09/23  1737  Forearm  less than 1   Closed System Drain 1 Left Knee Accordion (Hemovac) 02/24/14  --  Knee  3514            Labs/Imaging Results for orders placed or performed during the hospital encounter of 10/09/23 (from the past 48 hours)  CBC with Differential     Status: Abnormal   Collection Time: 10/09/23  5:28 PM  Result Value Ref Range   WBC 11.8 (H) 4.0 - 10.5 K/uL   RBC 4.25 3.87 - 5.11 MIL/uL   Hemoglobin 13.5 12.0 - 15.0 g/dL   HCT 43.3 29.5 - 18.8 %   MCV 98.8 80.0 - 100.0 fL   MCH 31.8 26.0 - 34.0 pg   MCHC 32.1 30.0 - 36.0 g/dL   RDW 41.6 60.6 - 30.1 %   Platelets 232 150 - 400 K/uL   nRBC 0.0 0.0 -  0.2 %   Neutrophils Relative % 87 %   Neutro Abs 10.2 (H) 1.7 - 7.7 K/uL   Lymphocytes Relative 6 %   Lymphs Abs 0.7 0.7 - 4.0 K/uL   Monocytes Relative 7 %   Monocytes Absolute 0.8 0.1 - 1.0 K/uL   Eosinophils Relative 0 %   Eosinophils Absolute 0.0 0.0 - 0.5 K/uL   Basophils Relative 0 %   Basophils Absolute 0.0 0.0 - 0.1 K/uL   Immature Granulocytes 0 %   Abs Immature Granulocytes 0.05 0.00 - 0.07 K/uL    Comment: Performed at [facility], [address] [place], [place] [address]  Comprehensive metabolic panel     Status: Abnormal   Collection Time: 10/09/23  5:28 PM  Result Value Ref Range   Sodium 133 (L) 135 - 145 mmol/L   Potassium 3.9 3.5 - 5.1 mmol/L   Chloride 96 (L) 98 - 111 mmol/L   CO2 26 22 - 32 mmol/L   Glucose, Bld 122 (H) 70 - 99 mg/dL    Comment: Glucose reference  range applies only to samples taken after fasting for at least 8 hours.   BUN 20 8 - 23 mg/dL   Creatinine, Ser 6.04 (H) 0.44 - 1.00 mg/dL   Calcium 9.0 8.9 - 54.0 mg/dL   Total Protein 6.6 6.5 - 8.1 g/dL   Albumin 3.5 3.5 - 5.0 g/dL   AST 47 (H) 15 - 41 U/L   ALT 39 0 - 44 U/L   Alkaline Phosphatase 59 38 - 126 U/L   Total Bilirubin 0.8 0.0 - 1.2 mg/dL   GFR, Estimated 48 (L) >60 mL/min    Comment: (NOTE) Calculated using the CKD-EPI Creatinine Equation (2021)    Anion gap 11 5 - 15    Comment: Performed at [facility], [address], [place], [place] [address]  Brain natriuretic peptide     Status: Abnormal   Collection Time: 10/09/23  5:28 PM  Result Value Ref Range   B Natriuretic Peptide 570.5 (H) 0.0 - 100.0 pg/mL    Comment: Performed at [facility], [address] [place], [place] [address]  I-Stat CG4 Lactic Acid     Status: Abnormal   Collection Time: 10/09/23  5:51 PM  Result Value Ref Range   Lactic Acid, Venous 2.1 (HH) 0.5 - 1.9 mmol/L   Comment NOTIFIED PHYSICIAN   I-Stat CG4 Lactic Acid     Status: Abnormal   Collection Time: 10/09/23  7:50 PM  Result Value Ref Range   Lactic Acid, Venous 2.7 (HH) 0.5 - 1.9 mmol/L   Comment NOTIFIED PHYSICIAN    DG Chest 2 View Result Date: 10/09/2023 CLINICAL DATA:  Hypoxia EXAM: CHEST - 2 VIEW COMPARISON:  02/28/2016 FINDINGS: Cardiac shadow is within normal limits. Aortic calcifications are noted. The lungs are well aerated bilaterally. Central vascular congestion is noted as well as some patchy opacities bilaterally but particularly in the left base. No bony abnormality is noted. IMPRESSION: Mild central vascular congestion. Bilateral patchy airspace opacities. These may represent edema or underlying infectious process. Electronically Signed   By: Alcide Clever M.D.   On: 10/09/2023 19:39    Pending Labs Unresulted Labs (From admission, onward)     Start     Ordered   10/09/23 1946   Resp panel by RT-PCR (RSV, Flu A&B, Covid) Anterior Nasal Swab  Once,   URGENT        10/09/23 1945  10/09/23 1723  Blood culture (routine x 2)  BLOOD CULTURE X 2,   R (with STAT occurrences)      10/09/23 1722            Vitals/Pain Today's Vitals   10/09/23 1745 10/09/23 1815 10/09/23 1830 10/09/23 1930  BP: (!) 155/142 136/64 (!) 139/96 134/71  Pulse: 95 87 (!) 108 95  Resp: (!) 23 14 17 17   Temp:    98.7 F (37.1 C)  TempSrc:      SpO2: 93% 91% 91% 90%    Isolation Precautions No active isolations  Medications Medications  ondansetron (ZOFRAN) injection 4 mg (has no administration in time range)  azithromycin (ZITHROMAX) 500 mg in sodium chloride 0.9 % 250 mL IVPB (500 mg Intravenous New Bag/Given 10/09/23 1816)  cefTRIAXone (ROCEPHIN) 1 g in sodium chloride 0.9 % 100 mL IVPB (0 g Intravenous Stopped 10/09/23 1817)  lactated ringers bolus 1,000 mL (1,000 mLs Intravenous New Bag/Given 10/09/23 1816)    Mobility walks with person assist

## 2023-10-09 NOTE — ED Notes (Signed)
 Provider ok for patient to eat and drink. Provided patient with [place] sand which and cranberry juice.

## 2023-10-09 NOTE — ED Notes (Signed)
 Patient resting in bed.

## 2023-10-09 NOTE — ED Notes (Signed)
 Patient lactic 2.7 provider notified. No new orders at this time.

## 2023-10-09 NOTE — ED Notes (Signed)
 Patient provided with more cranberry juice as requested. Provider ok with patient eating and drinking

## 2023-10-09 NOTE — ED Triage Notes (Signed)
 Sent from PCP for decreased O2 saturation (86% while ambulating), not responding to neb tx. Pt dx with pneumonia, WBC 12+

## 2023-10-09 NOTE — H&P (Signed)
 History and Physical    Monica Williamson:[id number] DOB: 1941/07/02 DOA: 10/09/2023  Patient coming from: Home.  Chief Complaint: Shortness of breath and cough.  HPI: Monica Williamson is a 83 y.o. female with history of A-fib, hypertension, hyperlipidemia has been experiencing cough and congestion since yesterday.  Patient also had 2-3 episodes of vomiting and felt very sick and weak.  She took some over-the-counter Delsym despite which her cough was persistent and went to her primary care physician where patient's x-ray showed pneumonia and also was hypoxic and was referred to the ER.  Denies any chest pain.  Denies any recent travel or sick contacts.  ED Course: In the ER patient was hypoxic requiring 3 L oxygen with chest x-ray showing bilateral infiltrates.  Cultures were sent and patient was started on empiric antibiotics.  Labs show BNP of 570 WBC of 11.8 sodium 133 creatinine 1.1 COVID and flu test were negative.  Review of Systems: As per HPI, rest all negative.   Past Medical History:  Diagnosis Date   Anxiety    Arthritis    Atrial fibrillation (HCC)    when takes Warfarin   Atrial fibrillation, currently in sinus rhythm    Bronchiectasis (HCC)    contolled with lovent and allergy med   Childhood asthma    Cholelithiasis    Colon polyps    Complication of anesthesia    Depression    Diverticulosis    Dysrhythmia    sees Dr Elease Hashimoto annually for h/o Afib   H/O bronchiectasis    History of airborne allergies    uses inhalers   Hypertension    Nocturia    Osteopenia    PONV (postoperative nausea and vomiting)    severe n/v post anesthesia    Pulmonary nodule    Sleep related teeth grinding    wears a mouth guard at night   Stress fracture of tibia 2014   Vitamin D deficiency     Past Surgical History:  Procedure Laterality Date   BREAST CYST EXCISION Right    over 20 years ago   BREAST SURGERY     CARDIOVERSION N/A 06/27/2019   Procedure:  CARDIOVERSION;  Surgeon: Nahser, Deloris Ping, MD;  Location: [facility] ENDOSCOPY;  Service: Cardiovascular;  Laterality: N/A;   CHOLECYSTECTOMY     11/2010   INCISION AND DRAINAGE BREAST ABSCESS     JOINT REPLACEMENT Right 09/2013   knee   MENISCUS REPAIR Right 2014   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 Her smoking use included cigarettes. She started smoking about 65 years ago. She has a 15 pack-year smoking history. She has never used smokeless tobacco. She reports that she does not use drugs. No history on file for alcohol use.  Allergies  Allergen Reactions   Warfarin And Related Hives and Itching   Amoxicillin Itching   Augmentin [Amoxicillin-Pot Clavulanate] Rash   Avelox [Moxifloxacin] Diarrhea    Family History  Problem Relation Age of Onset  Heart disease Sister    Colon cancer Neg Hx     Prior to Admission medications   Medication Sig Start Date End Date Taking? Authorizing Provider  acetaminophen (TYLENOL) 500 MG tablet Take 1,000 mg by mouth every 6 (six) hours as needed for moderate pain or headache.   Yes [provider]  albuterol (PROVENTIL HFA;VENTOLIN HFA) 108 (90 Base) MCG/ACT inhaler Inhale 2 puffs into the lungs 2 (two) times daily as needed for wheezing or shortness of breath. 03/09/16  Yes Parrett, Tammy S, NP  ALPRAZolam (XANAX) 0.5 MG tablet Take 0.25 mg by mouth at bedtime as needed for anxiety or sleep.    Yes [provider]  apixaban (ELIQUIS) 5 MG TABS tablet Take 1 tablet (5 mg  total) by mouth 2 (two) times daily. 10/05/23  Yes Nahser, Deloris Ping, MD  Ascorbic Acid (VITAMIN C PO) Take 1 tablet by mouth daily.   Yes [provider]  atorvastatin (LIPITOR) 40 MG tablet Take 40 mg by mouth daily at 6 PM.  02/16/17  Yes [provider]  Baclofen 5 MG TABS 1 tablet as needed Orally TID as needed for 30 days 06/21/21  Yes [provider]  buPROPion (WELLBUTRIN XL) 150 MG 24 hr tablet Take 150 mg by mouth every morning.  10/26/10  Yes Nahser, Deloris Ping, MD  cetirizine (ZYRTEC) 10 MG tablet Take 10 mg by mouth daily.   Yes [provider]  Cholecalciferol (VITAMIN D-1000 MAX ST) 25 MCG (1000 UT) tablet Take 1,000 Units by mouth daily.   Yes [provider]  cyclobenzaprine (FLEXERIL) 10 MG tablet Take 10 mg by mouth daily as needed for muscle spasms. 06/18/21  Yes [provider]  denosumab (PROLIA) 60 MG/ML SOSY injection Inject 60 mg into the skin every 6 (six) months. 09/05/13  Yes [provider]  diltiazem (DILT-XR) 180 MG 24 hr capsule Take 1 capsule (180 mg total) by mouth daily. 10/05/23  Yes Nahser, Deloris Ping, MD  ezetimibe (ZETIA) 10 MG tablet Take 10 mg by mouth daily.  03/05/18  Yes [provider]  fluticasone (FLONASE) 50 MCG/ACT nasal spray Place 2 sprays into both nostrils daily. Patient taking differently: Place 2 sprays into both nostrils daily as needed for allergies. 03/31/16  Yes Kalman Shan, MD  fluticasone (FLOVENT HFA) 110 MCG/ACT inhaler Inhale 2 puffs into the lungs 2 (two) times daily as needed (for shortness of breath). 03/09/16  Yes Parrett, Tammy S, NP  L-Lysine 1000 MG TABS Take 1 tablet by mouth as needed (cold sores). 03/19/20  Yes [provider]  metoprolol tartrate (LOPRESSOR) 50 MG tablet Take 1 tablet (50 mg total) by mouth 2 (two) times daily. 03/22/23  Yes Nahser, Deloris Ping, MD  ondansetron (ZOFRAN) 4 MG tablet Take 4 mg by mouth every 6 (six) hours as needed for nausea or  vomiting. 10/09/23  Yes [provider]  psyllium (METAMUCIL) 58.6 % powder Take 1 packet by mouth daily.   Yes [provider]  QVAR REDIHALER 40 MCG/ACT inhaler Inhale 2 puffs into the lungs 2 (two) times daily as needed (sob). 10/06/23  Yes [provider]  Zinc 50 MG TABS 1 tablet Orally Once a day   Yes [provider]  ELIQUIS 5 MG TABS tablet TAKE 1 TABLET BY MOUTH TWICE  DAILY 09/15/22   Nahser, Deloris Ping, MD  pantoprazole (PROTONIX) 40 MG tablet Take 40 mg by mouth daily. Patient not taking: Reported on 10/09/2023 05/06/22  [provider]    Physical Exam: Constitutional: Moderately built and nourished. Vitals:   10/09/23 2015 10/09/23 2053 10/09/23 2105 10/09/23 2134  BP: (!) 160/83  (!) 149/72   Pulse: 91  (!) 106   Resp: 13  20   Temp:  99.3 F (37.4 C) 98.5 F (36.9 C)   TempSrc:      SpO2: 92%  95%   Weight:    65.1 kg  Height:    5\' 6"  (1.676 m)   Eyes: Anicteric no pallor. ENMT: No discharge from the ears eyes nose or mouth. Neck: No mass felt.  No neck rigidity. Respiratory: No rhonchi or crepitations. Cardiovascular: S1-S2 heard. Abdomen: Soft nontender bowel sound present. Musculoskeletal: No edema. Skin: No rash. Neurologic: Alert awake oriented to time place and person.  Moves all extremities. Psychiatric: Appears normal.  Normal affect.   Labs on Admission: I have personally reviewed following labs and imaging studies  CBC: Recent Labs  Lab 10/09/23 1728  WBC 11.8*  NEUTROABS 10.2*  HGB 13.5  HCT 42.0  MCV 98.8  PLT 232   Basic Metabolic Panel: Recent Labs  Lab 10/09/23 1728  NA 133*  K 3.9  CL 96*  CO2 26  GLUCOSE 122*  BUN 20  CREATININE 1.12*  CALCIUM 9.0   GFR: Estimated Creatinine Clearance: 35 mL/min (A) (by C-G formula based on SCr of 1.12 mg/dL (H)). Liver Function Tests: Recent Labs  Lab 10/09/23 1728  AST 47*  ALT 39  ALKPHOS 59  BILITOT 0.8  PROT 6.6  ALBUMIN 3.5   No  results for input(s): "LIPASE", "AMYLASE" in the last 168 hours. No results for input(s): "AMMONIA" in the last 168 hours. Coagulation Profile: No results for input(s): "INR", "PROTIME" in the last 168 hours. Cardiac Enzymes: No results for input(s): "CKTOTAL", "CKMB", "CKMBINDEX", "TROPONINI" in the last 168 hours. BNP (last 3 results) No results for input(s): "PROBNP" in the last 8760 hours. HbA1C: No results for input(s): "HGBA1C" in the last 72 hours. CBG: No results for input(s): "GLUCAP" in the last 168 hours. Lipid Profile: No results for input(s): "CHOL", "HDL", "LDLCALC", "TRIG", "CHOLHDL", "LDLDIRECT" in the last 72 hours. Thyroid Function Tests: No results for input(s): "TSH", "T4TOTAL", "FREET4", "T3FREE", "THYROIDAB" in the last 72 hours. Anemia Panel: No results for input(s): "VITAMINB12", "FOLATE", "FERRITIN", "TIBC", "IRON", "RETICCTPCT" in the last 72 hours. Urine analysis:    Component Value Date/Time   COLORURINE YELLOW 02/28/2016 1226   APPEARANCEUR CLEAR 02/28/2016 1226   LABSPEC 1.022 02/28/2016 1226   PHURINE 5.0 02/28/2016 1226   GLUCOSEU NEGATIVE 02/28/2016 1226   HGBUR MODERATE (A) 02/28/2016 1226   BILIRUBINUR NEGATIVE 02/28/2016 1226   KETONESUR 15 (A) 02/28/2016 1226   PROTEINUR 30 (A) 02/28/2016 1226   UROBILINOGEN 0.2 02/17/2014 0940   NITRITE NEGATIVE 02/28/2016 1226   LEUKOCYTESUR NEGATIVE 02/28/2016 1226   Sepsis Labs: @LABRCNTIP (procalcitonin:4,lacticidven:4) ) Recent Results (from the past 240 hours)  Resp panel by RT-PCR (RSV, Flu A&B, Covid) Anterior Nasal Swab     Status: None   Collection Time: 10/09/23  7:51 PM   Specimen: Anterior Nasal Swab  Result Value Ref Range Status   SARS Coronavirus 2 by RT PCR NEGATIVE NEGATIVE Final    Comment: (NOTE) SARS-CoV-2 target nucleic acids are NOT DETECTED.  The SARS-CoV-2 RNA is generally detectable in upper respiratory specimens during the acute phase of infection. The lowest concentration  of SARS-CoV-2 viral copies this assay can detect is 138 copies/mL. A negative result  does not preclude SARS-Cov-2 infection and should not be used as the sole basis for treatment or other patient management decisions. A negative result may occur with  improper specimen collection/handling, submission of specimen other than nasopharyngeal swab, presence of viral mutation(s) within the areas targeted by this assay, and inadequate number of viral copies(<138 copies/mL). A negative result must be combined with clinical observations, patient history, and epidemiological information. The expected result is Negative.  Fact Sheet for Patients:  BloggerCourse.com  Fact Sheet for Healthcare Providers:  SeriousBroker.it  This test is no t yet approved or cleared by the [place] FDA and  has been authorized for detection and/or diagnosis of SARS-CoV-2 by FDA under an Emergency Use Authorization (EUA). This EUA will remain  in effect (meaning this test can be used) for the duration of the COVID-19 declaration under Section 564(b)(1) of the Act, 21 U.S.C.section 360bbb-3(b)(1), unless the authorization is terminated  or revoked sooner.       Influenza A by PCR NEGATIVE NEGATIVE Final   Influenza B by PCR NEGATIVE NEGATIVE Final    Comment: (NOTE) The Xpert Xpress SARS-CoV-2/FLU/RSV plus assay is intended as an aid in the diagnosis of influenza from Nasopharyngeal swab specimens and should not be used as a sole basis for treatment. Nasal washings and aspirates are unacceptable for Xpert Xpress SARS-CoV-2/FLU/RSV testing.  Fact Sheet for Patients: BloggerCourse.com  Fact Sheet for Healthcare Providers: SeriousBroker.it  This test is not yet approved or cleared by the [place] FDA and has been authorized for detection and/or diagnosis of SARS-CoV-2 by FDA under an Emergency Use  Authorization (EUA). This EUA will remain in effect (meaning this test can be used) for the duration of the COVID-19 declaration under Section 564(b)(1) of the Act, 21 U.S.C. section 360bbb-3(b)(1), unless the authorization is terminated or revoked.     Resp Syncytial Virus by PCR NEGATIVE NEGATIVE Final    Comment: (NOTE) Fact Sheet for Patients: BloggerCourse.com  Fact Sheet for Healthcare Providers: SeriousBroker.it  This test is not yet approved or cleared by the [place] FDA and has been authorized for detection and/or diagnosis of SARS-CoV-2 by FDA under an Emergency Use Authorization (EUA). This EUA will remain in effect (meaning this test can be used) for the duration of the COVID-19 declaration under Section 564(b)(1) of the Act, 21 U.S.C. section 360bbb-3(b)(1), unless the authorization is terminated or revoked.  Performed at [facility], [address], [place], [place] [address]      Radiological Exams on Admission: DG Chest 2 View Result Date: 10/09/2023 CLINICAL DATA:  Hypoxia EXAM: CHEST - 2 VIEW COMPARISON:  02/28/2016 FINDINGS: Cardiac shadow is within normal limits. Aortic calcifications are noted. The lungs are well aerated bilaterally. Central vascular congestion is noted as well as some patchy opacities bilaterally but particularly in the left base. No bony abnormality is noted. IMPRESSION: Mild central vascular congestion. Bilateral patchy airspace opacities. These may represent edema or underlying infectious process. Electronically Signed   By: Alcide Clever M.D.   On: 10/09/2023 19:39    EKG: Independently reviewed.  A-fib initial heart rate was 103 bpm.  Assessment/Plan Principal Problem:   Acute respiratory failure with hypoxia (HCC) Active Problems:   BRONCHIECTASIS   PAF (paroxysmal atrial fibrillation) (HCC)   Long term (current) use of anticoagulants   CAP (community  acquired pneumonia)    Acute respiratory failure with hypoxia likely from pneumonia for which patient has been placed on empiric antibiotics.  Follow cultures.  Check respiratory viral panel.  Follow lactate levels. A-fib initial heart rate was elevated improved with fluids.  On Eliquis metoprolol and Cardizem. Hypertension on Cardizem and metoprolol. Hyperlipidemia on statins and Zetia. Nausea vomiting likely from pneumonia.  Patient does have chronic diarrhea. Acute renal failure likely from nausea vomiting did receive fluids in the ER we will hold off further fluids given elevated BNP.  Check metabolic panel. Anxiety and depression on Wellbutrin and as needed Xanax.  Since patient has acute respiratory failure with pneumonia will need close monitoring and more than 2 midnight stay.   DVT prophylaxis: Eliquis. Code Status: Full code. Family Communication: Discussed with patient. Disposition Plan: Monitored bed. Consults called: None. Admission status: Observation.

## 2023-10-09 NOTE — ED Notes (Signed)
 Patient placed on 4L [place] due to sats 88% on room air

## 2023-10-09 NOTE — ED Notes (Signed)
 Provider at bedside and informed of patient being on 4L [place] due to her sats at 88% on room air. Put on 2L [employer] initially and there was no improvement. Patient bumped up to 4L and sats at 94% when patients mouth is closed. Patient breaths with her mouth open and sats at 82-93%.

## 2023-10-09 NOTE — ED Provider Notes (Signed)
 [place] [facility] AT [facility] Provider Note   CSN: [id number] Arrival date & time: 10/09/23  1548     History {Add pertinent medical, surgical, social history, OB history to HPI:1} Chief Complaint  Patient presents with   Pneumonia    Monica Williamson is a 83 y.o. female with PMH as listed below who presents with cough productive of dark brown sputum associated with mild shortness of breath with ambulation.  She was seen by her PCP today and diagnosed with multifocal pneumonia.  Labs demonstrated WBC 12 with bandemia.  She was noted in the clinic to have decreased O2 saturation (86% while ambulating), sent to ED for admission.  Patient has history of A-fib and takes Eliquis, no missed doses.  No history of DVT or PE, no leg swelling.  She denies any chest pain, fever/chills.  Does endorse nausea vomiting, had some vomiting overnight but does not feel currently nauseated.  Denies any abdominal pain.   Past Medical History:  Diagnosis Date   Anxiety    Arthritis    Atrial fibrillation (HCC)    when takes Warfarin   Atrial fibrillation, currently in sinus rhythm    Bronchiectasis (HCC)    contolled with lovent and allergy med   Childhood asthma    Cholelithiasis    Colon polyps    Complication of anesthesia    Depression    Diverticulosis    Dysrhythmia    sees Dr Elease Hashimoto annually for h/o Afib   H/O bronchiectasis    History of airborne allergies    uses inhalers   Hypertension    Nocturia    Osteopenia    PONV (postoperative nausea and vomiting)    severe n/v post anesthesia    Pulmonary nodule    Sleep related teeth grinding    wears a mouth guard at night   Stress fracture of tibia 2014   Vitamin D deficiency        Home Medications Prior to Admission medications   Medication Sig Start Date End Date Taking? Authorizing Provider  acetaminophen (TYLENOL) 500 MG tablet Take 1,000 mg by mouth every 6 (six) hours as needed for moderate  pain or headache.   Yes [provider]  albuterol (PROVENTIL HFA;VENTOLIN HFA) 108 (90 Base) MCG/ACT inhaler Inhale 2 puffs into the lungs 2 (two) times daily as needed for wheezing or shortness of breath. 03/09/16  Yes Parrett, Tammy S, NP  ALPRAZolam (XANAX) 0.5 MG tablet Take 0.25 mg by mouth at bedtime as needed for anxiety or sleep.    Yes [provider]  apixaban (ELIQUIS) 5 MG TABS tablet Take 1 tablet (5 mg total) by mouth 2 (two) times daily. 10/05/23  Yes Nahser, Deloris Ping, MD  atorvastatin (LIPITOR) 40 MG tablet Take 40 mg by mouth daily at 6 PM.  02/16/17  Yes [provider]  buPROPion (WELLBUTRIN XL) 150 MG 24 hr tablet Take 150 mg by mouth every morning.  10/26/10  Yes Nahser, Deloris Ping, MD  cetirizine (ZYRTEC) 10 MG tablet Take 10 mg by mouth daily.   Yes [provider]  ezetimibe (ZETIA) 10 MG tablet Take 10 mg by mouth daily.  03/05/18  Yes [provider]  fluticasone (FLOVENT HFA) 110 MCG/ACT inhaler Inhale 2 puffs into the lungs 2 (two) times daily as needed (for shortness of breath). 03/09/16  Yes Parrett, Tammy S, NP  metoprolol tartrate (LOPRESSOR) 50 MG tablet Take 1 tablet (50 mg total) by mouth  2 (two) times daily. 03/22/23  Yes Nahser, Deloris Ping, MD  ondansetron (ZOFRAN) 4 MG tablet Take 4 mg by mouth every 6 (six) hours as needed for nausea or vomiting. 10/09/23  Yes [provider]  QVAR REDIHALER 40 MCG/ACT inhaler Inhale 2 puffs into the lungs 2 (two) times daily as needed (sob). 10/06/23  Yes [provider]  Ascorbic Acid (VITAMIN C PO) Take 1 tablet by mouth daily.    [provider]  Baclofen 5 MG TABS 1 tablet as needed Orally TID as needed for 30 days 06/21/21   [provider]  Cholecalciferol (VITAMIN D-1000 MAX ST) 25 MCG (1000 UT) tablet Take 1,000 Units by mouth daily.    [provider]  cyclobenzaprine (FLEXERIL) 10 MG tablet Take 10 mg by mouth daily as needed for muscle spasms.  06/18/21   [provider]  denosumab (PROLIA) 60 MG/ML SOSY injection Inject 60 mg into the skin every 6 (six) months. 09/05/13   [provider]  diltiazem (DILT-XR) 180 MG 24 hr capsule Take 1 capsule (180 mg total) by mouth daily. 10/05/23   Nahser, Deloris Ping, MD  ELIQUIS 5 MG TABS tablet TAKE 1 TABLET BY MOUTH TWICE  DAILY 09/15/22   Nahser, Deloris Ping, MD  fluticasone [facility]) 50 MCG/ACT nasal spray Place 2 sprays into both nostrils daily. Patient taking differently: Place 2 sprays into both nostrils daily as needed for allergies. 03/31/16   Kalman Shan, MD  L-Lysine 1000 MG TABS as directed Orally 03/19/20   [provider]  pantoprazole (PROTONIX) 40 MG tablet Take 40 mg by mouth daily. 05/06/22   [provider]  psyllium (METAMUCIL) 58.6 % powder Take 1 packet by mouth daily.    [provider]  Zinc 50 MG TABS 1 tablet Orally Once a day    [provider]      Allergies    Warfarin and related, Amoxicillin, Augmentin [amoxicillin-pot clavulanate], and Avelox [moxifloxacin]    Review of Systems   Review of Systems A 10 point review of systems was performed and is negative unless otherwise reported in HPI.  Physical Exam Updated Vital Signs BP (!) 139/96   Pulse (!) 108   Temp 98.4 F (36.9 C) (Oral)   Resp 17   SpO2 91%  Physical Exam General: Normal appearing {Desc; female/female:11659}, lying in bed.  HEENT: PERRLA, Sclera anicteric, MMM, trachea midline.  Cardiology: RRR, no murmurs/rubs/gallops. BL radial and DP pulses equal bilaterally.  Resp: Normal respiratory rate and effort. CTAB, no wheezes, rhonchi, crackles.  Abd: Soft, non-tender, non-distended. No rebound tenderness or guarding.  GU: Deferred. MSK: No peripheral edema or signs of trauma. Extremities without deformity or TTP. No cyanosis or clubbing. Skin: warm, dry. No rashes or lesions. Back: No CVA tenderness Neuro: A&Ox4, CNs II-XII grossly intact. MAEs.  Sensation grossly intact.  Psych: Normal mood and affect.   ED Results / Procedures / Treatments   Labs (all labs ordered are listed, but only abnormal results are displayed) Labs Reviewed  CBC WITH DIFFERENTIAL/PLATELET - Abnormal; Notable for the following components:      Result Value   WBC 11.8 (*)    Neutro Abs 10.2 (*)    All other components within normal limits  COMPREHENSIVE METABOLIC PANEL - Abnormal; Notable for the following components:   Sodium 133 (*)    Chloride 96 (*)    Glucose, Bld 122 (*)    Creatinine, Ser 1.12 (*)    AST 47 (*)  GFR, Estimated 48 (*)    All other components within normal limits  I-STAT CG4 LACTIC ACID, ED - Abnormal; Notable for the following components:   Lactic Acid, Venous 2.1 (*)    All other components within normal limits  CULTURE, BLOOD (ROUTINE X 2)  CULTURE, BLOOD (ROUTINE X 2)  RESP PANEL BY RT-PCR (RSV, FLU A&B, COVID)  RVPGX2  BRAIN NATRIURETIC PEPTIDE  I-STAT CG4 LACTIC ACID, ED    EKG EKG Interpretation Date/Time:  Monday October 09 2023 17:22:19 EDT Ventricular Rate:  103 PR Interval:    QRS Duration:  134 QT Interval:  370 QTC Calculation: 485 R Axis:   82  Text Interpretation: Atrial fibrillation Right bundle branch block Confirmed by Vivi Barrack [phone]) on 10/09/2023 6:02:03 PM  Radiology DG Chest 2 View Result Date: 10/09/2023 CLINICAL DATA:  Hypoxia EXAM: CHEST - 2 VIEW COMPARISON:  02/28/2016 FINDINGS: Cardiac shadow is within normal limits. Aortic calcifications are noted. The lungs are well aerated bilaterally. Central vascular congestion is noted as well as some patchy opacities bilaterally but particularly in the left base. No bony abnormality is noted. IMPRESSION: Mild central vascular congestion. Bilateral patchy airspace opacities. These may represent edema or underlying infectious process. Electronically Signed   By: Alcide Clever M.D.   On: 10/09/2023 19:39    Procedures Procedures  {Document cardiac  monitor, telemetry assessment procedure when appropriate:1}  Medications Ordered in ED Medications  ondansetron (ZOFRAN) injection 4 mg (has no administration in time range)  azithromycin (ZITHROMAX) 500 mg in sodium chloride 0.9 % 250 mL IVPB (500 mg Intravenous New Bag/Given 10/09/23 1816)  cefTRIAXone (ROCEPHIN) 1 g in sodium chloride 0.9 % 100 mL IVPB (0 g Intravenous Stopped 10/09/23 1817)  lactated ringers bolus 1,000 mL (1,000 mLs Intravenous New Bag/Given 10/09/23 1816)    ED Course/ Medical Decision Making/ A&P                          Medical Decision Making Amount and/or Complexity of Data Reviewed Labs: ordered. Decision-making details documented in ED Course. Radiology: ordered.  Risk Prescription drug management. Decision regarding hospitalization.    This patient presents to the ED for concern of hypoxic resp failure w/ PNA, this involves an extensive number of treatment options, and is a complaint that carries with it a high risk of complications and morbidity.  I considered the following differential and admission for this acute, potentially life threatening condition.   MDM:    DDX for dyspnea includes but is not limited to:  Does have h/o bronchiectasis but not significant wheezing on exam and O2 did not improve w/ nebulizer treatments in clinic. No CP, signs/sxs of DVT to indicate PE and she takes eliquis daily, compliant. No pleural effusion/pulm edema noted on CXR, no peripheral edema to indicate HF. She is afebrile, intermittently mildly tachycardic into 90s-100s Afib, and is tachypneic, so blood cultures drawn and abx/fluids initiated. Overall patient is non-toxic appearing and satting 91-93% on RA at rest, no resp distress. Will be admitted to medicine. Viral panel pending.   Clinical Course as of 10/09/23 1947  Mon Oct 09, 2023  1721 CXR demonstrates multifocal PNA, L>R. [HN]  1722 Has allergy to penicillins but has tolerated cephalosporins in the past.  Ordered blood cultures and ceftriaxone/azithromycin. [HN]  1801 Lactic Acid, Venous(!!): 2.1 Mildly elevated, receiving fluids [HN]  1844 WBC(!): 11.8 +leukocytosis [HN]    Clinical Course User Index [HN] Loetta Rough, MD  Labs: I Ordered, and personally interpreted labs.  The pertinent results include:  those listed above  Imaging Studies ordered: I ordered imaging studies including CXR I independently visualized and interpreted imaging. I agree with the radiologist interpretation  Additional history obtained from chart review.    Cardiac Monitoring: The patient was maintained on a cardiac monitor.  I personally viewed and interpreted the cardiac monitored which showed an underlying rhythm of: rate controlled Afib  Reevaluation: After the interventions noted above, I reevaluated the patient and found that they have :stayed the same  Social Determinants of Health: Lives independently  Disposition:  admitted to medicine  Co morbidities that complicate the patient evaluation  Past Medical History:  Diagnosis Date   Anxiety    Arthritis    Atrial fibrillation (HCC)    when takes Warfarin   Atrial fibrillation, currently in sinus rhythm    Bronchiectasis (HCC)    contolled with lovent and allergy med   Childhood asthma    Cholelithiasis    Colon polyps    Complication of anesthesia    Depression    Diverticulosis    Dysrhythmia    sees Dr Elease Hashimoto annually for h/o Afib   H/O bronchiectasis    History of airborne allergies    uses inhalers   Hypertension    Nocturia    Osteopenia    PONV (postoperative nausea and vomiting)    severe n/v post anesthesia    Pulmonary nodule    Sleep related teeth grinding    wears a mouth guard at night   Stress fracture of tibia 2014   Vitamin D deficiency      Medicines Meds ordered this encounter  Medications   azithromycin (ZITHROMAX) 500 mg in sodium chloride 0.9 % 250 mL IVPB    Antibiotic Indication::   CAP    cefTRIAXone (ROCEPHIN) 1 g in sodium chloride 0.9 % 100 mL IVPB    Antibiotic Indication::   CAP   lactated ringers bolus 1,000 mL   ondansetron (ZOFRAN) injection 4 mg    I have reviewed the patients home medicines and have made adjustments as needed  Problem List / ED Course: Problem List Items Addressed This Visit   None Visit Diagnoses       Pneumonia of left lower lobe due to infectious organism    -  Primary   Relevant Medications   azithromycin (ZITHROMAX) 500 mg in sodium chloride 0.9 % 250 mL IVPB (Completed)   cefTRIAXone (ROCEPHIN) 1 g in sodium chloride 0.9 % 100 mL IVPB (Completed)   QVAR REDIHALER 40 MCG/ACT inhaler            {Document critical care time when appropriate:1} {Document review of labs and clinical decision tools ie heart score, Chads2Vasc2 etc:1}  {Document your independent review of radiology images, and any outside records:1} {Document your discussion with family members, caretakers, and with consultants:1} {Document social determinants of health affecting pt's care:1} {Document your decision making why or why not admission, treatments were needed:1}  This note was created using dictation software, which may contain spelling or grammatical errors.

## 2023-10-09 NOTE — Plan of Care (Signed)
  Problem: Clinical Measurements: Goal: Respiratory complications will improve Outcome: Progressing   Problem: Nutrition: Goal: Adequate nutrition will be maintained Outcome: Progressing   Problem: Safety: Goal: Ability to remain free from injury will improve Outcome: Progressing   

## 2023-10-10 DIAGNOSIS — Z7901 Long term (current) use of anticoagulants: Secondary | ICD-10-CM | POA: Diagnosis not present

## 2023-10-10 DIAGNOSIS — J9601 Acute respiratory failure with hypoxia: Secondary | ICD-10-CM | POA: Diagnosis present

## 2023-10-10 DIAGNOSIS — I480 Paroxysmal atrial fibrillation: Secondary | ICD-10-CM | POA: Diagnosis present

## 2023-10-10 DIAGNOSIS — Z79899 Other long term (current) drug therapy: Secondary | ICD-10-CM | POA: Diagnosis not present

## 2023-10-10 DIAGNOSIS — Z880 Allergy status to penicillin: Secondary | ICD-10-CM | POA: Diagnosis not present

## 2023-10-10 DIAGNOSIS — F32A Depression, unspecified: Secondary | ICD-10-CM | POA: Diagnosis present

## 2023-10-10 DIAGNOSIS — F419 Anxiety disorder, unspecified: Secondary | ICD-10-CM | POA: Diagnosis present

## 2023-10-10 DIAGNOSIS — B9781 Human metapneumovirus as the cause of diseases classified elsewhere: Secondary | ICD-10-CM | POA: Diagnosis present

## 2023-10-10 DIAGNOSIS — E785 Hyperlipidemia, unspecified: Secondary | ICD-10-CM | POA: Diagnosis present

## 2023-10-10 DIAGNOSIS — I10 Essential (primary) hypertension: Secondary | ICD-10-CM | POA: Diagnosis present

## 2023-10-10 DIAGNOSIS — Z90710 Acquired absence of both cervix and uterus: Secondary | ICD-10-CM | POA: Diagnosis not present

## 2023-10-10 DIAGNOSIS — Z888 Allergy status to other drugs, medicaments and biological substances status: Secondary | ICD-10-CM | POA: Diagnosis not present

## 2023-10-10 DIAGNOSIS — N179 Acute kidney failure, unspecified: Secondary | ICD-10-CM | POA: Diagnosis present

## 2023-10-10 DIAGNOSIS — Z7951 Long term (current) use of inhaled steroids: Secondary | ICD-10-CM | POA: Diagnosis not present

## 2023-10-10 DIAGNOSIS — Z881 Allergy status to other antibiotic agents status: Secondary | ICD-10-CM | POA: Diagnosis not present

## 2023-10-10 DIAGNOSIS — J208 Acute bronchitis due to other specified organisms: Secondary | ICD-10-CM | POA: Diagnosis present

## 2023-10-10 DIAGNOSIS — J45909 Unspecified asthma, uncomplicated: Secondary | ICD-10-CM | POA: Diagnosis present

## 2023-10-10 DIAGNOSIS — Z8249 Family history of ischemic heart disease and other diseases of the circulatory system: Secondary | ICD-10-CM | POA: Diagnosis not present

## 2023-10-10 DIAGNOSIS — Z96653 Presence of artificial knee joint, bilateral: Secondary | ICD-10-CM | POA: Diagnosis present

## 2023-10-10 DIAGNOSIS — Z1152 Encounter for screening for COVID-19: Secondary | ICD-10-CM | POA: Diagnosis not present

## 2023-10-10 DIAGNOSIS — Z87891 Personal history of nicotine dependence: Secondary | ICD-10-CM | POA: Diagnosis not present

## 2023-10-10 DIAGNOSIS — J189 Pneumonia, unspecified organism: Secondary | ICD-10-CM | POA: Diagnosis present

## 2023-10-10 LAB — RESPIRATORY PANEL BY PCR

## 2023-10-10 LAB — BASIC METABOLIC PANEL
Anion gap: 4 — ABNORMAL LOW (ref 5–15)
BUN: 14 mg/dL (ref 8–23)
CO2: 30 mmol/L (ref 22–32)
Calcium: 8.5 mg/dL — ABNORMAL LOW (ref 8.9–10.3)
Chloride: 97 mmol/L — ABNORMAL LOW (ref 98–111)
Creatinine, Ser: 0.86 mg/dL (ref 0.44–1.00)
GFR, Estimated: >60 mL/min (ref >60)
Glucose, Bld: 116 mg/dL — ABNORMAL HIGH (ref 70–99)
Potassium: 4 mmol/L (ref 3.5–5.1)
Sodium: 131 mmol/L — ABNORMAL LOW (ref 135–145)

## 2023-10-10 LAB — EXPECTORATED SPUTUM ASSESSMENT W GRAM STAIN, RFLX TO RESP C

## 2023-10-10 LAB — CBC
HCT: 39.2 % (ref 36.0–46.0)
Hemoglobin: 12.5 g/dL (ref 12.0–15.0)
MCH: 32.1 pg (ref 26.0–34.0)
MCHC: 31.9 g/dL (ref 30.0–36.0)
MCV: 100.5 fL — ABNORMAL HIGH (ref 80.0–100.0)
Platelets: 179 10*3/uL (ref 150–400)
RBC: 3.9 MIL/uL (ref 3.87–5.11)
RDW: 15.1 % (ref 11.5–15.5)
WBC: 9.6 10*3/uL (ref 4.0–10.5)
nRBC: 0 % (ref 0.0–0.2)

## 2023-10-10 LAB — LACTIC ACID, PLASMA
Lactic Acid, Venous: 1 mmol/L (ref 0.5–1.9)
Lactic Acid, Venous: 1.6 mmol/L (ref 0.5–1.9)

## 2023-10-10 LAB — PROCALCITONIN: Procalcitonin: 0.29 ng/mL

## 2023-10-10 LAB — GLUCOSE, CAPILLARY: Glucose-Capillary: 184 mg/dL — ABNORMAL HIGH (ref 70–99)

## 2023-10-10 MED ORDER — SODIUM CHLORIDE 0.9 % IV SOLN
2.0000 g | INTRAVENOUS | Status: DC
  Administered 2023-10-10: 2 g via INTRAVENOUS
  Filled 2023-10-10: qty 20

## 2023-10-10 MED ORDER — FUROSEMIDE 10 MG/ML IJ SOLN
40.0000 mg | Freq: Once | INTRAMUSCULAR | Status: AC
Start: 2023-10-10 — End: 2023-10-10
  Administered 2023-10-10: 40 mg via INTRAVENOUS
  Filled 2023-10-10: qty 4

## 2023-10-10 MED ORDER — SODIUM CHLORIDE 0.9 % IV SOLN
500.0000 mg | INTRAVENOUS | Status: DC
  Administered 2023-10-10: 500 mg via INTRAVENOUS
  Filled 2023-10-10: qty 5

## 2023-10-10 MED ORDER — DM-GUAIFENESIN ER 30-600 MG PO TB12
2.0000 | ORAL_TABLET | Freq: Two times a day (BID) | ORAL | Status: DC
  Administered 2023-10-10 – 2023-10-13 (×7): 2 via ORAL
  Filled 2023-10-10 (×7): qty 2

## 2023-10-10 MED ORDER — METHYLPREDNISOLONE SODIUM SUCC 125 MG IJ SOLR
125.0000 mg | Freq: Once | INTRAMUSCULAR | Status: AC
  Administered 2023-10-10: 125 mg via INTRAVENOUS
  Filled 2023-10-10: qty 2

## 2023-10-10 NOTE — Assessment & Plan Note (Signed)
 Continue Wellbutrin and as needed Xanax

## 2023-10-10 NOTE — Assessment & Plan Note (Signed)
-   Continue Eliquis, metoprolol, Cardizem

## 2023-10-10 NOTE — Assessment & Plan Note (Signed)
-   from presumed PNA; appears atypical on CXR - other considered differential would include possible PE - for now adding steroids and monitor response - has remained on 5-6L today

## 2023-10-10 NOTE — Assessment & Plan Note (Addendum)
-   Mild patchy bilateral airspace opacities -COVID, flu, RSV negative -RVP positive for metapneumovirus; PCT equivocal -Discontinue antibiotics - continue steroids given persistent hypoxia

## 2023-10-10 NOTE — Hospital Course (Signed)
 Monica Williamson is a 83 y.o. female with history of A-fib, hypertension, hyperlipidemia has been experiencing cough and congestion.  Patient also had 2-3 episodes of vomiting and felt very sick and weak.  She took some over-the-counter Delsym despite which her cough was persistent and went to her primary care physician where patient's x-ray showed pneumonia and also was hypoxic and was referred to the ER.  Denies any chest pain.  Denies any recent travel or sick contacts.   ED Course: In the ER patient was hypoxic requiring 3 L oxygen with chest x-ray showing bilateral infiltrates.  Cultures were sent and patient was started on empiric antibiotics.  Labs show BNP of 570 WBC of 11.8 sodium 133 creatinine 1.1 COVID and flu test were negative. She was continued on antibiotics and admitted for ongoing workup and monitoring. She was treated with nebulizers and steroids after RPP returned positive with metapneumovirus.  She responded well clinically and was able to be weaned off of oxygen prior to discharge.  Antibiotics were discontinued after RVP returned positive.

## 2023-10-10 NOTE — Progress Notes (Signed)
 Progress Note    Monica Williamson   ZOX:[id number]  DOB: 1940-11-07  DOA: 10/09/2023     0 PCP: Cleatis Polka., MD  Initial CC: Cough  Hospital Course: Monica Williamson is a 83 y.o. female with history of A-fib, hypertension, hyperlipidemia has been experiencing cough and congestion.  Patient also had 2-3 episodes of vomiting and felt very sick and weak.  She took some over-the-counter Delsym despite which her cough was persistent and went to her primary care physician where patient's x-ray showed pneumonia and also was hypoxic and was referred to the ER.  Denies any chest pain.  Denies any recent travel or sick contacts.   ED Course: In the ER patient was hypoxic requiring 3 L oxygen with chest x-ray showing bilateral infiltrates.  Cultures were sent and patient was started on empiric antibiotics.  Labs show BNP of 570 WBC of 11.8 sodium 133 creatinine 1.1 COVID and flu test were negative. She was continued on antibiotics and admitted for ongoing workup and monitoring.  Interval History:  Still with cough, but loosening some today. Mucinex added. Remains on O2 with some increase in O2 requirement this afternoon, but we will continue to try and wean. Steroids also added.   Assessment and Plan: * Acute respiratory failure with hypoxia (HCC) - from presumed PNA; appears atypical on CXR - other considered differential would include possible PE - for now adding steroids and monitor response - has remained on 5-6L today  CAP (community acquired pneumonia) - Mild patchy bilateral airspace opacities -COVID, flu, RSV negative - Follow-up RVP -Check procalcitonin - Continue Rocephin and azithromycin - start steroids given persistent hypoxia  - repeat CXR in am   Anxiety and depression Continue Wellbutrin and as needed Xanax  PAF (paroxysmal atrial fibrillation) (HCC) - Continue Eliquis, metoprolol, Cardizem    Old records reviewed in assessment of this  patient  Antimicrobials: Azithromycin 10/09/2023 >> current Rocephin 10/09/2023 >> current  DVT prophylaxis:   apixaban (ELIQUIS) tablet 5 mg   Code Status:   Code Status: Full Code  Mobility Assessment (Last 72 Hours)     Mobility Assessment     Row Name 10/10/23 0720 10/09/23 2138         Does patient have an order for bedrest or is patient medically unstable No - Continue assessment No - Continue assessment      What is the highest level of mobility based on the progressive mobility assessment? Level 6 (Walks independently in room and hall) - Balance while walking in room without assist - Complete Level 6 (Walks independently in room and hall) - Balance while walking in room without assist - Complete               Barriers to discharge: None Disposition Plan: Home HH orders placed: N/A Status is: Inpatient  Objective: Blood pressure 119/77, pulse (!) 104, temperature 99.2 F (37.3 C), resp. rate 20, height 5\' 6"  (1.676 m), weight 65.1 kg, SpO2 (!) 87%.  Examination:  Physical Exam Constitutional:      Appearance: Normal appearance.  HENT:     Head: Normocephalic and atraumatic.     Mouth/Throat:     Mouth: Mucous membranes are moist.  Eyes:     Extraocular Movements: Extraocular movements intact.  Cardiovascular:     Rate and Rhythm: Normal rate and regular rhythm.  Pulmonary:     Effort: Pulmonary effort is normal. No respiratory distress.     Breath sounds: Rhonchi (scattered)  present. No wheezing.  Abdominal:     General: Bowel sounds are normal. There is no distension.     Palpations: Abdomen is soft.     Tenderness: There is no abdominal tenderness.  Musculoskeletal:        General: Normal range of motion.     Cervical back: Normal range of motion and neck supple.  Skin:    General: Skin is warm and dry.  Neurological:     General: No focal deficit present.     Mental Status: She is alert.  Psychiatric:        Mood and Affect: Mood normal.       Consultants:    Procedures:    Data Reviewed: Results for orders placed or performed during the hospital encounter of 10/09/23 (from the past 24 hours)  CBC with Differential     Status: Abnormal   Collection Time: 10/09/23  5:28 PM  Result Value Ref Range   WBC 11.8 (H) 4.0 - 10.5 K/uL   RBC 4.25 3.87 - 5.11 MIL/uL   Hemoglobin 13.5 12.0 - 15.0 g/dL   HCT 91.4 78.2 - 95.6 %   MCV 98.8 80.0 - 100.0 fL   MCH 31.8 26.0 - 34.0 pg   MCHC 32.1 30.0 - 36.0 g/dL   RDW 21.3 08.6 - 57.8 %   Platelets 232 150 - 400 K/uL   nRBC 0.0 0.0 - 0.2 %   Neutrophils Relative % 87 %   Neutro Abs 10.2 (H) 1.7 - 7.7 K/uL   Lymphocytes Relative 6 %   Lymphs Abs 0.7 0.7 - 4.0 K/uL   Monocytes Relative 7 %   Monocytes Absolute 0.8 0.1 - 1.0 K/uL   Eosinophils Relative 0 %   Eosinophils Absolute 0.0 0.0 - 0.5 K/uL   Basophils Relative 0 %   Basophils Absolute 0.0 0.0 - 0.1 K/uL   Immature Granulocytes 0 %   Abs Immature Granulocytes 0.05 0.00 - 0.07 K/uL  Comprehensive metabolic panel     Status: Abnormal   Collection Time: 10/09/23  5:28 PM  Result Value Ref Range   Sodium 133 (L) 135 - 145 mmol/L   Potassium 3.9 3.5 - 5.1 mmol/L   Chloride 96 (L) 98 - 111 mmol/L   CO2 26 22 - 32 mmol/L   Glucose, Bld 122 (H) 70 - 99 mg/dL   BUN 20 8 - 23 mg/dL   Creatinine, Ser 4.69 (H) 0.44 - 1.00 mg/dL   Calcium 9.0 8.9 - 62.9 mg/dL   Total Protein 6.6 6.5 - 8.1 g/dL   Albumin 3.5 3.5 - 5.0 g/dL   AST 47 (H) 15 - 41 U/L   ALT 39 0 - 44 U/L   Alkaline Phosphatase 59 38 - 126 U/L   Total Bilirubin 0.8 0.0 - 1.2 mg/dL   GFR, Estimated 48 (L) >60 mL/min   Anion gap 11 5 - 15  Brain natriuretic peptide     Status: Abnormal   Collection Time: 10/09/23  5:28 PM  Result Value Ref Range   B Natriuretic Peptide 570.5 (H) 0.0 - 100.0 pg/mL  Blood culture (routine x 2)     Status: None (Preliminary result)   Collection Time: 10/09/23  5:40 PM   Specimen: BLOOD  Result Value Ref Range   Specimen  Description      BLOOD SITE NOT SPECIFIED Performed at [facility], [address] [place], [place] [address]    Special Requests  BOTTLES DRAWN AEROBIC AND ANAEROBIC Blood Culture results may not be optimal due to an inadequate volume of blood received in culture bottles Performed at [facility], [address] [place], [place] [address]    Culture      NO GROWTH < 12 HOURS Performed at [facility], [address], [place], [place] [address]    Report Status PENDING   I-Stat CG4 Lactic Acid     Status: Abnormal   Collection Time: 10/09/23  5:51 PM  Result Value Ref Range   Lactic Acid, Venous 2.1 (HH) 0.5 - 1.9 mmol/L   Comment NOTIFIED PHYSICIAN   Blood culture (routine x 2)     Status: None (Preliminary result)   Collection Time: 10/09/23  5:56 PM   Specimen: Site Not Specified; Blood  Result Value Ref Range   Specimen Description      SITE NOT SPECIFIED Performed at [facility], [address], [place], [place] [address]    Special Requests      BOTTLES DRAWN AEROBIC AND ANAEROBIC Blood Culture results may not be optimal due to an inadequate volume of blood received in culture bottles Performed at [facility], [address], [place], [place] [address]    Culture      NO GROWTH < 12 HOURS Performed at [facility], [address] [place], [place] [address]    Report Status PENDING   I-Stat CG4 Lactic Acid     Status: Abnormal   Collection Time: 10/09/23  7:50 PM  Result Value Ref Range   Lactic Acid, Venous 2.7 (HH) 0.5 - 1.9 mmol/L   Comment NOTIFIED PHYSICIAN   Resp panel by RT-PCR (RSV, Flu A&B, Covid) Anterior Nasal Swab     Status: None   Collection Time: 10/09/23  7:51 PM   Specimen: Anterior Nasal Swab  Result Value Ref Range   SARS Coronavirus 2 by RT PCR NEGATIVE NEGATIVE   Influenza A by PCR NEGATIVE NEGATIVE   Influenza B by PCR NEGATIVE NEGATIVE   Resp Syncytial Virus  by PCR NEGATIVE NEGATIVE  Basic metabolic panel     Status: Abnormal   Collection Time: 10/10/23  5:52 AM  Result Value Ref Range   Sodium 131 (L) 135 - 145 mmol/L   Potassium 4.0 3.5 - 5.1 mmol/L   Chloride 97 (L) 98 - 111 mmol/L   CO2 30 22 - 32 mmol/L   Glucose, Bld 116 (H) 70 - 99 mg/dL   BUN 14 8 - 23 mg/dL   Creatinine, Ser 1.30 0.44 - 1.00 mg/dL   Calcium 8.5 (L) 8.9 - 10.3 mg/dL   GFR, Estimated >86 >57 mL/min   Anion gap 4 (L) 5 - 15  CBC     Status: Abnormal   Collection Time: 10/10/23  5:52 AM  Result Value Ref Range   WBC 9.6 4.0 - 10.5 K/uL   RBC 3.90 3.87 - 5.11 MIL/uL   Hemoglobin 12.5 12.0 - 15.0 g/dL   HCT 84.6 96.2 - 95.2 %   MCV 100.5 (H) 80.0 - 100.0 fL   MCH 32.1 26.0 - 34.0 pg   MCHC 31.9 30.0 - 36.0 g/dL   RDW 84.1 32.4 - 40.1 %   Platelets 179 150 - 400 K/uL   nRBC 0.0 0.0 - 0.2 %  Lactic acid, plasma     Status: None   Collection Time: 10/10/23  7:11 AM  Result Value Ref Range  Lactic Acid, Venous 1.0 0.5 - 1.9 mmol/L  Lactic acid, plasma     Status: None   Collection Time: 10/10/23 10:12 AM  Result Value Ref Range   Lactic Acid, Venous 1.6 0.5 - 1.9 mmol/L    I have reviewed pertinent nursing notes, vitals, labs, and images as necessary. I have ordered labwork to follow up on as indicated.  I have reviewed the last notes from staff over past 24 hours. I have discussed patient's care plan and test results with nursing staff, CM/SW, and other staff as appropriate.  Time spent: Greater than 50% of the 55 minute visit was spent in counseling/coordination of care for the patient as laid out in the A&P.   LOS: 0 days   Lewie Chamber, MD [facility] 10/10/2023, 4:31 PM

## 2023-10-11 ENCOUNTER — Inpatient Hospital Stay (HOSPITAL_COMMUNITY)

## 2023-10-11 DIAGNOSIS — J208 Acute bronchitis due to other specified organisms: Secondary | ICD-10-CM

## 2023-10-11 DIAGNOSIS — J189 Pneumonia, unspecified organism: Secondary | ICD-10-CM | POA: Diagnosis not present

## 2023-10-11 DIAGNOSIS — B9781 Human metapneumovirus as the cause of diseases classified elsewhere: Secondary | ICD-10-CM | POA: Diagnosis not present

## 2023-10-11 DIAGNOSIS — J9601 Acute respiratory failure with hypoxia: Secondary | ICD-10-CM | POA: Diagnosis not present

## 2023-10-11 LAB — BASIC METABOLIC PANEL
Anion gap: 9 (ref 5–15)
BUN: 15 mg/dL (ref 8–23)
CO2: 27 mmol/L (ref 22–32)
Calcium: 7.9 mg/dL — ABNORMAL LOW (ref 8.9–10.3)
Chloride: 96 mmol/L — ABNORMAL LOW (ref 98–111)
Creatinine, Ser: 0.71 mg/dL (ref 0.44–1.00)
GFR, Estimated: >60 mL/min (ref >60)
Glucose, Bld: 153 mg/dL — ABNORMAL HIGH (ref 70–99)
Potassium: 3.6 mmol/L (ref 3.5–5.1)
Sodium: 132 mmol/L — ABNORMAL LOW (ref 135–145)

## 2023-10-11 LAB — CBC WITH DIFFERENTIAL/PLATELET
Abs Immature Granulocytes: 0.05 10*3/uL (ref 0.00–0.07)
Basophils Absolute: 0 10*3/uL (ref 0.0–0.1)
Basophils Relative: 0 %
Eosinophils Absolute: 0 10*3/uL (ref 0.0–0.5)
Eosinophils Relative: 0 %
HCT: 39 % (ref 36.0–46.0)
Hemoglobin: 12.4 g/dL (ref 12.0–15.0)
Immature Granulocytes: 1 %
Lymphocytes Relative: 5 %
Lymphs Abs: 0.4 10*3/uL — ABNORMAL LOW (ref 0.7–4.0)
MCH: 32.1 pg (ref 26.0–34.0)
MCHC: 31.8 g/dL (ref 30.0–36.0)
MCV: 101 fL — ABNORMAL HIGH (ref 80.0–100.0)
Monocytes Absolute: 0.3 10*3/uL (ref 0.1–1.0)
Monocytes Relative: 4 %
Neutro Abs: 7.9 10*3/uL — ABNORMAL HIGH (ref 1.7–7.7)
Neutrophils Relative %: 90 %
Platelets: 175 10*3/uL (ref 150–400)
RBC: 3.86 MIL/uL — ABNORMAL LOW (ref 3.87–5.11)
RDW: 14.7 % (ref 11.5–15.5)
WBC: 8.6 10*3/uL (ref 4.0–10.5)
nRBC: 0 % (ref 0.0–0.2)

## 2023-10-11 LAB — PROCALCITONIN: Procalcitonin: 0.18 ng/mL

## 2023-10-11 LAB — MAGNESIUM: Magnesium: 1.9 mg/dL (ref 1.7–2.4)

## 2023-10-11 MED ORDER — ARFORMOTEROL TARTRATE 15 MCG/2ML IN NEBU
15.0000 ug | INHALATION_SOLUTION | Freq: Two times a day (BID) | RESPIRATORY_TRACT | Status: DC
  Administered 2023-10-11 – 2023-10-13 (×5): 15 ug via RESPIRATORY_TRACT
  Filled 2023-10-11 (×5): qty 2

## 2023-10-11 MED ORDER — IPRATROPIUM-ALBUTEROL 0.5-2.5 (3) MG/3ML IN SOLN: 3.0000 mL | Freq: Four times a day (QID) | RESPIRATORY_TRACT | Status: DC | PRN

## 2023-10-11 MED ORDER — METHYLPREDNISOLONE SODIUM SUCC 125 MG IJ SOLR
60.0000 mg | Freq: Every day | INTRAMUSCULAR | Status: DC
  Administered 2023-10-11 – 2023-10-13 (×3): 60 mg via INTRAVENOUS
  Filled 2023-10-11 (×3): qty 2

## 2023-10-11 MED ORDER — AZITHROMYCIN 250 MG PO TABS: 500.0000 mg | ORAL_TABLET | Freq: Every day | ORAL | Status: DC

## 2023-10-11 MED ORDER — BUDESONIDE 0.25 MG/2ML IN SUSP
0.2500 mg | Freq: Two times a day (BID) | RESPIRATORY_TRACT | Status: DC
  Administered 2023-10-11 – 2023-10-13 (×5): 0.25 mg via RESPIRATORY_TRACT
  Filled 2023-10-11 (×5): qty 2

## 2023-10-11 NOTE — Assessment & Plan Note (Signed)
-   Treated with Solu-Medrol, Pulmicort, Brovana with good response overall -Discharged on course of prednisone to complete

## 2023-10-11 NOTE — Progress Notes (Signed)
 Mobility Specialist - Progress Note   10/11/23 1246  Mobility  Activity Ambulated independently in hallway  Level of Assistance Independent  Assistive Device None  Distance Ambulated (ft) 250 ft  Activity Response Tolerated well  Mobility Referral Yes  Mobility visit 1 Mobility  Mobility Specialist Start Time (ACUTE ONLY) 1234  Mobility Specialist Stop Time (ACUTE ONLY) 1245  Mobility Specialist Time Calculation (min) (ACUTE ONLY) 11 min   Nurse requested Mobility Specialist to perform oxygen saturation test with pt which includes removing pt from oxygen both at rest and while ambulating.  Below are the results from that testing.     Patient Saturations on Room Air at Rest = spO2 87%  Patient Saturations on 4 Liters of oxygen while Ambulating = sp02 91%  At end of testing pt left in room on 4  Liters of oxygen.  Reported results to nurse. Pt received in bed and agreeable to mobility. Distance limited d/t SOB. No complaints during session. Pt to bed after session with all needs met.    Pre-mobility:  87-91%  SpO2 (RA/4L [place]) During mobility: 90% SpO2 (4L [place]) Post-mobility: 95%  SPO2 (4L [place])  Chief Technology Officer

## 2023-10-11 NOTE — Progress Notes (Signed)
 Progress Note    Monica Williamson   [id number]  DOB: 01/15/1941  DOA: 10/09/2023     1 PCP: Cleatis Polka., MD  Initial CC: Cough  Hospital Course: Monica Williamson is a 83 y.o. female with history of A-fib, hypertension, hyperlipidemia has been experiencing cough and congestion.  Patient also had 2-3 episodes of vomiting and felt very sick and weak.  She took some over-the-counter Delsym despite which her cough was persistent and went to her primary care physician where patient's x-ray showed pneumonia and also was hypoxic and was referred to the ER.  Denies any chest pain.  Denies any recent travel or sick contacts.   ED Course: In the ER patient was hypoxic requiring 3 L oxygen with chest x-ray showing bilateral infiltrates.  Cultures were sent and patient was started on empiric antibiotics.  Labs show BNP of 570 WBC of 11.8 sodium 133 creatinine 1.1 COVID and flu test were negative. She was continued on antibiotics and admitted for ongoing workup and monitoring.  Interval History:  Remains on oxygen but respiratory status stable.  Feels that she has some minor wheezing but none appreciated on exam.  Daughter updated on phone.  Patient does have a history of mild asthma. Adjusting nebulizer treatment and continuing to wean oxygen as able.   Assessment and Plan: * Acute respiratory failure with hypoxia (HCC) - Etiology now attributed to metapneumovirus - for now adding steroids and monitor response - wean as able  CAP (community acquired pneumonia) - Mild patchy bilateral airspace opacities -COVID, flu, RSV negative -RVP positive for metapneumovirus; PCT equivocal -Discontinue antibiotics - continue steroids given persistent hypoxia  Acute bronchitis due to human metapneumovirus - Continue steroids - Add Pulmicort and Brovana - DuoNebs as needed - Continue droplet precautions  Anxiety and depression Continue Wellbutrin and as needed Xanax  PAF  (paroxysmal atrial fibrillation) (HCC) - Continue Eliquis, metoprolol, Cardizem    Old records reviewed in assessment of this patient  Antimicrobials: Azithromycin 10/09/2023 >> 10/11/2023 Rocephin 10/09/2023 >> 10/11/2023  DVT prophylaxis:   apixaban (ELIQUIS) tablet 5 mg   Code Status:   Code Status: Full Code  Mobility Assessment (Last 72 Hours)     Mobility Assessment     Row Name 10/11/23 0720 10/10/23 2030 10/10/23 0720 10/09/23 2138     Does patient have an order for bedrest or is patient medically unstable No - Continue assessment No - Continue assessment No - Continue assessment No - Continue assessment    What is the highest level of mobility based on the progressive mobility assessment? Level 6 (Walks independently in room and hall) - Balance while walking in room without assist - Complete Level 6 (Walks independently in room and hall) - Balance while walking in room without assist - Complete Level 6 (Walks independently in room and hall) - Balance while walking in room without assist - Complete Level 6 (Walks independently in room and hall) - Balance while walking in room without assist - Complete             Barriers to discharge: None Disposition Plan: Home HH orders placed: N/A Status is: Inpatient  Objective: Blood pressure (!) 100/40, pulse 63, temperature (!) 97.5 F (36.4 C), resp. rate 18, height 5\' 6"  (1.676 m), weight 65.1 kg, SpO2 96%.  Examination:  Physical Exam Constitutional:      Appearance: Normal appearance.  HENT:     Head: Normocephalic and atraumatic.     Mouth/Throat:  Mouth: Mucous membranes are moist.  Eyes:     Extraocular Movements: Extraocular movements intact.  Cardiovascular:     Rate and Rhythm: Normal rate and regular rhythm.  Pulmonary:     Effort: Pulmonary effort is normal. No respiratory distress.     Breath sounds: Rhonchi (scattered) present. No wheezing.  Abdominal:     General: Bowel sounds are normal. There is  no distension.     Palpations: Abdomen is soft.     Tenderness: There is no abdominal tenderness.  Musculoskeletal:        General: Normal range of motion.     Cervical back: Normal range of motion and neck supple.  Skin:    General: Skin is warm and dry.  Neurological:     General: No focal deficit present.     Mental Status: She is alert.  Psychiatric:        Mood and Affect: Mood normal.      Consultants:    Procedures:    Data Reviewed: Results for orders placed or performed during the hospital encounter of 10/09/23 (from the past 24 hours)  Respiratory (~20 pathogens) panel by PCR     Status: Abnormal   Collection Time: 10/10/23  2:37 PM   Specimen: Nasopharyngeal Swab; Respiratory  Result Value Ref Range   Adenovirus NOT DETECTED NOT DETECTED   Coronavirus 229E NOT DETECTED NOT DETECTED   Coronavirus HKU1 NOT DETECTED NOT DETECTED   Coronavirus NL63 NOT DETECTED NOT DETECTED   Coronavirus OC43 NOT DETECTED NOT DETECTED   Metapneumovirus DETECTED (A) NOT DETECTED   Rhinovirus / Enterovirus NOT DETECTED NOT DETECTED   Influenza A NOT DETECTED NOT DETECTED   Influenza B NOT DETECTED NOT DETECTED   Parainfluenza Virus 1 NOT DETECTED NOT DETECTED   Parainfluenza Virus 2 NOT DETECTED NOT DETECTED   Parainfluenza Virus 3 NOT DETECTED NOT DETECTED   Parainfluenza Virus 4 NOT DETECTED NOT DETECTED   Respiratory Syncytial Virus NOT DETECTED NOT DETECTED   Bordetella pertussis NOT DETECTED NOT DETECTED   Bordetella Parapertussis NOT DETECTED NOT DETECTED   Chlamydophila pneumoniae NOT DETECTED NOT DETECTED   Mycoplasma pneumoniae NOT DETECTED NOT DETECTED  Procalcitonin     Status: None   Collection Time: 10/10/23  5:33 PM  Result Value Ref Range   Procalcitonin 0.29 ng/mL  Expectorated Sputum Assessment w Gram Stain, Rflx to Resp Cult     Status: None   Collection Time: 10/10/23  8:45 PM   Specimen: Expectorated Sputum  Result Value Ref Range   Specimen Description  EXPECTORATED SPUTUM    Special Requests NONE    Sputum evaluation      THIS SPECIMEN IS ACCEPTABLE FOR SPUTUM CULTURE Performed at [facility], [address], [place], [place] [address]    Report Status 10/10/2023 FINAL   Culture, Respiratory w Gram Stain     Status: None (Preliminary result)   Collection Time: 10/10/23  8:45 PM  Result Value Ref Range   Specimen Description      EXPECTORATED SPUTUM Performed at [facility], [address], [place], [place] [address]    Special Requests      NONE Reflexed from [id number] Performed at [facility], [address] [place], [place] [address]    Gram Stain      ABUNDANT WBC PRESENT,BOTH PMN AND MONONUCLEAR FEW GRAM POSITIVE COCCI    Culture      TOO YOUNG TO READ Performed at [facility], [address] [place], [place] [address]    Report Status PENDING   Glucose, capillary     Status: Abnormal   Collection Time: 10/10/23  9:18 PM  Result Value Ref Range   Glucose-Capillary 184 (H) 70 - 99 mg/dL  CBC with Differential/Platelet     Status: Abnormal   Collection Time: 10/11/23  5:41 AM  Result Value Ref Range   WBC 8.6 4.0 - 10.5 K/uL   RBC 3.86 (L) 3.87 - 5.11 MIL/uL   Hemoglobin 12.4 12.0 - 15.0 g/dL   HCT 98.1 19.1 - 47.8 %   MCV 101.0 (H) 80.0 - 100.0 fL   MCH 32.1 26.0 - 34.0 pg   MCHC 31.8 30.0 - 36.0 g/dL   RDW 29.5 62.1 - 30.8 %   Platelets 175 150 - 400 K/uL   nRBC 0.0 0.0 - 0.2 %   Neutrophils Relative % 90 %   Neutro Abs 7.9 (H) 1.7 - 7.7 K/uL   Lymphocytes Relative 5 %   Lymphs Abs 0.4 (L) 0.7 - 4.0 K/uL   Monocytes Relative 4 %   Monocytes Absolute 0.3 0.1 - 1.0 K/uL   Eosinophils Relative 0 %   Eosinophils Absolute 0.0 0.0 - 0.5 K/uL   Basophils Relative 0 %   Basophils Absolute 0.0 0.0 - 0.1 K/uL   Immature Granulocytes 1 %   Abs Immature Granulocytes 0.05 0.00 - 0.07 K/uL  Basic metabolic panel     Status: Abnormal   Collection Time:  10/11/23  5:41 AM  Result Value Ref Range   Sodium 132 (L) 135 - 145 mmol/L   Potassium 3.6 3.5 - 5.1 mmol/L   Chloride 96 (L) 98 - 111 mmol/L   CO2 27 22 - 32 mmol/L   Glucose, Bld 153 (H) 70 - 99 mg/dL   BUN 15 8 - 23 mg/dL   Creatinine, Ser 6.57 0.44 - 1.00 mg/dL   Calcium 7.9 (L) 8.9 - 10.3 mg/dL   GFR, Estimated >84 >69 mL/min   Anion gap 9 5 - 15  Magnesium     Status: None   Collection Time: 10/11/23  5:41 AM  Result Value Ref Range   Magnesium 1.9 1.7 - 2.4 mg/dL  Procalcitonin     Status: None   Collection Time: 10/11/23  8:30 AM  Result Value Ref Range   Procalcitonin 0.18 ng/mL    I have reviewed pertinent nursing notes, vitals, labs, and images as necessary. I have ordered labwork to follow up on as indicated.  I have reviewed the last notes from staff over past 24 hours. I have discussed patient's care plan and test results with nursing staff, CM/SW, and other staff as appropriate.  Time spent: Greater than 50% of the 55 minute visit was spent in counseling/coordination of care for the patient as laid out in the A&P.   LOS: 1 day   Lewie Chamber, MD [facility] 10/11/2023, 1:40 PM

## 2023-10-11 NOTE — Progress Notes (Signed)
   10/11/23 1603  TOC Brief Assessment  Insurance and Status Reviewed  Patient has primary care physician Yes  Home environment has been reviewed Home s/ spouse  Prior level of function: Independent  Prior/Current Home Services No current home services  Social Drivers of Health Review SDOH reviewed no interventions necessary  Readmission risk has been reviewed Yes  Transition of care needs transition of care needs identified, TOC will continue to follow   TOC following for O2 need.

## 2023-10-12 DIAGNOSIS — J9601 Acute respiratory failure with hypoxia: Secondary | ICD-10-CM | POA: Diagnosis not present

## 2023-10-12 DIAGNOSIS — J189 Pneumonia, unspecified organism: Secondary | ICD-10-CM | POA: Diagnosis not present

## 2023-10-12 DIAGNOSIS — B9781 Human metapneumovirus as the cause of diseases classified elsewhere: Secondary | ICD-10-CM | POA: Diagnosis not present

## 2023-10-12 DIAGNOSIS — J208 Acute bronchitis due to other specified organisms: Secondary | ICD-10-CM | POA: Diagnosis not present

## 2023-10-12 LAB — BASIC METABOLIC PANEL WITH GFR
Anion gap: 9 (ref 5–15)
BUN: 23 mg/dL (ref 8–23)
CO2: 27 mmol/L (ref 22–32)
Calcium: 8.3 mg/dL — ABNORMAL LOW (ref 8.9–10.3)
Chloride: 98 mmol/L (ref 98–111)
Creatinine, Ser: 0.72 mg/dL (ref 0.44–1.00)
GFR, Estimated: >60 mL/min (ref >60)
Glucose, Bld: 156 mg/dL — ABNORMAL HIGH (ref 70–99)
Potassium: 3.6 mmol/L (ref 3.5–5.1)
Sodium: 134 mmol/L — ABNORMAL LOW (ref 135–145)

## 2023-10-12 LAB — CBC WITH DIFFERENTIAL/PLATELET
Abs Immature Granulocytes: 0.08 10*3/uL — ABNORMAL HIGH (ref 0.00–0.07)
Basophils Absolute: 0 10*3/uL (ref 0.0–0.1)
Basophils Relative: 0 %
Eosinophils Absolute: 0 10*3/uL (ref 0.0–0.5)
Eosinophils Relative: 0 %
HCT: 38.6 % (ref 36.0–46.0)
Hemoglobin: 12.6 g/dL (ref 12.0–15.0)
Immature Granulocytes: 1 %
Lymphocytes Relative: 5 %
Lymphs Abs: 0.5 10*3/uL — ABNORMAL LOW (ref 0.7–4.0)
MCH: 32.2 pg (ref 26.0–34.0)
MCHC: 32.6 g/dL (ref 30.0–36.0)
MCV: 98.7 fL (ref 80.0–100.0)
Monocytes Absolute: 0.5 10*3/uL (ref 0.1–1.0)
Monocytes Relative: 5 %
Neutro Abs: 7.9 10*3/uL — ABNORMAL HIGH (ref 1.7–7.7)
Neutrophils Relative %: 89 %
Platelets: 215 10*3/uL (ref 150–400)
RBC: 3.91 MIL/uL (ref 3.87–5.11)
RDW: 14.4 % (ref 11.5–15.5)
WBC: 8.9 10*3/uL (ref 4.0–10.5)
nRBC: 0 % (ref 0.0–0.2)

## 2023-10-12 LAB — MAGNESIUM: Magnesium: 2.1 mg/dL (ref 1.7–2.4)

## 2023-10-12 NOTE — Plan of Care (Signed)

## 2023-10-12 NOTE — Progress Notes (Signed)
 SATURATION QUALIFICATIONS: (This note is used to comply with regulatory documentation for home oxygen)  Patient Saturations on Room Air at Rest = 93%  Patient Saturations on Room Air while Ambulating = 89%  Patient Saturations on 1 Liters of oxygen while Ambulating = 92%  Please briefly explain why patient needs home oxygen:

## 2023-10-12 NOTE — Progress Notes (Signed)
 Mobility Specialist - Progress Note   10/12/23 1205  Oxygen Therapy  O2 Device Nasal Cannula  O2 Flow Rate (L/min) 1 L/min  Mobility  Activity Ambulated independently in hallway  Level of Assistance Independent  Assistive Device None  Distance Ambulated (ft) 275 ft  Activity Response Tolerated well  Mobility Referral Yes  Mobility visit 1 Mobility  Mobility Specialist Start Time (ACUTE ONLY) 1158  Mobility Specialist Stop Time (ACUTE ONLY) 1204  Mobility Specialist Time Calculation (min) (ACUTE ONLY) 6 min   Pt received in bed and agreeable to mobility. Ambulated pt on 1L SpO2 d/t patient stating that's what she ambulated with earlier.  No complaints during session. Pt to bed after session with all needs met.   Pre-mobility: 92% SpO2 (1L [place]) During mobility: 91% SpO2 (1L [place]) Post-mobility: 91% SPO2 (1L [place])  Chief Technology Officer

## 2023-10-12 NOTE — Progress Notes (Signed)
 Progress Note    Monica Williamson   WUJ:[id number]  DOB: [birth year]  DOA: 10/09/2023     2 PCP: Cleatis Polka., MD  Initial CC: Cough  Hospital Course: Monica Williamson is a 83 y.o. female with history of A-fib, hypertension, hyperlipidemia has been experiencing cough and congestion.  Patient also had 2-3 episodes of vomiting and felt very sick and weak.  She took some over-the-counter Delsym despite which her cough was persistent and went to her primary care physician where patient's x-ray showed pneumonia and also was hypoxic and was referred to the ER.  Denies any chest pain.  Denies any recent travel or sick contacts.   ED Course: In the ER patient was hypoxic requiring 3 L oxygen with chest x-ray showing bilateral infiltrates.  Cultures were sent and patient was started on empiric antibiotics.  Labs show BNP of 570 WBC of 11.8 sodium 133 creatinine 1.1 COVID and flu test were negative. She was continued on antibiotics and admitted for ongoing workup and monitoring.  Interval History:  No events overnight.  Breathing a little more comfortable each day.  Ambulated with lowest saturation 89% on room air this morning. We are tentatively aiming for discharge tomorrow if still improving.  Assessment and Plan: * Acute respiratory failure with hypoxia (HCC) - Etiology now attributed to metapneumovirus - for now adding steroids and monitor response - wean as able - ambulated 3/27 on RA with lowest 89% - perform walk test again on 3/28  CAP (community acquired pneumonia) - Mild patchy bilateral airspace opacities -COVID, flu, RSV negative -RVP positive for metapneumovirus; PCT equivocal -Discontinue antibiotics - continue steroids given persistent hypoxia  Acute bronchitis due to human metapneumovirus - Continue steroids - Add Pulmicort and Brovana - DuoNebs as needed - Continue droplet precautions  Anxiety and depression Continue Wellbutrin and as needed  Xanax  PAF (paroxysmal atrial fibrillation) (HCC) - Continue Eliquis, metoprolol, Cardizem    Old records reviewed in assessment of this patient  Antimicrobials: Azithromycin 10/09/2023 >> 10/11/2023 Rocephin 10/09/2023 >> 10/11/2023  DVT prophylaxis:   apixaban (ELIQUIS) tablet 5 mg   Code Status:   Code Status: Full Code  Mobility Assessment (Last 72 Hours)     Mobility Assessment     Row Name 10/11/23 2000 10/11/23 0720 10/10/23 2030 10/10/23 0720 10/09/23 2138   Does patient have an order for bedrest or is patient medically unstable No - Continue assessment No - Continue assessment No - Continue assessment No - Continue assessment No - Continue assessment   What is the highest level of mobility based on the progressive mobility assessment? Level 6 (Walks independently in room and hall) - Balance while walking in room without assist - Complete Level 6 (Walks independently in room and hall) - Balance while walking in room without assist - Complete Level 6 (Walks independently in room and hall) - Balance while walking in room without assist - Complete Level 6 (Walks independently in room and hall) - Balance while walking in room without assist - Complete Level 6 (Walks independently in room and hall) - Balance while walking in room without assist - Complete            Barriers to discharge: None Disposition Plan: Home HH orders placed: N/A Status is: Inpatient  Objective: Blood pressure 116/72, pulse 72, temperature 98 F (36.7 C), temperature source Oral, resp. rate 17, height 5\' 6"  (1.676 m), weight 65.1 kg, SpO2 93%.  Examination:  Physical Exam Constitutional:  Appearance: Normal appearance.  HENT:     Head: Normocephalic and atraumatic.     Mouth/Throat:     Mouth: Mucous membranes are moist.  Eyes:     Extraocular Movements: Extraocular movements intact.  Cardiovascular:     Rate and Rhythm: Normal rate and regular rhythm.  Pulmonary:     Effort: Pulmonary  effort is normal. No respiratory distress.     Breath sounds: Rhonchi (scattered) present. No wheezing.  Abdominal:     General: Bowel sounds are normal. There is no distension.     Palpations: Abdomen is soft.     Tenderness: There is no abdominal tenderness.  Musculoskeletal:        General: Normal range of motion.     Cervical back: Normal range of motion and neck supple.  Skin:    General: Skin is warm and dry.  Neurological:     General: No focal deficit present.     Mental Status: She is alert.  Psychiatric:        Mood and Affect: Mood normal.      Consultants:    Procedures:    Data Reviewed: Results for orders placed or performed during the hospital encounter of 10/09/23 (from the past 24 hours)  CBC with Differential/Platelet     Status: Abnormal   Collection Time: 10/12/23  5:31 AM  Result Value Ref Range   WBC 8.9 4.0 - 10.5 K/uL   RBC 3.91 3.87 - 5.11 MIL/uL   Hemoglobin 12.6 12.0 - 15.0 g/dL   HCT 40.9 81.1 - 91.4 %   MCV 98.7 80.0 - 100.0 fL   MCH 32.2 26.0 - 34.0 pg   MCHC 32.6 30.0 - 36.0 g/dL   RDW 78.2 95.6 - 21.3 %   Platelets 215 150 - 400 K/uL   nRBC 0.0 0.0 - 0.2 %   Neutrophils Relative % 89 %   Neutro Abs 7.9 (H) 1.7 - 7.7 K/uL   Lymphocytes Relative 5 %   Lymphs Abs 0.5 (L) 0.7 - 4.0 K/uL   Monocytes Relative 5 %   Monocytes Absolute 0.5 0.1 - 1.0 K/uL   Eosinophils Relative 0 %   Eosinophils Absolute 0.0 0.0 - 0.5 K/uL   Basophils Relative 0 %   Basophils Absolute 0.0 0.0 - 0.1 K/uL   Immature Granulocytes 1 %   Abs Immature Granulocytes 0.08 (H) 0.00 - 0.07 K/uL  Basic metabolic panel     Status: Abnormal   Collection Time: 10/12/23  5:31 AM  Result Value Ref Range   Sodium 134 (L) 135 - 145 mmol/L   Potassium 3.6 3.5 - 5.1 mmol/L   Chloride 98 98 - 111 mmol/L   CO2 27 22 - 32 mmol/L   Glucose, Bld 156 (H) 70 - 99 mg/dL   BUN 23 8 - 23 mg/dL   Creatinine, Ser 0.86 0.44 - 1.00 mg/dL   Calcium 8.3 (L) 8.9 - 10.3 mg/dL   GFR,  Estimated >57 >84 mL/min   Anion gap 9 5 - 15  Magnesium     Status: None   Collection Time: 10/12/23  5:31 AM  Result Value Ref Range   Magnesium 2.1 1.7 - 2.4 mg/dL    I have reviewed pertinent nursing notes, vitals, labs, and images as necessary. I have ordered labwork to follow up on as indicated.  I have reviewed the last notes from staff over past 24 hours. I have discussed patient's care plan and test results with nursing staff,  CM/SW, and other staff as appropriate.  Time spent: Greater than 50% of the 55 minute visit was spent in counseling/coordination of care for the patient as laid out in the A&P.   LOS: 2 days   Lewie Chamber, MD [facility] 10/12/2023, 12:59 PM

## 2023-10-12 NOTE — Plan of Care (Signed)
  Problem: Pain Managment: Goal: General experience of comfort will improve and/or be controlled Outcome: Progressing   Problem: Safety: Goal: Ability to remain free from injury will improve Outcome: Progressing   Problem: Skin Integrity: Goal: Risk for impaired skin integrity will decrease Outcome: Progressing   Problem: Activity: Goal: Ability to tolerate increased activity will improve Outcome: Progressing   Problem: Clinical Measurements: Goal: Ability to maintain a body temperature in the normal range will improve Outcome: Progressing   Problem: Respiratory: Goal: Ability to maintain adequate ventilation will improve Outcome: Progressing Goal: Ability to maintain a clear airway will improve Outcome: Progressing

## 2023-10-12 NOTE — Plan of Care (Signed)
  Problem: Clinical Measurements: Goal: Diagnostic test results will improve Outcome: Progressing   Problem: Activity: Goal: Risk for activity intolerance will decrease Outcome: Progressing   Problem: Nutrition: Goal: Adequate nutrition will be maintained Outcome: Progressing   Problem: Pain Managment: Goal: General experience of comfort will improve and/or be controlled Outcome: Progressing   Problem: Safety: Goal: Ability to remain free from injury will improve Outcome: Progressing

## 2023-10-13 ENCOUNTER — Other Ambulatory Visit (HOSPITAL_COMMUNITY): Payer: Self-pay

## 2023-10-13 DIAGNOSIS — J9601 Acute respiratory failure with hypoxia: Secondary | ICD-10-CM | POA: Diagnosis not present

## 2023-10-13 DIAGNOSIS — B9781 Human metapneumovirus as the cause of diseases classified elsewhere: Secondary | ICD-10-CM | POA: Diagnosis not present

## 2023-10-13 DIAGNOSIS — J208 Acute bronchitis due to other specified organisms: Secondary | ICD-10-CM | POA: Diagnosis not present

## 2023-10-13 DIAGNOSIS — J189 Pneumonia, unspecified organism: Secondary | ICD-10-CM | POA: Diagnosis not present

## 2023-10-13 LAB — CULTURE, RESPIRATORY W GRAM STAIN

## 2023-10-13 MED ORDER — PREDNISONE 20 MG PO TABS
40.0000 mg | ORAL_TABLET | Freq: Every day | ORAL | 0 refills | Status: AC
  Filled 2023-10-13: qty 10, 5d supply, fill #0

## 2023-10-13 NOTE — Progress Notes (Signed)
 Mobility Specialist - Progress Note   10/13/23 0956  Oxygen Therapy  SpO2 96 %  O2 Device Nasal Cannula  O2 Flow Rate (L/min) 1 L/min  Patient Activity (if Appropriate) Ambulating  Mobility  Activity Ambulated independently in hallway  Level of Assistance Independent  Assistive Device None  Distance Ambulated (ft) 275 ft  Activity Response Tolerated well  Mobility Referral Yes  Mobility visit 1 Mobility  Mobility Specialist Start Time (ACUTE ONLY) 0941  Mobility Specialist Stop Time (ACUTE ONLY) 0954  Mobility Specialist Time Calculation (min) (ACUTE ONLY) 13 min   Pt received in bed and agreeable to mobility. No complaints during session. Pt to bed after session with all needs met.    Pre-mobility: 96% SpO2 (1L [place]) During mobility: 96% SpO2 (1L [place]) Post-mobility: 98% SPO2 (1L [place])  Chief Technology Officer

## 2023-10-13 NOTE — Discharge Summary (Signed)
 Physician Discharge Summary   Monica Williamson UJW:[id number] DOB: Dec 10, 1940 DOA: 10/09/2023  PCP: Cleatis Polka., MD  Admit date: 10/09/2023 Discharge date: 10/13/2023   Admitted From: Home Disposition:  Home Discharging physician: Lewie Chamber, MD Barriers to discharge: none  Recommendations at discharge: Continue routine chronic medical management   Discharge Condition: stable CODE STATUS: Full  Diet recommendation:  Diet Orders (From admission, onward)     Start     Ordered   10/13/23 0000  Diet general        10/13/23 1007   10/09/23 2212  Diet Heart Room service appropriate? Yes; Fluid consistency: Thin  Diet effective now       Question Answer Comment  Room service appropriate? Yes   Fluid consistency: Thin      10/09/23 2212            Hospital Course: Monica Williamson is a 83 y.o. female with history of A-fib, hypertension, hyperlipidemia has been experiencing cough and congestion.  Patient also had 2-3 episodes of vomiting and felt very sick and weak.  She took some over-the-counter Delsym despite which her cough was persistent and went to her primary care physician where patient's x-ray showed pneumonia and also was hypoxic and was referred to the ER.  Denies any chest pain.  Denies any recent travel or sick contacts.   ED Course: In the ER patient was hypoxic requiring 3 L oxygen with chest x-ray showing bilateral infiltrates.  Cultures were sent and patient was started on empiric antibiotics.  Labs show BNP of 570 WBC of 11.8 sodium 133 creatinine 1.1 COVID and flu test were negative. She was continued on antibiotics and admitted for ongoing workup and monitoring. She was treated with nebulizers and steroids after RPP returned positive with metapneumovirus.  She responded well clinically and was able to be weaned off of oxygen prior to discharge.  Antibiotics were discontinued after RVP returned positive.  Assessment and Plan: * Acute respiratory  failure with hypoxia (HCC)-resolved as of 10/13/2023 - Etiology now attributed to metapneumovirus -Low suspicion for other etiology such as PE given Eliquis use with good compliance - for now adding steroids and monitor response - ambulated 3/27 on RA with lowest 89% -Ambulated again on 10/13/2023.  She has adequate ambulating oxygen saturations with no need for home oxygen.  Some desats noted at rest but question validity of pleth - course of prednisone continued at discharge; she has albuterol inhaler already at home also and also a pulse ox meter  CAP (community acquired pneumonia)-resolved as of 10/13/2023 - Mild patchy bilateral airspace opacities -COVID, flu, RSV negative -RVP positive for metapneumovirus; PCT equivocal -Discontinue antibiotics - continue steroids given persistent hypoxia  Acute bronchitis due to human metapneumovirus - Treated with Solu-Medrol, Pulmicort, Brovana with good response overall -Discharged on course of prednisone to complete  Anxiety and depression Continue Wellbutrin and as needed Xanax  PAF (paroxysmal atrial fibrillation) (HCC) - Continue Eliquis, metoprolol, Cardizem   The patient's acute and chronic medical conditions were treated accordingly. On day of discharge, patient was felt deemed stable for discharge. Patient/family member advised to call PCP or come back to ER if needed.   Principal Diagnosis: Acute respiratory failure with hypoxia [facility])  Discharge Diagnoses: Active Hospital Problems   Diagnosis Date Noted   Acute bronchitis due to human metapneumovirus 10/11/2023    Priority: 2.   Anxiety and depression 10/10/2023   PAF (paroxysmal atrial fibrillation) (HCC) 09/25/2012   BRONCHIECTASIS 08/13/2007  Resolved Hospital Problems   Diagnosis Date Noted Date Resolved   Acute respiratory failure with hypoxia (HCC) 10/09/2023 10/13/2023    Priority: 2.   CAP (community acquired pneumonia) 10/09/2023 10/13/2023    Priority: 1.      Discharge Instructions     Diet general   Complete by: As directed    Increase activity slowly   Complete by: As directed    No wound care   Complete by: As directed       Allergies as of 10/13/2023       Reactions   Warfarin And Related Hives, Itching   Amoxicillin Itching   Augmentin [amoxicillin-pot Clavulanate] Rash   Avelox [moxifloxacin] Diarrhea        Medication List     TAKE these medications    acetaminophen 500 MG tablet Commonly known as: TYLENOL Take 1,000 mg by mouth every 6 (six) hours as needed for moderate pain or headache.   albuterol 108 (90 Base) MCG/ACT inhaler Commonly known as: VENTOLIN HFA Inhale 2 puffs into the lungs 2 (two) times daily as needed for wheezing or shortness of breath.   ALPRAZolam 0.5 MG tablet Commonly known as: XANAX Take 0.25 mg by mouth at bedtime as needed for anxiety or sleep.   apixaban 5 MG Tabs tablet Commonly known as: ELIQUIS Take 1 tablet (5 mg total) by mouth 2 (two) times daily. What changed: Another medication with the same name was removed. Continue taking this medication, and follow the directions you see here.   atorvastatin 40 MG tablet Commonly known as: LIPITOR Take 40 mg by mouth daily at 6 PM.   Baclofen 5 MG Tabs 1 tablet as needed Orally TID as needed for 30 days   buPROPion 150 MG 24 hr tablet Commonly known as: WELLBUTRIN XL Take 150 mg by mouth every morning.   cetirizine 10 MG tablet Commonly known as: ZYRTEC Take 10 mg by mouth daily.   cyclobenzaprine 10 MG tablet Commonly known as: FLEXERIL Take 10 mg by mouth daily as needed for muscle spasms.   diltiazem 180 MG 24 hr capsule Commonly known as: Dilt-XR Take 1 capsule (180 mg total) by mouth daily.   ezetimibe 10 MG tablet Commonly known as: ZETIA Take 10 mg by mouth daily.   fluticasone 110 MCG/ACT inhaler Commonly known as: FLOVENT HFA Inhale 2 puffs into the lungs 2 (two) times daily as needed (for shortness of  breath).   fluticasone 50 MCG/ACT nasal spray Commonly known as: FLONASE Place 2 sprays into both nostrils daily. What changed:  when to take this reasons to take this   L-Lysine 1000 MG Tabs Take 1 tablet by mouth as needed (cold sores).   metoprolol tartrate 50 MG tablet Commonly known as: LOPRESSOR Take 1 tablet (50 mg total) by mouth 2 (two) times daily.   ondansetron 4 MG tablet Commonly known as: ZOFRAN Take 4 mg by mouth every 6 (six) hours as needed for nausea or vomiting.   pantoprazole 40 MG tablet Commonly known as: PROTONIX Take 40 mg by mouth daily.   predniSONE 20 MG tablet Commonly known as: DELTASONE Take 2 tablets (40 mg total) by mouth daily with breakfast for 5 days.   Prolia 60 MG/ML Sosy injection Generic drug: denosumab Inject 60 mg into the skin every 6 (six) months.   psyllium 58.6 % powder Commonly known as: METAMUCIL Take 1 packet by mouth daily.   Qvar RediHaler 40 MCG/ACT inhaler Generic drug: beclomethasone Inhale 2 puffs  into the lungs 2 (two) times daily as needed (sob).   VITAMIN C PO Take 1 tablet by mouth daily.   Vitamin D-1000 Max St 25 MCG (1000 UT) tablet Generic drug: Cholecalciferol Take 1,000 Units by mouth daily.   Zinc 50 MG Tabs 1 tablet Orally Once a day        Allergies  Allergen Reactions   Warfarin And Related Hives and Itching   Amoxicillin Itching   Augmentin [Amoxicillin-Pot Clavulanate] Rash   Avelox [Moxifloxacin] Diarrhea    Consultations:   Procedures:   Discharge Exam: BP 133/69 (BP Location: Left Arm)   Pulse 74   Temp 97.6 F (36.4 C) (Oral)   Resp 18   Ht 5\' 6"  (1.676 m)   Wt 65.1 kg   SpO2 96%   BMI 23.16 kg/m  Physical Exam Constitutional:      Appearance: Normal appearance.  HENT:     Head: Normocephalic and atraumatic.     Mouth/Throat:     Mouth: Mucous membranes are moist.  Eyes:     Extraocular Movements: Extraocular movements intact.  Cardiovascular:     Rate and  Rhythm: Normal rate and regular rhythm.  Pulmonary:     Effort: Pulmonary effort is normal. No respiratory distress.     Breath sounds: Rhonchi (scattered (overall improved)) present. No wheezing.  Abdominal:     General: Bowel sounds are normal. There is no distension.     Palpations: Abdomen is soft.     Tenderness: There is no abdominal tenderness.  Musculoskeletal:        General: Normal range of motion.     Cervical back: Normal range of motion and neck supple.  Skin:    General: Skin is warm and dry.  Neurological:     General: No focal deficit present.     Mental Status: She is alert.  Psychiatric:        Mood and Affect: Mood normal.      The results of significant diagnostics from this hospitalization (including imaging, microbiology, ancillary and laboratory) are listed below for reference.   Microbiology: Recent Results (from the past 240 hours)  Blood culture (routine x 2)     Status: None (Preliminary result)   Collection Time: 10/09/23  5:40 PM   Specimen: BLOOD  Result Value Ref Range Status   Specimen Description   Final    BLOOD SITE NOT SPECIFIED Performed at [facility], [address] [place], [place] [address]    Special Requests   Final    BOTTLES DRAWN AEROBIC AND ANAEROBIC Blood Culture results may not be optimal due to an inadequate volume of blood received in culture bottles Performed at [facility], [address] [place], [place] [address]    Culture   Final    NO GROWTH 4 DAYS Performed at [facility], [address] [place], [place] [address]    Report Status PENDING  Incomplete  Blood culture (routine x 2)     Status: None (Preliminary result)   Collection Time: 10/09/23  5:56 PM   Specimen: Site Not Specified; Blood  Result Value Ref Range Status   Specimen Description   Final    SITE NOT SPECIFIED Performed at [facility], [address], [place], [place] [address]    Special  Requests   Final    BOTTLES DRAWN AEROBIC AND ANAEROBIC Blood Culture results may not be optimal due to an inadequate volume  of blood received in culture bottles Performed at [facility], [address], [place], [place] [address]    Culture   Final    NO GROWTH 4 DAYS Performed at [facility], [address] [place], [place] [address]    Report Status PENDING  Incomplete  Resp panel by RT-PCR (RSV, Flu A&B, Covid) Anterior Nasal Swab     Status: None   Collection Time: 10/09/23  7:51 PM   Specimen: Anterior Nasal Swab  Result Value Ref Range Status   SARS Coronavirus 2 by RT PCR NEGATIVE NEGATIVE Final    Comment: (NOTE) SARS-CoV-2 target nucleic acids are NOT DETECTED.  The SARS-CoV-2 RNA is generally detectable in upper respiratory specimens during the acute phase of infection. The lowest concentration of SARS-CoV-2 viral copies this assay can detect is 138 copies/mL. A negative result does not preclude SARS-Cov-2 infection and should not be used as the sole basis for treatment or other patient management decisions. A negative result may occur with  improper specimen collection/handling, submission of specimen other than nasopharyngeal swab, presence of viral mutation(s) within the areas targeted by this assay, and inadequate number of viral copies(<138 copies/mL). A negative result must be combined with clinical observations, patient history, and epidemiological information. The expected result is Negative.  Fact Sheet for Patients:  BloggerCourse.com  Fact Sheet for Healthcare Providers:  SeriousBroker.it  This test is no t yet approved or cleared by the [place] FDA and  has been authorized for detection and/or diagnosis of SARS-CoV-2 by FDA under an Emergency Use Authorization (EUA). This EUA will remain  in effect (meaning this test can be used) for the duration of the COVID-19  declaration under Section 564(b)(1) of the Act, 21 U.S.C.section 360bbb-3(b)(1), unless the authorization is terminated  or revoked sooner.       Influenza A by PCR NEGATIVE NEGATIVE Final   Influenza B by PCR NEGATIVE NEGATIVE Final    Comment: (NOTE) The Xpert Xpress SARS-CoV-2/FLU/RSV plus assay is intended as an aid in the diagnosis of influenza from Nasopharyngeal swab specimens and should not be used as a sole basis for treatment. Nasal washings and aspirates are unacceptable for Xpert Xpress SARS-CoV-2/FLU/RSV testing.  Fact Sheet for Patients: BloggerCourse.com  Fact Sheet for Healthcare Providers: SeriousBroker.it  This test is not yet approved or cleared by the [place] FDA and has been authorized for detection and/or diagnosis of SARS-CoV-2 by FDA under an Emergency Use Authorization (EUA). This EUA will remain in effect (meaning this test can be used) for the duration of the COVID-19 declaration under Section 564(b)(1) of the Act, 21 U.S.C. section 360bbb-3(b)(1), unless the authorization is terminated or revoked.     Resp Syncytial Virus by PCR NEGATIVE NEGATIVE Final    Comment: (NOTE) Fact Sheet for Patients: BloggerCourse.com  Fact Sheet for Healthcare Providers: SeriousBroker.it  This test is not yet approved or cleared by the [place] FDA and has been authorized for detection and/or diagnosis of SARS-CoV-2 by FDA under an Emergency Use Authorization (EUA). This EUA will remain in effect (meaning this test can be used) for the duration of the COVID-19 declaration under Section 564(b)(1) of the Act, 21 U.S.C. section 360bbb-3(b)(1), unless the authorization is terminated or revoked.  Performed at [facility], [address] [place], [place] [address]   Respiratory (~20 pathogens) panel by PCR     Status: Abnormal    Collection Time: 10/10/23  2:37 PM   Specimen: Nasopharyngeal  Swab; Respiratory  Result Value Ref Range Status   Adenovirus NOT DETECTED NOT DETECTED Final   Coronavirus 229E NOT DETECTED NOT DETECTED Final    Comment: (NOTE) The Coronavirus on the Respiratory Panel, DOES NOT test for the novel  Coronavirus (2019 nCoV)    Coronavirus HKU1 NOT DETECTED NOT DETECTED Final   Coronavirus NL63 NOT DETECTED NOT DETECTED Final   Coronavirus OC43 NOT DETECTED NOT DETECTED Final   Metapneumovirus DETECTED (A) NOT DETECTED Final   Rhinovirus / Enterovirus NOT DETECTED NOT DETECTED Final   Influenza A NOT DETECTED NOT DETECTED Final   Influenza B NOT DETECTED NOT DETECTED Final   Parainfluenza Virus 1 NOT DETECTED NOT DETECTED Final   Parainfluenza Virus 2 NOT DETECTED NOT DETECTED Final   Parainfluenza Virus 3 NOT DETECTED NOT DETECTED Final   Parainfluenza Virus 4 NOT DETECTED NOT DETECTED Final   Respiratory Syncytial Virus NOT DETECTED NOT DETECTED Final   Bordetella pertussis NOT DETECTED NOT DETECTED Final   Bordetella Parapertussis NOT DETECTED NOT DETECTED Final   Chlamydophila pneumoniae NOT DETECTED NOT DETECTED Final   Mycoplasma pneumoniae NOT DETECTED NOT DETECTED Final    Comment: Performed at [facility], [address], [place], [place] [address]  Expectorated Sputum Assessment w Gram Stain, Rflx to Resp Cult     Status: None   Collection Time: 10/10/23  8:45 PM   Specimen: Expectorated Sputum  Result Value Ref Range Status   Specimen Description EXPECTORATED SPUTUM  Final   Special Requests NONE  Final   Sputum evaluation   Final    THIS SPECIMEN IS ACCEPTABLE FOR SPUTUM CULTURE Performed at [facility], [address], [place], [place] [address]    Report Status 10/10/2023 FINAL  Final  Culture, Respiratory w Gram Stain     Status: None   Collection Time: 10/10/23  8:45 PM  Result Value Ref Range Status   Specimen Description   Final     EXPECTORATED SPUTUM Performed at [facility], [address] [place], [place] [address]    Special Requests   Final    NONE Reflexed from [id number] Performed at [facility], [address] [place], [place] [address]    Gram Stain   Final    ABUNDANT WBC PRESENT,BOTH PMN AND MONONUCLEAR FEW GRAM POSITIVE COCCI    Culture   Final    FEW Normal respiratory flora-no Staph aureus or Pseudomonas seen Performed at [facility], [address] [place], [place] [address]    Report Status 10/13/2023 FINAL  Final     Labs: BNP (last 3 results) Recent Labs    10/09/23 1728  BNP 570.5*   Basic Metabolic Panel: Recent Labs  Lab 10/09/23 1728 10/10/23 0552 10/11/23 0541 10/12/23 0531  NA 133* 131* 132* 134*  K 3.9 4.0 3.6 3.6  CL 96* 97* 96* 98  CO2 26 30 27 27   GLUCOSE 122* 116* 153* 156*  BUN 20 14 15 23   CREATININE 1.12* 0.86 0.71 0.72  CALCIUM 9.0 8.5* 7.9* 8.3*  MG  --   --  1.9 2.1   Liver Function Tests: Recent Labs  Lab 10/09/23 1728  AST 47*  ALT 39  ALKPHOS 59  BILITOT 0.8  PROT 6.6  ALBUMIN 3.5   No results for input(s): "LIPASE", "AMYLASE" in the last 168 hours. No results for input(s): "AMMONIA" in the last 168 hours. CBC: Recent Labs  Lab 10/09/23 1728 10/10/23 9629  10/11/23 0541 10/12/23 0531  WBC 11.8* 9.6 8.6 8.9  NEUTROABS 10.2*  --  7.9* 7.9*  HGB 13.5 12.5 12.4 12.6  HCT 42.0 39.2 39.0 38.6  MCV 98.8 100.5* 101.0* 98.7  PLT 232 179 175 215   Cardiac Enzymes: No results for input(s): "CKTOTAL", "CKMB", "CKMBINDEX", "TROPONINI" in the last 168 hours. BNP: Invalid input(s): "POCBNP" CBG: Recent Labs  Lab 10/10/23 2118  GLUCAP 184*   D-Dimer No results for input(s): "DDIMER" in the last 72 hours. Hgb A1c No results for input(s): "HGBA1C" in the last 72 hours. Lipid Profile No results for input(s): "CHOL", "HDL", "LDLCALC", "TRIG", "CHOLHDL", "LDLDIRECT" in the last 72 hours. Thyroid  function studies No results for input(s): "TSH", "T4TOTAL", "T3FREE", "THYROIDAB" in the last 72 hours.  Invalid input(s): "FREET3" Anemia work up No results for input(s): "VITAMINB12", "FOLATE", "FERRITIN", "TIBC", "IRON", "RETICCTPCT" in the last 72 hours. Urinalysis    Component Value Date/Time   COLORURINE YELLOW 02/28/2016 1226   APPEARANCEUR CLEAR 02/28/2016 1226   LABSPEC 1.022 02/28/2016 1226   PHURINE 5.0 02/28/2016 1226   GLUCOSEU NEGATIVE 02/28/2016 1226   HGBUR MODERATE (A) 02/28/2016 1226   BILIRUBINUR NEGATIVE 02/28/2016 1226   KETONESUR 15 (A) 02/28/2016 1226   PROTEINUR 30 (A) 02/28/2016 1226   UROBILINOGEN 0.2 02/17/2014 0940   NITRITE NEGATIVE 02/28/2016 1226   LEUKOCYTESUR NEGATIVE 02/28/2016 1226   Sepsis Labs Recent Labs  Lab 10/09/23 1728 10/10/23 0552 10/11/23 0541 10/12/23 0531  WBC 11.8* 9.6 8.6 8.9   Microbiology Recent Results (from the past 240 hours)  Blood culture (routine x 2)     Status: None (Preliminary result)   Collection Time: 10/09/23  5:40 PM   Specimen: BLOOD  Result Value Ref Range Status   Specimen Description   Final    BLOOD SITE NOT SPECIFIED Performed at [facility], [address], [place], [place] [address]    Special Requests   Final    BOTTLES DRAWN AEROBIC AND ANAEROBIC Blood Culture results may not be optimal due to an inadequate volume of blood received in culture bottles Performed at [facility], [address] [place], [place] [address]    Culture   Final    NO GROWTH 4 DAYS Performed at [facility], [address] [place], [place] [address]    Report Status PENDING  Incomplete  Blood culture (routine x 2)     Status: None (Preliminary result)   Collection Time: 10/09/23  5:56 PM   Specimen: Site Not Specified; Blood  Result Value Ref Range Status   Specimen Description   Final    SITE NOT SPECIFIED Performed at [facility], [address]  [place], [place] [address]    Special Requests   Final    BOTTLES DRAWN AEROBIC AND ANAEROBIC Blood Culture results may not be optimal due to an inadequate volume of blood received in culture bottles Performed at [facility], [address] [place], [place] [address]    Culture   Final    NO GROWTH 4 DAYS Performed at [facility], [address], [place], [place] [address]    Report Status PENDING  Incomplete  Resp panel by RT-PCR (RSV, Flu A&B, Covid) Anterior Nasal Swab     Status: None   Collection Time: 10/09/23  7:51 PM   Specimen: Anterior Nasal Swab  Result Value Ref Range Status   SARS Coronavirus 2 by RT PCR NEGATIVE  NEGATIVE Final    Comment: (NOTE) SARS-CoV-2 target nucleic acids are NOT DETECTED.  The SARS-CoV-2 RNA is generally detectable in upper respiratory specimens during the acute phase of infection. The lowest concentration of SARS-CoV-2 viral copies this assay can detect is 138 copies/mL. A negative result does not preclude SARS-Cov-2 infection and should not be used as the sole basis for treatment or other patient management decisions. A negative result may occur with  improper specimen collection/handling, submission of specimen other than nasopharyngeal swab, presence of viral mutation(s) within the areas targeted by this assay, and inadequate number of viral copies(<138 copies/mL). A negative result must be combined with clinical observations, patient history, and epidemiological information. The expected result is Negative.  Fact Sheet for Patients:  BloggerCourse.com  Fact Sheet for Healthcare Providers:  SeriousBroker.it  This test is no t yet approved or cleared by the [place] FDA and  has been authorized for detection and/or diagnosis of SARS-CoV-2 by FDA under an Emergency Use Authorization (EUA). This EUA will remain  in effect (meaning this test can be used) for the  duration of the COVID-19 declaration under Section 564(b)(1) of the Act, 21 U.S.C.section 360bbb-3(b)(1), unless the authorization is terminated  or revoked sooner.       Influenza A by PCR NEGATIVE NEGATIVE Final   Influenza B by PCR NEGATIVE NEGATIVE Final    Comment: (NOTE) The Xpert Xpress SARS-CoV-2/FLU/RSV plus assay is intended as an aid in the diagnosis of influenza from Nasopharyngeal swab specimens and should not be used as a sole basis for treatment. Nasal washings and aspirates are unacceptable for Xpert Xpress SARS-CoV-2/FLU/RSV testing.  Fact Sheet for Patients: BloggerCourse.com  Fact Sheet for Healthcare Providers: SeriousBroker.it  This test is not yet approved or cleared by the [place] FDA and has been authorized for detection and/or diagnosis of SARS-CoV-2 by FDA under an Emergency Use Authorization (EUA). This EUA will remain in effect (meaning this test can be used) for the duration of the COVID-19 declaration under Section 564(b)(1) of the Act, 21 U.S.C. section 360bbb-3(b)(1), unless the authorization is terminated or revoked.     Resp Syncytial Virus by PCR NEGATIVE NEGATIVE Final    Comment: (NOTE) Fact Sheet for Patients: BloggerCourse.com  Fact Sheet for Healthcare Providers: SeriousBroker.it  This test is not yet approved or cleared by the [place] FDA and has been authorized for detection and/or diagnosis of SARS-CoV-2 by FDA under an Emergency Use Authorization (EUA). This EUA will remain in effect (meaning this test can be used) for the duration of the COVID-19 declaration under Section 564(b)(1) of the Act, 21 U.S.C. section 360bbb-3(b)(1), unless the authorization is terminated or revoked.  Performed at [facility], [address] [place], [place] [address]   Respiratory (~20 pathogens) panel by PCR      Status: Abnormal   Collection Time: 10/10/23  2:37 PM   Specimen: Nasopharyngeal Swab; Respiratory  Result Value Ref Range Status   Adenovirus NOT DETECTED NOT DETECTED Final   Coronavirus 229E NOT DETECTED NOT DETECTED Final    Comment: (NOTE) The Coronavirus on the Respiratory Panel, DOES NOT test for the novel  Coronavirus (2019 nCoV)    Coronavirus HKU1 NOT DETECTED NOT DETECTED Final   Coronavirus NL63 NOT DETECTED NOT DETECTED Final   Coronavirus OC43 NOT DETECTED NOT DETECTED Final   Metapneumovirus DETECTED (A) NOT DETECTED Final   Rhinovirus / Enterovirus NOT DETECTED NOT DETECTED Final   Influenza A NOT DETECTED NOT DETECTED Final  Influenza B NOT DETECTED NOT DETECTED Final   Parainfluenza Virus 1 NOT DETECTED NOT DETECTED Final   Parainfluenza Virus 2 NOT DETECTED NOT DETECTED Final   Parainfluenza Virus 3 NOT DETECTED NOT DETECTED Final   Parainfluenza Virus 4 NOT DETECTED NOT DETECTED Final   Respiratory Syncytial Virus NOT DETECTED NOT DETECTED Final   Bordetella pertussis NOT DETECTED NOT DETECTED Final   Bordetella Parapertussis NOT DETECTED NOT DETECTED Final   Chlamydophila pneumoniae NOT DETECTED NOT DETECTED Final   Mycoplasma pneumoniae NOT DETECTED NOT DETECTED Final    Comment: Performed at [facility], [address] [place], [place] [address]  Expectorated Sputum Assessment w Gram Stain, Rflx to Resp Cult     Status: None   Collection Time: 10/10/23  8:45 PM   Specimen: Expectorated Sputum  Result Value Ref Range Status   Specimen Description EXPECTORATED SPUTUM  Final   Special Requests NONE  Final   Sputum evaluation   Final    THIS SPECIMEN IS ACCEPTABLE FOR SPUTUM CULTURE Performed at [facility], [address], [place], [place] [address]    Report Status 10/10/2023 FINAL  Final  Culture, Respiratory w Gram Stain     Status: None   Collection Time: 10/10/23  8:45 PM  Result Value Ref Range Status   Specimen  Description   Final    EXPECTORATED SPUTUM Performed at [facility], [address], [place], [place] [address]    Special Requests   Final    NONE Reflexed from [id number] Performed at [facility], [address] [place], [place] [address]    Gram Stain   Final    ABUNDANT WBC PRESENT,BOTH PMN AND MONONUCLEAR FEW GRAM POSITIVE COCCI    Culture   Final    FEW Normal respiratory flora-no Staph aureus or Pseudomonas seen Performed at [facility], [address] [place], [place] [address]    Report Status 10/13/2023 FINAL  Final    Procedures/Studies: DG CHEST PORT 1 VIEW Result Date: 10/11/2023 CLINICAL DATA:  [id number] Acute bronchitis due to human metapneumovirus [id number] EXAM: PORTABLE CHEST 1 VIEW COMPARISON:  10/10/2023 FINDINGS: Stable cardiomediastinal contours. Aortic atherosclerosis. Increasing patchy bibasilar opacities. Possible trace right pleural effusion. No pneumothorax. Calcific tendinopathy of the right rotator cuff. IMPRESSION: Increasing patchy bibasilar opacities, which may represent atelectasis versus pneumonia. Electronically Signed   By: Duanne Guess D.O.   On: 10/11/2023 12:02   DG Chest 2 View Result Date: 10/09/2023 CLINICAL DATA:  Hypoxia EXAM: CHEST - 2 VIEW COMPARISON:  02/28/2016 FINDINGS: Cardiac shadow is within normal limits. Aortic calcifications are noted. The lungs are well aerated bilaterally. Central vascular congestion is noted as well as some patchy opacities bilaterally but particularly in the left base. No bony abnormality is noted. IMPRESSION: Mild central vascular congestion. Bilateral patchy airspace opacities. These may represent edema or underlying infectious process. Electronically Signed   By: Alcide Clever M.D.   On: 10/09/2023 19:39     Time coordinating discharge: Over 30 minutes    Lewie Chamber, MD  [facility] 10/13/2023, 1:14 PM

## 2023-10-13 NOTE — Progress Notes (Signed)
 SATURATION QUALIFICATIONS: (This note is used to comply with regulatory documentation for home oxygen)  Patient Saturations on Room Air at Rest = 87%  Patient Saturations on Room Air while Ambulating = 91%  Patient Saturations on 2 Liters of oxygen while Ambulating = 93%  Please briefly explain why patient needs home oxygen:

## 2023-10-14 LAB — CULTURE, BLOOD (ROUTINE X 2)
Culture: NO GROWTH
Culture: NO GROWTH

## 2023-11-30 ENCOUNTER — Other Ambulatory Visit: Payer: Self-pay | Admitting: Internal Medicine

## 2023-11-30 DIAGNOSIS — Z1231 Encounter for screening mammogram for malignant neoplasm of breast: Secondary | ICD-10-CM

## 2023-12-20 ENCOUNTER — Other Ambulatory Visit (HOSPITAL_COMMUNITY): Payer: Self-pay | Admitting: *Deleted

## 2023-12-20 ENCOUNTER — Telehealth: Payer: Self-pay

## 2023-12-20 NOTE — Telephone Encounter (Signed)
 Auth Submission: APPROVED Site of care: Site of care: [facility] Payer: [employer] medicare Medication & CPT/J Code(s) submitted: Prolia  (Denosumab ) [id number] Route of submission (phone, fax, portal):  Phone # Fax # Auth type: Buy/Bill PB Units/visits requested: 60mg  x 2 doses Reference number: [id number] or 49 (can't make out handwriting) Approval from: 12/20/23 to 08/17/24

## 2023-12-21 ENCOUNTER — Ambulatory Visit (HOSPITAL_COMMUNITY)
Admission: RE | Admit: 2023-12-21 | Discharge: 2023-12-21 | Disposition: A | Discharge status: Home / Self Care | Source: Ambulatory Visit | Attending: Internal Medicine | Admitting: Internal Medicine

## 2023-12-21 DIAGNOSIS — M810 Age-related osteoporosis without current pathological fracture: Secondary | ICD-10-CM | POA: Insufficient documentation

## 2023-12-21 MED ORDER — DENOSUMAB 60 MG/ML ~~LOC~~ SOSY
PREFILLED_SYRINGE | SUBCUTANEOUS | Status: AC
  Filled 2023-12-21: qty 1

## 2023-12-21 MED ORDER — DENOSUMAB 60 MG/ML ~~LOC~~ SOSY
60.0000 mg | PREFILLED_SYRINGE | Freq: Once | SUBCUTANEOUS | Status: AC
  Administered 2023-12-21: 60 mg via SUBCUTANEOUS

## 2024-01-01 ENCOUNTER — Ambulatory Visit

## 2024-01-08 ENCOUNTER — Ambulatory Visit
Admission: RE | Admit: 2024-01-08 | Discharge: 2024-01-08 | Disposition: A | Discharge status: Home / Self Care | Source: Ambulatory Visit | Attending: Internal Medicine | Admitting: Internal Medicine

## 2024-01-08 DIAGNOSIS — Z1231 Encounter for screening mammogram for malignant neoplasm of breast: Secondary | ICD-10-CM

## 2024-01-13 ENCOUNTER — Emergency Department (HOSPITAL_COMMUNITY)

## 2024-01-13 ENCOUNTER — Inpatient Hospital Stay (HOSPITAL_COMMUNITY)
Admission: EM | Admit: 2024-01-13 | Discharge: 2024-01-17 | DRG: 563 | Disposition: A | Discharge status: Skilled Nursing Facility | Attending: Internal Medicine | Admitting: Internal Medicine

## 2024-01-13 ENCOUNTER — Other Ambulatory Visit: Payer: Self-pay

## 2024-01-13 ENCOUNTER — Encounter (HOSPITAL_COMMUNITY): Payer: Self-pay | Admitting: *Deleted

## 2024-01-13 DIAGNOSIS — W19XXXA Unspecified fall, initial encounter: Secondary | ICD-10-CM | POA: Diagnosis not present

## 2024-01-13 DIAGNOSIS — F32A Depression, unspecified: Secondary | ICD-10-CM | POA: Diagnosis present

## 2024-01-13 DIAGNOSIS — Z79899 Other long term (current) drug therapy: Secondary | ICD-10-CM | POA: Diagnosis not present

## 2024-01-13 DIAGNOSIS — Z96653 Presence of artificial knee joint, bilateral: Secondary | ICD-10-CM | POA: Diagnosis present

## 2024-01-13 DIAGNOSIS — Z881 Allergy status to other antibiotic agents status: Secondary | ICD-10-CM

## 2024-01-13 DIAGNOSIS — S0101XA Laceration without foreign body of scalp, initial encounter: Secondary | ICD-10-CM | POA: Diagnosis present

## 2024-01-13 DIAGNOSIS — Z888 Allergy status to other drugs, medicaments and biological substances status: Secondary | ICD-10-CM | POA: Diagnosis not present

## 2024-01-13 DIAGNOSIS — S7002XA Contusion of left hip, initial encounter: Secondary | ICD-10-CM | POA: Diagnosis present

## 2024-01-13 DIAGNOSIS — Z880 Allergy status to penicillin: Secondary | ICD-10-CM

## 2024-01-13 DIAGNOSIS — R7401 Elevation of levels of liver transaminase levels: Secondary | ICD-10-CM | POA: Diagnosis present

## 2024-01-13 DIAGNOSIS — E538 Deficiency of other specified B group vitamins: Secondary | ICD-10-CM | POA: Diagnosis present

## 2024-01-13 DIAGNOSIS — S0003XA Contusion of scalp, initial encounter: Secondary | ICD-10-CM | POA: Diagnosis present

## 2024-01-13 DIAGNOSIS — S92061A Displaced intraarticular fracture of right calcaneus, initial encounter for closed fracture: Secondary | ICD-10-CM | POA: Diagnosis present

## 2024-01-13 DIAGNOSIS — F419 Anxiety disorder, unspecified: Secondary | ICD-10-CM | POA: Diagnosis present

## 2024-01-13 DIAGNOSIS — S92062A Displaced intraarticular fracture of left calcaneus, initial encounter for closed fracture: Secondary | ICD-10-CM | POA: Diagnosis present

## 2024-01-13 DIAGNOSIS — M8580 Other specified disorders of bone density and structure, unspecified site: Secondary | ICD-10-CM | POA: Diagnosis present

## 2024-01-13 DIAGNOSIS — W01198A Fall on same level from slipping, tripping and stumbling with subsequent striking against other object, initial encounter: Secondary | ICD-10-CM | POA: Diagnosis present

## 2024-01-13 DIAGNOSIS — Y92015 Private garage of single-family (private) house as the place of occurrence of the external cause: Secondary | ICD-10-CM | POA: Diagnosis not present

## 2024-01-13 DIAGNOSIS — M25572 Pain in left ankle and joints of left foot: Secondary | ICD-10-CM | POA: Diagnosis present

## 2024-01-13 DIAGNOSIS — Z7951 Long term (current) use of inhaled steroids: Secondary | ICD-10-CM | POA: Diagnosis not present

## 2024-01-13 DIAGNOSIS — J479 Bronchiectasis, uncomplicated: Secondary | ICD-10-CM | POA: Diagnosis present

## 2024-01-13 DIAGNOSIS — S92002A Unspecified fracture of left calcaneus, initial encounter for closed fracture: Secondary | ICD-10-CM | POA: Diagnosis present

## 2024-01-13 DIAGNOSIS — I10 Essential (primary) hypertension: Secondary | ICD-10-CM | POA: Diagnosis present

## 2024-01-13 DIAGNOSIS — R918 Other nonspecific abnormal finding of lung field: Secondary | ICD-10-CM | POA: Diagnosis present

## 2024-01-13 DIAGNOSIS — Y92009 Unspecified place in unspecified non-institutional (private) residence as the place of occurrence of the external cause: Secondary | ICD-10-CM | POA: Diagnosis not present

## 2024-01-13 DIAGNOSIS — Z8249 Family history of ischemic heart disease and other diseases of the circulatory system: Secondary | ICD-10-CM

## 2024-01-13 DIAGNOSIS — Z7901 Long term (current) use of anticoagulants: Secondary | ICD-10-CM

## 2024-01-13 DIAGNOSIS — E559 Vitamin D deficiency, unspecified: Secondary | ICD-10-CM | POA: Diagnosis present

## 2024-01-13 DIAGNOSIS — W1789XA Other fall from one level to another, initial encounter: Secondary | ICD-10-CM | POA: Diagnosis present

## 2024-01-13 DIAGNOSIS — Z87891 Personal history of nicotine dependence: Secondary | ICD-10-CM

## 2024-01-13 DIAGNOSIS — I480 Paroxysmal atrial fibrillation: Secondary | ICD-10-CM | POA: Diagnosis present

## 2024-01-13 DIAGNOSIS — W19XXXS Unspecified fall, sequela: Secondary | ICD-10-CM | POA: Diagnosis not present

## 2024-01-13 DIAGNOSIS — S8255XA Nondisplaced fracture of medial malleolus of left tibia, initial encounter for closed fracture: Secondary | ICD-10-CM | POA: Diagnosis present

## 2024-01-13 DIAGNOSIS — S92001A Unspecified fracture of right calcaneus, initial encounter for closed fracture: Secondary | ICD-10-CM | POA: Diagnosis present

## 2024-01-13 LAB — CBC WITH DIFFERENTIAL/PLATELET
Abs Immature Granulocytes: 0.05 10*3/uL (ref 0.00–0.07)
Basophils Absolute: 0.1 10*3/uL (ref 0.0–0.1)
Basophils Relative: 0 %
Eosinophils Absolute: 0.3 10*3/uL (ref 0.0–0.5)
Eosinophils Relative: 2 %
HCT: 41.4 % (ref 36.0–46.0)
Hemoglobin: 13.3 g/dL (ref 12.0–15.0)
Immature Granulocytes: 0 %
Lymphocytes Relative: 9 %
Lymphs Abs: 1.3 10*3/uL (ref 0.7–4.0)
MCH: 32.7 pg (ref 26.0–34.0)
MCHC: 32.1 g/dL (ref 30.0–36.0)
MCV: 101.7 fL — ABNORMAL HIGH (ref 80.0–100.0)
Monocytes Absolute: 1 10*3/uL (ref 0.1–1.0)
Monocytes Relative: 7 %
Neutro Abs: 12 10*3/uL — ABNORMAL HIGH (ref 1.7–7.7)
Neutrophils Relative %: 82 %
Platelets: 226 10*3/uL (ref 150–400)
RBC: 4.07 MIL/uL (ref 3.87–5.11)
RDW: 15.4 % (ref 11.5–15.5)
WBC: 14.7 10*3/uL — ABNORMAL HIGH (ref 4.0–10.5)
nRBC: 0 % (ref 0.0–0.2)

## 2024-01-13 LAB — BASIC METABOLIC PANEL WITH GFR
Anion gap: 7 (ref 5–15)
BUN: 15 mg/dL (ref 8–23)
CO2: 27 mmol/L (ref 22–32)
Calcium: 9.3 mg/dL (ref 8.9–10.3)
Chloride: 107 mmol/L (ref 98–111)
Creatinine, Ser: 1 mg/dL (ref 0.44–1.00)
GFR, Estimated: 55 mL/min — ABNORMAL LOW (ref >60)
Glucose, Bld: 117 mg/dL — ABNORMAL HIGH (ref 70–99)
Potassium: 4.5 mmol/L (ref 3.5–5.1)
Sodium: 141 mmol/L (ref 135–145)

## 2024-01-13 LAB — CBC
HCT: 35.9 % — ABNORMAL LOW (ref 36.0–46.0)
Hemoglobin: 11.6 g/dL — ABNORMAL LOW (ref 12.0–15.0)
MCH: 32.6 pg (ref 26.0–34.0)
MCHC: 32.3 g/dL (ref 30.0–36.0)
MCV: 100.8 fL — ABNORMAL HIGH (ref 80.0–100.0)
Platelets: 217 10*3/uL (ref 150–400)
RBC: 3.56 MIL/uL — ABNORMAL LOW (ref 3.87–5.11)
RDW: 15.3 % (ref 11.5–15.5)
WBC: 10.6 10*3/uL — ABNORMAL HIGH (ref 4.0–10.5)
nRBC: 0 % (ref 0.0–0.2)

## 2024-01-13 MED ORDER — OXYCODONE-ACETAMINOPHEN 5-325 MG PO TABS: 1.0000 | ORAL_TABLET | Freq: Four times a day (QID) | ORAL | 0 refills | Status: DC | PRN

## 2024-01-13 MED ORDER — HYDROCODONE-ACETAMINOPHEN 5-325 MG PO TABS
1.0000 | ORAL_TABLET | Freq: Once | ORAL | Status: AC
  Administered 2024-01-13: 1 via ORAL
  Filled 2024-01-13: qty 1

## 2024-01-13 MED ORDER — MELATONIN 5 MG PO TABS: 5.0000 mg | ORAL_TABLET | Freq: Every evening | ORAL | Status: DC | PRN

## 2024-01-13 MED ORDER — ONDANSETRON HCL 4 MG/2ML IJ SOLN
4.0000 mg | Freq: Once | INTRAMUSCULAR | Status: AC
  Administered 2024-01-13: 4 mg via INTRAVENOUS
  Filled 2024-01-13: qty 2

## 2024-01-13 MED ORDER — IOHEXOL 350 MG/ML SOLN
75.0000 mL | Freq: Once | INTRAVENOUS | Status: AC | PRN
  Administered 2024-01-13: 75 mL via INTRAVENOUS

## 2024-01-13 MED ORDER — HYDROMORPHONE HCL 1 MG/ML IJ SOLN
0.5000 mg | Freq: Once | INTRAMUSCULAR | Status: AC
  Administered 2024-01-13: 0.5 mg via INTRAVENOUS
  Filled 2024-01-13: qty 1

## 2024-01-13 MED ORDER — SODIUM CHLORIDE 0.9 % IV BOLUS
1000.0000 mL | Freq: Once | INTRAVENOUS | Status: AC
  Administered 2024-01-13: 1000 mL via INTRAVENOUS

## 2024-01-13 MED ORDER — FENTANYL CITRATE PF 50 MCG/ML IJ SOSY
12.5000 ug | PREFILLED_SYRINGE | INTRAMUSCULAR | Status: AC
  Administered 2024-01-13: 12.5 ug via INTRAVENOUS
  Filled 2024-01-13: qty 1

## 2024-01-13 MED ORDER — ACETAMINOPHEN 325 MG PO TABS: 650.0000 mg | ORAL_TABLET | Freq: Four times a day (QID) | ORAL | Status: DC | PRN

## 2024-01-13 MED ORDER — MORPHINE SULFATE (PF) 2 MG/ML IV SOLN
2.0000 mg | INTRAVENOUS | Status: DC | PRN
  Administered 2024-01-13: 2 mg via INTRAVENOUS
  Filled 2024-01-13: qty 1

## 2024-01-13 MED ORDER — POLYETHYLENE GLYCOL 3350 17 G PO PACK: 17.0000 g | PACK | Freq: Every day | ORAL | Status: DC | PRN

## 2024-01-13 MED ORDER — OXYCODONE HCL 5 MG PO TABS
5.0000 mg | ORAL_TABLET | Freq: Four times a day (QID) | ORAL | Status: DC | PRN
  Administered 2024-01-14 – 2024-01-15 (×3): 5 mg via ORAL
  Filled 2024-01-13 (×4): qty 1

## 2024-01-13 MED ORDER — HYDROMORPHONE HCL 1 MG/ML IJ SOLN
1.0000 mg | INTRAMUSCULAR | Status: AC | PRN
  Administered 2024-01-14 (×2): 1 mg via INTRAVENOUS
  Filled 2024-01-13 (×2): qty 1

## 2024-01-13 NOTE — ED Notes (Signed)
 Dr Elnor aware of current VS orders for NS Bolus.

## 2024-01-13 NOTE — ED Provider Notes (Signed)
  Physical Exam  BP (!) 142/74   Pulse 94   Temp 97.7 F (36.5 C) (Oral)   Resp 15   Ht 5' 6 (1.676 m)   Wt 64.9 kg   SpO2 97%   BMI 23.08 kg/m   Physical Exam  Procedures  Procedures  ED Course / MDM   Clinical Course as of 01/13/24 1808  Sat Jan 13, 2024  1048 CT reviewed > chronic c5 fx   [SG]  1048 Ankle left xr w/ calcaneus fx and medial mal fx Ankle right xr w/ calcaneus fx [SG]  1049 WBC(!): 14.7 Likely 2/2 trauma, no fever, doubt sepsis [SG]  1050 Will consult ortho b/l calcaneus fx, left medial mal fx [SG]  1058 Lovenox x6 wks vs continue doac  [SG]  1059 Spoke w/ dr sharl f/u x1 wk > ct b/l ankle  [SG]  1144 Spoke with CM, wheelchair ordered, F2F signed  [SG]  1326 CT w/ b/l calcaneous fx; will place in splint CT CAP also shows small foci of bleeding along left hip [SG]  1414 Pt unable to go home 2/2 NWB b/l LE status, will see about SNF placement [SG]    Clinical Course User Index [SG] Elnor Jayson LABOR, DO   Medical Decision Making Amount and/or Complexity of Data Reviewed Labs: ordered. Decision-making details documented in ED Course. Radiology: ordered.  Risk Prescription drug management.   Received in signout.  Fall.  Scalp laceration.  Has calcaneal fractures.  Being evaluated by PT.  Likely will require placement.  Wound on scalp will need closing  Discussed with Dr. sharl from orthopedic surgery.  Thinks patient should be admitted to the hospital for pain control PT and serial hemoglobins to follow with the hematoma in the hip/buttock area.  Will discuss with hospitalist for admission       Patsey Lot, MD 01/13/24 8190

## 2024-01-13 NOTE — Discharge Instructions (Addendum)
 It was a pleasure caring for you today in the emergency department.  You have broken both of your heel bone/calcaneus.  Please follow orthopedics in approximately 1 week for recheck and further treatment  Please return to the emergency department for any worsening or worrisome symptoms.   Information on my medicine - ELIQUIS  (apixaban )  This medication education was reviewed with me or my healthcare representative as part of my discharge preparation.    Why was Eliquis  prescribed for you? Eliquis  was prescribed for you to reduce the risk of a blood clot forming that can cause a stroke if you have a medical condition called atrial fibrillation (a type of irregular heartbeat).  What do You need to know about Eliquis  ? Take your Eliquis  TWICE DAILY - one tablet in the morning and one tablet in the evening with or without food. If you have difficulty swallowing the tablet whole please discuss with your pharmacist how to take the medication safely.  Take Eliquis  exactly as prescribed by your doctor and DO NOT stop taking Eliquis  without talking to the doctor who prescribed the medication.  Stopping may increase your risk of developing a stroke.  Refill your prescription before you run out.  After discharge, you should have regular check-up appointments with your healthcare provider that is prescribing your Eliquis .  In the future your dose may need to be changed if your kidney function or weight changes by a significant amount or as you get older.  What do you do if you miss a dose? If you miss a dose, take it as soon as you remember on the same day and resume taking twice daily.  Do not take more than one dose of ELIQUIS  at the same time to make up a missed dose.  Important Safety Information A possible side effect of Eliquis  is bleeding. You should call your healthcare provider right away if you experience any of the following: Bleeding from an injury or your nose that does not  stop. Unusual colored urine (red or dark brown) or unusual colored stools (red or black). Unusual bruising for unknown reasons. A serious fall or if you hit your head (even if there is no bleeding).  Some medicines may interact with Eliquis  and might increase your risk of bleeding or clotting while on Eliquis . To help avoid this, consult your healthcare provider or pharmacist prior to using any new prescription or non-prescription medications, including herbals, vitamins, non-steroidal anti-inflammatory drugs (NSAIDs) and supplements.  This website has more information on Eliquis  (apixaban ): http://[email or web address] .com/eliquis dena

## 2024-01-13 NOTE — ED Triage Notes (Signed)
 Pt BIB [facility] from home c/o a fall. Pt was in the attic and fell through the floor down about 10 feet into the garage. Pt is c/o of head pain and bilateral ankle pain. Pt is on Eliquis  for A-fib.

## 2024-01-13 NOTE — Care Management (Addendum)
 Transition of Care [facility]) - Emergency Department Mini Assessment   Patient Details  Name: Monica Williamson MRN: [id number] Date of Birth: Aug 23, 1940  [facility]) CM/SW Contact:    Corean JAYSON Canary, RN Phone Number: 01/13/2024, 11:36 AM   Clinical Narrative:  Patient presented after a rfall through the attic Bilateral ankle FX Discussed with MD he will place orders for [facility] ,  Wheelchair ordered  from [place] has husband to assist her.  Awaiting ortho consult.  No HH history in [place]45 Spoke to patient and husband at bedisde. Asked about help at home, explained  home health and rehab. They have 5 steps to get in, her wheelchair will have to be lifted, then they live on one floor.They said they would think about it all, orthopedics still needs to see the patient. PT consult placed for recommendations, discussed with provider , nursing . And CSW   !340 asked to see CM. Went to bedside. Clarita Mau, patients friend and CSW was on the phone patient gave permission to speak aloud to her awaiting PT for assessment, the patient is agreeable to SNF if recommended. CSW aware of likelihood, wheelchair is in the room, ortho here to splint patient. Discussed, SNF, Home health and follow up with ortho, the patient thought ortho was coming to see her here.  ED Mini Assessment:                  Patient Contact and Communications        ,                 Admission diagnosis:  Trauma; Fall through attic; Head trauma Patient Active Problem List   Diagnosis Date Noted   Acute bronchitis due to human metapneumovirus 10/11/2023   ARF (acute renal failure) (HCC) 10/10/2023   Anxiety and depression 10/10/2023   OP (osteoporosis) 11/15/2021   Persistent atrial fibrillation (HCC)    Carotid artery disease (HCC) 08/15/2016   Post-nasal drip 03/30/2016   History of acute epiglottitis 03/30/2016   Stridor 02/28/2016   Sepsis (HCC) 02/28/2016   Epiglottitis  02/28/2016   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   Long term (current) use of anticoagulants 10/01/2012   PAF (paroxysmal atrial fibrillation) (HCC) 09/25/2012   Chest pain 10/26/2010   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   BRONCHIECTASIS 08/13/2007   Allergic rhinitis 05/12/2007   Extrinsic asthma 05/12/2007   ABSCESS, BREAST 05/12/2007   Chronic cough 05/12/2007   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   PCP:  [name]., MD Pharmacy:   [facility] [phone] [place], [place] - [address] AT [facility] [address] [facility] [address] [place] [address] Phone: [phone] Fax: [phone]  [facility] - [place], [place] - [address] [place] [address] Phone: [phone] Fax: [phone]  [facility]) - [place], [place] - [address] [facility] [address] [place] [address] [place] [address] Phone: [phone] Fax: [phone]  [facility] - [place], [place] - [address] [place] [address] Phone: [phone] Fax: [phone]  [id number] - [place], [place] - [address] AT [facility] [address] [place] [address] Phone: [phone] Fax: [phone]  [place] [facility] [address] [place] [address] Phone: [phone] Fax: [phone]

## 2024-01-13 NOTE — ED Notes (Signed)
Transported to CT with RN  

## 2024-01-13 NOTE — ED Notes (Signed)
 0.5 mg / 0.5 ml of Dilaudid  wasted with Milo Hammersmith, RN

## 2024-01-13 NOTE — ED Notes (Signed)
 Ice packs applied to both ankles. Husband at bedside.

## 2024-01-13 NOTE — ED Notes (Incomplete)
 Trauma Response Nurse Documentation   Monica Williamson is a 83 y.o. female arriving to [facility] via [facility]  On Eliquis  (apixaban ) daily. Trauma was activated as a Level 2 by Monica Williamson based on the following trauma criteria Elderly patients > 65 with head trauma on anti-coagulation (excluding ASA).  Patient cleared for CT by Dr. CANDIE Pereyra. Pt transported to CT with primary nurse present to monitor. RN remained with the patient throughout their absence from the department for clinical observation.   GCS 15.  History   Past Medical History:  Diagnosis Date   Anxiety    Arthritis    Atrial fibrillation (HCC)    when takes Warfarin   Atrial fibrillation, currently in sinus rhythm    Bronchiectasis (HCC)    contolled with lovent and allergy med   Childhood asthma    Cholelithiasis    Colon polyps    Complication of anesthesia    Depression    Diverticulosis    Dysrhythmia    sees Dr Alveta annually for h/o Afib   H/O bronchiectasis    History of airborne allergies    uses inhalers   Hypertension    Nocturia    Osteopenia    PONV (postoperative nausea and vomiting)    severe n/v post anesthesia    Pulmonary nodule    Sleep related teeth grinding    wears a mouth guard at night   Stress fracture of tibia 2014   Vitamin D deficiency      Past Surgical History:  Procedure Laterality Date   BREAST CYST EXCISION Right    over 20 years ago   BREAST SURGERY     CARDIOVERSION N/A 06/27/2019   Procedure: CARDIOVERSION;  Surgeon: Monica Williamson, Monica PARAS, Monica Williamson;  Location: [facility];  Service: Cardiovascular;  Laterality: N/A;   CHOLECYSTECTOMY     11/2010   INCISION AND DRAINAGE BREAST ABSCESS     JOINT REPLACEMENT Right 09/2013   knee   MENISCUS REPAIR Right 2014   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   Procedure: RIGHT TOTAL KNEE ARTHROPLASTY;  Surgeon: Monica Raman, Monica Williamson;  Location: [facility];  Service: Orthopedics;  Laterality: Right;   TOTAL KNEE  ARTHROPLASTY Left 02/24/2014   dr Williamson   TOTAL KNEE ARTHROPLASTY Left 02/24/2014   Procedure: TOTAL KNEE ARTHROPLASTY;  Surgeon: Monica Raman, Monica Williamson;  Location: [facility];  Service: Orthopedics;  Laterality: Left;   VAGINAL HYSTERECTOMY     VESICOVAGINAL FISTULA CLOSURE W/ TAH         Initial Focused Assessment (If applicable, Williamson please see trauma documentation): Airway - clear Breathing - unlabored, lungs clear Circulation - strong peripheral pulses. GCS -15  CT's Completed:   CT Head and CT C-Spine   Interventions:  Labs Xrays CT scans  Plan for disposition:  {Trauma Dispo:26867}   Consults completed:  {Trauma Consults:26862} at ***.  Event Summary: Pt was walking in an unfloored attic, on the beams, slipped and fell through the dry wall ceiling- states only about 10 feet  Laceration to head, bleeding controlled at present- EMS and pt state there was a large amount of blood at the scene. Scrapes to both elbows, bilateral ankle pain. Right ankle swollen, positive pedal pulses in both feet.  Log rolled per Dr. Pereyra- on exam, denies any tenderness to spine. Has full ROM and sensation to all extremities.   Husband brought to bedside from waiting room.   MTP Summary (If applicable):   Bedside  handoff with {Trauma handoff:26863::ED RN} ***.    Monica [facility]  Trauma Response RN  Please call TRN at [phone] for further assistance.

## 2024-01-13 NOTE — ED Notes (Signed)
 Leita patient daughter called update given would like to talk to ortho when he arrives , [phone]

## 2024-01-13 NOTE — ED Notes (Signed)
 CCMD called, pt on monitor

## 2024-01-13 NOTE — Progress Notes (Signed)
 Physical Therapy Quick Note  PT has completed initial evaluation.    Overall, patient at [facility] assistance level.   PT Follow up recommended: Inpatient follow up therapy, < 3 hours/day Equipment recommended:  Hospital Bed, Hoyer Lift and tub bench, pending pt's discharge location.  Complete evaluation note to follow.     Leontine Hilt, [facility] [phone]

## 2024-01-13 NOTE — Progress Notes (Signed)
 Orthopedic Tech Progress Note Patient Details:  Monica Williamson 21-May-1941 [id number] Level 2 Trauma  Patient ID: [name], female   DOB: 1941/02/04, 83 y.o.   MRN: [id number]  Massie FORBES Bar 01/13/2024, 8:44 AM

## 2024-01-13 NOTE — ED Notes (Signed)
 Pt placed onto inpatient hospital bed.

## 2024-01-13 NOTE — Evaluation (Signed)
 Physical Therapy Evaluation Patient Details Name: Monica Williamson MRN: [id number] DOB: Sep 02, 1940 Today's Date: 01/13/2024  History of Present Illness  Pt is a 83 y.o. F presenting to [facility] following a fall from her attic. IMG shows bilateral ankle fx. PMH is significant for anxiety, A-fib, bronchiectasis, cholelithiasis, diverticulsosi, HTN, and OP.  Clinical Impression  Prior to admittance pt was mobilizing independent w/out an AD and was independent w/ all ADLs. Pt presents to evaluation with deficits in mobility, strength, power, balance, activity tolerance, and pain, all limiting pt's ability to mobilize near baseline. Pt able to perform scooting at edge of bed w/out physical assistance. Attempted slide board transfer but unable to complete due to significant pain and fatigue. Pt would benefit from further transfer training. PT will continue to treat pt while she is admitted. Patient will benefit from continued inpatient follow up therapy, <3 hours/day.          If plan is discharge home, recommend the following: A lot of help with walking and/or transfers;A lot of help with bathing/dressing/bathroom;Assistance with cooking/housework;Assist for transportation;Help with stairs or ramp for entrance   Can travel by private vehicle   No    Equipment Recommendations Hospital bed;Hoyer lift;Other (comment) (tub bench)  Recommendations for Other Services  OT consult    Functional Status Assessment Patient has had a recent decline in their functional status and demonstrates the ability to make significant improvements in function in a reasonable and predictable amount of time.     Precautions / Restrictions Precautions Precautions: Fall Recall of Precautions/Restrictions: Intact Required Braces or Orthoses: Splint/Cast Splint/Cast: bilateral LE Splint/Cast - Date Prophylactic Dressing Applied (if applicable): 01/13/24 Restrictions Weight Bearing Restrictions Per Provider Order:  Yes RLE Weight Bearing Per Provider Order: Non weight bearing LLE Weight Bearing Per Provider Order: Non weight bearing      Mobility  Bed Mobility Overal bed mobility: Needs Assistance Bed Mobility: Supine to Sit, Sit to Supine     Supine to sit: Supervision, HOB elevated Sit to supine: Supervision, HOB elevated   General bed mobility comments: Increased time to complete    Transfers Overall transfer level: Needs assistance Equipment used: Sliding board Transfers: Bed to chair/wheelchair/BSC            Lateral/Scoot Transfers: Mod assist. Pt did not successfully complete transfer to [facility]; was able to scoot edge of bed.  General transfer comment: Pt performed forward/backwards and lateral scooting at EOB w/ supervision. Pt displaying minimal elevation of buttock off of bed. VC given for sequencing and head-hips relationship; increased time and effort to complete. Pt trialed slideboard transfer from bed to [facility] on the R but was unable to complete reporting significant pain throughout body, and fatigue.    Ambulation/Gait                  Stairs            Wheelchair Mobility     Tilt Bed    Modified Rankin (Stroke Patients Only)       Balance Overall balance assessment: Needs assistance Sitting-balance support: Feet unsupported, No upper extremity supported Sitting balance-Leahy Scale: Fair Sitting balance - Comments: seated EOB; is able to shift weight throughout lateral scoots w/ BUE support and no losses of balance noted                                     Pertinent Vitals/Pain  Pain Assessment Pain Assessment: Faces Faces Pain Scale: Hurts even more Pain Location: bilateral LE, bilateral UE, ribs, neck Pain Descriptors / Indicators: Constant, Crying, Discomfort, Grimacing, Moaning, Sore, Tender Pain Intervention(s): Limited activity within patient's tolerance, Monitored during session    Home Living Family/patient expects to be  discharged to:: Private residence Living Arrangements: Spouse/significant other Available Help at Discharge: Family;Available 24 hours/day (pt's spouse and family) Type of Home: House Home Access: Stairs to enter Entrance Stairs-Rails: Doctor, general practice of Steps: 4   Home Layout: One level Home Equipment: Other (comment);Hand held shower head;Wheelchair - manual;BSC/3in1 (slideboard)      Prior Function Prior Level of Function : Independent/Modified Independent;Driving             Mobility Comments: independent w/out an AD ADLs Comments: independent     Extremity/Trunk Assessment   Upper Extremity Assessment Upper Extremity Assessment: Generalized weakness    Lower Extremity Assessment Lower Extremity Assessment: Generalized weakness;RLE deficits/detail;LLE deficits/detail RLE: Unable to fully assess due to immobilization LLE: Unable to fully assess due to immobilization    Cervical / Trunk Assessment Cervical / Trunk Assessment: Kyphotic  Communication   Communication Communication: No apparent difficulties    Cognition Arousal: Alert Behavior During Therapy: WFL for tasks assessed/performed   PT - Cognitive impairments: No apparent impairments                         Following commands: Intact       Cueing Cueing Techniques: Verbal cues, Visual cues, Gestural cues     General Comments General comments (skin integrity, edema, etc.): no signs of acute distress    Exercises     Assessment/Plan    PT Assessment Patient needs continued PT services  PT Problem List Decreased strength;Decreased range of motion;Decreased activity tolerance;Decreased balance;Decreased mobility;Decreased coordination;Decreased knowledge of use of DME;Decreased safety awareness;Pain       PT Treatment Interventions DME instruction;Functional mobility training;Therapeutic activities;Therapeutic exercise;Balance training;Neuromuscular  re-education;Patient/family education;Wheelchair mobility training;Manual techniques;Modalities    PT Goals (Current goals can be found in the Care Plan section)  Acute Rehab PT Goals Patient Stated Goal: to go home PT Goal Formulation: With patient/family Time For Goal Achievement: 01/27/24 Potential to Achieve Goals: Fair Additional Goals Additional Goal #1: Pt will self-propel manual wheelchair 50' w/ minimal cueing and minimal assistance    Frequency Min 2X/week     Co-evaluation               AM-PAC PT 6 Clicks Mobility  Outcome Measure Help needed turning from your back to your side while in a flat bed without using bedrails?: A Little Help needed moving from lying on your back to sitting on the side of a flat bed without using bedrails?: A Little Help needed moving to and from a bed to a chair (including a wheelchair)?: A Lot Help needed standing up from a chair using your arms (e.g., wheelchair or bedside chair)?: Total Help needed to walk in hospital room?: Total Help needed climbing 3-5 steps with a railing? : Total 6 Click Score: 11    End of Session Equipment Utilized During Treatment: Gait belt Activity Tolerance: Patient tolerated treatment well Patient left: in bed;with call bell/phone within reach Nurse Communication: Mobility status PT Visit Diagnosis: Muscle weakness (generalized) (M62.81);History of falling (Z91.81);Pain Pain - Right/Left:  (bilateral) Pain - part of body: Arm;Leg;Ankle and joints of foot (ribs)    Time: 8556-8460 PT Time Calculation (min) (ACUTE ONLY): 56  min   Charges:   PT Evaluation $PT Eval Low Complexity: 1 Low PT Treatments $Therapeutic Activity: 8-22 mins PT General Charges $$ ACUTE PT VISIT: 1 Visit         Kassady Laboy, SPT Acute Rehab [phone]    Leontine Hilt 01/13/2024, 6:06 PM

## 2024-01-13 NOTE — Consult Note (Signed)
 ORTHOPAEDIC CONSULTATION  REQUESTING PHYSICIAN: Patsey Lot, MD  PCP:  [name]., MD  Chief Complaint: Fall through attic  HPI: Monica Williamson is a 83 y.o. female who complains of low back pain as well as bilateral foot and ankle pain.  She was in the attic over the garage when she missed a step and fell between the support beams of the roof/ceiling.  She went through and landed on her husband's car in the garage.  Landed on bilateral feet.  In the ER she was found to have bilateral calcaneus fractures.  CT scan of her lumbar spine was negative.  Also found to have left hip hematoma but no fracture.  Orthopedic surgery consulted for calcaneus fracture management.  She lives independently with her husband.  Denies smoking or diabetes.  Past Medical History:  Diagnosis Date   Anxiety    Arthritis    Atrial fibrillation (HCC)    when takes Warfarin   Atrial fibrillation, currently in sinus rhythm    Bronchiectasis (HCC)    contolled with lovent and allergy med   Childhood asthma    Cholelithiasis    Colon polyps    Complication of anesthesia    Depression    Diverticulosis    Dysrhythmia    sees Dr Alveta annually for h/o Afib   H/O bronchiectasis    History of airborne allergies    uses inhalers   Hypertension    Nocturia    Osteopenia    PONV (postoperative nausea and vomiting)    severe n/v post anesthesia    Pulmonary nodule    Sleep related teeth grinding    wears a mouth guard at night   Stress fracture of tibia 2014   Vitamin D deficiency    Past Surgical History:  Procedure Laterality Date   BREAST CYST EXCISION Right    over 20 years ago   BREAST SURGERY     CARDIOVERSION N/A 06/27/2019   Procedure: CARDIOVERSION;  Surgeon: Nahser, Aleene PARAS, MD;  Location: [facility] ENDOSCOPY;  Service: Cardiovascular;  Laterality: N/A;   CHOLECYSTECTOMY     11/2010   INCISION AND DRAINAGE BREAST ABSCESS     JOINT REPLACEMENT Right 09/2013   knee   MENISCUS  REPAIR Right 2014   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   Procedure: RIGHT TOTAL KNEE ARTHROPLASTY;  Surgeon: Marcey Raman, MD;  Location: [facility] OR;  Service: Orthopedics;  Laterality: Right;   TOTAL KNEE ARTHROPLASTY Left 02/24/2014   dr raman   TOTAL KNEE ARTHROPLASTY Left 02/24/2014   Procedure: TOTAL KNEE ARTHROPLASTY;  Surgeon: Marcey Raman, MD;  Location: [facility] OR;  Service: Orthopedics;  Laterality: Left;   VAGINAL HYSTERECTOMY     VESICOVAGINAL FISTULA CLOSURE W/ TAH     Social History   Socioeconomic History   Marital status: Married    Spouse name: Not on file   Number of children: 3   Years of education: Not on file   Highest education level: Not on file  Occupational History   Occupation: Retired  Tobacco Use   Smoking status: Former    Current packs/day: 0.00    Average packs/day: 0.8 packs/day for 20.0 years (15.0 ttl pk-yrs)    Types: Cigarettes    Start date: 07/18/1958    Quit date: 07/18/1978    Years since quitting: 45.5   Smokeless tobacco: Never  Substance and Sexual Activity   Alcohol  use: Not on file    Comment: daily cocktail  Drug use: No   Sexual activity: Yes    Birth control/protection: Post-menopausal  Other Topics Concern   Not on file  Social History Narrative   Not on file   Social Drivers of Health   Financial Resource Strain: Not on file  Food Insecurity: No Food Insecurity (10/09/2023)   Hunger Vital Sign    Worried About Running Out of Food in the Last Year: Never true    Ran Out of Food in the Last Year: Never true  Transportation Needs: No Transportation Needs (10/09/2023)   PRAPARE - Administrator, Civil Service (Medical): No    Lack of Transportation (Non-Medical): No  Physical Activity: Not on file  Stress: Not on file  Social Connections: Socially Integrated (10/09/2023)   Social Connection and Isolation Panel    Frequency of Communication with Friends and Family: More than three times a week    Frequency of Social  Gatherings with Friends and Family: More than three times a week    Attends Religious Services: More than 4 times per year    Active Member of [employer] or Organizations: Yes    Attends Engineer, structural: More than 4 times per year    Marital Status: Married   Family History  Problem Relation Age of Onset   Heart disease Sister    Colon cancer Neg Hx    Allergies  Allergen Reactions   Warfarin And Related Hives and Itching   Amoxicillin  Itching   Augmentin  [Amoxicillin -Pot Clavulanate] Rash   Avelox [Moxifloxacin] Diarrhea   Prior to Admission medications   Medication Sig Start Date End Date Taking? Authorizing Provider  oxyCODONE -acetaminophen  (PERCOCET/ROXICET) 5-325 MG tablet Take 1 tablet by mouth every 6 (six) hours as needed for severe pain (pain score 7-10). 01/13/24  Yes Elnor Jayson LABOR, DO  albuterol  (PROVENTIL  HFA;VENTOLIN  HFA) 108 (90 Base) MCG/ACT inhaler Inhale 2 puffs into the lungs 2 (two) times daily as needed for wheezing or shortness of breath. 03/09/16   Parrett, Tammy S, NP  ALPRAZolam  (XANAX ) 0.5 MG tablet Take 0.25 mg by mouth at bedtime as needed for anxiety or sleep.     [provider]  apixaban  (ELIQUIS ) 5 MG TABS tablet Take 1 tablet (5 mg total) by mouth 2 (two) times daily. 10/05/23   Nahser, Aleene PARAS, MD  Ascorbic Acid (VITAMIN C PO) Take 1 tablet by mouth daily.    [provider]  atorvastatin  (LIPITOR) 40 MG tablet Take 40 mg by mouth daily at 6 PM.  02/16/17   [provider]  Baclofen 5 MG TABS 1 tablet as needed Orally TID as needed for 30 days 06/21/21   [provider]  buPROPion  (WELLBUTRIN  XL) 150 MG 24 hr tablet Take 150 mg by mouth every morning.  10/26/10   Nahser, Aleene PARAS, MD  cetirizine (ZYRTEC) 10 MG tablet Take 10 mg by mouth daily.    [provider]  Cholecalciferol (VITAMIN D-1000 MAX ST) 25 MCG (1000 UT) tablet Take 1,000 Units by mouth daily.    [provider]  cyclobenzaprine  (FLEXERIL) 10 MG tablet Take 10 mg by mouth daily as needed for muscle spasms. 06/18/21   [provider]  denosumab  (PROLIA ) 60 MG/ML SOSY injection Inject 60 mg into the skin every 6 (six) months. 09/05/13   [provider]  diltiazem  (DILT-XR) 180 MG 24 hr capsule Take 1 capsule (180 mg total) by mouth daily. 10/05/23   Nahser, Aleene PARAS, MD  ezetimibe  (  ZETIA ) 10 MG tablet Take 10 mg by mouth daily.  03/05/18   [provider]  fluticasone  (FLONASE ) 50 MCG/ACT nasal spray Place 2 sprays into both nostrils daily. Patient taking differently: Place 2 sprays into both nostrils daily as needed for allergies. 03/31/16   Geronimo Amel, MD  fluticasone  (FLOVENT  HFA) 110 MCG/ACT inhaler Inhale 2 puffs into the lungs 2 (two) times daily as needed (for shortness of breath). 03/09/16   Parrett, Madelin RAMAN, NP  L-Lysine 1000 MG TABS Take 1 tablet by mouth as needed (cold sores). 03/19/20   [provider]  metoprolol  tartrate (LOPRESSOR ) 50 MG tablet Take 1 tablet (50 mg total) by mouth 2 (two) times daily. 03/22/23   Nahser, Aleene PARAS, MD  ondansetron  (ZOFRAN ) 4 MG tablet Take 4 mg by mouth every 6 (six) hours as needed for nausea or vomiting. 10/09/23   [provider]  pantoprazole  (PROTONIX ) 40 MG tablet Take 40 mg by mouth daily. Patient not taking: Reported on 10/09/2023 05/06/22   [provider]  psyllium (METAMUCIL) 58.6 % powder Take 1 packet by mouth daily.    [provider]  QVAR REDIHALER 40 MCG/ACT inhaler Inhale 2 puffs into the lungs 2 (two) times daily as needed (sob). 10/06/23   [provider]  Zinc 50 MG TABS 1 tablet Orally Once a day    [provider]   CT Foot Left Wo Contrast Result Date: 01/13/2024 CLINICAL DATA:  Blunt trauma. Patient fell through attic floor into the garage. EXAM: CT OF THE LEFT FOOT WITHOUT CONTRAST TECHNIQUE: Multidetector CT imaging of the left foot was performed according to the standard  protocol. Multiplanar CT image reconstructions were also generated. RADIATION DOSE REDUCTION: This exam was performed according to the departmental dose-optimization program which includes automated exposure control, adjustment of the mA and/or kV according to patient size and/or use of iterative reconstruction technique. COMPARISON:  Same day radiographs of the left ankle dated 01/13/2024. FINDINGS: Bones/Joint/Cartilage Extensively comminuted fracture of the calcaneus. There is approximately 5 mm of lateral displacement posterolaterally (series 1, image 145). Fracture margins extend through the posterior and plantar aspects of the calcaneal tuberosity, the posterior subtalar joint, the sustentaculum tali, and the anterior calcaneal process. Anterior calcaneal process fracture fragment extends into the region of the sinus tarsi. Minimally distracted fracture of the superior margin of the posterior talar process (series 6, image 29). Nondisplaced fracture of the medial malleolus (series 1, image 146). No significant clear space widening of the ankle mortise. Ligaments Ligaments are suboptimally evaluated by CT. Muscles and Tendons No intramuscular fluid collection or hematoma. Soft tissue Soft tissue swelling of the ankle and hindfoot, most pronounced posteriorly. No loculated fluid collection. No radiopaque foreign body IMPRESSION: 1. Extensively comminuted, intra-articular and displaced fracture of the left calcaneus, as above. 2. Nondisplaced fracture of the medial malleolus. 3. Minimally distracted fracture of the posterior talar process. Electronically Signed   By: Harrietta Sherry M.D.   On: 01/13/2024 13:12   CT Foot Right Wo Contrast Result Date: 01/13/2024 CLINICAL DATA:  Blunt trauma. Patient fell throughout attic floor into garage. EXAM: CT OF THE RIGHT FOOT WITHOUT CONTRAST TECHNIQUE: Multidetector CT imaging of the right foot was performed according to the standard protocol. Multiplanar CT image  reconstructions were also generated. RADIATION DOSE REDUCTION: This exam was performed according to the departmental dose-optimization program which includes automated exposure control, adjustment of the mA and/or kV according to patient size and/or use of iterative reconstruction technique. COMPARISON:  Same day radiographs of the right ankle dated 01/13/2024. FINDINGS: Bones/Joint/Cartilage Extensively comminuted fracture of the calcaneus. There is approximately 8 mm of lateral displacement posterolaterally (series 3, image 129). Fracture margins extend through the posterior and plantar aspects of the calcaneal tuberosity, the posterior subtalar joint, the sustentaculum tali, the anterior calcaneal process, as well as the articular surface at the level of the calcaneocuboid joint. Anterior calcaneal process fracture fragments extend into the level of the sinus tarsi. Tiny linear osseous fragment adjacent to the medial plantar base of the cuboid is compatible with a minimally distracted avulsion fracture (series 7, image 56). Nondisplaced fracture at the proximal dorsal margin of the medial cuneiform (series 8, image 36 and series 3, image 87). No evidence of diastasis or malalignment at the level of the Lisfranc interval. Tibiotalar joint is anatomically aligned. Ankle mortise appears congruent. Ligaments Ligaments are suboptimally evaluated by CT. Muscles and Tendons No intramuscular fluid collection or hematoma. Soft tissue Soft tissue swelling of the ankle and hindfoot, most pronounced posterolaterally. No loculated fluid collection. No radiopaque foreign body. IMPRESSION: 1. Extensively comminuted, intra-articular and displaced fracture of the right calcaneus, as above. 2. Nondisplaced fracture at the proximal dorsal margin of the medial cuneiform. 3. Tiny linear osseous fragment adjacent to the medial plantar base of the cuboid is compatible with a minimally distracted avulsion fracture. Electronically Signed    By: Harrietta Sherry M.D.   On: 01/13/2024 13:00   CT CHEST ABDOMEN PELVIS W CONTRAST Result Date: 01/13/2024 CLINICAL DATA:  Poly trauma, blunt. EXAM: CT CHEST, ABDOMEN, AND PELVIS WITH CONTRAST TECHNIQUE: Multidetector CT imaging of the chest, abdomen and pelvis was performed following the standard protocol during bolus administration of intravenous contrast. RADIATION DOSE REDUCTION: This exam was performed according to the departmental dose-optimization program which includes automated exposure control, adjustment of the mA and/or kV according to patient size and/or use of iterative reconstruction technique. CONTRAST:  75mL OMNIPAQUE IOHEXOL 350 MG/ML SOLN COMPARISON:  CT chest 03/08/2013 FINDINGS: CT CHEST FINDINGS Cardiovascular: Atherosclerotic calcifications in thoracic aorta without aneurysm. No evidence for an aortic dissection. Heart size is normal. No significant pericardial effusion. Mediastinum/Nodes: Negative for a mediastinal hematoma. No lymph node enlargement in the mediastinum or hila. No axillary lymph node enlargement. Negative for pneumomediastinum. Esophagus is unremarkable. Lungs/Pleura: Negative for pneumothorax. Mild scarring at the lung apices. Mild bronchiectasis in the medial right upper lobe on image 77/4 and this is chronic. Small nodular densities near this bronchiectasis appears new and largest nodule measures 5 mm on image 77/4. Additional bronchiectasis and small nodular densities in the right middle lobe. These nodular densities are suggestive for post inflammatory changes based on the morphology. No pleural effusions. Peripheral nodular and tubular densities in the posterior left lower lobe, best seen on image 89/4. Elongated tubular nodular area on image 89/4 measures up to 9 mm. Multiple small nodular densities scattered throughout the left lung. Musculoskeletal: No acute bone abnormality. CT ABDOMEN PELVIS FINDINGS Hepatobiliary: Cholecystectomy. Mild intrahepatic biliary  dilatation. Main portal venous system is patent. No evidence for acute injury or laceration. Pancreas: 3 mm hypodensity near the pancreatic tail appears chronic and likely an incidental finding. No evidence for pancreatic duct dilatation or inflammation. Spleen: Normal in size without focal abnormality. Adrenals/Urinary Tract: Mild fullness of the adrenal glands appear chronic. There is probably a small right adrenal adenoma based on the prior chest CT. 3 mm calcification in left kidney lower pole. Negative for hydronephrosis. 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 No suspicious renal lesion. Evidence for renal cysts that do not require dedicated follow-up. No hydronephrosis. Normal urinary bladder. Stomach/Bowel: Moderate distention of the rectum with stool. No acute bowel inflammation. No evidence for bowel dilatation or obstruction. Vascular/Lymphatic: Aortic atherosclerosis. No enlarged abdominal or pelvic lymph nodes. Reproductive: Uterus is absent. Low-density material or fluid in the region of the right adnexa on image 100/30 is nonspecific but likely within normal limits. No suspicious adnexal mass. Other: No evidence for free fluid.  Negative for free air. Musculoskeletal: Soft tissue edema and probable hematoma along the lateral left hip at the level of the left greater trochanter. This soft tissue abnormalities are involving the subcutaneous tissues and the lateral aspect of the gluteal musculature. Small hypervascular areas in this area and cannot exclude small areas of bleeding. Both hips are located. Multilevel degenerative disc space narrowing and endplate disease in lumbar spine. No acute bone abnormality. IMPRESSION: 1. Soft tissue edema and hematoma along the lateral left hip at the level of the left greater trochanter. This hematoma is involving the subcutaneous tissue and lateral gluteal musculature. Concern for small foci of contrast extravasation and bleeding in this area. 2. No  other acute abnormality in the chest, abdomen or pelvis. 3. Chronic lung changes with multiple small nodular densities scattered throughout the lungs. Morphology is suggestive for post infectious or inflammatory changes. Largest nodular area measures up to 9 mm. Recommend follow-up chest CT in 3 months to ensure resolution. 4. Nonobstructing left renal calculus. 5. Aortic Atherosclerosis (ICD10-I70.0). These results were called by telephone at the time of interpretation on 01/13/2024 at 12:53 pm to provider JAYSON PEREYRA , who verbally acknowledged these results. Electronically Signed   By: Juliene Balder M.D.   On: 01/13/2024 12:57   CT Thoracic Spine Wo Contrast Result Date: 01/13/2024 CLINICAL DATA:  Back pain.  Trauma.  Status post fall. EXAM: CT THORACIC SPINE WITHOUT CONTRAST TECHNIQUE: Multidetector CT images of the thoracic were obtained using the standard protocol without intravenous contrast. RADIATION DOSE REDUCTION: This exam was performed according to the departmental dose-optimization program which includes automated exposure control, adjustment of the mA and/or kV according to patient size and/or use of iterative reconstruction technique. COMPARISON:  CT chest from 03/08/2013 FINDINGS: Alignment: There is upper thoracic kyphosis deformity. Vertebrae: No signs acute fracture or dislocation. Mild anterior wedging the T8 vertebra identified which appears unchanged from 03/08/2013. Paraspinal and other soft tissues: No signs hematoma within the thoracic canal. No paraspinal fluid collections identified. Trace bilateral pleural effusions. Diffuse bronchial wall thickening identified. Interlobular septal thickening identified compatible with mild edema. Aortic atherosclerosis. Nodular thickening of bilateral adrenal glands likely reflect underlying adenomas. Disc levels: Multilevel disc space narrowing with endplate spurring. Calcified posterior disc bulge/herniation noted at T8-9 without signs of spinal  stenosis. IMPRESSION: 1. No signs of acute fracture or dislocation. 2. Upper thoracic kyphosis deformity. 3. Multilevel degenerative disc disease. 4. Trace bilateral pleural effusions. 5. Diffuse bronchial wall thickening and interlobular septal thickening compatible with mild edema. 6.  Aortic Atherosclerosis (ICD10-I70.0). Electronically Signed   By: Waddell Calk M.D.   On: 01/13/2024 10:17   CT Lumbar Spine Wo Contrast Result Date: 01/13/2024 CLINICAL DATA:  Low back pain after fall.  Trauma. EXAM: CT LUMBAR SPINE WITHOUT CONTRAST TECHNIQUE: Multidetector CT imaging of the lumbar spine was performed without intravenous contrast administration. Multiplanar CT image reconstructions were also generated. RADIATION DOSE REDUCTION: This exam was performed according to the departmental dose-optimization program which includes automated exposure  control, adjustment of the mA and/or kV according to patient size and/or use of iterative reconstruction technique. COMPARISON:  Lumbar spine MRI from 11/25/21. FINDINGS: Segmentation: 5 non rib-bearing lumbar vertebra identified. Alignment: Similar grade 1 retrolisthesis of L5 on S1. Vertebrae: The lumbar vertebral body heights are well preserved. No signs of acute fracture. The facet joints are aligned. No signs of subluxation. Paraspinal and other soft tissues: No paraspinal fluid collections. No signs of intra canal hematoma. Stone is noted within the lower pole of left kidney measuring 4 mm. Multiple left kidney cysts are identified. The largest is off the upper pole measuring 2.8 cm. Aortic atherosclerosis. Cholecystectomy. Sigmoid diverticulosis without signs of acute diverticulitis. Disc levels: Multilevel disc space narrowing and endplate spurring is noted at L2-3, L3-4, L4-5 and L5-S1. IMPRESSION: 1. No evidence for acute fracture or subluxation of the lumbar spine. 2. Multilevel degenerative disc disease. 3. Left nephrolithiasis. 4. Sigmoid diverticulosis without  signs of acute diverticulitis. 5.  Aortic Atherosclerosis (ICD10-I70.0). Electronically Signed   By: Waddell Calk M.D.   On: 01/13/2024 10:09   CT HEAD WO CONTRAST Result Date: 01/13/2024 CLINICAL DATA:  Provided history: Head trauma, moderate/severe. Poly trauma, blunt. Fall. EXAM: CT HEAD WITHOUT CONTRAST CT CERVICAL SPINE WITHOUT CONTRAST TECHNIQUE: Multidetector CT imaging of the head and cervical spine was performed following the standard protocol without intravenous contrast. Multiplanar CT image reconstructions of the cervical spine were also generated. RADIATION DOSE REDUCTION: This exam was performed according to the departmental dose-optimization program which includes automated exposure control, adjustment of the mA and/or kV according to patient size and/or use of iterative reconstruction technique. COMPARISON:  CT angiogram neck 03/03/2017. FINDINGS: CT HEAD FINDINGS Brain: Mild generalized cerebral atrophy. Patchy and ill-defined hypoattenuation within the cerebral white matter, nonspecific but compatible with mild-to-moderate chronic small vessel ischemic disease. Chronic right thalamic lacunar infarct. There is no acute intracranial hemorrhage. No demarcated cortical infarct. No extra-axial fluid collection. No evidence of an intracranial mass. No midline shift. Vascular: No hyperdense vessel.  Atherosclerotic calcifications. Skull: No calvarial fracture or aggressive osseous lesion. Sinuses/Orbits: No mass or acute finding within the imaged orbits. Moderate mucosal thickening, and small fluid level, within the right maxillary sinus. Other: Left parietal scalp hematoma and laceration. CT CERVICAL SPINE FINDINGS Alignment: 2 mm grade 1 anterolisthesis at C4-C5, C5-C6, C6-C7, C7-T1, T1-T2 and T2-T3. Skull base and vertebrae: The basion-dental and atlanto-dental intervals are maintained.No evidence of acute fracture to the cervical spine. Redemonstrated chronic fracture deformity through the  anterior superior corner of the C5 vertebral body (with small fracture fragment). This was present on the prior CTA neck of 03/03/2017. Soft tissues and spinal canal: No prevertebral fluid or swelling. No visible canal hematoma. Disc levels: Cervical spondylosis with multilevel disc space narrowing, disc bulges/central disc protrusions, uncovertebral hypertrophy and facet arthropathy. Disc space narrowing is greatest at C5-C6 (advanced at this level) and there is a posterior disc osteophyte complex at this level. No appreciable high-grade spinal canal stenosis. Multilevel bony neural foraminal narrowing. Degenerative changes also present at the C1-C2 articulation. Upper chest: No consolidation within the imaged lung apices. No visible pneumothorax. IMPRESSION: CT head: 1.  No evidence of an acute intracranial abnormality. 2. Left parietal scalp hematoma and laceration. 3. 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 4. Right maxillary sinus disease as described CT cervical spine: 1. No evidence of an acute cervical spine fracture. 2. Redemonstrated chronic fracture through anterosuperior corner of the C5 vertebral  body (with small fracture fragment). 3. Grade 1 anterolisthesis at C4-C5, C5-C6, C6-C7, C7-T1, T1-T2 and T2-T3. 4. Cervical spondylosis as described. Electronically Signed   By: Rockey Childs D.O.   On: 01/13/2024 10:08   CT CERVICAL SPINE WO CONTRAST Result Date: 01/13/2024 CLINICAL DATA:  Provided history: Head trauma, moderate/severe. Poly trauma, blunt. Fall. EXAM: CT HEAD WITHOUT CONTRAST CT CERVICAL SPINE WITHOUT CONTRAST TECHNIQUE: Multidetector CT imaging of the head and cervical spine was performed following the standard protocol without intravenous contrast. Multiplanar CT image reconstructions of the cervical spine were also generated. RADIATION DOSE REDUCTION: This exam was performed according to the departmental dose-optimization  program which includes automated exposure control, adjustment of the mA and/or kV according to patient size and/or use of iterative reconstruction technique. COMPARISON:  CT angiogram neck 03/03/2017. FINDINGS: CT HEAD FINDINGS Brain: Mild generalized cerebral atrophy. Patchy and ill-defined hypoattenuation within the cerebral white matter, nonspecific but compatible with mild-to-moderate chronic small vessel ischemic disease. Chronic right thalamic lacunar infarct. There is no acute intracranial hemorrhage. No demarcated cortical infarct. No extra-axial fluid collection. No evidence of an intracranial mass. No midline shift. Vascular: No hyperdense vessel.  Atherosclerotic calcifications. Skull: No calvarial fracture or aggressive osseous lesion. Sinuses/Orbits: No mass or acute finding within the imaged orbits. Moderate mucosal thickening, and small fluid level, within the right maxillary sinus. Other: Left parietal scalp hematoma and laceration. CT CERVICAL SPINE FINDINGS Alignment: 2 mm grade 1 anterolisthesis at C4-C5, C5-C6, C6-C7, C7-T1, T1-T2 and T2-T3. Skull base and vertebrae: The basion-dental and atlanto-dental intervals are maintained.No evidence of acute fracture to the cervical spine. Redemonstrated chronic fracture deformity through the anterior superior corner of the C5 vertebral body (with small fracture fragment). This was present on the prior CTA neck of 03/03/2017. Soft tissues and spinal canal: No prevertebral fluid or swelling. No visible canal hematoma. Disc levels: Cervical spondylosis with multilevel disc space narrowing, disc bulges/central disc protrusions, uncovertebral hypertrophy and facet arthropathy. Disc space narrowing is greatest at C5-C6 (advanced at this level) and there is a posterior disc osteophyte complex at this level. No appreciable high-grade spinal canal stenosis. Multilevel bony neural foraminal narrowing. Degenerative changes also present at the C1-C2 articulation.  Upper chest: No consolidation within the imaged lung apices. No visible pneumothorax. IMPRESSION: CT head: 1.  No evidence of an acute intracranial abnormality. 2. Left parietal scalp hematoma and laceration. 3. 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 4. Right maxillary sinus disease as described CT cervical spine: 1. No evidence of an acute cervical spine fracture. 2. Redemonstrated chronic fracture through anterosuperior corner of the C5 vertebral body (with small fracture fragment). 3. Grade 1 anterolisthesis at C4-C5, C5-C6, C6-C7, C7-T1, T1-T2 and T2-T3. 4. Cervical spondylosis as described. Electronically Signed   By: Rockey Childs D.O.   On: 01/13/2024 10:08   DG Ankle Left Port Result Date: 01/13/2024 CLINICAL DATA:  Blunt trauma.  Fell through [employer] floor EXAM: PORTABLE LEFT ANKLE - 2 VIEW COMPARISON:  None Available. FINDINGS: There is a nondisplaced fracture through the medial malleolus. There is also comminuted fracture deformity involving the calcaneus with multiple fracture lines identified posteriorly as well as within the anterior calcaneus. Fracture fragments appear to be in near anatomic alignment. IMPRESSION: 1. Nondisplaced fracture through the medial malleolus. 2. Comminuted fracture deformity of the calcaneus. Electronically Signed   By: Waddell Calk M.D.   On: 01/13/2024 09:22   DG Ankle Right Port Result Date: 01/13/2024 .  CLINICAL DATA: Blunt trauma.  Patient fell through attic floor into garage. EXAM: PORTABLE RIGHT ANKLE - 2 VIEW COMPARISON:  None Available. FINDINGS: There is a lucency through the posterior calcaneus concerning for nondisplaced fracture. Extent of fracture is suboptimally evaluated on this radiograph. There may also be fractures involving the anterior calcaneus though the fracture lines appear indistinct IMPRESSION: 1. Nondisplaced calcaneal fracture is identified. Consider further evaluation with  CT of the ankle. Electronically Signed   By: Waddell Calk M.D.   On: 01/13/2024 09:20   DG Pelvis Portable Result Date: 01/13/2024 CLINICAL DATA:  Clemens through attic into garage. EXAM: PORTABLE PELVIS 1-2 VIEWS COMPARISON:  None Available. FINDINGS: There are no signs of acute fracture or dislocation. No evidence for pelvic diastasis. Degenerative disc disease noted in the lumbar spine. IMPRESSION: 1. No acute findings. 2. Lumbar degenerative disc disease. Electronically Signed   By: Waddell Calk M.D.   On: 01/13/2024 09:16   DG Elbow 2 Views Right Result Date: 01/13/2024 CLINICAL DATA:  Trauma.  Fell through [employer]. EXAM: RIGHT ELBOW - 2 VIEW COMPARISON:  None Available. FINDINGS: There is no joint effusion. No signs of acute fracture or subluxation. No significant arthropathy. IMPRESSION: Negative. Electronically Signed   By: Waddell Calk M.D.   On: 01/13/2024 09:15   DG Elbow 2 Views Left Result Date: 01/13/2024 CLINICAL DATA:  Clemens through attic into arise.  Elbow pain. EXAM: LEFT ELBOW - 2 VIEW COMPARISON:  None Available. FINDINGS: No signs of joint effusion. No acute fracture or dislocation. No significant arthropathy. IMPRESSION: Negative. Electronically Signed   By: Waddell Calk M.D.   On: 01/13/2024 09:14   DG Chest Port 1 View Result Date: 01/13/2024 CLINICAL DATA:  Trauma.  Fell through floor of attic into arrive. EXAM: PORTABLE CHEST 1 VIEW COMPARISON:  10/11/2023 FINDINGS: Heart size and mediastinal contours are normal. Aortic atherosclerotic calcification. Pulmonary vascular congestion without frank edema. No pleural effusion or pneumothorax identified. No focal airspace consolidation. No fracture identified withinvisualized osseous structures. IMPRESSION: Pulmonary vascular congestion without frank edema. Electronically Signed   By: Waddell Calk M.D.   On: 01/13/2024 09:13    Positive ROS: All other systems have been reviewed and were otherwise negative with the  exception of those mentioned in the HPI and as above.  Physical Exam: General: Alert, no acute distress Cardiovascular: No pedal edema Respiratory: No cyanosis, no use of accessory musculature GI: No organomegaly, abdomen is soft and non-tender Skin: No lesions in the area of chief complaint Neurologic: Sensation intact distally Psychiatric: Patient is competent for consent with normal mood and affect Lymphatic: No axillary or cervical lymphadenopathy  MUSCULOSKELETAL: Bilateral lower extremities are splinted at this time.  Well-padded splint and elevated with toes warm and well-perfused with capillary refill less than 2 seconds.  No pain with passive or active stretch.  Assessment: 1.  Right calcaneus closed fracture  2.  Left calcaneus closed fracture  3.  Left hip hematoma  Plan: At this time I do believe that we would plan for nonoperative management.  We have reviewed the CT scans with the patient which show no indication for acute surgical intervention.  However, will discuss further with my partner Dr. Norleen Armor our foot and ankle specialist.  She is hopeful to not have to discharge from here to a skilled nursing facility as she does have capacity to have a medical assistant 24 hours a day at her independent living set up.  Therefore I do think  it would be useful to be admitted for pain control, strict elevation with toes above nose, as well as inpatient therapy and rehab.  She also is on anticoagulation and would need to watch for any hemoglobin drop given the left hip hematoma.  Plan for follow-up in our office in 7 to 10 days following discharge with Dr. Norleen Armor.    Selinda Belvie Gosling, MD Cell [phone]    01/13/2024 5:37 PM

## 2024-01-13 NOTE — Progress Notes (Signed)
 Orthopedic Tech Progress Note Patient Details:  Monica Williamson February 07, 1941 [id number]  Ortho Devices Type of Ortho Device: Short leg splint, Stirrup splint Ortho Device/Splint Location: BLE Ortho Device/Splint Interventions: Application   Post Interventions Patient Tolerated: Well Instructions Provided: Care of device  Takerra Lupinacci E Sherian Valenza 01/13/2024, 2:25 PM

## 2024-01-13 NOTE — ED Notes (Signed)
 OT at bedside.

## 2024-01-13 NOTE — ED Provider Notes (Signed)
 [place] EMERGENCY DEPARTMENT AT [facility] Provider Note  CSN: [id number] Arrival date & time: 01/13/24 9179  Chief Complaint(s) Fall  HPI Monica Williamson is a 83 y.o. female with past medical history as below, significant for A-fib on Eliquis , depression, diverticulosis, hypertension who presents to the ED with complaint of level 2 trauma fall  Patient was in her attic, she slipped and fell through the sheet rock, hit her arms on the way down, hit her head.  Landed on the floor below.  Pain to bilateral ankles, pain to below her elbows, pain to the back of her head.  She is anticoagulant Eliquis , last dose was yesterday evening.  No LOC.  Tetanus is up-to-date.  She has no chest pain or abdominal pain, no vomiting or dyspnea.   Past Medical History Past Medical History:  Diagnosis Date   Anxiety    Arthritis    Atrial fibrillation (HCC)    when takes Warfarin   Atrial fibrillation, currently in sinus rhythm    Bronchiectasis (HCC)    contolled with lovent and allergy med   Childhood asthma    Cholelithiasis    Colon polyps    Complication of anesthesia    Depression    Diverticulosis    Dysrhythmia    sees Dr Alveta annually for h/o Afib   H/O bronchiectasis    History of airborne allergies    uses inhalers   Hypertension    Nocturia    Osteopenia    PONV (postoperative nausea and vomiting)    severe n/v post anesthesia    Pulmonary nodule    Sleep related teeth grinding    wears a mouth guard at night   Stress fracture of tibia 2014   Vitamin D deficiency    Patient Active Problem List   Diagnosis Date Noted   Acute bronchitis due to human metapneumovirus 10/11/2023   ARF (acute renal failure) (HCC) 10/10/2023   Anxiety and depression 10/10/2023   OP (osteoporosis) 11/15/2021   Persistent atrial fibrillation (HCC)    Carotid artery disease (HCC) 08/15/2016   Post-nasal drip 03/30/2016   History of acute epiglottitis 03/30/2016   Stridor  02/28/2016   Sepsis (HCC) 02/28/2016   Epiglottitis 02/28/2016   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 Authorizing Provider  oxyCODONE -acetaminophen  (PERCOCET/ROXICET) 5-325 MG tablet Take 1 tablet by mouth every 6 (six) hours as needed for severe pain (pain score 7-10). 01/13/24  Yes Elnor Jayson DELENA, DO  albuterol  (PROVENTIL  HFA;VENTOLIN  HFA) 108 (90 Base) MCG/ACT inhaler Inhale 2 puffs into the lungs 2 (two) times daily as needed for wheezing or shortness of breath. 03/09/16   Parrett, Tammy S, NP  ALPRAZolam  (XANAX ) 0.5 MG tablet Take 0.25 mg by mouth at bedtime as needed for anxiety or sleep.     [provider]  apixaban  (ELIQUIS ) 5 MG TABS tablet Take 1 tablet (5 mg total) by mouth 2 (two) times daily. 10/05/23   Nahser, Aleene PARAS, MD  Ascorbic Acid (VITAMIN C PO) Take 1 tablet by mouth daily.  [provider]  atorvastatin  (LIPITOR) 40 MG tablet Take 40 mg by mouth daily at 6 PM.  02/16/17   [provider]  Baclofen 5 MG TABS 1 tablet as needed Orally TID as needed for 30 days 06/21/21   [provider]  buPROPion  (WELLBUTRIN  XL) 150 MG 24 hr tablet Take 150 mg by mouth every morning.  10/26/10   Nahser, Aleene PARAS, MD  cetirizine (ZYRTEC) 10 MG tablet Take 10 mg by mouth daily.    [provider]  Cholecalciferol (VITAMIN D-1000 MAX ST) 25 MCG (1000 UT) tablet Take 1,000 Units by mouth daily.    [provider]  cyclobenzaprine (FLEXERIL) 10 MG tablet Take 10 mg by mouth daily as needed  for muscle spasms. 06/18/21   [provider]  denosumab  (PROLIA ) 60 MG/ML SOSY injection Inject 60 mg into the skin every 6 (six) months. 09/05/13   [provider]  diltiazem  (DILT-XR) 180 MG 24 hr capsule Take 1 capsule (180 mg total) by mouth daily. 10/05/23   Nahser, Aleene PARAS, MD  ezetimibe  (ZETIA ) 10 MG tablet Take 10 mg by mouth daily.  03/05/18   [provider]  fluticasone  (FLONASE ) 50 MCG/ACT nasal spray Place 2 sprays into both nostrils daily. Patient taking differently: Place 2 sprays into both nostrils daily as needed for allergies. 03/31/16   Geronimo Amel, MD  fluticasone  (FLOVENT  HFA) 110 MCG/ACT inhaler Inhale 2 puffs into the lungs 2 (two) times daily as needed (for shortness of breath). 03/09/16   Parrett, Madelin RAMAN, NP  L-Lysine 1000 MG TABS Take 1 tablet by mouth as needed (cold sores). 03/19/20   [provider]  metoprolol  tartrate (LOPRESSOR ) 50 MG tablet Take 1 tablet (50 mg total) by mouth 2 (two) times daily. 03/22/23   Nahser, Aleene PARAS, MD  ondansetron  (ZOFRAN ) 4 MG tablet Take 4 mg by mouth every 6 (six) hours as needed for nausea or vomiting. 10/09/23   [provider]  pantoprazole  (PROTONIX ) 40 MG tablet Take 40 mg by mouth daily. Patient not taking: Reported on 10/09/2023 05/06/22   [provider]  psyllium (METAMUCIL) 58.6 % powder Take 1 packet by mouth daily.    [provider]  QVAR REDIHALER 40 MCG/ACT inhaler Inhale 2 puffs into the lungs 2 (two) times daily as needed (sob). 10/06/23   [provider]  Zinc 50 MG TABS 1 tablet Orally Once a day    [provider]                                                                                                                                    Past Surgical History Past Surgical History:  Procedure Laterality Date   BREAST CYST EXCISION Right    over 20 years ago   BREAST SURGERY     CARDIOVERSION N/A 06/27/2019   Procedure:  CARDIOVERSION;  Surgeon: Alveta Aleene PARAS, MD;  Location: [facility];  Service: Cardiovascular;  Laterality: N/A;   CHOLECYSTECTOMY     11/2010   INCISION AND DRAINAGE BREAST ABSCESS     JOINT REPLACEMENT Right 09/2013   knee   MENISCUS REPAIR Right 2014   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   Procedure: RIGHT TOTAL KNEE ARTHROPLASTY;  Surgeon: Marcey Raman, MD;  Location: [facility] OR;  Service: Orthopedics;  Laterality: Right;   TOTAL KNEE ARTHROPLASTY Left 02/24/2014   dr raman   TOTAL KNEE ARTHROPLASTY Left 02/24/2014   Procedure: TOTAL KNEE ARTHROPLASTY;  Surgeon: Marcey Raman, MD;  Location: [facility] OR;  Service: Orthopedics;  Laterality: Left;   VAGINAL HYSTERECTOMY     VESICOVAGINAL FISTULA CLOSURE W/ TAH     Family History Family History  Problem Relation Age of Onset   Heart disease Sister    Colon cancer Neg Hx     Social History Social History   Tobacco Use   Smoking status: Former    Current packs/day: 0.00    Average packs/day: 0.8 packs/day for 20.0 years (15.0 ttl pk-yrs)    Types: Cigarettes    Start date: 07/18/1958    Quit date: 07/18/1978    Years since quitting: 45.5   Smokeless tobacco: Never  Substance Use Topics   Drug use: No   Allergies Warfarin and related, Amoxicillin , Augmentin  [amoxicillin -pot clavulanate], and Avelox [moxifloxacin]  Review of Systems A thorough review of systems was obtained and all systems are negative except as noted in the HPI and PMH.   Physical Exam Vital Signs  I have reviewed the triage vital signs BP (!) 142/74   Pulse 94   Temp 97.7 F (36.5 C) (Oral)   Resp 15   Ht 5' 6 (1.676 m)   Wt 64.9 kg   SpO2 97%   BMI 23.08 kg/m  Physical Exam Vitals and nursing note reviewed.  Constitutional:      General: She is not in acute distress.    Appearance: Normal appearance.  HENT:     Head: Normocephalic.     Comments: Wound to occiput left    Right Ear: External ear normal.     Left Ear: External ear normal.     Nose:  Nose normal.     Mouth/Throat:     Mouth: Mucous membranes are moist.   Eyes:     General: No scleral icterus.       Right eye: No discharge.        Left eye: No discharge.    Cardiovascular:     Rate and Rhythm: Normal rate and regular rhythm.     Pulses: Normal pulses.     Heart sounds: Normal heart sounds.  Pulmonary:     Effort: Pulmonary effort is normal. No respiratory distress.     Breath sounds: Normal breath sounds. No stridor.  Abdominal:     General: Abdomen is flat. There is no distension.     Palpations: Abdomen is soft.     Tenderness: There is no abdominal tenderness.   Musculoskeletal:       Arms:     Cervical back: No rigidity.     Right lower leg: No edema.     Left lower leg: No edema.       Legs:     Comments: Hematoma bilateral elbow posterior  Pelvis stable to AP pressure  No midline spinous process tenderness to palpation or percussion, no crepitus or step-off.  Rectal tone is intact.  LE NVI, pain b/l  ankle w/ swelling No pain b/l knee       Skin:    General: Skin is warm and dry.     Capillary Refill: Capillary refill takes less than 2 seconds.   Neurological:     Mental Status: She is alert.   Psychiatric:        Mood and Affect: Mood normal.        Behavior: Behavior normal. Behavior is cooperative.     ED Results and Treatments Labs (all labs ordered are listed, but only abnormal results are displayed) Labs Reviewed  CBC WITH DIFFERENTIAL/PLATELET - Abnormal; Notable for the following components:      Result Value   WBC 14.7 (*)    MCV 101.7 (*)    Neutro Abs 12.0 (*)    All other components within normal limits  BASIC METABOLIC PANEL WITH GFR - Abnormal; Notable for the following components:   Glucose, Bld 117 (*)    GFR, Estimated 55 (*)    All other components within normal limits                                                                                                                          Radiology CT  Foot Left Wo Contrast Result Date: 01/13/2024 CLINICAL DATA:  Blunt trauma. Patient fell through attic floor into the garage. EXAM: CT OF THE LEFT FOOT WITHOUT CONTRAST TECHNIQUE: Multidetector CT imaging of the left foot was performed according to the standard protocol. Multiplanar CT image reconstructions were also generated. RADIATION DOSE REDUCTION: This exam was performed according to the departmental dose-optimization program which includes automated exposure control, adjustment of the mA and/or kV according to patient size and/or use of iterative reconstruction technique. COMPARISON:  Same day radiographs of the left ankle dated 01/13/2024. FINDINGS: Bones/Joint/Cartilage Extensively comminuted fracture of the calcaneus. There is approximately 5 mm of lateral displacement posterolaterally (series 1, image 145). Fracture margins extend through the posterior and plantar aspects of the calcaneal tuberosity, the posterior subtalar joint, the sustentaculum tali, and the anterior calcaneal process. Anterior calcaneal process fracture fragment extends into the region of the sinus tarsi. Minimally distracted fracture of the superior margin of the posterior talar process (series 6, image 29). Nondisplaced fracture of the medial malleolus (series 1, image 146). No significant clear space widening of the ankle mortise. Ligaments Ligaments are suboptimally evaluated by CT. Muscles and Tendons No intramuscular fluid collection or hematoma. Soft tissue Soft tissue swelling of the ankle and hindfoot, most pronounced posteriorly. No loculated fluid collection. No radiopaque foreign body IMPRESSION: 1. Extensively comminuted, intra-articular and displaced fracture of the left calcaneus, as above. 2. Nondisplaced fracture of the medial malleolus. 3. Minimally distracted fracture of the posterior talar process. Electronically Signed   By: Harrietta Sherry M.D.   On: 01/13/2024 13:12   CT Foot Right Wo Contrast Result Date:  01/13/2024 CLINICAL DATA:  Blunt trauma. Patient fell throughout attic floor into garage. EXAM: CT OF THE RIGHT  FOOT WITHOUT CONTRAST TECHNIQUE: Multidetector CT imaging of the right foot was performed according to the standard protocol. Multiplanar CT image reconstructions were also generated. RADIATION DOSE REDUCTION: This exam was performed according to the departmental dose-optimization program which includes automated exposure control, adjustment of the mA and/or kV according to patient size and/or use of iterative reconstruction technique. COMPARISON:  Same day radiographs of the right ankle dated 01/13/2024. FINDINGS: Bones/Joint/Cartilage Extensively comminuted fracture of the calcaneus. There is approximately 8 mm of lateral displacement posterolaterally (series 3, image 129). Fracture margins extend through the posterior and plantar aspects of the calcaneal tuberosity, the posterior subtalar joint, the sustentaculum tali, the anterior calcaneal process, as well as the articular surface at the level of the calcaneocuboid joint. Anterior calcaneal process fracture fragments extend into the level of the sinus tarsi. Tiny linear osseous fragment adjacent to the medial plantar base of the cuboid is compatible with a minimally distracted avulsion fracture (series 7, image 56). Nondisplaced fracture at the proximal dorsal margin of the medial cuneiform (series 8, image 36 and series 3, image 87). No evidence of diastasis or malalignment at the level of the Lisfranc interval. Tibiotalar joint is anatomically aligned. Ankle mortise appears congruent. Ligaments Ligaments are suboptimally evaluated by CT. Muscles and Tendons No intramuscular fluid collection or hematoma. Soft tissue Soft tissue swelling of the ankle and hindfoot, most pronounced posterolaterally. No loculated fluid collection. No radiopaque foreign body. IMPRESSION: 1. Extensively comminuted, intra-articular and displaced fracture of the right  calcaneus, as above. 2. Nondisplaced fracture at the proximal dorsal margin of the medial cuneiform. 3. Tiny linear osseous fragment adjacent to the medial plantar base of the cuboid is compatible with a minimally distracted avulsion fracture. Electronically Signed   By: Harrietta Sherry M.D.   On: 01/13/2024 13:00   CT CHEST ABDOMEN PELVIS W CONTRAST Result Date: 01/13/2024 CLINICAL DATA:  Poly trauma, blunt. EXAM: CT CHEST, ABDOMEN, AND PELVIS WITH CONTRAST TECHNIQUE: Multidetector CT imaging of the chest, abdomen and pelvis was performed following the standard protocol during bolus administration of intravenous contrast. RADIATION DOSE REDUCTION: This exam was performed according to the departmental dose-optimization program which includes automated exposure control, adjustment of the mA and/or kV according to patient size and/or use of iterative reconstruction technique. CONTRAST:  75mL OMNIPAQUE IOHEXOL 350 MG/ML SOLN COMPARISON:  CT chest 03/08/2013 FINDINGS: CT CHEST FINDINGS Cardiovascular: Atherosclerotic calcifications in thoracic aorta without aneurysm. No evidence for an aortic dissection. Heart size is normal. No significant pericardial effusion. Mediastinum/Nodes: Negative for a mediastinal hematoma. No lymph node enlargement in the mediastinum or hila. No axillary lymph node enlargement. Negative for pneumomediastinum. Esophagus is unremarkable. Lungs/Pleura: Negative for pneumothorax. Mild scarring at the lung apices. Mild bronchiectasis in the medial right upper lobe on image 77/4 and this is chronic. Small nodular densities near this bronchiectasis appears new and largest nodule measures 5 mm on image 77/4. Additional bronchiectasis and small nodular densities in the right middle lobe. These nodular densities are suggestive for post inflammatory changes based on the morphology. No pleural effusions. Peripheral nodular and tubular densities in the posterior left lower lobe, best seen on image  89/4. Elongated tubular nodular area on image 89/4 measures up to 9 mm. Multiple small nodular densities scattered throughout the left lung. Musculoskeletal: No acute bone abnormality. CT ABDOMEN PELVIS FINDINGS Hepatobiliary: Cholecystectomy. Mild intrahepatic biliary dilatation. Main portal venous system is patent. No evidence for acute injury or laceration. Pancreas: 3 mm hypodensity near the pancreatic tail appears chronic and  likely an incidental finding. No evidence for pancreatic duct dilatation or inflammation. Spleen: Normal in size without focal abnormality. Adrenals/Urinary Tract: Mild fullness of the adrenal glands appear chronic. There is probably a small right adrenal adenoma based on the prior chest CT. 3 mm calcification in left kidney lower pole. Negative for hydronephrosis. 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 No suspicious renal lesion. Evidence for renal cysts that do not require dedicated follow-up. No hydronephrosis. Normal urinary bladder. Stomach/Bowel: Moderate distention of the rectum with stool. No acute bowel inflammation. No evidence for bowel dilatation or obstruction. Vascular/Lymphatic: Aortic atherosclerosis. No enlarged abdominal or pelvic lymph nodes. Reproductive: Uterus is absent. Low-density material or fluid in the region of the right adnexa on image 100/30 is nonspecific but likely within normal limits. No suspicious adnexal mass. Other: No evidence for free fluid.  Negative for free air. Musculoskeletal: Soft tissue edema and probable hematoma along the lateral left hip at the level of the left greater trochanter. This soft tissue abnormalities are involving the subcutaneous tissues and the lateral aspect of the gluteal musculature. Small hypervascular areas in this area and cannot exclude small areas of bleeding. Both hips are located. Multilevel degenerative disc space narrowing and endplate disease in lumbar spine. No acute bone abnormality. IMPRESSION: 1.  Soft tissue edema and hematoma along the lateral left hip at the level of the left greater trochanter. This hematoma is involving the subcutaneous tissue and lateral gluteal musculature. Concern for small foci of contrast extravasation and bleeding in this area. 2. No other acute abnormality in the chest, abdomen or pelvis. 3. Chronic lung changes with multiple small nodular densities scattered throughout the lungs. Morphology is suggestive for post infectious or inflammatory changes. Largest nodular area measures up to 9 mm. Recommend follow-up chest CT in 3 months to ensure resolution. 4. Nonobstructing left renal calculus. 5. Aortic Atherosclerosis (ICD10-I70.0). These results were called by telephone at the time of interpretation on 01/13/2024 at 12:53 pm to provider JAYSON PEREYRA , who verbally acknowledged these results. Electronically Signed   By: Juliene Balder M.D.   On: 01/13/2024 12:57   CT Thoracic Spine Wo Contrast Result Date: 01/13/2024 CLINICAL DATA:  Back pain.  Trauma.  Status post fall. EXAM: CT THORACIC SPINE WITHOUT CONTRAST TECHNIQUE: Multidetector CT images of the thoracic were obtained using the standard protocol without intravenous contrast. RADIATION DOSE REDUCTION: This exam was performed according to the departmental dose-optimization program which includes automated exposure control, adjustment of the mA and/or kV according to patient size and/or use of iterative reconstruction technique. COMPARISON:  CT chest from 03/08/2013 FINDINGS: Alignment: There is upper thoracic kyphosis deformity. Vertebrae: No signs acute fracture or dislocation. Mild anterior wedging the T8 vertebra identified which appears unchanged from 03/08/2013. Paraspinal and other soft tissues: No signs hematoma within the thoracic canal. No paraspinal fluid collections identified. Trace bilateral pleural effusions. Diffuse bronchial wall thickening identified. Interlobular septal thickening identified compatible with mild  edema. Aortic atherosclerosis. Nodular thickening of bilateral adrenal glands likely reflect underlying adenomas. Disc levels: Multilevel disc space narrowing with endplate spurring. Calcified posterior disc bulge/herniation noted at T8-9 without signs of spinal stenosis. IMPRESSION: 1. No signs of acute fracture or dislocation. 2. Upper thoracic kyphosis deformity. 3. Multilevel degenerative disc disease. 4. Trace bilateral pleural effusions. 5. Diffuse bronchial wall thickening and interlobular septal thickening compatible with mild edema. 6.  Aortic Atherosclerosis (ICD10-I70.0). Electronically Signed   By: Waddell Calk M.D.   On: 01/13/2024 10:17   CT  Lumbar Spine Wo Contrast Result Date: 01/13/2024 CLINICAL DATA:  Low back pain after fall.  Trauma. EXAM: CT LUMBAR SPINE WITHOUT CONTRAST TECHNIQUE: Multidetector CT imaging of the lumbar spine was performed without intravenous contrast administration. Multiplanar CT image reconstructions were also generated. RADIATION DOSE REDUCTION: This exam was performed according to the departmental dose-optimization program which includes automated exposure control, adjustment of the mA and/or kV according to patient size and/or use of iterative reconstruction technique. COMPARISON:  Lumbar spine MRI from 11/25/21. FINDINGS: Segmentation: 5 non rib-bearing lumbar vertebra identified. Alignment: Similar grade 1 retrolisthesis of L5 on S1. Vertebrae: The lumbar vertebral body heights are well preserved. No signs of acute fracture. The facet joints are aligned. No signs of subluxation. Paraspinal and other soft tissues: No paraspinal fluid collections. No signs of intra canal hematoma. Stone is noted within the lower pole of left kidney measuring 4 mm. Multiple left kidney cysts are identified. The largest is off the upper pole measuring 2.8 cm. Aortic atherosclerosis. Cholecystectomy. Sigmoid diverticulosis without signs of acute diverticulitis. Disc levels: Multilevel  disc space narrowing and endplate spurring is noted at L2-3, L3-4, L4-5 and L5-S1. IMPRESSION: 1. No evidence for acute fracture or subluxation of the lumbar spine. 2. Multilevel degenerative disc disease. 3. Left nephrolithiasis. 4. Sigmoid diverticulosis without signs of acute diverticulitis. 5.  Aortic Atherosclerosis (ICD10-I70.0). Electronically Signed   By: Waddell Calk M.D.   On: 01/13/2024 10:09   CT HEAD WO CONTRAST Result Date: 01/13/2024 CLINICAL DATA:  Provided history: Head trauma, moderate/severe. Poly trauma, blunt. Fall. EXAM: CT HEAD WITHOUT CONTRAST CT CERVICAL SPINE WITHOUT CONTRAST TECHNIQUE: Multidetector CT imaging of the head and cervical spine was performed following the standard protocol without intravenous contrast. Multiplanar CT image reconstructions of the cervical spine were also generated. RADIATION DOSE REDUCTION: This exam was performed according to the departmental dose-optimization program which includes automated exposure control, adjustment of the mA and/or kV according to patient size and/or use of iterative reconstruction technique. COMPARISON:  CT angiogram neck 03/03/2017. FINDINGS: CT HEAD FINDINGS Brain: Mild generalized cerebral atrophy. Patchy and ill-defined hypoattenuation within the cerebral white matter, nonspecific but compatible with mild-to-moderate chronic small vessel ischemic disease. Chronic right thalamic lacunar infarct. There is no acute intracranial hemorrhage. No demarcated cortical infarct. No extra-axial fluid collection. No evidence of an intracranial mass. No midline shift. Vascular: No hyperdense vessel.  Atherosclerotic calcifications. Skull: No calvarial fracture or aggressive osseous lesion. Sinuses/Orbits: No mass or acute finding within the imaged orbits. Moderate mucosal thickening, and small fluid level, within the right maxillary sinus. Other: Left parietal scalp hematoma and laceration. CT CERVICAL SPINE FINDINGS Alignment: 2 mm grade 1  anterolisthesis at C4-C5, C5-C6, C6-C7, C7-T1, T1-T2 and T2-T3. Skull base and vertebrae: The basion-dental and atlanto-dental intervals are maintained.No evidence of acute fracture to the cervical spine. Redemonstrated chronic fracture deformity through the anterior superior corner of the C5 vertebral body (with small fracture fragment). This was present on the prior CTA neck of 03/03/2017. Soft tissues and spinal canal: No prevertebral fluid or swelling. No visible canal hematoma. Disc levels: Cervical spondylosis with multilevel disc space narrowing, disc bulges/central disc protrusions, uncovertebral hypertrophy and facet arthropathy. Disc space narrowing is greatest at C5-C6 (advanced at this level) and there is a posterior disc osteophyte complex at this level. No appreciable high-grade spinal canal stenosis. Multilevel bony neural foraminal narrowing. Degenerative changes also present at the C1-C2 articulation. Upper chest: No consolidation within the imaged lung apices. No visible pneumothorax. IMPRESSION: CT head:  1.  No evidence of an acute intracranial abnormality. 2. Left parietal scalp hematoma and laceration. 3. 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 4. Right maxillary sinus disease as described CT cervical spine: 1. No evidence of an acute cervical spine fracture. 2. Redemonstrated chronic fracture through anterosuperior corner of the C5 vertebral body (with small fracture fragment). 3. Grade 1 anterolisthesis at C4-C5, C5-C6, C6-C7, C7-T1, T1-T2 and T2-T3. 4. Cervical spondylosis as described. Electronically Signed   By: Rockey Childs D.O.   On: 01/13/2024 10:08   CT CERVICAL SPINE WO CONTRAST Result Date: 01/13/2024 CLINICAL DATA:  Provided history: Head trauma, moderate/severe. Poly trauma, blunt. Fall. EXAM: CT HEAD WITHOUT CONTRAST CT CERVICAL SPINE WITHOUT CONTRAST TECHNIQUE: Multidetector CT imaging of the head and cervical  spine was performed following the standard protocol without intravenous contrast. Multiplanar CT image reconstructions of the cervical spine were also generated. RADIATION DOSE REDUCTION: This exam was performed according to the departmental dose-optimization program which includes automated exposure control, adjustment of the mA and/or kV according to patient size and/or use of iterative reconstruction technique. COMPARISON:  CT angiogram neck 03/03/2017. FINDINGS: CT HEAD FINDINGS Brain: Mild generalized cerebral atrophy. Patchy and ill-defined hypoattenuation within the cerebral white matter, nonspecific but compatible with mild-to-moderate chronic small vessel ischemic disease. Chronic right thalamic lacunar infarct. There is no acute intracranial hemorrhage. No demarcated cortical infarct. No extra-axial fluid collection. No evidence of an intracranial mass. No midline shift. Vascular: No hyperdense vessel.  Atherosclerotic calcifications. Skull: No calvarial fracture or aggressive osseous lesion. Sinuses/Orbits: No mass or acute finding within the imaged orbits. Moderate mucosal thickening, and small fluid level, within the right maxillary sinus. Other: Left parietal scalp hematoma and laceration. CT CERVICAL SPINE FINDINGS Alignment: 2 mm grade 1 anterolisthesis at C4-C5, C5-C6, C6-C7, C7-T1, T1-T2 and T2-T3. Skull base and vertebrae: The basion-dental and atlanto-dental intervals are maintained.No evidence of acute fracture to the cervical spine. Redemonstrated chronic fracture deformity through the anterior superior corner of the C5 vertebral body (with small fracture fragment). This was present on the prior CTA neck of 03/03/2017. Soft tissues and spinal canal: No prevertebral fluid or swelling. No visible canal hematoma. Disc levels: Cervical spondylosis with multilevel disc space narrowing, disc bulges/central disc protrusions, uncovertebral hypertrophy and facet arthropathy. Disc space narrowing is  greatest at C5-C6 (advanced at this level) and there is a posterior disc osteophyte complex at this level. No appreciable high-grade spinal canal stenosis. Multilevel bony neural foraminal narrowing. Degenerative changes also present at the C1-C2 articulation. Upper chest: No consolidation within the imaged lung apices. No visible pneumothorax. IMPRESSION: CT head: 1.  No evidence of an acute intracranial abnormality. 2. Left parietal scalp hematoma and laceration. 3. 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 4. Right maxillary sinus disease as described CT cervical spine: 1. No evidence of an acute cervical spine fracture. 2. Redemonstrated chronic fracture through anterosuperior corner of the C5 vertebral body (with small fracture fragment). 3. Grade 1 anterolisthesis at C4-C5, C5-C6, C6-C7, C7-T1, T1-T2 and T2-T3. 4. Cervical spondylosis as described. Electronically Signed   By: Rockey Childs D.O.   On: 01/13/2024 10:08   DG Ankle Left Port Result Date: 01/13/2024 CLINICAL DATA:  Blunt trauma.  Fell through [employer] floor EXAM: PORTABLE LEFT ANKLE - 2 VIEW COMPARISON:  None Available. FINDINGS: There is a nondisplaced fracture through the medial malleolus. There is also comminuted fracture deformity involving the calcaneus  with multiple fracture lines identified posteriorly as well as within the anterior calcaneus. Fracture fragments appear to be in near anatomic alignment. IMPRESSION: 1. Nondisplaced fracture through the medial malleolus. 2. Comminuted fracture deformity of the calcaneus. Electronically Signed   By: Waddell Calk M.D.   On: 01/13/2024 09:22   DG Ankle Right Port Result Date: 01/13/2024 .  CLINICAL DATA: Blunt trauma.  Patient fell through attic floor into garage. EXAM: PORTABLE RIGHT ANKLE - 2 VIEW COMPARISON:  None Available. FINDINGS: There is a lucency through the posterior calcaneus concerning for nondisplaced fracture.  Extent of fracture is suboptimally evaluated on this radiograph. There may also be fractures involving the anterior calcaneus though the fracture lines appear indistinct IMPRESSION: 1. Nondisplaced calcaneal fracture is identified. Consider further evaluation with CT of the ankle. Electronically Signed   By: Waddell Calk M.D.   On: 01/13/2024 09:20   DG Pelvis Portable Result Date: 01/13/2024 CLINICAL DATA:  Clemens through attic into garage. EXAM: PORTABLE PELVIS 1-2 VIEWS COMPARISON:  None Available. FINDINGS: There are no signs of acute fracture or dislocation. No evidence for pelvic diastasis. Degenerative disc disease noted in the lumbar spine. IMPRESSION: 1. No acute findings. 2. Lumbar degenerative disc disease. Electronically Signed   By: Waddell Calk M.D.   On: 01/13/2024 09:16   DG Elbow 2 Views Right Result Date: 01/13/2024 CLINICAL DATA:  Trauma.  Fell through [employer]. EXAM: RIGHT ELBOW - 2 VIEW COMPARISON:  None Available. FINDINGS: There is no joint effusion. No signs of acute fracture or subluxation. No significant arthropathy. IMPRESSION: Negative. Electronically Signed   By: Waddell Calk M.D.   On: 01/13/2024 09:15   DG Elbow 2 Views Left Result Date: 01/13/2024 CLINICAL DATA:  Clemens through attic into arise.  Elbow pain. EXAM: LEFT ELBOW - 2 VIEW COMPARISON:  None Available. FINDINGS: No signs of joint effusion. No acute fracture or dislocation. No significant arthropathy. IMPRESSION: Negative. Electronically Signed   By: Waddell Calk M.D.   On: 01/13/2024 09:14   DG Chest Port 1 View Result Date: 01/13/2024 CLINICAL DATA:  Trauma.  Fell through floor of attic into arrive. EXAM: PORTABLE CHEST 1 VIEW COMPARISON:  10/11/2023 FINDINGS: Heart size and mediastinal contours are normal. Aortic atherosclerotic calcification. Pulmonary vascular congestion without frank edema. No pleural effusion or pneumothorax identified. No focal airspace consolidation. No fracture identified  withinvisualized osseous structures. IMPRESSION: Pulmonary vascular congestion without frank edema. Electronically Signed   By: Waddell Calk M.D.   On: 01/13/2024 09:13    Pertinent labs & imaging results that were available during my care of the patient were reviewed by me and considered in my medical decision making (see MDM for details).  Medications Ordered in ED Medications  HYDROcodone -acetaminophen  (NORCO/VICODIN) 5-325 MG per tablet 1 tablet (1 tablet Oral Given 01/13/24 0904)  ondansetron  (ZOFRAN ) injection 4 mg (4 mg Intravenous Given 01/13/24 1130)  sodium chloride  0.9 % bolus 1,000 mL (0 mLs Intravenous Stopped 01/13/24 1507)  iohexol (OMNIPAQUE) 350 MG/ML injection 75 mL (75 mLs Intravenous Contrast Given 01/13/24 1216)  Procedures Procedures  (including critical care time)  Medical Decision Making / ED Course    Medical Decision Making:    KRISTIE BRACEWELL is a 83 y.o. female with past medical history as below, significant for A-fib on Eliquis , depression, diverticulosis, hypertension who presents to the ED with complaint of level 2 trauma fall. The complaint involves an extensive differential diagnosis and also carries with it a high risk of complications and morbidity.  Serious etiology was considered. Ddx includes but is not limited to: Differential diagnoses for head trauma includes subdural hematoma, epidural hematoma, acute concussion, traumatic subarachnoid hemorrhage, cerebral contusions, fracture, dislocation, sprain, strain, contusion etc.   Complete initial physical exam performed, notably the patient was in no acute distress, HDS.    Reviewed and confirmed nursing documentation for past medical history, family history, social history.  Vital signs reviewed.     Brief summary:  83 year old female anticoagulated on Eliquis  here  with level 2 trauma, fall through ceiling.  Head injury. Fell through the ceiling, hit her head and arms when falling through the ceiling.  Landed on her feet.  Pain to bilateral ankles. Primary survey was completed, airway is intact, trachea is midline, clear breath sounds bilateral, no increased work of breathing, equal pulses all 4 extremities with warm well-perfused extremities.  GCS 15, ANO x 3, pupils 4 mm bilateral. >> Hematoma with wound to occiput left Pain to bilateral elbows with hematoma, pain to bilateral ankles   Clinical Course as of 01/13/24 1550  Sat Jan 13, 2024  1048 CT reviewed > chronic c5 fx   [SG]  1048 Ankle left xr w/ calcaneus fx and medial mal fx Ankle right xr w/ calcaneus fx [SG]  1049 WBC(!): 14.7 Likely 2/2 trauma, no fever, doubt sepsis [SG]  1050 Will consult ortho b/l calcaneus fx, left medial mal fx [SG]  1058 Lovenox x6 wks vs continue doac  [SG]  1059 Spoke w/ dr sharl f/u x1 wk > ct b/l ankle  [SG]  1144 Spoke with CM, wheelchair ordered, F2F signed  [SG]  1326 CT w/ b/l calcaneous fx; will place in splint CT CAP also shows small foci of bleeding along left hip [SG]  1414 Pt unable to go home 2/2 NWB b/l LE status, will see about SNF placement [SG]    Clinical Course User Index [SG] Elnor Savant A, DO   CT imaging does show a small amount of extra visitation on her left hip, she has no significant pain here.  Does have a hematoma.  Compartments are soft.  Patient is unable to ambulate secondary to nonweightbearing status, spouse at home is unable to perform patient's full assist due to his age and other debilities.  Spoke with case management who will see if we get patient placed into a SNF/short-term rehab.  She was splinted per orthopedic recommendations and will have her follow-up orthopedics in about a week for recheck and likely casting.  Handoff to Dr Patsey, pt is currently receiving PT eval but needs eval for her scalp lac/?repair if  necessary              Additional history obtained: -Additional history obtained from family -External records from outside source obtained and reviewed including: Chart review including previous notes, labs, imaging, consultation notes including  Prior admission, home medications, prior labs   Lab Tests: -I ordered, reviewed, and interpreted labs.   The pertinent results include:   Labs Reviewed  CBC WITH DIFFERENTIAL/PLATELET - Abnormal; Notable for  the following components:      Result Value   WBC 14.7 (*)    MCV 101.7 (*)    Neutro Abs 12.0 (*)    All other components within normal limits  BASIC METABOLIC PANEL WITH GFR - Abnormal; Notable for the following components:   Glucose, Bld 117 (*)    GFR, Estimated 55 (*)    All other components within normal limits    Notable for mild leukocytosis likely secondary to trauma no evidence of infection at this time  EKG   EKG Interpretation Date/Time:  Saturday January 13 2024 08:28:06 EDT Ventricular Rate:  82 PR Interval:    QRS Duration:  131 QT Interval:  420 QTC Calculation: 491 R Axis:   83  Text Interpretation: Atrial fibrillation Right bundle branch block Confirmed by Elnor Savant ([id number]) on 01/13/2024 8:55:23 AM         Imaging Studies ordered: I ordered imaging studies including trauma scan, x-rays of extremities I independently visualized the following imaging with scope of interpretation limited to determining acute life threatening conditions related to emergency care; findings noted above I agree with the radiologist interpretation If any imaging was obtained with contrast I closely monitored patient for any possible adverse reaction a/w contrast administration in the emergency department   Medicines ordered and prescription drug management: Meds ordered this encounter  Medications   HYDROcodone -acetaminophen  (NORCO/VICODIN) 5-325 MG per tablet 1 tablet    Refill:  0   ondansetron  (ZOFRAN )  injection 4 mg   sodium chloride  0.9 % bolus 1,000 mL   iohexol (OMNIPAQUE) 350 MG/ML injection 75 mL   oxyCODONE -acetaminophen  (PERCOCET/ROXICET) 5-325 MG tablet    Sig: Take 1 tablet by mouth every 6 (six) hours as needed for severe pain (pain score 7-10).    Dispense:  15 tablet    Refill:  0    -I have reviewed the patients home medicines and have made adjustments as needed   Consultations Obtained: I requested consultation with the dr sharl ortho,  and discussed lab and imaging findings as well as pertinent plan - they recommend: splint b/l, office in 1 wk   Cardiac Monitoring: The patient was maintained on a cardiac monitor.  I personally viewed and interpreted the cardiac monitored which showed an underlying rhythm of: A-fib Continuous pulse oximetry interpreted by myself, 100% on ra.    Social Determinants of Health:  Diagnosis or treatment significantly limited by social determinants of health: former smoker   Reevaluation: After the interventions noted above, I reevaluated the patient and found that they have improved  Co morbidities that complicate the patient evaluation  Past Medical History:  Diagnosis Date   Anxiety    Arthritis    Atrial fibrillation (HCC)    when takes Warfarin   Atrial fibrillation, currently in sinus rhythm    Bronchiectasis (HCC)    contolled with lovent and allergy med   Childhood asthma    Cholelithiasis    Colon polyps    Complication of anesthesia    Depression    Diverticulosis    Dysrhythmia    sees Dr Alveta annually for h/o Afib   H/O bronchiectasis    History of airborne allergies    uses inhalers   Hypertension    Nocturia    Osteopenia    PONV (postoperative nausea and vomiting)    severe n/v post anesthesia    Pulmonary nodule    Sleep related teeth grinding    wears a mouth guard  at night   Stress fracture of tibia 2014   Vitamin D deficiency       Dispostion: Disposition decision including need for  hospitalization was considered, and patient disposition pending at time of sign out.    Final Clinical Impression(s) / ED Diagnoses Final diagnoses:  Closed fracture of both calcanei, initial encounter  Nondisplaced fracture of medial malleolus of left tibia, initial encounter for closed fracture  Fall in home, initial encounter        Elnor Jayson LABOR, DO 01/13/24 1550

## 2024-01-13 NOTE — H&P (Signed)
 History and Physical    Patient: Monica Williamson FMW:[id number] DOB: 03/26/1941 DOA: 01/13/2024 DOS: the patient was seen and examined on 01/13/2024 PCP: [name]., MD  Patient coming from: Home  Chief Complaint:  Chief Complaint  Patient presents with   Fall   HPI: Monica Williamson is a 83 y.o. female with medical history significant of atrial fibrillation, anxiety disorder, depression, essential hypertension, bronchiectasis, osteopenia, who sustained a fall through her attic and was brought to the ER with severe pain in her legs and her back.  She apparently was in the act over the garage when she missed a step and fell between the support beams of the roof on the ceiling.  She went through and landed on her husband's car in the garage.  Patient landed on her bilateral feet.  Workup in the ER showed bilateral calcaneus fractures.  Other imaging through the lumbar thoracic and cervical spine as well as bilateral hip showed no injury except left hip hematoma.  Patient was seen by orthopedic surgery.  Initial plan is for placement from the ER but due to severe pain which is uncontrolled, orthopedics recommended medical admission for pain management.  Physical therapy and Occupational Therapy evaluation and further disposition.  Review of Systems: As mentioned in the history of present illness. All other systems reviewed and are negative. Past Medical History:  Diagnosis Date   Anxiety    Arthritis    Atrial fibrillation (HCC)    when takes Warfarin   Atrial fibrillation, currently in sinus rhythm    Bronchiectasis (HCC)    contolled with lovent and allergy med   Childhood asthma    Cholelithiasis    Colon polyps    Complication of anesthesia    Depression    Diverticulosis    Dysrhythmia    sees Dr Alveta annually for h/o Afib   H/O bronchiectasis    History of airborne allergies    uses inhalers   Hypertension    Nocturia    Osteopenia    PONV (postoperative  nausea and vomiting)    severe n/v post anesthesia    Pulmonary nodule    Sleep related teeth grinding    wears a mouth guard at night   Stress fracture of tibia 2014   Vitamin D deficiency    Past Surgical History:  Procedure Laterality Date   BREAST CYST EXCISION Right    over 20 years ago   BREAST SURGERY     CARDIOVERSION N/A 06/27/2019   Procedure: CARDIOVERSION;  Surgeon: Nahser, Aleene PARAS, MD;  Location: [facility] ENDOSCOPY;  Service: Cardiovascular;  Laterality: N/A;   CHOLECYSTECTOMY     11/2010   INCISION AND DRAINAGE BREAST ABSCESS     JOINT REPLACEMENT Right 09/2013   knee   MENISCUS REPAIR Right 2014   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 Her smoking use included cigarettes. She started smoking about 65 years ago. She has a 15 pack-year smoking history. She  has never used smokeless tobacco. She reports that she does not use drugs. No history on file for alcohol  use.  Allergies  Allergen Reactions   Warfarin And Related Hives and Itching   Amoxicillin  Itching   Augmentin  [Amoxicillin -Pot Clavulanate] Rash   Avelox [Moxifloxacin] Diarrhea    Family History  Problem Relation Age of Onset   Heart disease Sister    Colon cancer Neg Hx     Prior to Admission medications   Medication Sig Start Date End Date Taking? Authorizing Provider  albuterol  (PROVENTIL  HFA;VENTOLIN  HFA) 108 (90 Base) MCG/ACT inhaler Inhale 2 puffs into the lungs 2 (two) times daily as needed for wheezing or shortness of  breath. 03/09/16  Yes Parrett, Tammy S, NP  ALPRAZolam  (XANAX ) 0.5 MG tablet Take 0.25 mg by mouth at bedtime as needed for anxiety or sleep.    Yes [provider]  apixaban  (ELIQUIS ) 5 MG TABS tablet Take 1 tablet (5 mg total) by mouth 2 (two) times daily. 10/05/23  Yes Nahser, Aleene PARAS, MD  atorvastatin  (LIPITOR) 40 MG tablet Take 40 mg by mouth daily at 6 PM.  02/16/17  Yes [provider]  buPROPion  (WELLBUTRIN  XL) 150 MG 24 hr tablet Take 150 mg by mouth every morning.  10/26/10  Yes Nahser, Aleene PARAS, MD  cetirizine (ZYRTEC) 10 MG tablet Take 10 mg by mouth daily.   Yes [provider]  denosumab  (PROLIA ) 60 MG/ML SOSY injection Inject 60 mg into the skin every 6 (six) months. 09/05/13  Yes [provider]  diltiazem  (DILT-XR) 180 MG 24 hr capsule Take 1 capsule (180 mg total) by mouth daily. 10/05/23  Yes Nahser, Aleene PARAS, MD  ezetimibe  (ZETIA ) 10 MG tablet Take 10 mg by mouth daily.  03/05/18  Yes [provider]  fluticasone  (FLONASE ) 50 MCG/ACT nasal spray Place 2 sprays into both nostrils daily. Patient taking differently: Place 2 sprays into both nostrils daily as needed for allergies. 03/31/16  Yes Geronimo Amel, MD  fluticasone  (FLOVENT  HFA) 110 MCG/ACT inhaler Inhale 2 puffs into the lungs 2 (two) times daily as needed (for shortness of breath). 03/09/16  Yes Parrett, Tammy S, NP  metoprolol  tartrate (LOPRESSOR ) 50 MG tablet Take 1 tablet (50 mg total) by mouth 2 (two) times daily. 03/22/23  Yes Nahser, Aleene PARAS, MD  Multiple Vitamin (MULTIVITAMIN WITH MINERALS) TABS tablet Take 1 tablet by mouth daily.   Yes [provider]  ondansetron  (ZOFRAN ) 4 MG tablet Take 4 mg by mouth every 6 (six) hours as needed for nausea or vomiting. 10/09/23  Yes [provider]  oxyCODONE -acetaminophen  (PERCOCET/ROXICET) 5-325 MG tablet Take 1 tablet by mouth every 6 (six) hours as needed for severe pain (pain score 7-10). 01/13/24  Yes Elnor Savant A,  DO  psyllium (METAMUCIL) 58.6 % powder Take 1 packet by mouth daily.   Yes [provider]  QVAR REDIHALER 40 MCG/ACT inhaler Inhale 2 puffs into the lungs 2 (two) times daily as needed (sob). 10/06/23  Yes [provider]  Zinc 50 MG TABS 1 tablet Orally Once a day   Yes [provider]  Cholecalciferol (VITAMIN D-1000 MAX ST) 25 MCG (1000 UT) tablet Take 1,000 Units by mouth daily.    [provider]    Physical Exam: Vitals:   01/13/24 1311 01/13/24 1345 01/13/24 1645 01/13/24 1718  BP:  (!) 142/74 (!) 155/91   Pulse:  94 88   Resp:  15 15   Temp: 97.7 F (36.5  C)   98.2 F (36.8 C)  TempSrc: Oral     SpO2:  97% 98%   Weight:      Height:       Constitutional: Acutely ill looking no distress NAD, calm, comfortable Eyes: PERRL, lids and conjunctivae normal ENMT: Mucous membranes are moist. Posterior pharynx clear of any exudate or lesions.Normal dentition.  Neck: normal, supple, no masses, no thyromegaly Respiratory: clear to auscultation bilaterally, no wheezing, no crackles. Normal respiratory effort. No accessory muscle use.  Cardiovascular: Regular rate and rhythm, no murmurs / rubs / gallops. No extremity edema. 2+ pedal pulses. No carotid bruits.  Abdomen: no tenderness, no masses palpated. No hepatosplenomegaly. Bowel sounds positive.  Musculoskeletal: Bilateral feet wrapped, tenderness, pain with little mobility,, Skin: no rashes, lesions, ulcers. No induration Neurologic: CN 2-12 grossly intact. Sensation intact, DTR normal. Strength 5/5 in all 4.  Psychiatric: Normal judgment and insight. Alert and oriented x 3. Normal mood  Data Reviewed:  Temperature 90.2, heart rate 104, blood pressure 76/49, BMP within normal, white count is 14.7, multiple CTs and x-rays performed.  CT of the feet showed bilateral calcaneus fracture.  Also extensive hematoma along the lateral Aspect of the left trochanter  Assessment and Plan:  #1 status post  fall: Patient has a fall due to missing her step and falling from the attic to the beams.  At this point she has multiple fractures of the calcaneus but also pain as well as generalized body aches from the fall.  Patient is being admitted for pain management.  Also physical therapy and Occupational Therapy.  Possibility of placement.  #2 bilateral calcaneus fractures: Seen by orthopedics.  At this point no surgery required.  Mainly pain management and physical therapy.  May require skilled facility placement.  Patient is in severe pain did not respond to morphine well.  Starting Dilaudid .  Oral agents also will be added later.  #3 paroxysmal atrial fibrillation: Patient is on Eliquis  as well as rate control.  No evidence of bleed.  Will resume home regimen SCDs.  #4 anxiety with depression: Confirm on resume home regimen.   Advance Care Planning:   Code Status: Full Code   Consults: Orthopedics Dr. Sharl  Family Communication: No family at bedside  Severity of Illness: The appropriate patient status for this patient is OBSERVATION. Observation status is judged to be reasonable and necessary in order to provide the required intensity of service to ensure the patient's safety. The patient's presenting symptoms, physical exam findings, and initial radiographic and laboratory data in the context of their medical condition is felt to place them at decreased risk for further clinical deterioration. Furthermore, it is anticipated that the patient will be medically stable for discharge from the hospital within 2 midnights of admission.   AuthorBETHA SIM KNOLL, MD 01/13/2024 7:04 PM  For on call review [email or web address].

## 2024-01-13 NOTE — [place] FL2 (Signed)
 [place]  MEDICAID FL2 LEVEL OF CARE FORM     IDENTIFICATION  Patient Name: Monica Williamson Birthdate: [birth year] Sex: female Admission Date (Current Location): 01/13/2024  [facility] and [place] Number:  Producer, television/film/video and Address:  [facility] [place], [address], [place], [place] [address]      Provider Number: [id number]  Attending Physician Name and Address:  Elnor Jayson DELENA, DO  Relative Name and Phone Number:       Current Level of Care: Hospital Recommended Level of Care: Skilled Nursing Facility Prior Approval Number:    Date Approved/Denied:   PASRR Number: [id number]  Discharge Plan: SNF    Current Diagnoses: Patient Active Problem List   Diagnosis Date Noted   Acute bronchitis due to human metapneumovirus 10/11/2023   ARF (acute renal failure) (HCC) 10/10/2023   Anxiety and depression 10/10/2023   OP (osteoporosis) 11/15/2021   Persistent atrial fibrillation (HCC)    Carotid artery disease (HCC) 08/15/2016   Post-nasal drip 03/30/2016   History of acute epiglottitis 03/30/2016   Stridor 02/28/2016   Sepsis (HCC) 02/28/2016   Epiglottitis 02/28/2016   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   Long term (current) use of anticoagulants 10/01/2012   PAF (paroxysmal atrial fibrillation) (HCC) 09/25/2012   Chest pain 10/26/2010   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   BRONCHIECTASIS 08/13/2007   Allergic rhinitis 05/12/2007   Extrinsic asthma 05/12/2007   ABSCESS, BREAST 05/12/2007   Chronic cough 05/12/2007   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    Orientation RESPIRATION BLADDER Height & Weight     Self, Time, Situation, Place  Normal Continent Weight: 143 lb (64.9 kg) Height:  5' 6 (167.6 cm)  BEHAVIORAL SYMPTOMS/MOOD NEUROLOGICAL BOWEL NUTRITION STATUS      Continent Diet (Regular diet)  AMBULATORY STATUS COMMUNICATION OF NEEDS Skin   Extensive Assist Verbally Normal, Skin abrasions                        Personal Care Assistance Level of Assistance  Bathing, Feeding, Dressing Bathing Assistance: Limited assistance Feeding assistance: Independent Dressing Assistance: Limited assistance     Functional Limitations Info  Sight, Hearing, Speech Sight Info: Adequate Hearing Info: Adequate Speech Info: Adequate    SPECIAL CARE FACTORS FREQUENCY  PT (By licensed PT), OT (By licensed OT)     PT Frequency: 5x weekly OT Frequency: 5x weekly            Contractures Contractures Info: Not present    Additional Factors Info  Code Status, Allergies Code Status Info: Full Code Allergies Info: Warfarin And Related  Amoxicillin   Augmentin  (Amoxicillin -pot Clavulanate)  Avelox (Moxifloxacin)           Current Medications (01/13/2024):  This is the current hospital active medication list No current facility-administered medications for this encounter.   Current Outpatient Medications  Medication Sig Dispense Refill   acetaminophen  (TYLENOL ) 500 MG tablet Take 1,000 mg by mouth every 6 (six) hours as needed for moderate pain or headache.     albuterol  (PROVENTIL  HFA;VENTOLIN  HFA) 108 (90 Base) MCG/ACT inhaler Inhale 2 puffs into the lungs 2 (two) times daily as needed for wheezing or shortness of breath. 1 Inhaler 5   ALPRAZolam  (XANAX ) 0.5 MG tablet Take 0.25 mg by mouth at bedtime as needed for anxiety or sleep.      apixaban  (ELIQUIS ) 5 MG TABS tablet Take 1 tablet (5 mg total) by mouth 2 (two) times daily. 180  tablet 1   Ascorbic Acid (VITAMIN C PO) Take 1 tablet by mouth daily.     atorvastatin  (LIPITOR) 40 MG tablet Take 40 mg by mouth daily at 6 PM.      Baclofen 5 MG TABS 1 tablet as needed Orally TID as needed for 30 days     buPROPion  (WELLBUTRIN  XL) 150 MG 24 hr tablet Take 150 mg by mouth every morning.  30 tablet 11   cetirizine (ZYRTEC) 10 MG tablet Take 10 mg by mouth daily.     Cholecalciferol (VITAMIN D-1000 MAX ST) 25 MCG (1000 UT) tablet Take 1,000 Units by mouth  daily.     cyclobenzaprine (FLEXERIL) 10 MG tablet Take 10 mg by mouth daily as needed for muscle spasms.     denosumab  (PROLIA ) 60 MG/ML SOSY injection Inject 60 mg into the skin every 6 (six) months.     diltiazem  (DILT-XR) 180 MG 24 hr capsule Take 1 capsule (180 mg total) by mouth daily. 90 capsule 2   ezetimibe  (ZETIA ) 10 MG tablet Take 10 mg by mouth daily.      fluticasone  (FLONASE ) 50 MCG/ACT nasal spray Place 2 sprays into both nostrils daily. (Patient taking differently: Place 2 sprays into both nostrils daily as needed for allergies.) 16 g 5   fluticasone  (FLOVENT  HFA) 110 MCG/ACT inhaler Inhale 2 puffs into the lungs 2 (two) times daily as needed (for shortness of breath). 1 Inhaler 5   L-Lysine 1000 MG TABS Take 1 tablet by mouth as needed (cold sores).     metoprolol  tartrate (LOPRESSOR ) 50 MG tablet Take 1 tablet (50 mg total) by mouth 2 (two) times daily. 60 tablet 0   ondansetron  (ZOFRAN ) 4 MG tablet Take 4 mg by mouth every 6 (six) hours as needed for nausea or vomiting.     pantoprazole  (PROTONIX ) 40 MG tablet Take 40 mg by mouth daily. (Patient not taking: Reported on 10/09/2023)     psyllium (METAMUCIL) 58.6 % powder Take 1 packet by mouth daily.     QVAR REDIHALER 40 MCG/ACT inhaler Inhale 2 puffs into the lungs 2 (two) times daily as needed (sob).     Zinc 50 MG TABS 1 tablet Orally Once a day       Discharge Medications: Please see discharge summary for a list of discharge medications.  Relevant Imaging Results:  Relevant Lab Results:   Additional Information SSN: [id number]  Niels LITTIE Portugal, LCSW

## 2024-01-14 DIAGNOSIS — W19XXXS Unspecified fall, sequela: Secondary | ICD-10-CM | POA: Diagnosis not present

## 2024-01-14 DIAGNOSIS — S92001A Unspecified fracture of right calcaneus, initial encounter for closed fracture: Secondary | ICD-10-CM

## 2024-01-14 DIAGNOSIS — S8255XA Nondisplaced fracture of medial malleolus of left tibia, initial encounter for closed fracture: Secondary | ICD-10-CM | POA: Diagnosis not present

## 2024-01-14 DIAGNOSIS — S92002A Unspecified fracture of left calcaneus, initial encounter for closed fracture: Secondary | ICD-10-CM | POA: Diagnosis not present

## 2024-01-14 LAB — COMPREHENSIVE METABOLIC PANEL WITH GFR
ALT: 301 U/L — ABNORMAL HIGH (ref 0–44)
AST: 370 U/L — ABNORMAL HIGH (ref 15–41)
Albumin: 3.1 g/dL — ABNORMAL LOW (ref 3.5–5.0)
Alkaline Phosphatase: 158 U/L — ABNORMAL HIGH (ref 38–126)
Anion gap: 8 (ref 5–15)
BUN: 14 mg/dL (ref 8–23)
CO2: 26 mmol/L (ref 22–32)
Calcium: 8.1 mg/dL — ABNORMAL LOW (ref 8.9–10.3)
Chloride: 105 mmol/L (ref 98–111)
Creatinine, Ser: 1.02 mg/dL — ABNORMAL HIGH (ref 0.44–1.00)
GFR, Estimated: 54 mL/min — ABNORMAL LOW (ref >60)
Glucose, Bld: 113 mg/dL — ABNORMAL HIGH (ref 70–99)
Potassium: 4.6 mmol/L (ref 3.5–5.1)
Sodium: 139 mmol/L (ref 135–145)
Total Bilirubin: 1.1 mg/dL (ref 0.0–1.2)
Total Protein: 5.4 g/dL — ABNORMAL LOW (ref 6.5–8.1)

## 2024-01-14 LAB — CBC
HCT: 34.2 % — ABNORMAL LOW (ref 36.0–46.0)
Hemoglobin: 10.9 g/dL — ABNORMAL LOW (ref 12.0–15.0)
MCH: 32.7 pg (ref 26.0–34.0)
MCHC: 31.9 g/dL (ref 30.0–36.0)
MCV: 102.7 fL — ABNORMAL HIGH (ref 80.0–100.0)
Platelets: 217 10*3/uL (ref 150–400)
RBC: 3.33 MIL/uL — ABNORMAL LOW (ref 3.87–5.11)
RDW: 15.6 % — ABNORMAL HIGH (ref 11.5–15.5)
WBC: 8.9 10*3/uL (ref 4.0–10.5)
nRBC: 0 % (ref 0.0–0.2)

## 2024-01-14 MED ORDER — ATORVASTATIN CALCIUM 40 MG PO TABS: 40.0000 mg | ORAL_TABLET | Freq: Every day | ORAL | Status: DC

## 2024-01-14 MED ORDER — ONDANSETRON HCL 4 MG PO TABS: 4.0000 mg | ORAL_TABLET | Freq: Four times a day (QID) | ORAL | Status: DC | PRN

## 2024-01-14 MED ORDER — APIXABAN 5 MG PO TABS
5.0000 mg | ORAL_TABLET | Freq: Two times a day (BID) | ORAL | Status: DC
  Administered 2024-01-14 – 2024-01-17 (×7): 5 mg via ORAL
  Filled 2024-01-14 (×5): qty 1
  Filled 2024-01-14: qty 2
  Filled 2024-01-14: qty 1

## 2024-01-14 MED ORDER — HYDROMORPHONE HCL 1 MG/ML IJ SOLN
1.0000 mg | INTRAMUSCULAR | Status: DC | PRN
  Administered 2024-01-14 – 2024-01-15 (×3): 1 mg via INTRAVENOUS
  Filled 2024-01-14 (×4): qty 1

## 2024-01-14 MED ORDER — LACTATED RINGERS IV SOLN: INTRAVENOUS | Status: AC

## 2024-01-14 MED ORDER — SENNA 8.6 MG PO TABS
1.0000 | ORAL_TABLET | Freq: Every day | ORAL | Status: DC
  Administered 2024-01-14: 8.6 mg via ORAL
  Filled 2024-01-14: qty 1

## 2024-01-14 MED ORDER — DILTIAZEM HCL ER COATED BEADS 180 MG PO CP24
180.0000 mg | ORAL_CAPSULE | Freq: Every day | ORAL | Status: DC
  Administered 2024-01-14 – 2024-01-17 (×4): 180 mg via ORAL
  Filled 2024-01-14 (×5): qty 1

## 2024-01-14 MED ORDER — FLUTICASONE PROPIONATE HFA 110 MCG/ACT IN AERO: 2.0000 | INHALATION_SPRAY | Freq: Two times a day (BID) | RESPIRATORY_TRACT | Status: DC | PRN

## 2024-01-14 MED ORDER — NUTRISOURCE FIBER PO PACK
PACK | Freq: Every day | ORAL | Status: DC
  Administered 2024-01-15 – 2024-01-17 (×3): 1 via ORAL
  Filled 2024-01-14 (×3): qty 1

## 2024-01-14 MED ORDER — EZETIMIBE 10 MG PO TABS
10.0000 mg | ORAL_TABLET | Freq: Every day | ORAL | Status: DC
  Administered 2024-01-14: 10 mg via ORAL
  Filled 2024-01-14: qty 1

## 2024-01-14 MED ORDER — BUPROPION HCL ER (XL) 150 MG PO TB24
150.0000 mg | ORAL_TABLET | Freq: Every morning | ORAL | Status: DC
  Administered 2024-01-14 – 2024-01-17 (×4): 150 mg via ORAL
  Filled 2024-01-14 (×4): qty 1

## 2024-01-14 MED ORDER — LORATADINE 10 MG PO TABS
10.0000 mg | ORAL_TABLET | Freq: Every day | ORAL | Status: DC
  Administered 2024-01-14 – 2024-01-17 (×4): 10 mg via ORAL
  Filled 2024-01-14 (×4): qty 1

## 2024-01-14 MED ORDER — FLUTICASONE PROPIONATE 50 MCG/ACT NA SUSP: 2.0000 | Freq: Every day | NASAL | Status: DC | PRN

## 2024-01-14 MED ORDER — VITAMIN D 25 MCG (1000 UNIT) PO TABS
1000.0000 [IU] | ORAL_TABLET | Freq: Every day | ORAL | Status: DC
  Administered 2024-01-14 – 2024-01-17 (×4): 1000 [IU] via ORAL
  Filled 2024-01-14 (×4): qty 1

## 2024-01-14 MED ORDER — ADULT MULTIVITAMIN W/MINERALS CH
1.0000 | ORAL_TABLET | Freq: Every day | ORAL | Status: DC
  Administered 2024-01-14 – 2024-01-17 (×4): 1 via ORAL
  Filled 2024-01-14 (×4): qty 1

## 2024-01-14 MED ORDER — METOPROLOL TARTRATE 50 MG PO TABS
50.0000 mg | ORAL_TABLET | Freq: Two times a day (BID) | ORAL | Status: DC
  Administered 2024-01-14 – 2024-01-17 (×7): 50 mg via ORAL
  Filled 2024-01-14 (×7): qty 1

## 2024-01-14 MED ORDER — BECLOMETHASONE DIPROP HFA 40 MCG/ACT IN AERB: 2.0000 | INHALATION_SPRAY | Freq: Two times a day (BID) | RESPIRATORY_TRACT | Status: DC | PRN

## 2024-01-14 MED ORDER — ALBUTEROL SULFATE (2.5 MG/3ML) 0.083% IN NEBU: 3.0000 mL | INHALATION_SOLUTION | Freq: Two times a day (BID) | RESPIRATORY_TRACT | Status: DC | PRN

## 2024-01-14 MED ORDER — ALPRAZOLAM 0.25 MG PO TABS: 0.2500 mg | ORAL_TABLET | Freq: Every evening | ORAL | Status: DC | PRN

## 2024-01-14 NOTE — Progress Notes (Signed)
 PROGRESS NOTE    Monica Williamson  FMW:[id number] DOB: [birth year] DOA: 01/13/2024 PCP: [name]., MD   Chief Complaint  Patient presents with   Fall    Brief Narrative:   Monica Williamson is a 83 y.o. female with medical history significant of atrial fibrillation, anxiety disorder, depression, essential hypertension, bronchiectasis, osteopenia, who sustained a fall through her attic and was brought to the ER with severe pain in her legs and her back.  She apparently was in the act over the garage when she missed a step and fell between the support beams of the roof on the ceiling.  She went through and landed on her husband's car in the garage.  Patient landed on her bilateral feet.  Workup in the ER showed bilateral calcaneus fractures.  Other imaging through the lumbar thoracic and cervical spine as well as bilateral hip showed no injury except left hip hematoma.  Patient was seen by orthopedic surgery.  Initial plan is for placement from the ER but due to severe pain which is uncontrolled, orthopedics recommended medical admission for pain management.  Physical therapy and Occupational Therapy evaluation and further disposition.    Assessment & Plan:   Principal Problem:   Fall Active Problems:   PAF (paroxysmal atrial fibrillation) (HCC)   Anxiety and depression  fall Bilateral calcaneus fractures Left hip hematoma Left parietal scalp wound -Patient presents due to fall from missing her step and falling from the attic . - She sustained bilateral calcaneus fracture, orthopedic input greatly appreciated, recommendation for nonoperative management and pain control.  . - Continue to monitor CBC closely in the setting of left hip hematoma she will she is on full anticoagulation . - She has been having significant pain upon my evaluation this morning, tearful, screaming in pain, I have informed her not to wait too long before asking for pain medicine, as last pain  medicine she had was 8 hours before current dose, I have asked her to anticipate pain and call for pain medicine early, will keep alternating between IV Dilaudid  and p.o. oxycodone  . - PT/OT consulted, likely will need SNF placement . - Continue with wound care including Betadine and dry dressing for left scalp wound  Transaminitis  - During routine labs this morning her LFTs were noted to be elevated, no right upper quadrant tenderness, no evidence of trauma on imaging on presentation . - Will check hepatitis panel . - For now DC Zetia  and statin   paroxysmal atrial fibrillation: - Hemoglobin stable, continue with Eliquis , monitor CBC closely    Anxiety with depression:  - Resume home regimen    DVT prophylaxis: Eliquis  Code Status: Full Family Communication: None at bedside Disposition:   Status is: Inpatient   Consultants:  orthopedic   Subjective:  Complaining of significant pain in bilateral heel areas  Objective: Vitals:   01/14/24 0000 01/14/24 0400 01/14/24 0802 01/14/24 1026  BP: (!) 126/58 121/60 (!) 126/52 (!) 119/57  Pulse: 80 75 76 88  Resp: 16 (!) 40 20   Temp: 97.9 F (36.6 C) 97.9 F (36.6 C) 98 F (36.7 C)   TempSrc:  Oral Oral   SpO2: 98% 97% 97%   Weight:      Height:       No intake or output data in the 24 hours ending 01/14/24 1325 Filed Weights   01/13/24 0823  Weight: 64.9 kg    Examination:  Awake Alert, Oriented X 3, very uncomfortable due  to pain (this has improved instantly after receiving IV Dilaudid ) Symmetrical Chest wall movement, Good air movement bilaterally, CTAB RRR,No Gallops,Rubs or new Murmurs, No Parasternal Heave +ve B.Sounds, Abd Soft, No tenderness, No rebound - guarding or rigidity. Bilateral lower extremity in the splints, she is having significant bruising in multiple areas, and left scalp wound, please see pictures below .           Data Reviewed: I have personally reviewed following labs and imaging  studies  CBC: Recent Labs  Lab 01/13/24 0943 01/13/24 1830 01/14/24 1028  WBC 14.7* 10.6* 8.9  NEUTROABS 12.0*  --   --   HGB 13.3 11.6* 10.9*  HCT 41.4 35.9* 34.2*  MCV 101.7* 100.8* 102.7*  PLT 226 217 217    Basic Metabolic Panel: Recent Labs  Lab 01/13/24 0943 01/14/24 1028  NA 141 139  K 4.5 4.6  CL 107 105  CO2 27 26  GLUCOSE 117* 113*  BUN 15 14  CREATININE 1.00 1.02*  CALCIUM  9.3 8.1*    GFR: Estimated Creatinine Clearance: 37.7 mL/min (A) (by C-G formula based on SCr of 1.02 mg/dL (H)).  Liver Function Tests: Recent Labs  Lab 01/14/24 1028  AST 370*  ALT 301*  ALKPHOS 158*  BILITOT 1.1  PROT 5.4*  ALBUMIN 3.1*    CBG: No results for input(s): GLUCAP in the last 168 hours.   No results found for this or any previous visit (from the past 240 hours).       Radiology Studies: CT Foot Left Wo Contrast Result Date: 01/13/2024 CLINICAL DATA:  Blunt trauma. Patient fell through attic floor into the garage. EXAM: CT OF THE LEFT FOOT WITHOUT CONTRAST TECHNIQUE: Multidetector CT imaging of the left foot was performed according to the standard protocol. Multiplanar CT image reconstructions were also generated. RADIATION DOSE REDUCTION: This exam was performed according to the departmental dose-optimization program which includes automated exposure control, adjustment of the mA and/or kV according to patient size and/or use of iterative reconstruction technique. COMPARISON:  Same day radiographs of the left ankle dated 01/13/2024. FINDINGS: Bones/Joint/Cartilage Extensively comminuted fracture of the calcaneus. There is approximately 5 mm of lateral displacement posterolaterally (series 1, image 145). Fracture margins extend through the posterior and plantar aspects of the calcaneal tuberosity, the posterior subtalar joint, the sustentaculum tali, and the anterior calcaneal process. Anterior calcaneal process fracture fragment extends into the region of the  sinus tarsi. Minimally distracted fracture of the superior margin of the posterior talar process (series 6, image 29). Nondisplaced fracture of the medial malleolus (series 1, image 146). No significant clear space widening of the ankle mortise. Ligaments Ligaments are suboptimally evaluated by CT. Muscles and Tendons No intramuscular fluid collection or hematoma. Soft tissue Soft tissue swelling of the ankle and hindfoot, most pronounced posteriorly. No loculated fluid collection. No radiopaque foreign body IMPRESSION: 1. Extensively comminuted, intra-articular and displaced fracture of the left calcaneus, as above. 2. Nondisplaced fracture of the medial malleolus. 3. Minimally distracted fracture of the posterior talar process. Electronically Signed   By: Harrietta Sherry M.D.   On: 01/13/2024 13:12   CT Foot Right Wo Contrast Result Date: 01/13/2024 CLINICAL DATA:  Blunt trauma. Patient fell throughout attic floor into garage. EXAM: CT OF THE RIGHT FOOT WITHOUT CONTRAST TECHNIQUE: Multidetector CT imaging of the right foot was performed according to the standard protocol. Multiplanar CT image reconstructions were also generated. RADIATION DOSE REDUCTION: This exam was performed according to the departmental dose-optimization program which  includes automated exposure control, adjustment of the mA and/or kV according to patient size and/or use of iterative reconstruction technique. COMPARISON:  Same day radiographs of the right ankle dated 01/13/2024. FINDINGS: Bones/Joint/Cartilage Extensively comminuted fracture of the calcaneus. There is approximately 8 mm of lateral displacement posterolaterally (series 3, image 129). Fracture margins extend through the posterior and plantar aspects of the calcaneal tuberosity, the posterior subtalar joint, the sustentaculum tali, the anterior calcaneal process, as well as the articular surface at the level of the calcaneocuboid joint. Anterior calcaneal process fracture  fragments extend into the level of the sinus tarsi. Tiny linear osseous fragment adjacent to the medial plantar base of the cuboid is compatible with a minimally distracted avulsion fracture (series 7, image 56). Nondisplaced fracture at the proximal dorsal margin of the medial cuneiform (series 8, image 36 and series 3, image 87). No evidence of diastasis or malalignment at the level of the Lisfranc interval. Tibiotalar joint is anatomically aligned. Ankle mortise appears congruent. Ligaments Ligaments are suboptimally evaluated by CT. Muscles and Tendons No intramuscular fluid collection or hematoma. Soft tissue Soft tissue swelling of the ankle and hindfoot, most pronounced posterolaterally. No loculated fluid collection. No radiopaque foreign body. IMPRESSION: 1. Extensively comminuted, intra-articular and displaced fracture of the right calcaneus, as above. 2. Nondisplaced fracture at the proximal dorsal margin of the medial cuneiform. 3. Tiny linear osseous fragment adjacent to the medial plantar base of the cuboid is compatible with a minimally distracted avulsion fracture. Electronically Signed   By: Harrietta Sherry M.D.   On: 01/13/2024 13:00   CT CHEST ABDOMEN PELVIS W CONTRAST Result Date: 01/13/2024 CLINICAL DATA:  Poly trauma, blunt. EXAM: CT CHEST, ABDOMEN, AND PELVIS WITH CONTRAST TECHNIQUE: Multidetector CT imaging of the chest, abdomen and pelvis was performed following the standard protocol during bolus administration of intravenous contrast. RADIATION DOSE REDUCTION: This exam was performed according to the departmental dose-optimization program which includes automated exposure control, adjustment of the mA and/or kV according to patient size and/or use of iterative reconstruction technique. CONTRAST:  75mL OMNIPAQUE IOHEXOL 350 MG/ML SOLN COMPARISON:  CT chest 03/08/2013 FINDINGS: CT CHEST FINDINGS Cardiovascular: Atherosclerotic calcifications in thoracic aorta without aneurysm. No evidence  for an aortic dissection. Heart size is normal. No significant pericardial effusion. Mediastinum/Nodes: Negative for a mediastinal hematoma. No lymph node enlargement in the mediastinum or hila. No axillary lymph node enlargement. Negative for pneumomediastinum. Esophagus is unremarkable. Lungs/Pleura: Negative for pneumothorax. Mild scarring at the lung apices. Mild bronchiectasis in the medial right upper lobe on image 77/4 and this is chronic. Small nodular densities near this bronchiectasis appears new and largest nodule measures 5 mm on image 77/4. Additional bronchiectasis and small nodular densities in the right middle lobe. These nodular densities are suggestive for post inflammatory changes based on the morphology. No pleural effusions. Peripheral nodular and tubular densities in the posterior left lower lobe, best seen on image 89/4. Elongated tubular nodular area on image 89/4 measures up to 9 mm. Multiple small nodular densities scattered throughout the left lung. Musculoskeletal: No acute bone abnormality. CT ABDOMEN PELVIS FINDINGS Hepatobiliary: Cholecystectomy. Mild intrahepatic biliary dilatation. Main portal venous system is patent. No evidence for acute injury or laceration. Pancreas: 3 mm hypodensity near the pancreatic tail appears chronic and likely an incidental finding. No evidence for pancreatic duct dilatation or inflammation. Spleen: Normal in size without focal abnormality. Adrenals/Urinary Tract: Mild fullness of the adrenal glands appear chronic. There is probably a small right adrenal adenoma based on  the prior chest CT. 3 mm calcification in left kidney lower pole. Negative for hydronephrosis. 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 No suspicious renal lesion. Evidence for renal cysts that do not require dedicated follow-up. No hydronephrosis. Normal urinary bladder. Stomach/Bowel: Moderate distention of the rectum with stool. No acute bowel inflammation. No evidence for  bowel dilatation or obstruction. Vascular/Lymphatic: Aortic atherosclerosis. No enlarged abdominal or pelvic lymph nodes. Reproductive: Uterus is absent. Low-density material or fluid in the region of the right adnexa on image 100/30 is nonspecific but likely within normal limits. No suspicious adnexal mass. Other: No evidence for free fluid.  Negative for free air. Musculoskeletal: Soft tissue edema and probable hematoma along the lateral left hip at the level of the left greater trochanter. This soft tissue abnormalities are involving the subcutaneous tissues and the lateral aspect of the gluteal musculature. Small hypervascular areas in this area and cannot exclude small areas of bleeding. Both hips are located. Multilevel degenerative disc space narrowing and endplate disease in lumbar spine. No acute bone abnormality. IMPRESSION: 1. Soft tissue edema and hematoma along the lateral left hip at the level of the left greater trochanter. This hematoma is involving the subcutaneous tissue and lateral gluteal musculature. Concern for small foci of contrast extravasation and bleeding in this area. 2. No other acute abnormality in the chest, abdomen or pelvis. 3. Chronic lung changes with multiple small nodular densities scattered throughout the lungs. Morphology is suggestive for post infectious or inflammatory changes. Largest nodular area measures up to 9 mm. Recommend follow-up chest CT in 3 months to ensure resolution. 4. Nonobstructing left renal calculus. 5. Aortic Atherosclerosis (ICD10-I70.0). These results were called by telephone at the time of interpretation on 01/13/2024 at 12:53 pm to provider JAYSON PEREYRA , who verbally acknowledged these results. Electronically Signed   By: Juliene Balder M.D.   On: 01/13/2024 12:57   CT Thoracic Spine Wo Contrast Result Date: 01/13/2024 CLINICAL DATA:  Back pain.  Trauma.  Status post fall. EXAM: CT THORACIC SPINE WITHOUT CONTRAST TECHNIQUE: Multidetector CT images of the  thoracic were obtained using the standard protocol without intravenous contrast. RADIATION DOSE REDUCTION: This exam was performed according to the departmental dose-optimization program which includes automated exposure control, adjustment of the mA and/or kV according to patient size and/or use of iterative reconstruction technique. COMPARISON:  CT chest from 03/08/2013 FINDINGS: Alignment: There is upper thoracic kyphosis deformity. Vertebrae: No signs acute fracture or dislocation. Mild anterior wedging the T8 vertebra identified which appears unchanged from 03/08/2013. Paraspinal and other soft tissues: No signs hematoma within the thoracic canal. No paraspinal fluid collections identified. Trace bilateral pleural effusions. Diffuse bronchial wall thickening identified. Interlobular septal thickening identified compatible with mild edema. Aortic atherosclerosis. Nodular thickening of bilateral adrenal glands likely reflect underlying adenomas. Disc levels: Multilevel disc space narrowing with endplate spurring. Calcified posterior disc bulge/herniation noted at T8-9 without signs of spinal stenosis. IMPRESSION: 1. No signs of acute fracture or dislocation. 2. Upper thoracic kyphosis deformity. 3. Multilevel degenerative disc disease. 4. Trace bilateral pleural effusions. 5. Diffuse bronchial wall thickening and interlobular septal thickening compatible with mild edema. 6.  Aortic Atherosclerosis (ICD10-I70.0). Electronically Signed   By: Waddell Calk M.D.   On: 01/13/2024 10:17   CT Lumbar Spine Wo Contrast Result Date: 01/13/2024 CLINICAL DATA:  Low back pain after fall.  Trauma. EXAM: CT LUMBAR SPINE WITHOUT CONTRAST TECHNIQUE: Multidetector CT imaging of the lumbar spine was performed without intravenous contrast administration. Multiplanar CT  image reconstructions were also generated. RADIATION DOSE REDUCTION: This exam was performed according to the departmental dose-optimization program which  includes automated exposure control, adjustment of the mA and/or kV according to patient size and/or use of iterative reconstruction technique. COMPARISON:  Lumbar spine MRI from 11/25/21. FINDINGS: Segmentation: 5 non rib-bearing lumbar vertebra identified. Alignment: Similar grade 1 retrolisthesis of L5 on S1. Vertebrae: The lumbar vertebral body heights are well preserved. No signs of acute fracture. The facet joints are aligned. No signs of subluxation. Paraspinal and other soft tissues: No paraspinal fluid collections. No signs of intra canal hematoma. Stone is noted within the lower pole of left kidney measuring 4 mm. Multiple left kidney cysts are identified. The largest is off the upper pole measuring 2.8 cm. Aortic atherosclerosis. Cholecystectomy. Sigmoid diverticulosis without signs of acute diverticulitis. Disc levels: Multilevel disc space narrowing and endplate spurring is noted at L2-3, L3-4, L4-5 and L5-S1. IMPRESSION: 1. No evidence for acute fracture or subluxation of the lumbar spine. 2. Multilevel degenerative disc disease. 3. Left nephrolithiasis. 4. Sigmoid diverticulosis without signs of acute diverticulitis. 5.  Aortic Atherosclerosis (ICD10-I70.0). Electronically Signed   By: Waddell Calk M.D.   On: 01/13/2024 10:09   CT HEAD WO CONTRAST Result Date: 01/13/2024 CLINICAL DATA:  Provided history: Head trauma, moderate/severe. Poly trauma, blunt. Fall. EXAM: CT HEAD WITHOUT CONTRAST CT CERVICAL SPINE WITHOUT CONTRAST TECHNIQUE: Multidetector CT imaging of the head and cervical spine was performed following the standard protocol without intravenous contrast. Multiplanar CT image reconstructions of the cervical spine were also generated. RADIATION DOSE REDUCTION: This exam was performed according to the departmental dose-optimization program which includes automated exposure control, adjustment of the mA and/or kV according to patient size and/or use of iterative reconstruction technique.  COMPARISON:  CT angiogram neck 03/03/2017. FINDINGS: CT HEAD FINDINGS Brain: Mild generalized cerebral atrophy. Patchy and ill-defined hypoattenuation within the cerebral white matter, nonspecific but compatible with mild-to-moderate chronic small vessel ischemic disease. Chronic right thalamic lacunar infarct. There is no acute intracranial hemorrhage. No demarcated cortical infarct. No extra-axial fluid collection. No evidence of an intracranial mass. No midline shift. Vascular: No hyperdense vessel.  Atherosclerotic calcifications. Skull: No calvarial fracture or aggressive osseous lesion. Sinuses/Orbits: No mass or acute finding within the imaged orbits. Moderate mucosal thickening, and small fluid level, within the right maxillary sinus. Other: Left parietal scalp hematoma and laceration. CT CERVICAL SPINE FINDINGS Alignment: 2 mm grade 1 anterolisthesis at C4-C5, C5-C6, C6-C7, C7-T1, T1-T2 and T2-T3. Skull base and vertebrae: The basion-dental and atlanto-dental intervals are maintained.No evidence of acute fracture to the cervical spine. Redemonstrated chronic fracture deformity through the anterior superior corner of the C5 vertebral body (with small fracture fragment). This was present on the prior CTA neck of 03/03/2017. Soft tissues and spinal canal: No prevertebral fluid or swelling. No visible canal hematoma. Disc levels: Cervical spondylosis with multilevel disc space narrowing, disc bulges/central disc protrusions, uncovertebral hypertrophy and facet arthropathy. Disc space narrowing is greatest at C5-C6 (advanced at this level) and there is a posterior disc osteophyte complex at this level. No appreciable high-grade spinal canal stenosis. Multilevel bony neural foraminal narrowing. Degenerative changes also present at the C1-C2 articulation. Upper chest: No consolidation within the imaged lung apices. No visible pneumothorax. IMPRESSION: CT head: 1.  No evidence of an acute intracranial abnormality.  2. Left parietal scalp hematoma and laceration. 3. 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 4. Right maxillary sinus disease as described  CT cervical spine: 1. No evidence of an acute cervical spine fracture. 2. Redemonstrated chronic fracture through anterosuperior corner of the C5 vertebral body (with small fracture fragment). 3. Grade 1 anterolisthesis at C4-C5, C5-C6, C6-C7, C7-T1, T1-T2 and T2-T3. 4. Cervical spondylosis as described. Electronically Signed   By: Rockey Childs D.O.   On: 01/13/2024 10:08   CT CERVICAL SPINE WO CONTRAST Result Date: 01/13/2024 CLINICAL DATA:  Provided history: Head trauma, moderate/severe. Poly trauma, blunt. Fall. EXAM: CT HEAD WITHOUT CONTRAST CT CERVICAL SPINE WITHOUT CONTRAST TECHNIQUE: Multidetector CT imaging of the head and cervical spine was performed following the standard protocol without intravenous contrast. Multiplanar CT image reconstructions of the cervical spine were also generated. RADIATION DOSE REDUCTION: This exam was performed according to the departmental dose-optimization program which includes automated exposure control, adjustment of the mA and/or kV according to patient size and/or use of iterative reconstruction technique. COMPARISON:  CT angiogram neck 03/03/2017. FINDINGS: CT HEAD FINDINGS Brain: Mild generalized cerebral atrophy. Patchy and ill-defined hypoattenuation within the cerebral white matter, nonspecific but compatible with mild-to-moderate chronic small vessel ischemic disease. Chronic right thalamic lacunar infarct. There is no acute intracranial hemorrhage. No demarcated cortical infarct. No extra-axial fluid collection. No evidence of an intracranial mass. No midline shift. Vascular: No hyperdense vessel.  Atherosclerotic calcifications. Skull: No calvarial fracture or aggressive osseous lesion. Sinuses/Orbits: No mass or acute finding within the imaged orbits. Moderate  mucosal thickening, and small fluid level, within the right maxillary sinus. Other: Left parietal scalp hematoma and laceration. CT CERVICAL SPINE FINDINGS Alignment: 2 mm grade 1 anterolisthesis at C4-C5, C5-C6, C6-C7, C7-T1, T1-T2 and T2-T3. Skull base and vertebrae: The basion-dental and atlanto-dental intervals are maintained.No evidence of acute fracture to the cervical spine. Redemonstrated chronic fracture deformity through the anterior superior corner of the C5 vertebral body (with small fracture fragment). This was present on the prior CTA neck of 03/03/2017. Soft tissues and spinal canal: No prevertebral fluid or swelling. No visible canal hematoma. Disc levels: Cervical spondylosis with multilevel disc space narrowing, disc bulges/central disc protrusions, uncovertebral hypertrophy and facet arthropathy. Disc space narrowing is greatest at C5-C6 (advanced at this level) and there is a posterior disc osteophyte complex at this level. No appreciable high-grade spinal canal stenosis. Multilevel bony neural foraminal narrowing. Degenerative changes also present at the C1-C2 articulation. Upper chest: No consolidation within the imaged lung apices. No visible pneumothorax. IMPRESSION: CT head: 1.  No evidence of an acute intracranial abnormality. 2. Left parietal scalp hematoma and laceration. 3. 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 4. Right maxillary sinus disease as described CT cervical spine: 1. No evidence of an acute cervical spine fracture. 2. Redemonstrated chronic fracture through anterosuperior corner of the C5 vertebral body (with small fracture fragment). 3. Grade 1 anterolisthesis at C4-C5, C5-C6, C6-C7, C7-T1, T1-T2 and T2-T3. 4. Cervical spondylosis as described. Electronically Signed   By: Rockey Childs D.O.   On: 01/13/2024 10:08   DG Ankle Left Port Result Date: 01/13/2024 CLINICAL DATA:  Blunt trauma.  Fell through [employer]  floor EXAM: PORTABLE LEFT ANKLE - 2 VIEW COMPARISON:  None Available. FINDINGS: There is a nondisplaced fracture through the medial malleolus. There is also comminuted fracture deformity involving the calcaneus with multiple fracture lines identified posteriorly as well as within the anterior calcaneus. Fracture fragments appear to be in near anatomic alignment. IMPRESSION: 1. Nondisplaced fracture through the medial malleolus. 2. Comminuted fracture deformity of the calcaneus. Electronically  Signed   By: Waddell Calk M.D.   On: 01/13/2024 09:22   DG Ankle Right Port Result Date: 01/13/2024 .  CLINICAL DATA: Blunt trauma.  Patient fell through attic floor into garage. EXAM: PORTABLE RIGHT ANKLE - 2 VIEW COMPARISON:  None Available. FINDINGS: There is a lucency through the posterior calcaneus concerning for nondisplaced fracture. Extent of fracture is suboptimally evaluated on this radiograph. There may also be fractures involving the anterior calcaneus though the fracture lines appear indistinct IMPRESSION: 1. Nondisplaced calcaneal fracture is identified. Consider further evaluation with CT of the ankle. Electronically Signed   By: Waddell Calk M.D.   On: 01/13/2024 09:20   DG Pelvis Portable Result Date: 01/13/2024 CLINICAL DATA:  Clemens through attic into garage. EXAM: PORTABLE PELVIS 1-2 VIEWS COMPARISON:  None Available. FINDINGS: There are no signs of acute fracture or dislocation. No evidence for pelvic diastasis. Degenerative disc disease noted in the lumbar spine. IMPRESSION: 1. No acute findings. 2. Lumbar degenerative disc disease. Electronically Signed   By: Waddell Calk M.D.   On: 01/13/2024 09:16   DG Elbow 2 Views Right Result Date: 01/13/2024 CLINICAL DATA:  Trauma.  Fell through [employer]. EXAM: RIGHT ELBOW - 2 VIEW COMPARISON:  None Available. FINDINGS: There is no joint effusion. No signs of acute fracture or subluxation. No significant arthropathy. IMPRESSION: Negative.  Electronically Signed   By: Waddell Calk M.D.   On: 01/13/2024 09:15   DG Elbow 2 Views Left Result Date: 01/13/2024 CLINICAL DATA:  Clemens through attic into arise.  Elbow pain. EXAM: LEFT ELBOW - 2 VIEW COMPARISON:  None Available. FINDINGS: No signs of joint effusion. No acute fracture or dislocation. No significant arthropathy. IMPRESSION: Negative. Electronically Signed   By: Waddell Calk M.D.   On: 01/13/2024 09:14   DG Chest Port 1 View Result Date: 01/13/2024 CLINICAL DATA:  Trauma.  Fell through floor of attic into arrive. EXAM: PORTABLE CHEST 1 VIEW COMPARISON:  10/11/2023 FINDINGS: Heart size and mediastinal contours are normal. Aortic atherosclerotic calcification. Pulmonary vascular congestion without frank edema. No pleural effusion or pneumothorax identified. No focal airspace consolidation. No fracture identified withinvisualized osseous structures. IMPRESSION: Pulmonary vascular congestion without frank edema. Electronically Signed   By: Waddell Calk M.D.   On: 01/13/2024 09:13        Scheduled Meds:  apixaban   5 mg Oral BID   atorvastatin   40 mg Oral q1800   buPROPion   150 mg Oral q morning   cholecalciferol  1,000 Units Oral Daily   diltiazem   180 mg Oral Daily   ezetimibe   10 mg Oral Daily   loratadine   10 mg Oral Daily   metoprolol  tartrate  50 mg Oral BID   multivitamin with minerals  1 tablet Oral Daily   psyllium  1 packet Oral Daily   Continuous Infusions:  lactated ringers  100 mL/hr at 01/14/24 1043     LOS: 1 day       Brayton Lye, MD [facility]   To contact the attending provider between 7A-7P or the covering provider during after hours 7P-7A, please log into the web site [email or web address] and access using universal [place] password for that web site. If you do not have the password, please call the hospital operator.  01/14/2024, 1:25 PM

## 2024-01-14 NOTE — TOC Initial Note (Signed)
 Transition of Care [facility]) - Initial/Assessment Note    Patient Details  Name: Monica Williamson MRN: [id number] Date of Birth: 1940-09-14  Transition of Care [facility]) CM/SW Contact:    Cena Ligas, LCSW Phone Number: 01/14/2024, 10:46 AM  Clinical Narrative:                  CSW received SNF consult. CSW spoke to members daughter Monica Williamson [phone]) on the phone. CSW introduced self and explained role at the hospital. Monica reports that PTA she lived at home with her spouse and was independent with ADL's.  FL2 has already been completed. Patient has already be faxed out for bed offers. LCM explained that more bed offers should be available tomorrow. Monica asked for all communication goes through her because she is the POA and states she has paperwork.   CSW will continue to follow.    Expected Discharge Plan: Skilled Nursing Facility Barriers to Discharge: Continued Medical Work up   Patient Goals and CMS Choice Patient states their goals for this hospitalization and ongoing recovery are:: TO return home CMS Medicare.gov Compare Post Acute Care list provided to:: Patient Choice offered to / list presented to : Adult Children      Expected Discharge Plan and Services       Living arrangements for the past 2 months: Single Family Home                                      Prior Living Arrangements/Services Living arrangements for the past 2 months: Single Family Home Lives with:: Spouse Patient language and need for interpreter reviewed:: Yes Do you feel safe going back to the place where you live?: Yes      Need for Family Participation in Patient Care: Yes (Comment) Care giver support system in place?: Yes (comment)   Criminal Activity/Legal Involvement Pertinent to Current Situation/Hospitalization: No - Comment as needed  Activities of Daily Living   ADL Screening (condition at time of admission) Independently performs ADLs?: Yes (appropriate  for developmental age) Is the patient deaf or have difficulty hearing?: Yes Does the patient have difficulty seeing, even when wearing glasses/contacts?: Yes Does the patient have difficulty concentrating, remembering, or making decisions?: Yes  Permission Sought/Granted Permission sought to share information with : Facility Industrial/product designer granted to share information with : Yes, Verbal Permission Granted     Permission granted to share info w AGENCY: SNF        Emotional Assessment Appearance:: Appears stated age Attitude/Demeanor/Rapport: Engaged Affect (typically observed): Stable Orientation: : Oriented to Self, Oriented to Place, Oriented to  Time, Oriented to Situation Alcohol  / Substance Use: Not Applicable Psych Involvement: No (comment)  Admission diagnosis:  Fall [W19.XXXA] Fall in home, initial encounter [W19.XXXA, Y92.009] Closed fracture of both calcanei, initial encounter [S92.001A, S92.002A] Nondisplaced fracture of medial malleolus of left tibia, initial encounter for closed fracture [S82.55XA] Patient Active Problem List   Diagnosis Date Noted   Fall 01/13/2024   Acute bronchitis due to human metapneumovirus 10/11/2023   ARF (acute renal failure) (HCC) 10/10/2023   Anxiety and depression 10/10/2023   OP (osteoporosis) 11/15/2021   Persistent atrial fibrillation (HCC)    Carotid artery disease (HCC) 08/15/2016   Post-nasal drip 03/30/2016   History of acute epiglottitis 03/30/2016   Stridor 02/28/2016   Sepsis (HCC) 02/28/2016   Epiglottitis 02/28/2016   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   Long term (current) use of anticoagulants 10/01/2012   PAF (paroxysmal atrial fibrillation) (HCC) 09/25/2012   Chest pain 10/26/2010   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   BRONCHIECTASIS 08/13/2007   Allergic rhinitis 05/12/2007   Extrinsic asthma 05/12/2007   ABSCESS, BREAST 05/12/2007   Chronic cough 05/12/2007   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 [place] [address] Phone: [phone] Fax: [phone]  [facility]) - [place], [place] - [address] [facility] [address] [place] [address] [place] [address] Phone: [phone] Fax: [phone]  [facility] - [place], [place] - [address] [place] [address] Phone: [phone] Fax: [phone]  [id number] - [place], [place] - [address] AT [facility] OF [facility] & [address] [place] [address] Phone: [phone] Fax: [phone]  [facility] [address] [place] [address] Phone: [phone] Fax: [phone]     Social Drivers of Health (SDOH) Social History: SDOH Screenings   Food Insecurity: No Food Insecurity (01/14/2024)  Housing: Unknown (01/14/2024)  Transportation Needs: No Transportation Needs (01/14/2024)  Utilities: Not At Risk (01/14/2024)  Social Connections: Socially Integrated (01/14/2024)  Tobacco Use: Medium Risk (01/13/2024)   SDOH Interventions:     Readmission Risk Interventions    10/11/2023    4:03 PM  Readmission Risk Prevention Plan  Post Dischage Appt Complete  Medication Screening Complete  Transportation Screening Complete

## 2024-01-14 NOTE — Plan of Care (Signed)
  Problem: Education: Goal: Knowledge of General Education information will improve Description: Including pain rating scale, medication(s)/side effects and non-pharmacologic comfort measures Outcome: Progressing   Problem: Health Behavior/Discharge Planning: Goal: Ability to manage health-related needs will improve Outcome: Progressing   Problem: Clinical Measurements: Goal: Ability to maintain clinical measurements within normal limits will improve Outcome: Progressing Goal: Will remain free from infection Outcome: Progressing Goal: Diagnostic test results will improve Outcome: Progressing Goal: Respiratory complications will improve Outcome: Progressing Goal: Cardiovascular complication will be avoided Outcome: Progressing   Problem: Activity: Goal: Risk for activity intolerance will decrease Outcome: Progressing   Problem: Nutrition: Goal: Adequate nutrition will be maintained Outcome: Progressing   Problem: Coping: Goal: Level of anxiety will decrease Outcome: Progressing   Problem: Elimination: Goal: Will not experience complications related to bowel motility Outcome: Progressing Goal: Will not experience complications related to urinary retention Outcome: Progressing   Problem: Pain Managment: Goal: General experience of comfort will improve and/or be controlled Outcome: Progressing   Problem: Safety: Goal: Ability to remain free from injury will improve Outcome: Progressing   Problem: Skin Integrity: Goal: Risk for impaired skin integrity will decrease Outcome: Progressing  0500 Pt daughter called unit for an update, pt resting well pain controlled to bilat ankle fractures, pt pain controlled with Dilaudid  1 mg IVP,

## 2024-01-14 NOTE — Plan of Care (Signed)
 Patient with HOB elevated. Scheduled medications given to patient.  See MAR for pain medication given to patient. Left side of scalp with old drainage noted on pillow case; scalp gently cleansed - wound care done per MD orders.  Consult placed for Wound care.  Call light within reach. Patient with resp even and unlabored. Continuous monitoring in progress.   Ace wrap and dressing to bilaterally lower extremities intact.  Provided several in-depth explanations of POC with patient and family members  Spouse remains at bedside and is attentive to patient. Questions answered as necessary. Problem: Education: Goal: Knowledge of General Education information will improve Description: Including pain rating scale, medication(s)/side effects and non-pharmacologic comfort measures Outcome: Progressing   Problem: Health Behavior/Discharge Planning: Goal: Ability to manage health-related needs will improve Outcome: Progressing   Problem: Clinical Measurements: Goal: Ability to maintain clinical measurements within normal limits will improve Outcome: Progressing Goal: Will remain free from infection Outcome: Progressing Goal: Diagnostic test results will improve Outcome: Progressing Goal: Respiratory complications will improve Outcome: Progressing Goal: Cardiovascular complication will be avoided Outcome: Progressing   Problem: Activity: Goal: Risk for activity intolerance will decrease Outcome: Progressing   Problem: Nutrition: Goal: Adequate nutrition will be maintained Outcome: Progressing   Problem: Coping: Goal: Level of anxiety will decrease Outcome: Progressing   Problem: Elimination: Goal: Will not experience complications related to bowel motility Outcome: Progressing Goal: Will not experience complications related to urinary retention Outcome: Progressing   Problem: Pain Managment: Goal: General experience of comfort will improve and/or be controlled Outcome: Progressing    Problem: Safety: Goal: Ability to remain free from injury will improve Outcome: Progressing   Problem: Skin Integrity: Goal: Risk for impaired skin integrity will decrease Outcome: Progressing

## 2024-01-15 DIAGNOSIS — S92001A Unspecified fracture of right calcaneus, initial encounter for closed fracture: Secondary | ICD-10-CM | POA: Diagnosis not present

## 2024-01-15 DIAGNOSIS — W19XXXS Unspecified fall, sequela: Secondary | ICD-10-CM | POA: Diagnosis not present

## 2024-01-15 DIAGNOSIS — S92002A Unspecified fracture of left calcaneus, initial encounter for closed fracture: Secondary | ICD-10-CM | POA: Diagnosis not present

## 2024-01-15 LAB — CBC
HCT: 31.1 % — ABNORMAL LOW (ref 36.0–46.0)
Hemoglobin: 9.9 g/dL — ABNORMAL LOW (ref 12.0–15.0)
MCH: 33.1 pg (ref 26.0–34.0)
MCHC: 31.8 g/dL (ref 30.0–36.0)
MCV: 104 fL — ABNORMAL HIGH (ref 80.0–100.0)
Platelets: 172 10*3/uL (ref 150–400)
RBC: 2.99 MIL/uL — ABNORMAL LOW (ref 3.87–5.11)
RDW: 15.6 % — ABNORMAL HIGH (ref 11.5–15.5)
WBC: 8.6 10*3/uL (ref 4.0–10.5)
nRBC: 0 % (ref 0.0–0.2)

## 2024-01-15 LAB — COMPREHENSIVE METABOLIC PANEL WITH GFR
ALT: 265 U/L — ABNORMAL HIGH (ref 0–44)
AST: 239 U/L — ABNORMAL HIGH (ref 15–41)
Albumin: 2.8 g/dL — ABNORMAL LOW (ref 3.5–5.0)
Alkaline Phosphatase: 160 U/L — ABNORMAL HIGH (ref 38–126)
Anion gap: 9 (ref 5–15)
BUN: 15 mg/dL (ref 8–23)
CO2: 24 mmol/L (ref 22–32)
Calcium: 8.1 mg/dL — ABNORMAL LOW (ref 8.9–10.3)
Chloride: 102 mmol/L (ref 98–111)
Creatinine, Ser: 0.86 mg/dL (ref 0.44–1.00)
GFR, Estimated: >60 mL/min (ref >60)
Glucose, Bld: 101 mg/dL — ABNORMAL HIGH (ref 70–99)
Potassium: 4.6 mmol/L (ref 3.5–5.1)
Sodium: 135 mmol/L (ref 135–145)
Total Bilirubin: 1.1 mg/dL (ref 0.0–1.2)
Total Protein: 5.2 g/dL — ABNORMAL LOW (ref 6.5–8.1)

## 2024-01-15 LAB — HEPATITIS PANEL, ACUTE
HCV Ab: NONREACTIVE
Hep A IgM: NONREACTIVE
Hep B C IgM: NONREACTIVE
Hepatitis B Surface Ag: NONREACTIVE

## 2024-01-15 MED ORDER — HYDROMORPHONE HCL 1 MG/ML IJ SOLN
0.2500 mg | INTRAMUSCULAR | Status: DC | PRN
  Administered 2024-01-16: 0.5 mg via INTRAVENOUS
  Filled 2024-01-15: qty 0.5

## 2024-01-15 MED ORDER — SENNA 8.6 MG PO TABS
2.0000 | ORAL_TABLET | Freq: Every day | ORAL | Status: DC
  Administered 2024-01-15 – 2024-01-17 (×3): 17.2 mg via ORAL
  Filled 2024-01-15 (×3): qty 2

## 2024-01-15 MED ORDER — HYDROMORPHONE HCL 1 MG/ML IJ SOLN
1.0000 mg | INTRAMUSCULAR | Status: DC | PRN
  Filled 2024-01-15: qty 1

## 2024-01-15 MED ORDER — POLYETHYLENE GLYCOL 3350 17 G PO PACK
17.0000 g | PACK | Freq: Two times a day (BID) | ORAL | Status: DC | PRN
  Administered 2024-01-15: 17 g via ORAL
  Filled 2024-01-15: qty 1

## 2024-01-15 MED ORDER — HYDROMORPHONE HCL 1 MG/ML IJ SOLN
0.5000 mg | INTRAMUSCULAR | Status: DC | PRN
  Administered 2024-01-15: 0.5 mg via INTRAVENOUS
  Filled 2024-01-15: qty 0.5

## 2024-01-15 MED ORDER — OXYCODONE HCL 5 MG PO TABS
5.0000 mg | ORAL_TABLET | ORAL | Status: DC | PRN
  Administered 2024-01-15 – 2024-01-17 (×12): 5 mg via ORAL
  Filled 2024-01-15 (×12): qty 1

## 2024-01-15 MED ORDER — ENSURE PLUS HIGH PROTEIN PO LIQD: 237.0000 mL | Freq: Two times a day (BID) | ORAL | Status: DC

## 2024-01-15 MED ORDER — ENSURE PLUS HIGH PROTEIN PO LIQD
237.0000 mL | Freq: Three times a day (TID) | ORAL | Status: DC
  Administered 2024-01-15 – 2024-01-16 (×4): 237 mL via ORAL

## 2024-01-15 NOTE — Consult Note (Signed)
 WOC Nurse Consult Note: Reason for Consult: scalp laceration Fall at home  Wound type: trauma Pressure Injury POA: NA Measurement: see image; nursing flowsheets Wound azi:rozjw Drainage (amount, consistency, odor) see nursing flow sheets Periwound: intact  Dressing procedure/placement/frequency:   Please apply bacitracin ointment to left parietal scalp wound, then cover with Xeroform and apply dry gauze.    Ok to use hat from home to hold in place or mesh underwear   Re consult if needed, will not follow at this time. Thanks  Billiejo Sorto [employer], RN,CWOCN, CNS, CWON-AP [phone])

## 2024-01-15 NOTE — Progress Notes (Signed)
 Physical Therapy Treatment Patient Details Name: Monica Williamson MRN: [id number] DOB: 01-Aug-1940 Today's Date: 01/15/2024   History of Present Illness Pt is a 83 y.o. F presenting to [facility] following a fall from her attic. IMG shows bilateral ankle fx. PMH is significant for anxiety, A-fib, bronchiectasis, cholelithiasis, diverticulsosi, HTN, and OP.    PT Comments  Pt tolerated treatment well today. Co-treat with OT. Pt was able to transfer into chair today via AP transfer with +2 Mod A. Pt and family given extensive education on DC planning, DME needs, and seated therex. No change in DC/DME recs at this time. PT will continue to follow.     If plan is discharge home, recommend the following: A lot of help with walking and/or transfers;A lot of help with bathing/dressing/bathroom;Assistance with cooking/housework;Assist for transportation;Help with stairs or ramp for entrance   Can travel by private vehicle     No  Equipment Recommendations  Hospital bed;Hoyer lift;Other (comment);BSC/3in1 ([place])    Recommendations for Other Services       Precautions / Restrictions Precautions Precautions: Fall Recall of Precautions/Restrictions: Intact Splint/Cast: bilateral LE Splint/Cast - Date Prophylactic Dressing Applied (if applicable): 01/13/24 Restrictions Weight Bearing Restrictions Per Provider Order: Yes RLE Weight Bearing Per Provider Order: Non weight bearing LLE Weight Bearing Per Provider Order: Non weight bearing     Mobility  Bed Mobility Overal bed mobility: Needs Assistance Bed Mobility: Supine to Sit     Supine to sit: Mod assist     General bed mobility comments: Increased time to complete. Mod A for trunk elevation due to fatigue.    Transfers Overall transfer level: Needs assistance Equipment used: None Transfers: Bed to chair/wheelchair/BSC         Anterior-Posterior transfers: +2 physical assistance, Mod assist   General transfer comment: +2 Mod  A to scoot hips back into chair via AP transfer.    Ambulation/Gait               General Gait Details: Unable due to WB status.   Stairs             Wheelchair Mobility     Tilt Bed    Modified Rankin (Stroke Patients Only)       Balance Overall balance assessment: Needs assistance Sitting-balance support: Feet unsupported, No upper extremity supported Sitting balance-Leahy Scale: Fair Sitting balance - Comments: seated EOB; is able to shift weight throughout lateral scoots w/ BUE support and no losses of balance noted                                    Communication Communication Communication: No apparent difficulties  Cognition Arousal: Alert Behavior During Therapy: WFL for tasks assessed/performed   PT - Cognitive impairments: No apparent impairments                         Following commands: Intact      Cueing Cueing Techniques: Verbal cues, Visual cues, Gestural cues  Exercises General Exercises - Upper Extremity Chair Push Up: AROM, Strengthening, Both, 10 reps, Seated    General Comments General comments (skin integrity, edema, etc.): VSS. Family present and a bit overbearing at times      Pertinent Vitals/Pain Pain Assessment Pain Assessment: Faces Faces Pain Scale: Hurts a little bit Pain Location: bilateral LE, bilateral UE, ribs, neck Pain Descriptors / Indicators: Sore, Grimacing Pain Intervention(s):  Monitored during session, Repositioned, Limited activity within patient's tolerance    Home Living                          Prior Function            PT Goals (current goals can now be found in the care plan section) Progress towards PT goals: Progressing toward goals    Frequency    Min 2X/week      PT Plan      Co-evaluation              AM-PAC PT 6 Clicks Mobility   Outcome Measure  Help needed turning from your back to your side while in a flat bed without using  bedrails?: A Little Help needed moving from lying on your back to sitting on the side of a flat bed without using bedrails?: A Little Help needed moving to and from a bed to a chair (including a wheelchair)?: A Lot Help needed standing up from a chair using your arms (e.g., wheelchair or bedside chair)?: Total Help needed to walk in hospital room?: Total Help needed climbing 3-5 steps with a railing? : Total 6 Click Score: 11    End of Session   Activity Tolerance: Patient tolerated treatment well Patient left: in chair;with call bell/phone within reach;with chair alarm set;with family/visitor present Nurse Communication: Mobility status PT Visit Diagnosis: Muscle weakness (generalized) (M62.81);History of falling (Z91.81);Pain Pain - part of body: Arm;Leg;Ankle and joints of foot     Time: 1021-1107 PT Time Calculation (min) (ACUTE ONLY): 46 min  Charges:    $Therapeutic Activity: 23-37 mins PT General Charges $$ ACUTE PT VISIT: 1 Visit                     Sueellen NOVAK, PT, DPT Acute Rehab Services [phone]    [name] 01/15/2024, 12:27 PM

## 2024-01-15 NOTE — Plan of Care (Signed)
  Problem: Education: Goal: Knowledge of General Education information will improve Description: Including pain rating scale, medication(s)/side effects and non-pharmacologic comfort measures 01/15/2024 0723 by Delman Pao, RN Outcome: Progressing 01/15/2024 0717 by Delman Pao, RN Outcome: Progressing   Problem: Health Behavior/Discharge Planning: Goal: Ability to manage health-related needs will improve 01/15/2024 0723 by Delman Pao, RN Outcome: Progressing 01/15/2024 0717 by Delman Pao, RN Outcome: Progressing   Problem: Clinical Measurements: Goal: Ability to maintain clinical measurements within normal limits will improve 01/15/2024 0723 by Delman Pao, RN Outcome: Progressing 01/15/2024 0717 by Delman Pao, RN Outcome: Progressing Goal: Will remain free from infection 01/15/2024 0723 by Delman Pao, RN Outcome: Progressing 01/15/2024 0717 by Delman Pao, RN Outcome: Progressing Goal: Diagnostic test results will improve 01/15/2024 0723 by Delman Pao, RN Outcome: Progressing 01/15/2024 0717 by Delman Pao, RN Outcome: Progressing Goal: Respiratory complications will improve 01/15/2024 0723 by Delman Pao, RN Outcome: Progressing 01/15/2024 0717 by Delman Pao, RN Outcome: Progressing Goal: Cardiovascular complication will be avoided 01/15/2024 0723 by Delman Pao, RN Outcome: Progressing 01/15/2024 0717 by Delman Pao, RN Outcome: Progressing   Problem: Activity: Goal: Risk for activity intolerance will decrease 01/15/2024 0723 by Delman Pao, RN Outcome: Progressing 01/15/2024 0717 by Delman Pao, RN Outcome: Progressing   Problem: Nutrition: Goal: Adequate nutrition will be maintained 01/15/2024 0723 by Delman Pao, RN Outcome: Progressing 01/15/2024 0717 by Delman Pao, RN Outcome: Progressing   Problem: Coping: Goal: Level of anxiety will decrease 01/15/2024 0723 by Delman Pao, RN Outcome: Progressing 01/15/2024 0717 by  Delman Pao, RN Outcome: Progressing   Problem: Elimination: Goal: Will not experience complications related to bowel motility 01/15/2024 0723 by Delman Pao, RN Outcome: Progressing 01/15/2024 0717 by Delman Pao, RN Outcome: Progressing Goal: Will not experience complications related to urinary retention 01/15/2024 0723 by Delman Pao, RN Outcome: Progressing 01/15/2024 0717 by Delman Pao, RN Outcome: Progressing   Problem: Pain Managment: Goal: General experience of comfort will improve and/or be controlled 01/15/2024 0723 by Delman Pao, RN Outcome: Progressing 01/15/2024 0717 by Delman Pao, RN Outcome: Progressing   Problem: Safety: Goal: Ability to remain free from injury will improve 01/15/2024 0723 by Delman Pao, RN Outcome: Progressing 01/15/2024 0717 by Delman Pao, RN Outcome: Progressing   Problem: Skin Integrity: Goal: Risk for impaired skin integrity will decrease 01/15/2024 0723 by Delman Pao, RN Outcome: Progressing 01/15/2024 0717 by Delman Pao, RN Outcome: Progressing

## 2024-01-15 NOTE — TOC CAGE-AID Note (Signed)
 Transition of Care [facility]) - CAGE-AID Screening   Patient Details  Name: Monica Williamson MRN: [id number] Date of Birth: 1941/05/10  Transition [facility]) CM/SW Contact:    Jaquavis Felmlee E Jerrik Housholder, LCSW Phone Number: 01/15/2024, 10:55 AM   Clinical Narrative:    CAGE-AID Screening:    Have You Ever Felt You Ought to Cut Down on Your Drinking or Drug Use?: No Have People Annoyed You By [employer] Your Drinking Or Drug Use?: No Have You Felt Bad Or Guilty About Your Drinking Or Drug Use?: No Have You Ever Had a Drink or Used Drugs First Thing In The Morning to Steady Your Nerves or to Get Rid of a Hangover?: No CAGE-AID Score: 0  Substance Abuse Education Offered: No

## 2024-01-15 NOTE — Plan of Care (Signed)
  Problem: Education: Goal: Knowledge of General Education information will improve Description: Including pain rating scale, medication(s)/side effects and non-pharmacologic comfort measures Outcome: Progressing   Problem: Health Behavior/Discharge Planning: Goal: Ability to manage health-related needs will improve Outcome: Progressing   Problem: Clinical Measurements: Goal: Ability to maintain clinical measurements within normal limits will improve Outcome: Progressing Goal: Will remain free from infection Outcome: Progressing Goal: Diagnostic test results will improve Outcome: Progressing Goal: Respiratory complications will improve Outcome: Progressing Goal: Cardiovascular complication will be avoided Outcome: Progressing   Problem: Activity: Goal: Risk for activity intolerance will decrease Outcome: Progressing   Problem: Nutrition: Goal: Adequate nutrition will be maintained Outcome: Progressing   Problem: Coping: Goal: Level of anxiety will decrease Outcome: Progressing   Problem: Elimination: Goal: Will not experience complications related to bowel motility Outcome: Progressing Goal: Will not experience complications related to urinary retention Outcome: Progressing   Problem: Pain Managment: Goal: General experience of comfort will improve and/or be controlled Outcome: Progressing   Problem: Safety: Goal: Ability to remain free from injury will improve Outcome: Progressing   Problem: Skin Integrity: Goal: Risk for impaired skin integrity will decrease Outcome: Progressing  0700 Pt Medicated for pain during shift bilat ankle fractures right more swollen than left and cooler to tough, pt given Dilaudid  1 mg slow ivp this am at 545am for 10/10 ankle pain, ice pack applied to ankles, elevated on pillows.  0630 pain reassessed and patient stated pain level decreasing to 6 or 7 and better.

## 2024-01-15 NOTE — TOC Progression Note (Signed)
 Transition of Care [facility]) - Progression Note    Patient Details  Name: Monica Williamson MRN: [id number] Date of Birth: 03/03/1941  Transition of Care [facility]) CM/SW Contact  Inocente GORMAN Kindle, LCSW Phone Number: 01/15/2024, 4:51 PM  Clinical Narrative:    CSW spoke with patient's daughter, Leita, and confirmed plan for SNF rehab. She confirmed she would like [facility] or [facility]. CSW made her aware that per [facility] they do not accept patient's insurance. She reported understanding and would like to proceed with [facility]. She stated family will have 24/7 in home caregivers arranged after rehab at [facility] and is hoping patient will not need to stay the full 20 days once she is able to transfer to a bedside commode and to a chair since she will be non-weight bearing. CSW will begin insurance process once medically appropriate.     Skilled Nursing Rehab Facilities-   [email or web address]   Ratings out of 5 stars (5 the highest)  Name Address  Phone # Quality Care Staffing Health Inspection Overall  [facility] [address] [phone] 3 1 4 3   [facility] [address] [place] [phone] 5 2 4 5   [facility] [address], [facility] [phone] [facility] [address], [place] [phone] 4 3 4 4   [facility]  [address], [place] [phone] 5 3 5 5   [facility] [address], [facility] [phone] 5 3 2 3   [facility] [address] [place] [phone] 5 1 2 2   [facility] [address], [place] [phone] 2 3 3 3   [address] ([place]) [address] [place] [phone] 1 3 3 2   [facility] [address] [place], [place] [phone] 3 1 2 1   [facility] ([place]) [address], [place] [phone] 3 1 1 1   Clotilda Pereyra [address] [place] [phone] 3 3 4 4   [facility]  [address] [place] [phone] 4 4 3 3   [facility]) [address], [place] [phone] 1 1 4 2           [facility] [address], [place] [phone] 3 1 5 4   [facility] [address] [place] [phone] 4 1 1 1   [facility]  [address] [place] [phone] 2 3 2 2   [facility] [address] [phone] 2 2 4 4   [facility] [address], [place] [phone] 2 2 3 3           [facility] [address], [place] [phone] 2 1 2 1   [facility] (No [facility]) [address], [place] [phone] 5 4 5 5   [facility] [address] [facility] [phone] 3 4 2 2   [facility] [address] [place] [phone] 3 1 2 1   Marionette Milian [address] [place] [phone] 3 2 2 2   [facility] [address] [place] [phone] 1 3 3 2   [name] [address], [place] [phone] 2 2 3 3   [facility] [address] [place], [place] [phone] 2 1 4 3   [facility] [address], [facility] [phone] 2 1 1 1   [facility] [address] [place], [place] [phone] 2 1 2 1   [facility] [address] [place] [phone] 3 1 2  1  [facility] [address], [place] [phone] 4 1 2 1   [facility] [address], [place]  [phone] 3 4 4 4   [facility] (No Hum[employer]) [address] [place] [phone] 3 2 4 4   [facility]) [address], [place] [phone] 2 2 3 3   [facility]  [address] [place] [phone] 5 3 5 5   [facility] [address] [place], [place] [phone] 2 1 1 1   [facility] [address] [place], [place] [phone] 3 5 5 5           [facility] [address] [place], [place] [phone] 5 4 5 5   [facility])  [address],  [place] [phone] 1 1 2 1   [facility]) [address], [place] [phone]  2 4 4   [facility] [address], [place] [phone] 3 5 5 5   [address] [place], [place] [phone] 4 2 2 2   [facility]) [address] [place] [phone] 1 1 3 1       Expected Discharge Plan: Skilled Nursing Facility Barriers to Discharge: Continued Medical Work up, English as a second language teacher  Expected Discharge Plan and Services In-house Referral: Clinical Social Work   Post Acute Care Choice: Skilled Nursing Facility Living arrangements for the past 2 months: Single Family Home                                       Social Determinants of Health (SDOH) Interventions SDOH Screenings   Food Insecurity: No Food Insecurity (01/14/2024)  Housing: Unknown (01/14/2024)  Transportation Needs: No Transportation Needs (01/14/2024)  Utilities: Not At Risk (01/14/2024)  Social Connections: Socially Integrated (01/14/2024)  Tobacco Use: Medium Risk (01/13/2024)    Readmission Risk Interventions    10/11/2023    4:03 PM  Readmission Risk Prevention Plan  Post Dischage Appt Complete  Medication Screening Complete  Transportation Screening Complete

## 2024-01-15 NOTE — Evaluation (Signed)
 Occupational Therapy Evaluation Patient Details Name: Monica Williamson MRN: [id number] DOB: 05-05-1941 Today's Date: 01/15/2024   History of Present Illness   Pt is a 83 y.o. F presenting to [facility] following a fall from her attic. IMG shows bilateral ankle fx. PMH is significant for anxiety, A-fib, bronchiectasis, cholelithiasis, diverticulsosi, HTN, and OP.     Clinical Impressions Pt presents with decline in function and safety with ADLs and ADL mobility with impaired strength, balance and endurance. PTA pt lives with her husband and was Ind with ADLs, IADLs, home mgt, was driving and used no AD for mobility. Pt currently requires min A to sit EOB helicopter technique for mod d +2 AP transfer to recliner, CGA with UB ADLs, max - total A with LB ADLs, total A with toileting. Pt and family given extensive education on D/C planning and DME needs. Pt would benefit from acute OT services to address impairments to maximize level of function and safety     If plan is discharge home, recommend the following:   A lot of help with bathing/dressing/bathroom;A lot of help with walking and/or transfers;Help with stairs or ramp for entrance;Assist for transportation;Assistance with cooking/housework     Functional Status Assessment   Patient has had a recent decline in their functional status and demonstrates the ability to make significant improvements in function in a reasonable and predictable amount of time.     Equipment Recommendations   Wheelchair (measurements OT);Wheelchair cushion (measurements OT);Other (comment) (drop arm, wide BSC, long handle bath sponge, possibly hoyer)     Recommendations for Other Services         Precautions/Restrictions   Precautions Precautions: Fall Recall of Precautions/Restrictions: Intact Required Braces or Orthoses: Splint/Cast Splint/Cast: bilateral LE Splint/Cast - Date Prophylactic Dressing Applied (if applicable):  01/13/24 Restrictions Weight Bearing Restrictions Per Provider Order: Yes RLE Weight Bearing Per Provider Order: Non weight bearing LLE Weight Bearing Per Provider Order: Non weight bearing     Mobility Bed Mobility Overal bed mobility: Needs Assistance Bed Mobility: Supine to Sit     Supine to sit: Mod assist     General bed mobility comments: Increased time to complete. Mod A for trunk elevation and LE mgt due to fatigue.    Transfers Overall transfer level: Needs assistance Equipment used: None Transfers: Bed to chair/wheelchair/BSC             General transfer comment: +2 Mod A to scoot hips back into chair via AP transfer.      Balance Overall balance assessment: Needs assistance Sitting-balance support: Feet unsupported, No upper extremity supported Sitting balance-Leahy Scale: Fair Sitting balance - Comments: seated EOB; is able to shift weight throughout lateral scoots w/ BUE support and no losses of balance noted                                   ADL either performed or assessed with clinical judgement   ADL Overall ADL's : Needs assistance/impaired Eating/Feeding: Independent   Grooming: Wash/dry hands;Wash/dry face;Contact guard assist;Sitting Grooming Details (indicate cue type and reason): with UB/back support Upper Body Bathing: Contact guard assist;Sitting Upper Body Bathing Details (indicate cue type and reason): with back UB/support Lower Body Bathing: Maximal assistance;Sitting/lateral leans   Upper Body Dressing : Contact guard assist;Sitting Upper Body Dressing Details (indicate cue type and reason): with UB/back support Lower Body Dressing: Total assistance;Sitting/lateral leans       Toileting-  Clothing Manipulation and Hygiene: Total assistance;Bed level               Vision Baseline Vision/History: 1 Wears glasses Ability to See in Adequate Light: 0 Adequate Patient Visual Report: No change from baseline        Perception         Praxis         Pertinent Vitals/Pain Pain Assessment Pain Assessment: Faces Faces Pain Scale: Hurts a little bit Pain Location: bilateral LE, bilateral UE, ribs, neck Pain Descriptors / Indicators: Sore, Grimacing Pain Intervention(s): Monitored during session, Limited activity within patient's tolerance, Premedicated before session     Extremity/Trunk Assessment Upper Extremity Assessment Upper Extremity Assessment: Generalized weakness   Lower Extremity Assessment Lower Extremity Assessment: Defer to PT evaluation   Cervical / Trunk Assessment Cervical / Trunk Assessment: Kyphotic   Communication Communication Communication: No apparent difficulties Factors Affecting Communication: Hearing impaired (has hearing aid for R ear)   Cognition Arousal: Alert Behavior During Therapy: WFL for tasks assessed/performed                                 Following commands: Intact       Cueing  General Comments   Cueing Techniques: Verbal cues;Visual cues;Gestural cues  VSS. Family present and a bit overbearing at times   Exercises     Shoulder Instructions      Home Living Family/patient expects to be discharged to:: Private residence Living Arrangements: Spouse/significant other Available Help at Discharge: Family;Available 24 hours/day Type of Home: House Home Access: Stairs to enter [employer] of Steps: 4 Entrance Stairs-Rails: Right;Left Home Layout: One level     Bathroom Shower/Tub: Producer, television/film/video: Handicapped height Bathroom Accessibility: No   Home Equipment: Other (comment);Hand held shower head;Wheelchair - manual;BSC/3in1 ([employer])          Prior Functioning/Environment Prior Level of Function : Independent/Modified Independent;Driving             Mobility Comments: Ind with no ADs ADLs Comments: Ind with ADLs, IADLs, home mgt    OT Problem List: Decreased  strength;Decreased activity tolerance;Impaired balance (sitting and/or standing)   OT Treatment/Interventions: Self-care/ADL training;Therapeutic exercise;Patient/family education;Balance training;Therapeutic activities;DME and/or AE instruction      OT Goals(Current goals can be found in the care plan section)   Acute Rehab OT Goals Patient Stated Goal: go home OT Goal Formulation: With patient/family Time For Goal Achievement: 01/29/24 ADL Goals Pt Will Perform Grooming: with supervision;with set-up;sitting Pt Will Perform Upper Body Bathing: with supervision;with set-up;sitting Pt Will Perform Lower Body Bathing: with mod assist;sitting/lateral leans;with adaptive equipment Pt Will Perform Upper Body Dressing: with supervision;with set-up Pt Will Transfer to Toilet: with mod assist;with min assist;anterior/posterior transfer Pt Will Perform Toileting - Clothing Manipulation and hygiene: with max assist;with mod assist;sitting/lateral leans   OT Frequency:  Min 2X/week    Co-evaluation              AM-PAC OT 6 Clicks Daily Activity     Outcome Measure Help from another person eating meals?: None Help from another person taking care of personal grooming?: A Little Help from another person toileting, which includes using toliet, bedpan, or urinal?: Total Help from another person bathing (including washing, rinsing, drying)?: A Lot Help from another person to put on and taking off regular upper body clothing?: A Little Help from another person to put on  and taking off regular lower body clothing?: Total 6 Click Score: 14   End of Session Nurse Communication: Mobility status  Activity Tolerance: Patient limited by fatigue Patient left: in chair;with call bell/phone within reach;with family/visitor present  OT Visit Diagnosis: Other abnormalities of gait and mobility (R26.89);History of falling (Z91.81);Pain Pain - Right/Left:  (bilaterally) Pain - part of body:  Leg;Ankle and joints of foot                Time: 8984-8888 OT Time Calculation (min): 56 min Charges:  OT General Charges $OT Visit: 1 Visit OT Evaluation $OT Eval Moderate Complexity: 1 Mod OT Treatments $Self Care/Home Management : 8-22 mins    Jacques Karna Loose 01/15/2024, 1:30 PM

## 2024-01-15 NOTE — Progress Notes (Signed)
 Dressing to left head changed

## 2024-01-15 NOTE — Progress Notes (Addendum)
 PROGRESS NOTE    Monica Williamson  FMW:[id number] DOB: 04/30/1941 DOA: 01/13/2024 PCP: [name]., MD   Chief Complaint  Patient presents with   Fall    Brief Narrative:   Monica Williamson is a 83 y.o. female with medical history significant of atrial fibrillation, anxiety disorder, depression, essential hypertension, bronchiectasis, osteopenia, who sustained a fall through her attic and was brought to the ER with severe pain in her legs and her back.  She apparently was in the act over the garage when she missed a step and fell between the support beams of the roof on the ceiling.  She went through and landed on her husband's car in the garage.  Patient landed on her bilateral feet.  Workup in the ER showed bilateral calcaneus fractures.  Other imaging through the lumbar thoracic and cervical spine as well as bilateral hip showed no injury except left hip hematoma.  Patient was seen by orthopedic surgery.  Initial plan is for placement from the ER but due to severe pain which is uncontrolled, orthopedics recommended medical admission for pain management.  Physical therapy and Occupational Therapy evaluation and further disposition.    Assessment & Plan:   Principal Problem:   Fall Active Problems:   PAF (paroxysmal atrial fibrillation) (HCC)   Anxiety and depression  fall Bilateral calcaneus fractures Left hip hematoma Left nondisplaced fracture of medial malleolus Left parietal scalp wound -Patient presents due to fall from missing her step and falling from the attic . - She sustained bilateral calcaneus fracture, orthopedic input greatly appreciated, recommendation for nonoperative management and pain control currently, and will await further recommendation from foot ankle specialist at [facility].   -Discussed with patient and family, will try to transition from IV regimen to p.o. regimen, dose and frequency has been adjusted. . - Continue to monitor CBC closely  in the setting of left hip hematoma she will she is on full anticoagulation .  So far remains stable, with expected minimal drift down,, so far no indication for transfusion. -Continue with PT/OT. -Family would like to go home and to subacute rehab, discussed with PT/OT and social worker, discharge planning will geared towards home with home health. - Continue with wound care regarding her scalp hematoma, discussed with trauma surgery who reviewed imaging, the recommendation is to wash with soap and water if needed, I will put for bacitracin, covered with Xeroform and dressing changes twice daily. - He was encouraged to use incentive spirometer. - Will add Ensure  Transaminitis  - During routine labs her LFTs were noted to be elevated, no right upper quadrant tenderness, no evidence of trauma on imaging on presentation . - Negative hepatitis panel - For now DC Zetia  and statin  - Trending down, will continue to monitor closely  paroxysmal atrial fibrillation: - Hemoglobin stable, continue with Eliquis , monitor CBC closely    Anxiety with depression:  - Resume home regimen    DVT prophylaxis: Eliquis  Code Status: Full Family Communication: Discussed with husband at bedside and daughter by phone Disposition:   Status is: Inpatient   Consultants:  orthopedic   Subjective:  Pain is better controlled on current regimen  Objective: Vitals:   01/14/24 2317 01/15/24 0000 01/15/24 0341 01/15/24 0802  BP: 134/63 (!) 120/50 (!) 102/57 113/65  Pulse: 89 86 77 89  Resp: 20 20 16  (!) 9  Temp: 98.2 F (36.8 C)  98 F (36.7 C) 98.2 F (36.8 C)  TempSrc: Oral  Oral  Oral  SpO2: 93% 95% 96% 97%  Weight:      Height:        Intake/Output Summary (Last 24 hours) at 01/15/2024 1246 Last data filed at 01/15/2024 0803 Gross per 24 hour  Intake --  Output 800 ml  Net -800 ml   Filed Weights   01/13/24 0823  Weight: 64.9 kg    Examination:  Awake Alert, Oriented X 3, No new F.N  deficits, Normal affect, appears more comfortable today Symmetrical Chest wall movement, diminished air entry at the bases, she was encouraged use incentive spirometer RRR,No Gallops,Rubs or new Murmurs, No Parasternal Heave +ve B.Sounds, Abd Soft, No tenderness, No rebound - guarding or rigidity. Bilateral extremity in splints, she has good capillary refill, neurovascularly intact, able to ambulate her toes  - She remains with significant bruising, but appears stable in left elbow area, left hip area, left scalp wound with dried blood   Pictures on  6/30             Data Reviewed: I have personally reviewed following labs and imaging studies  CBC: Recent Labs  Lab 01/13/24 0943 01/13/24 1830 01/14/24 1028 01/15/24 0605  WBC 14.7* 10.6* 8.9 8.6  NEUTROABS 12.0*  --   --   --   HGB 13.3 11.6* 10.9* 9.9*  HCT 41.4 35.9* 34.2* 31.1*  MCV 101.7* 100.8* 102.7* 104.0*  PLT 226 217 217 172    Basic Metabolic Panel: Recent Labs  Lab 01/13/24 0943 01/14/24 1028 01/15/24 0605  NA 141 139 135  K 4.5 4.6 4.6  CL 107 105 102  CO2 27 26 24   GLUCOSE 117* 113* 101*  BUN 15 14 15   CREATININE 1.00 1.02* 0.86  CALCIUM  9.3 8.1* 8.1*    GFR: Estimated Creatinine Clearance: 44.8 mL/min (by C-G formula based on SCr of 0.86 mg/dL).  Liver Function Tests: Recent Labs  Lab 01/14/24 1028 01/15/24 0605  AST 370* 239*  ALT 301* 265*  ALKPHOS 158* 160*  BILITOT 1.1 1.1  PROT 5.4* 5.2*  ALBUMIN 3.1* 2.8*    CBG: No results for input(s): GLUCAP in the last 168 hours.   No results found for this or any previous visit (from the past 240 hours).       Radiology Studies: No results found.       Scheduled Meds:  apixaban   5 mg Oral BID   buPROPion   150 mg Oral q morning   cholecalciferol  1,000 Units Oral Daily   diltiazem   180 mg Oral Daily   feeding supplement  237 mL Oral BID BM   fiber   Oral Daily   loratadine   10 mg Oral Daily   metoprolol  tartrate   50 mg Oral BID   multivitamin with minerals  1 tablet Oral Daily   senna  2 tablet Oral Daily   Continuous Infusions:     LOS: 2 days       Brayton Lye, MD [facility]   To contact the attending provider between 7A-7P or the covering provider during after hours 7P-7A, please log into the web site [email or web address] and access using universal [place] password for that web site. If you do not have the password, please call the hospital operator.  01/15/2024, 12:46 PM

## 2024-01-16 DIAGNOSIS — W19XXXS Unspecified fall, sequela: Secondary | ICD-10-CM | POA: Diagnosis not present

## 2024-01-16 DIAGNOSIS — S92001A Unspecified fracture of right calcaneus, initial encounter for closed fracture: Secondary | ICD-10-CM | POA: Diagnosis not present

## 2024-01-16 LAB — COMPREHENSIVE METABOLIC PANEL WITH GFR
ALT: 166 U/L — ABNORMAL HIGH (ref 0–44)
AST: 113 U/L — ABNORMAL HIGH (ref 15–41)
Albumin: 2.6 g/dL — ABNORMAL LOW (ref 3.5–5.0)
Alkaline Phosphatase: 131 U/L — ABNORMAL HIGH (ref 38–126)
Anion gap: 8 (ref 5–15)
BUN: 13 mg/dL (ref 8–23)
CO2: 27 mmol/L (ref 22–32)
Calcium: 8.5 mg/dL — ABNORMAL LOW (ref 8.9–10.3)
Chloride: 100 mmol/L (ref 98–111)
Creatinine, Ser: 0.71 mg/dL (ref 0.44–1.00)
GFR, Estimated: >60 mL/min (ref >60)
Glucose, Bld: 111 mg/dL — ABNORMAL HIGH (ref 70–99)
Potassium: 4.2 mmol/L (ref 3.5–5.1)
Sodium: 135 mmol/L (ref 135–145)
Total Bilirubin: 0.6 mg/dL (ref 0.0–1.2)
Total Protein: 5.1 g/dL — ABNORMAL LOW (ref 6.5–8.1)

## 2024-01-16 LAB — PHOSPHORUS: Phosphorus: 1.4 mg/dL — ABNORMAL LOW (ref 2.5–4.6)

## 2024-01-16 LAB — MAGNESIUM: Magnesium: 1.8 mg/dL (ref 1.7–2.4)

## 2024-01-16 LAB — VITAMIN B12: Vitamin B-12: 290 pg/mL (ref 180–914)

## 2024-01-16 LAB — CBC
HCT: 29.3 % — ABNORMAL LOW (ref 36.0–46.0)
Hemoglobin: 9.5 g/dL — ABNORMAL LOW (ref 12.0–15.0)
MCH: 32.3 pg (ref 26.0–34.0)
MCHC: 32.4 g/dL (ref 30.0–36.0)
MCV: 99.7 fL (ref 80.0–100.0)
Platelets: 147 10*3/uL — ABNORMAL LOW (ref 150–400)
RBC: 2.94 MIL/uL — ABNORMAL LOW (ref 3.87–5.11)
RDW: 15.1 % (ref 11.5–15.5)
WBC: 8.1 10*3/uL (ref 4.0–10.5)
nRBC: 0 % (ref 0.0–0.2)

## 2024-01-16 LAB — URINALYSIS, ROUTINE W REFLEX MICROSCOPIC
Bilirubin Urine: NEGATIVE
Glucose, UA: NEGATIVE mg/dL
Hgb urine dipstick: NEGATIVE
Ketones, ur: NEGATIVE mg/dL
Leukocytes,Ua: NEGATIVE
Nitrite: NEGATIVE
Protein, ur: NEGATIVE mg/dL
Specific Gravity, Urine: 1.019 (ref 1.005–1.030)
pH: 5 (ref 5.0–8.0)

## 2024-01-16 LAB — FOLATE: Folate: 10.4 ng/mL (ref >5.9)

## 2024-01-16 MED ORDER — BACITRACIN ZINC 500 UNIT/GM EX OINT
TOPICAL_OINTMENT | Freq: Every day | CUTANEOUS | Status: DC
  Administered 2024-01-16: 31.5 via TOPICAL
  Filled 2024-01-16: qty 28.4

## 2024-01-16 MED ORDER — SODIUM PHOSPHATES 45 MMOLE/15ML IV SOLN
30.0000 mmol | Freq: Once | INTRAVENOUS | Status: AC
  Administered 2024-01-16: 30 mmol via INTRAVENOUS
  Filled 2024-01-16: qty 10

## 2024-01-16 NOTE — Progress Notes (Addendum)
 PROGRESS NOTE    Monica Williamson  FMW:[id number] DOB: November 10, 1940 DOA: 01/13/2024 PCP: [name]., MD   Chief Complaint  Patient presents with   Fall    Brief Narrative:   Monica Williamson is a 83 y.o. female with medical history significant of atrial fibrillation, anxiety disorder, depression, essential hypertension, bronchiectasis, osteopenia, who sustained a fall through her attic and was brought to the ER with severe pain in her legs and her back.  She apparently was in the act over the garage when she missed a step and fell between the support beams of the roof on the ceiling.  She went through and landed on her husband's car in the garage.  Patient landed on her bilateral feet.  Workup in the ER showed bilateral calcaneus fractures.  Other imaging through the lumbar thoracic and cervical spine as well as bilateral hip showed no injury except left hip hematoma.  Patient was seen by orthopedic surgery.  Initial plan is for placement from the ER but due to severe pain which is uncontrolled, orthopedics recommended medical admission for pain management.  Physical therapy and Occupational Therapy evaluation and further disposition.    Assessment & Plan:   Principal Problem:   Fall Active Problems:   PAF (paroxysmal atrial fibrillation) (HCC)   Anxiety and depression  fall Bilateral calcaneus fractures Left hip hematoma Left nondisplaced fracture of medial malleolus Left parietal scalp wound -Patient presents due to fall from missing her step and falling from the attic . - She sustained bilateral calcaneus fracture, orthopedic input greatly appreciated, Dr. Sharl recommendation for nonoperative management and pain control currently, I have discussed with foot ankle specialist at [facility] Dr. Kit  who recommends further management as well, and to follow-up with him in the clinic in 7 to 10 days from discharge. -Discussed with patient and family, will try to  transition from IV regimen to p.o. regimen, dose and frequency has been adjusted. . - Continue to monitor CBC closely in the setting of left hip hematoma she will she is on full anticoagulation .  So far remains stable, with expected minimal drift down,, so far no indication for transfusion. -Continue with PT/OT. - Continue with wound care regarding her scalp hematoma, discussed with trauma surgery who reviewed imaging, the recommendation is to wash with soap and water if needed, continue with bacitracin, covered with Xeroform and dressing changes twice daily. - He was encouraged to use incentive spirometer. - Continue with Ensure  Transaminitis  - During routine labs her LFTs were noted to be elevated, no right upper quadrant tenderness, no evidence of trauma on imaging on presentation . - Negative hepatitis panel - For now DC Zetia  and statin  - Trending down, will continue to monitor closely  paroxysmal atrial fibrillation: - Hemoglobin stable, continue with Eliquis , monitor CBC closely    Anxiety with depression:  - Resume home regimen    DVT prophylaxis: Eliquis  Code Status: Full Family Communication: Discussed with daughter at bedside.  Daughter by phone Disposition:   Status is: Inpatient   Consultants:  orthopedic   Subjective:  Good BM yesterday, reports she is feeling better, pain is controlled on oral regimen  Objective: Vitals:   01/16/24 0400 01/16/24 0820 01/16/24 0912 01/16/24 1420  BP: 125/68 132/74 132/74 (!) 131/55  Pulse: (!) 108 94 99 74  Resp: 19 (!) 23    Temp: 98.5 F (36.9 C) 98.7 F (37.1 C)  97.9 F (36.6 C)  TempSrc: Oral  Oral  Oral  SpO2: 98% 93%  96%  Weight:      Height:       No intake or output data in the 24 hours ending 01/16/24 1430  Filed Weights   01/13/24 0823  Weight: 64.9 kg    Examination:  Awake Alert, Oriented X 3, No new F.N deficits, Normal affect,  Good air entry today  Regular rate and rhythm  Abdomen soft   Bilateral lower extremity in splints, good capillary refills, neurovascularly intact, . - bruising seems stable in left hip area, left elbow area, left scalp wound with dressing applied .   Pictures on  6/30             Data Reviewed: I have personally reviewed following labs and imaging studies  CBC: Recent Labs  Lab 01/13/24 0943 01/13/24 1830 01/14/24 1028 01/15/24 0605 01/16/24 0537  WBC 14.7* 10.6* 8.9 8.6 8.1  NEUTROABS 12.0*  --   --   --   --   HGB 13.3 11.6* 10.9* 9.9* 9.5*  HCT 41.4 35.9* 34.2* 31.1* 29.3*  MCV 101.7* 100.8* 102.7* 104.0* 99.7  PLT 226 217 217 172 147*    Basic Metabolic Panel: Recent Labs  Lab 01/13/24 0943 01/14/24 1028 01/15/24 0605 01/16/24 0537  NA 141 139 135 135  K 4.5 4.6 4.6 4.2  CL 107 105 102 100  CO2 27 26 24 27   GLUCOSE 117* 113* 101* 111*  BUN 15 14 15 13   CREATININE 1.00 1.02* 0.86 0.71  CALCIUM  9.3 8.1* 8.1* 8.5*  MG  --   --   --  1.8  PHOS  --   --   --  1.4*    GFR: Estimated Creatinine Clearance: 48.1 mL/min (by C-G formula based on SCr of 0.71 mg/dL).  Liver Function Tests: Recent Labs  Lab 01/14/24 1028 01/15/24 0605 01/16/24 0537  AST 370* 239* 113*  ALT 301* 265* 166*  ALKPHOS 158* 160* 131*  BILITOT 1.1 1.1 0.6  PROT 5.4* 5.2* 5.1*  ALBUMIN 3.1* 2.8* 2.6*    CBG: No results for input(s): GLUCAP in the last 168 hours.   No results found for this or any previous visit (from the past 240 hours).       Radiology Studies: No results found.       Scheduled Meds:  apixaban   5 mg Oral BID   bacitracin   Topical Daily   buPROPion   150 mg Oral q morning   cholecalciferol  1,000 Units Oral Daily   diltiazem   180 mg Oral Daily   feeding supplement  237 mL Oral TID BM   fiber   Oral Daily   loratadine   10 mg Oral Daily   metoprolol  tartrate  50 mg Oral BID   multivitamin with minerals  1 tablet Oral Daily   senna  2 tablet Oral Daily   Continuous Infusions:     LOS: 3  days       Brayton Lye, MD [facility]   To contact the attending provider between 7A-7P or the covering provider during after hours 7P-7A, please log into the web site [email or web address] and access using universal [place] password for that web site. If you do not have the password, please call the hospital operator.  01/16/2024, 2:30 PM

## 2024-01-16 NOTE — Care Management Important Message (Signed)
 Important Message  Patient Details  Name: Monica Williamson MRN: [id number] Date of Birth: [birth year]   Important Message Given:  Yes - Medicare IM     Claretta Deed 01/16/2024, 3:39 PM

## 2024-01-16 NOTE — Plan of Care (Signed)

## 2024-01-16 NOTE — Progress Notes (Signed)
 Physical Therapy Treatment Patient Details Name: Monica Williamson MRN: [id number] DOB: August 21, 1940 Today's Date: 01/16/2024   History of Present Illness Pt is a 83 y.o. F presenting to [facility] following a fall from her attic. IMG shows bilateral ankle fx. PMH is significant for anxiety, A-fib, bronchiectasis, cholelithiasis, diverticulsosi, HTN, and OP.    PT Comments  Pt tolerated treatment well today. Pt with similar presentation to previous session. Pt was able to AP transfer into chair with Min A. No change in DC/DME recs at this time. Plan to practice WC mobility next session. PT will continue to follow.     If plan is discharge home, recommend the following: A lot of help with walking and/or transfers;A lot of help with bathing/dressing/bathroom;Assistance with cooking/housework;Assist for transportation;Help with stairs or ramp for entrance   Can travel by private vehicle     No  Equipment Recommendations  Hospital bed;Hoyer lift;Other (comment);BSC/3in1 ([place])    Recommendations for Other Services       Precautions / Restrictions Precautions Precautions: Fall Recall of Precautions/Restrictions: Intact Splint/Cast: bilateral LE Splint/Cast - Date Prophylactic Dressing Applied (if applicable): 01/13/24 Restrictions Weight Bearing Restrictions Per Provider Order: Yes RLE Weight Bearing Per Provider Order: Non weight bearing LLE Weight Bearing Per Provider Order: Non weight bearing     Mobility  Bed Mobility Overal bed mobility: Needs Assistance Bed Mobility: Supine to Sit     Supine to sit: Min assist     General bed mobility comments: Increased time to complete. Min A to scoot hips.    Transfers Overall transfer level: Needs assistance Equipment used: None Transfers: Bed to chair/wheelchair/BSC         Anterior-Posterior transfers: Min assist, +2 safety/equipment   General transfer comment: Min A to scoot hips back into chair via AP transfer. Pt with  several rest breaks during transfer.    Ambulation/Gait               General Gait Details: Unable due to WB status.   Stairs             Wheelchair Mobility     Tilt Bed    Modified Rankin (Stroke Patients Only)       Balance Overall balance assessment: Needs assistance Sitting-balance support: Feet unsupported, No upper extremity supported Sitting balance-Leahy Scale: Fair Sitting balance - Comments: seated EOB; is able to shift weight throughout lateral scoots w/ BUE support and no losses of balance noted                                    Communication Communication Communication: No apparent difficulties  Cognition Arousal: Alert Behavior During Therapy: WFL for tasks assessed/performed   PT - Cognitive impairments: No apparent impairments                         Following commands: Intact      Cueing Cueing Techniques: Verbal cues, Visual cues, Gestural cues  Exercises General Exercises - Upper Extremity Chair Push Up: AROM, Strengthening, Both, 10 reps, Seated    General Comments        Pertinent Vitals/Pain Pain Assessment Pain Assessment: Faces Faces Pain Scale: Hurts a little bit Pain Location: bilateral LE, bilateral UE, ribs, neck Pain Descriptors / Indicators: Sore, Grimacing    Home Living  Prior Function            PT Goals (current goals can now be found in the care plan section) Progress towards PT goals: Progressing toward goals    Frequency    Min 2X/week      PT Plan      Co-evaluation              AM-PAC PT 6 Clicks Mobility   Outcome Measure  Help needed turning from your back to your side while in a flat bed without using bedrails?: A Little Help needed moving from lying on your back to sitting on the side of a flat bed without using bedrails?: A Little Help needed moving to and from a bed to a chair (including a wheelchair)?: A  Lot Help needed standing up from a chair using your arms (e.g., wheelchair or bedside chair)?: Total Help needed to walk in hospital room?: Total Help needed climbing 3-5 steps with a railing? : Total 6 Click Score: 11    End of Session   Activity Tolerance: Patient tolerated treatment well Patient left: in chair;with call bell/phone within reach;with chair alarm set;with family/visitor present Nurse Communication: Mobility status PT Visit Diagnosis: Muscle weakness (generalized) (M62.81);History of falling (Z91.81);Pain Pain - part of body: Arm;Leg;Ankle and joints of foot     Time: 0900-0919 PT Time Calculation (min) (ACUTE ONLY): 19 min  Charges:    $Therapeutic Activity: 8-22 mins PT General Charges $$ ACUTE PT VISIT: 1 Visit                     Clovia Reine B, PT, DPT Acute Rehab Services [phone]    Si Jachim 01/16/2024, 1:26 PM

## 2024-01-16 NOTE — TOC Progression Note (Signed)
 Transition of Care [facility]) - Progression Note    Patient Details  Name: KEYDI GIEL MRN: [id number] Date of Birth: March 20, 1941  Transition of Care [facility]) CM/SW Contact  Orazio Weller LITTIE Moose, LCSW Phone Number: 01/16/2024, 4:02 PM  Clinical Narrative:    CSW spoke with pt daughter, Leita, regarding pt d/c plan. Leita is in agreement with d/c plan and would like to use [facility] to transport her mother to [facility] when ready for d/c.   Expected Discharge Plan: Skilled Nursing Facility Barriers to Discharge: Continued Medical Work up, English as a second language teacher  Expected Discharge Plan and Services In-house Referral: Clinical Social Work   Post Acute Care Choice: Skilled Nursing Facility Living arrangements for the past 2 months: Single Family Home                                       Social Determinants of Health (SDOH) Interventions SDOH Screenings   Food Insecurity: No Food Insecurity (01/14/2024)  Housing: Unknown (01/14/2024)  Transportation Needs: No Transportation Needs (01/14/2024)  Utilities: Not At Risk (01/14/2024)  Social Connections: Socially Integrated (01/14/2024)  Tobacco Use: Medium Risk (01/13/2024)    Readmission Risk Interventions    10/11/2023    4:03 PM  Readmission Risk Prevention Plan  Post Dischage Appt Complete  Medication Screening Complete  Transportation Screening Complete

## 2024-01-16 NOTE — Progress Notes (Signed)
   01/16/24 1152  Mobility  Activity Repositioned in chair  Level of Assistance Minimal assist, patient does 75% or more  Assistive Device  (Armrests)  Range of Motion/Exercises Active;All extremities  RLE Weight Bearing Per Provider Order NWB  LLE Weight Bearing Per Provider Order NWB  Activity Response Tolerated well  Mobility Referral Yes  Mobility visit 1 Mobility  Mobility Specialist Start Time (ACUTE ONLY) 1152  Mobility Specialist Stop Time (ACUTE ONLY) 1214  Mobility Specialist Time Calculation (min) (ACUTE ONLY) 22 min   Mobility Specialist: Progress Note  Post-Mobility:    HR 86, SpO2 92% RA  Pt agreeable to mobility session - received in chair. C/o Upper BUE pain. Returned to chair with all needs met - call bell within reach.    Exercises (x10): chair pushups, bicep curls, shoulder shrugs, arm raises, leg raises, marches.   Virgle Boards, BS Mobility Specialist Please contact via SecureChat or  Rehab office at [phone].

## 2024-01-16 NOTE — Plan of Care (Signed)
  Problem: Education: Goal: Knowledge of General Education information will improve Description: Including pain rating scale, medication(s)/side effects and non-pharmacologic comfort measures Outcome: Progressing   Problem: Health Behavior/Discharge Planning: Goal: Ability to manage health-related needs will improve Outcome: Progressing   Problem: Clinical Measurements: Goal: Ability to maintain clinical measurements within normal limits will improve Outcome: Progressing Goal: Will remain free from infection Outcome: Progressing Goal: Diagnostic test results will improve Outcome: Progressing Goal: Respiratory complications will improve Outcome: Progressing Goal: Cardiovascular complication will be avoided Outcome: Progressing   Problem: Activity: Goal: Risk for activity intolerance will decrease Outcome: Progressing   Problem: Nutrition: Goal: Adequate nutrition will be maintained Outcome: Progressing   Problem: Coping: Goal: Level of anxiety will decrease Outcome: Progressing   Problem: Elimination: Goal: Will not experience complications related to bowel motility Outcome: Progressing Goal: Will not experience complications related to urinary retention Outcome: Progressing   Problem: Pain Managment: Goal: General experience of comfort will improve and/or be controlled Outcome: Progressing   Problem: Safety: Goal: Ability to remain free from injury will improve Outcome: Progressing   Problem: Skin Integrity: Goal: Risk for impaired skin integrity will decrease Outcome: Progressing  0700 Pt medicated x3 for pain, last dose given Dilaudid  0.5 mg slow IVP, pt o2 sats dropped briefly to high 80s, pt stable roused to voice and no other issues, BLE ankle fractures stable feet less swollen thought pt c/o intermittent pain able to move legs and toes.

## 2024-01-16 NOTE — TOC Progression Note (Signed)
 Transition of Care [facility]) - Progression Note    Patient Details  Name: Monica Williamson MRN: [id number] Date of Birth: [birth year]  Transition of Care [facility]) CM/SW Contact  Latoyna Hird LITTIE Moose, LCSW Phone Number: 01/16/2024, 2:17 PM  Clinical Narrative:    Therapist, occupational received for [place]. Auth number [id number] effective 7/2-01/19/24.   Expected Discharge Plan: Skilled Nursing Facility Barriers to Discharge: Continued Medical Work up, English as a second language teacher  Expected Discharge Plan and Services In-house Referral: Clinical Social Work   Post Acute Care Choice: Skilled Nursing Facility Living arrangements for the past 2 months: Single Family Home                                       Social Determinants of Health (SDOH) Interventions SDOH Screenings   Food Insecurity: No Food Insecurity (01/14/2024)  Housing: Unknown (01/14/2024)  Transportation Needs: No Transportation Needs (01/14/2024)  Utilities: Not At Risk (01/14/2024)  Social Connections: Socially Integrated (01/14/2024)  Tobacco Use: Medium Risk (01/13/2024)    Readmission Risk Interventions    10/11/2023    4:03 PM  Readmission Risk Prevention Plan  Post Dischage Appt Complete  Medication Screening Complete  Transportation Screening Complete

## 2024-01-17 DIAGNOSIS — W19XXXA Unspecified fall, initial encounter: Secondary | ICD-10-CM | POA: Diagnosis not present

## 2024-01-17 DIAGNOSIS — Y92009 Unspecified place in unspecified non-institutional (private) residence as the place of occurrence of the external cause: Secondary | ICD-10-CM

## 2024-01-17 DIAGNOSIS — I480 Paroxysmal atrial fibrillation: Secondary | ICD-10-CM | POA: Diagnosis not present

## 2024-01-17 DIAGNOSIS — S92001A Unspecified fracture of right calcaneus, initial encounter for closed fracture: Secondary | ICD-10-CM | POA: Diagnosis not present

## 2024-01-17 DIAGNOSIS — S8255XA Nondisplaced fracture of medial malleolus of left tibia, initial encounter for closed fracture: Secondary | ICD-10-CM | POA: Diagnosis not present

## 2024-01-17 LAB — COMPREHENSIVE METABOLIC PANEL WITH GFR
ALT: 110 U/L — ABNORMAL HIGH (ref 0–44)
AST: 65 U/L — ABNORMAL HIGH (ref 15–41)
Albumin: 2.4 g/dL — ABNORMAL LOW (ref 3.5–5.0)
Alkaline Phosphatase: 107 U/L (ref 38–126)
Anion gap: 10 (ref 5–15)
BUN: 13 mg/dL (ref 8–23)
CO2: 26 mmol/L (ref 22–32)
Calcium: 8.5 mg/dL — ABNORMAL LOW (ref 8.9–10.3)
Chloride: 99 mmol/L (ref 98–111)
Creatinine, Ser: 0.7 mg/dL (ref 0.44–1.00)
GFR, Estimated: >60 mL/min (ref >60)
Glucose, Bld: 113 mg/dL — ABNORMAL HIGH (ref 70–99)
Potassium: 4.1 mmol/L (ref 3.5–5.1)
Sodium: 135 mmol/L (ref 135–145)
Total Bilirubin: 0.8 mg/dL (ref 0.0–1.2)
Total Protein: 5 g/dL — ABNORMAL LOW (ref 6.5–8.1)

## 2024-01-17 LAB — CBC
HCT: 26.8 % — ABNORMAL LOW (ref 36.0–46.0)
Hemoglobin: 8.8 g/dL — ABNORMAL LOW (ref 12.0–15.0)
MCH: 32.7 pg (ref 26.0–34.0)
MCHC: 32.8 g/dL (ref 30.0–36.0)
MCV: 99.6 fL (ref 80.0–100.0)
Platelets: 177 10*3/uL (ref 150–400)
RBC: 2.69 MIL/uL — ABNORMAL LOW (ref 3.87–5.11)
RDW: 15.3 % (ref 11.5–15.5)
WBC: 8.1 10*3/uL (ref 4.0–10.5)
nRBC: 0 % (ref 0.0–0.2)

## 2024-01-17 LAB — PHOSPHORUS: Phosphorus: 3.7 mg/dL (ref 2.5–4.6)

## 2024-01-17 LAB — MAGNESIUM: Magnesium: 1.8 mg/dL (ref 1.7–2.4)

## 2024-01-17 MED ORDER — OXYCODONE HCL 5 MG PO TABS: 5.0000 mg | ORAL_TABLET | ORAL | 0 refills | Status: AC | PRN

## 2024-01-17 MED ORDER — ENSURE PLUS HIGH PROTEIN PO LIQD: 237.0000 mL | Freq: Three times a day (TID) | ORAL | Status: AC

## 2024-01-17 MED ORDER — ALPRAZOLAM 0.5 MG PO TABS
0.2500 mg | ORAL_TABLET | Freq: Every evening | ORAL | 0 refills | Status: AC | PRN
Start: 2024-01-17 — End: ?

## 2024-01-17 MED ORDER — BACITRACIN ZINC 500 UNIT/GM EX OINT: TOPICAL_OINTMENT | Freq: Every day | CUTANEOUS | Status: AC

## 2024-01-17 MED ORDER — VITAMIN B-12 1000 MCG PO TABS
1000.0000 ug | ORAL_TABLET | Freq: Every day | ORAL | Status: DC
  Administered 2024-01-17: 1000 ug via ORAL
  Filled 2024-01-17: qty 1

## 2024-01-17 MED ORDER — CYANOCOBALAMIN 1000 MCG PO TABS: 1000.0000 ug | ORAL_TABLET | Freq: Every day | ORAL | Status: AC

## 2024-01-17 MED ORDER — POLYETHYLENE GLYCOL 3350 17 G PO PACK: 17.0000 g | PACK | Freq: Every day | ORAL | Status: AC

## 2024-01-17 MED ORDER — SENNA 8.6 MG PO TABS: 2.0000 | ORAL_TABLET | Freq: Every day | ORAL | Status: AC

## 2024-01-17 NOTE — Progress Notes (Signed)
 Occupational Therapy Treatment Patient Details Name: Monica Williamson MRN: [id number] DOB: 10/04/1940 Today's Date: 01/17/2024   History of present illness Pt is a 83 y.o. F presenting to [facility] following a fall from her attic. IMG shows bilateral ankle fx. PMH is significant for anxiety, A-fib, bronchiectasis, cholelithiasis, diverticulsosi, HTN, and OP.   OT comments  Pt making good progress with functional goals. Pt required min A to scoot/rotate hips for AP transfer to [facility] min/mod A, toileting tasks CGA for clothing mgt and anterior hygiene, set up?sup for grooming/hygiene tasks ad mod A simulated LB bathing leaning side to side on BSC. Pt required mod with LEs to scoot from [facility] back onto bed. Pt eager to d/c to SNF for post acute rehab.       If plan is discharge home, recommend the following:  A lot of help with bathing/dressing/bathroom;A lot of help with walking and/or transfers;Help with stairs or ramp for entrance;Assist for transportation;Assistance with Charity fundraiser (measurements OT);Wheelchair cushion (measurements OT);Other (comment) (drop arm BSC, LH bath sponge)    Recommendations for Other Services      Precautions / Restrictions Precautions Precautions: Fall Recall of Precautions/Restrictions: Intact Required Braces or Orthoses: Splint/Cast Splint/Cast: bilateral LE Splint/Cast - Date Prophylactic Dressing Applied (if applicable): 01/13/24 Restrictions Weight Bearing Restrictions Per Provider Order: Yes RLE Weight Bearing Per Provider Order: Non weight bearing LLE Weight Bearing Per Provider Order: Non weight bearing       Mobility Bed Mobility Overal bed mobility: Needs Assistance Bed Mobility: Supine to Sit, Sit to Supine     Supine to sit: Min assist Sit to supine: Mod assist   General bed mobility comments: min A to scoot/rotate hips, increased time to complete    Transfers Overall transfer level: Needs  assistance Equipment used: None Transfers: Bed to chair/wheelchair/BSC         Anterior-Posterior transfers: Mod assist, Min assist   General transfer comment: min A AP transfer to BSC, mod A with LEs back onto bed and to rotate hips for supine min A +2     Balance Overall balance assessment: Needs assistance Sitting-balance support: Feet unsupported, No upper extremity supported                                       ADL either performed or assessed with clinical judgement   ADL Overall ADL's : Needs assistance/impaired     Grooming: Wash/dry hands;Wash/dry face;Sitting;Set up;Supervision/safety                   Toilet Transfer: Minimal assistance;Moderate assistance;Cueing for safety;Cueing for sequencing;BSC/3in1 Toilet Transfer Details (indicate cue type and reason): AP transfer Toileting- Clothing Manipulation and Hygiene: Contact guard assist;Sitting/lateral lean Toileting - Clothing Manipulation Details (indicate cue type and reason): clothing mgt and hygiene            Extremity/Trunk Assessment Upper Extremity Assessment Upper Extremity Assessment: Generalized weakness   Lower Extremity Assessment Lower Extremity Assessment: Defer to PT evaluation   Cervical / Trunk Assessment Cervical / Trunk Assessment: Kyphotic    Vision Baseline Vision/History: 1 Wears glasses Ability to See in Adequate Light: 0 Adequate Patient Visual Report: No change from baseline     Perception     Praxis     Communication Communication Communication: No apparent difficulties Factors Affecting Communication: Hearing impaired   Cognition Arousal: Alert Behavior During Therapy:  WFL for tasks assessed/performed                                 Following commands: Intact        Cueing   Cueing Techniques: Verbal cues, Visual cues, Gestural cues  Exercises Other Exercises Other Exercises: chair push ups 3 sets 5 reps to increase  tricep strenght for transfers    Shoulder Instructions       General Comments      Pertinent Vitals/ Pain       Pain Assessment Pain Assessment: Faces Faces Pain Scale: Hurts a little bit Pain Location: bilateral LE, bilateral UE, ribs, neck Pain Descriptors / Indicators: Sore, Grimacing Pain Intervention(s): Monitored during session, Premedicated before session, Repositioned  Home Living                                          Prior Functioning/Environment              Frequency  Min 2X/week        Progress Toward Goals  OT Goals(current goals can now be found in the care plan section)  Progress towards OT goals: Progressing toward goals     Plan      Co-evaluation                 AM-PAC OT 6 Clicks Daily Activity     Outcome Measure                    End of Session Equipment Utilized During Treatment: Other (comment) (BSC)  OT Visit Diagnosis: Other abnormalities of gait and mobility (R26.89);History of falling (Z91.81);Pain Pain - Right/Left:  (bilaterally) Pain - part of body: Leg;Ankle and joints of foot   Activity Tolerance Patient tolerated treatment well;Patient limited by fatigue   Patient Left with call bell/phone within reach;with family/visitor present;in bed   Nurse Communication Mobility status        Time: 8975-8940 OT Time Calculation (min): 35 min  Charges: OT General Charges $OT Visit: 1 Visit OT Treatments $Self Care/Home Management : 8-22 mins $Therapeutic Activity: 8-22 mins    Jacques Karna Loose 01/17/2024, 11:24 AM

## 2024-01-17 NOTE — Plan of Care (Signed)

## 2024-01-17 NOTE — Progress Notes (Signed)
 Report given to Rana LPN at [facility].

## 2024-01-17 NOTE — Progress Notes (Signed)
 Patient left unit via stretcher with transporters no c/o chest pain or SOB.

## 2024-01-17 NOTE — TOC Transition Note (Signed)
 Transition of Care [facility]) - Discharge Note   Patient Details  Name: GIULIANA HANDYSIDE MRN: [id number] Date of Birth: [birth year]  Transition of Care [facility]) CM/SW Contact:  Jeoffrey LITTIE Moose, LCSW Phone Number: 01/17/2024, 1:58 PM   Clinical Narrative:    Patient will DC to:[facility] Anticipated DC date: 01/17/24 Family notified: Yes Transport by: [facility]   Per MD patient ready for DC to [facility] . RN to call report prior to discharge [phone]. RN, patient, patient's family, and facility notified of DC. Discharge Summary and FL2 sent to facility. DC packet on chart. Ambulance transport requested for patient.   CSW will sign off for now as social work intervention is no longer needed. Please consult us  again if new needs arise.       Barriers to Discharge: Continued Medical Work up, English as a second language teacher   Patient Goals and CMS Choice Patient states their goals for this hospitalization and ongoing recovery are:: TO return home CMS Medicare.gov Compare Post Acute Care list provided to:: Patient Represenative (must comment) Choice offered to / list presented to : Patient, Adult Children [place] ownership interest in [facility].provided to:: Adult Children    Discharge Placement                       Discharge Plan and Services Additional resources added to the After Visit Summary for   In-house Referral: Clinical Social Work   Post Acute Care Choice: Skilled Nursing Facility                               Social Drivers of Health (SDOH) Interventions SDOH Screenings   Food Insecurity: No Food Insecurity (01/14/2024)  Housing: Unknown (01/14/2024)  Transportation Needs: No Transportation Needs (01/14/2024)  Utilities: Not At Risk (01/14/2024)  Social Connections: Socially Integrated (01/14/2024)  Tobacco Use: Medium Risk (01/13/2024)     Readmission Risk Interventions    10/11/2023    4:03 PM  Readmission Risk Prevention Plan  Post  Dischage Appt Complete  Medication Screening Complete  Transportation Screening Complete

## 2024-01-17 NOTE — Plan of Care (Signed)

## 2024-01-17 NOTE — Discharge Summary (Signed)
 PATIENT DETAILS Name: Monica Williamson Age: 83 y.o. Sex: female Date of Birth: 02-28-1941 MRN: [id number]. Admitting Physician: Emery LITTIE Fuss, MD [name], Monica Williamson., MD  Admit Date: 01/13/2024 Discharge date: 01/17/2024  Recommendations for Outpatient Follow-up:  Follow up with PCP in 1-2 weeks Please obtain CMP/CBC in one week Please ensure follow-up with orthopedics-Dr. Kit. Repeat CT chest in 3 months-see below Repeat vitamin B12 levels in 6 weeks-see below Statin/Zetia  on hold due to elevated LFTs-resume when LFTs improve further.  Admitted From:  Home  Disposition: Skilled nursing facility   Discharge Condition: good  CODE STATUS:   Code Status: Full Code   Diet recommendation:  Diet Order             Diet - low sodium heart healthy           Diet Heart Room service appropriate? Yes; Fluid consistency: Thin  Diet effective now                    Brief Summary: 83 year old with history of PAF on Eliquis , anxiety/depression who sustained a mechanical fall through the sheet rock in her attic and landed in her garage on top of her spouses car-she was found to have bilateral calcaneal fractures, left hip hematoma, left nondisplaced fracture of the medial malleolus and left parietal scalp wound..  She was evaluated by orthopedics with recommendations for nonoperative management.  Brief Hospital Course: Mechanical fall with bilateral calcaneal fracture, left nondisplaced fracture of the medial malleolus, left hip hematoma, left parietal scalp wound Evaluated by orthopedics-nonoperative management recommended. Prior MD-discussed with trauma surgery-who reviewed imaging-recommendations were for supportive care-in regards to her scalp wound-recommendations were to wash with soap and water if needed, continue bacitracin and cover with Xeroform-change dressings twice daily. Please ensure follow-up with Dr. Hewitt-orthopedics in the next 1-2 weeks. Pain is  currently controlled with oral narcotic medications. Orthopedic recommending strict elevation with toes above the level of the nose.  Transaminitis Unclear etiology-acute hepatitis serology negative. LFTs downtrending Repeat LFTs in 1 week. Lipitor/Zetia  on hold-resume when able/LFTs improved further.  Borderline vitamin B12 deficiency Oral supplementation for now-repeat levels in 6 weeks-if still on the lower side-will need parenteral supplementation-defer further to PCP.  PAF Stable Continue metoprolol /Cardizem  and Eliquis .  Anxiety/depression Appears stable Continue Wellbutrin  Continue as needed Xanax .  Small nodular densities throughout both lungs Incidentally seen on CT chest-done for trauma workup Per radiology-recommendations are to repeat CT chest in 3 months to ensure resolution of these nodules.   Discharge Diagnoses:  Principal Problem:   Fall Active Problems:   PAF (paroxysmal atrial fibrillation) (HCC)   Anxiety and depression   Discharge Instructions:  Activity:  As tolerated with Full fall precautions use walker/cane & assistance as needed  Discharge Instructions     Diet - low sodium heart healthy   Complete by: As directed    Discharge instructions   Complete by: As directed    Follow with Primary MD  Loreli Monica Williamson., MD in 1-2 weeks  Please get a complete blood count and chemistry panel checked by your Primary MD at your next visit, and again as instructed by your Primary MD.  Get Medicines reviewed and adjusted: Please take all your medications with you for your next visit with your Primary MD  Laboratory/radiological data: Please request your Primary MD to go over all hospital tests and procedure/radiological results at the follow up, please ask your Primary MD to get all Hospital records  sent to his/her office.  In some cases, they will be blood work, cultures and biopsy results pending at the time of your discharge. Please request that  your primary care M.D. follows up on these results.  Also Note the following: If you experience worsening of your admission symptoms, develop shortness of breath, life threatening emergency, suicidal or homicidal thoughts you must seek medical attention immediately by calling 911 or calling your MD immediately  if symptoms less severe.  You must read complete instructions/literature along with all the possible adverse reactions/side effects for all the Medicines you take and that have been prescribed to you. Take any new Medicines after you have completely understood and accpet all the possible adverse reactions/side effects.   Do not drive when taking Pain medications or sleeping medications (Benzodaizepines)  Do not take more than prescribed Pain, Sleep and Anxiety Medications. It is not advisable to combine anxiety,sleep and pain medications without talking with your primary care practitioner  Special Instructions: If you have smoked or chewed Tobacco  in the last 2 yrs please stop smoking, stop any regular Alcohol   and or any Recreational drug use.  Wear Seat belts while driving.  Please note: You were cared for by a hospitalist during your hospital stay. Once you are discharged, your primary care physician will handle any further medical issues. Please note that NO REFILLS for any discharge medications will be authorized once you are discharged, as it is imperative that you return to your primary care physician (or establish a relationship with a primary care physician if you do not have one) for your post hospital discharge needs so that they can reassess your need for medications and monitor your lab values.   Discharge wound care:   Complete by: As directed    Wound care  Every shift      Comments: Please apply bacitracin ointment to left parietal scalp wound, then cover with Xeroform and apply dry gauze.   Increase activity slowly   Complete by: As directed       Allergies as of  01/17/2024       Reactions   Warfarin And Related Hives, Itching   Amoxicillin  Itching   Augmentin  [amoxicillin -pot Clavulanate] Rash   Avelox [moxifloxacin] Diarrhea        Medication List     PAUSE taking these medications    atorvastatin  40 MG tablet Wait to take this until your doctor or other care provider tells you to start again. Commonly known as: LIPITOR Take 40 mg by mouth daily at 6 PM.   ezetimibe  10 MG tablet Wait to take this until your doctor or other care provider tells you to start again. Commonly known as: ZETIA  Take 10 mg by mouth daily.       TAKE these medications    albuterol  108 (90 Base) MCG/ACT inhaler Commonly known as: VENTOLIN  HFA Inhale 2 puffs into the lungs 2 (two) times daily as needed for wheezing or shortness of breath.   ALPRAZolam  0.5 MG tablet Commonly known as: XANAX  Take 0.5 tablets (0.25 mg total) by mouth at bedtime as needed for anxiety or sleep.   apixaban  5 MG Tabs tablet Commonly known as: ELIQUIS  Take 1 tablet (5 mg total) by mouth 2 (two) times daily.   bacitracin ointment Apply topically daily. Apply to scalp laceration   buPROPion  150 MG 24 hr tablet Commonly known as: WELLBUTRIN  XL Take 150 mg by mouth every morning.   cetirizine 10 MG tablet Commonly known  as: ZYRTEC Take 10 mg by mouth daily.   cyanocobalamin 1000 MCG tablet Take 1 tablet (1,000 mcg total) by mouth daily.   diltiazem  180 MG 24 hr capsule Commonly known as: Dilt-XR Take 1 capsule (180 mg total) by mouth daily.   feeding supplement Liqd Take 237 mLs by mouth 3 (three) times daily between meals.   fluticasone  110 MCG/ACT inhaler Commonly known as: FLOVENT  HFA Inhale 2 puffs into the lungs 2 (two) times daily as needed (for shortness of breath).   fluticasone  50 MCG/ACT nasal spray Commonly known as: FLONASE  Place 2 sprays into both nostrils daily. What changed:  when to take this reasons to take this   metoprolol  tartrate 50 MG  tablet Commonly known as: LOPRESSOR  Take 1 tablet (50 mg total) by mouth 2 (two) times daily.   multivitamin with minerals Tabs tablet Take 1 tablet by mouth daily.   ondansetron  4 MG tablet Commonly known as: ZOFRAN  Take 4 mg by mouth every 6 (six) hours as needed for nausea or vomiting.   oxyCODONE  5 MG immediate release tablet Commonly known as: Oxy IR/ROXICODONE  Take 1 tablet (5 mg total) by mouth every 4 (four) hours as needed for moderate pain (pain score 4-6) or breakthrough pain.   polyethylene glycol 17 g packet Commonly known as: MIRALAX / GLYCOLAX Take 17 g by mouth daily.   Prolia  60 MG/ML Sosy injection Generic drug: denosumab  Inject 60 mg into the skin every 6 (six) months.   psyllium 58.6 % powder Commonly known as: METAMUCIL Take 1 packet by mouth daily.   Qvar RediHaler 40 MCG/ACT inhaler Generic drug: beclomethasone Inhale 2 puffs into the lungs 2 (two) times daily as needed (sob).   senna 8.6 MG Tabs tablet Commonly known as: SENOKOT Take 2 tablets (17.2 mg total) by mouth daily.   Vitamin D-1000 Max St 25 MCG (1000 UT) tablet Generic drug: Cholecalciferol Take 1,000 Units by mouth daily.   Zinc 50 MG Tabs 1 tablet Orally Once a day               Durable Medical Equipment  (From admission, onward)           Start     Ordered   01/13/24 1125  For home use only DME lightweight manual wheelchair with seat cushion  Once       Comments: Patient suffers from  ankle fracture which impairs their ability to perform daily activities like toileting in the home.  A walker will not resolve  issue with performing activities of daily living. A wheelchair will allow patient to safely perform daily activities. Patient is not able to propel themselves in the home using a standard weight wheelchair due to general weakness. Patient can self propel in the lightweight wheelchair. Length of need 12 months . Accessories: elevating leg rests (ELRs), wheel locks,  extensions and anti-tippers.   01/13/24 1125              Discharge Care Instructions  (From admission, onward)           Start     Ordered   01/17/24 0000  Discharge wound care:       Comments: Wound care  Every shift      Comments: Please apply bacitracin ointment to left parietal scalp wound, then cover with Xeroform and apply dry gauze.   01/17/24 9096            Contact information for follow-up providers  Sharl Selinda Dover, MD Follow up.   Specialty: Orthopedic Surgery Why: See in one week Contact information: [address] [place] [address] [phone]         Loreli Monica JONETTA Mickey., MD. Call .   Specialty: Internal Medicine Why: Follow up from ER visit Contact information: [address] [place] [address] [phone]         [facility] at [facility]. Go to .   Specialty: Emergency Medicine Why: As needed, If symptoms worsen Contact information: [address] [place]  [address] [phone]        Kit Rush, MD. Schedule an appointment as soon as possible for a visit in 1 week(s).   Specialty: Orthopedic Surgery Contact information: [address] [place] [address] [place] [address] [phone]              Contact information for after-discharge care     Destination     [facility] .   Service: Skilled Nursing Contact information: [address] [place]  [address] [phone]                    Allergies  Allergen Reactions   Warfarin And Related Hives and Itching   Amoxicillin  Itching   Augmentin  [Amoxicillin -Pot Clavulanate] Rash   Avelox [Moxifloxacin] Diarrhea     Other Procedures/Studies: CT Foot Left Wo Contrast Result Date: 01/13/2024 CLINICAL DATA:  Blunt trauma. Patient fell through attic floor into the garage. EXAM: CT OF THE LEFT FOOT WITHOUT CONTRAST TECHNIQUE: Multidetector CT imaging  of the left foot was performed according to the standard protocol. Multiplanar CT image reconstructions were also generated. RADIATION DOSE REDUCTION: This exam was performed according to the departmental dose-optimization program which includes automated exposure control, adjustment of the mA and/or kV according to patient size and/or use of iterative reconstruction technique. COMPARISON:  Same day radiographs of the left ankle dated 01/13/2024. FINDINGS: Bones/Joint/Cartilage Extensively comminuted fracture of the calcaneus. There is approximately 5 mm of lateral displacement posterolaterally (series 1, image 145). Fracture margins extend through the posterior and plantar aspects of the calcaneal tuberosity, the posterior subtalar joint, the sustentaculum tali, and the anterior calcaneal process. Anterior calcaneal process fracture fragment extends into the region of the sinus tarsi. Minimally distracted fracture of the superior margin of the posterior talar process (series 6, image 29). Nondisplaced fracture of the medial malleolus (series 1, image 146). No significant clear space widening of the ankle mortise. Ligaments Ligaments are suboptimally evaluated by CT. Muscles and Tendons No intramuscular fluid collection or hematoma. Soft tissue Soft tissue swelling of the ankle and hindfoot, most pronounced posteriorly. No loculated fluid collection. No radiopaque foreign body IMPRESSION: 1. Extensively comminuted, intra-articular and displaced fracture of the left calcaneus, as above. 2. Nondisplaced fracture of the medial malleolus. 3. Minimally distracted fracture of the posterior talar process. Electronically Signed   By: Harrietta Sherry M.D.   On: 01/13/2024 13:12   CT Foot Right Wo Contrast Result Date: 01/13/2024 CLINICAL DATA:  Blunt trauma. Patient fell throughout attic floor into garage. EXAM: CT OF THE RIGHT FOOT WITHOUT CONTRAST TECHNIQUE: Multidetector CT imaging of the right foot was performed  according to the standard protocol. Multiplanar CT image reconstructions were also generated. RADIATION DOSE REDUCTION: This exam was performed according to the departmental dose-optimization program which includes automated exposure control, adjustment of the mA and/or kV according to patient size and/or use of iterative reconstruction technique. COMPARISON:  Same day radiographs of the right ankle dated 01/13/2024. FINDINGS: Bones/Joint/Cartilage Extensively comminuted fracture of the calcaneus. There is approximately 8 mm of lateral displacement posterolaterally (series 3, image 129). Fracture margins extend through the posterior and plantar aspects of the calcaneal tuberosity, the posterior subtalar joint, the sustentaculum tali, the anterior calcaneal process, as well as the articular surface at the level of the calcaneocuboid joint. Anterior calcaneal process fracture fragments extend into the level of the sinus tarsi. Tiny linear osseous fragment adjacent to the medial plantar base of the cuboid is compatible with a minimally distracted avulsion fracture (series 7, image 56). Nondisplaced fracture at the proximal dorsal margin of the medial cuneiform (series 8, image 36 and series 3, image 87). No evidence of diastasis or malalignment at the level of the Lisfranc interval. Tibiotalar joint is anatomically aligned. Ankle mortise appears congruent. Ligaments Ligaments are suboptimally evaluated by CT. Muscles and Tendons No intramuscular fluid collection or hematoma. Soft tissue Soft tissue swelling of the ankle and hindfoot, most pronounced posterolaterally. No loculated fluid collection. No radiopaque foreign body. IMPRESSION: 1. Extensively comminuted, intra-articular and displaced fracture of the right calcaneus, as above. 2. Nondisplaced fracture at the proximal dorsal margin of the medial cuneiform. 3. Tiny linear osseous fragment adjacent to the medial plantar base of the cuboid is compatible with a  minimally distracted avulsion fracture. Electronically Signed   By: Harrietta Sherry M.D.   On: 01/13/2024 13:00   CT CHEST ABDOMEN PELVIS W CONTRAST Result Date: 01/13/2024 CLINICAL DATA:  Poly trauma, blunt. EXAM: CT CHEST, ABDOMEN, AND PELVIS WITH CONTRAST TECHNIQUE: Multidetector CT imaging of the chest, abdomen and pelvis was performed following the standard protocol during bolus administration of intravenous contrast. RADIATION DOSE REDUCTION: This exam was performed according to the departmental dose-optimization program which includes automated exposure control, adjustment of the mA and/or kV according to patient size and/or use of iterative reconstruction technique. CONTRAST:  75mL OMNIPAQUE IOHEXOL 350 MG/ML SOLN COMPARISON:  CT chest 03/08/2013 FINDINGS: CT CHEST FINDINGS Cardiovascular: Atherosclerotic calcifications in thoracic aorta without aneurysm. No evidence for an aortic dissection. Heart size is normal. No significant pericardial effusion. Mediastinum/Nodes: Negative for a mediastinal hematoma. No lymph node enlargement in the mediastinum or hila. No axillary lymph node enlargement. Negative for pneumomediastinum. Esophagus is unremarkable. Lungs/Pleura: Negative for pneumothorax. Mild scarring at the lung apices. Mild bronchiectasis in the medial right upper lobe on image 77/4 and this is chronic. Small nodular densities near this bronchiectasis appears new and largest nodule measures 5 mm on image 77/4. Additional bronchiectasis and small nodular densities in the right middle lobe. These nodular densities are suggestive for post inflammatory changes based on the morphology. No pleural effusions. Peripheral nodular and tubular densities in the posterior left lower lobe, best seen on image 89/4. Elongated tubular nodular area on image 89/4 measures up to 9 mm. Multiple small nodular densities scattered throughout the left lung. Musculoskeletal: No acute bone abnormality. CT ABDOMEN PELVIS  FINDINGS Hepatobiliary: Cholecystectomy. Mild intrahepatic biliary dilatation. Main portal venous system is patent. No evidence for acute injury or laceration. Pancreas: 3 mm hypodensity near the pancreatic tail appears chronic and likely an incidental finding. No evidence for pancreatic duct dilatation or inflammation. Spleen: Normal in size without focal abnormality. Adrenals/Urinary Tract: Mild fullness of the adrenal glands appear chronic. There is probably a small right adrenal adenoma based on the prior chest CT. 3 mm calcification in left kidney lower pole. Negative for hydronephrosis. 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 No suspicious renal lesion. Evidence for renal cysts that do not require dedicated follow-up. No hydronephrosis. Normal urinary bladder. Stomach/Bowel: Moderate distention of the rectum with stool. No acute bowel inflammation. No evidence for bowel dilatation or obstruction. Vascular/Lymphatic: Aortic atherosclerosis. No enlarged abdominal or pelvic lymph nodes. Reproductive: Uterus is absent. Low-density material or fluid in the region of the right adnexa on image 100/30 is nonspecific but likely within normal limits. No suspicious adnexal mass. Other: No evidence for free fluid.  Negative for free air. Musculoskeletal: Soft tissue edema and probable hematoma along the lateral left hip at the level of the left greater trochanter. This soft tissue abnormalities are involving the subcutaneous tissues and the lateral aspect of the gluteal musculature. Small hypervascular areas in this area and cannot exclude small areas of bleeding. Both hips are located. Multilevel degenerative disc space narrowing and endplate disease in lumbar spine. No acute bone abnormality. IMPRESSION: 1. Soft tissue edema and hematoma along the lateral left hip at the level of the left greater trochanter. This hematoma is involving the subcutaneous tissue and lateral gluteal musculature. Concern for small  foci of contrast extravasation and bleeding in this area. 2. No other acute abnormality in the chest, abdomen or pelvis. 3. Chronic lung changes with multiple small nodular densities scattered throughout the lungs. Morphology is suggestive for post infectious or inflammatory changes. Largest nodular area measures up to 9 mm. Recommend follow-up chest CT in 3 months to ensure resolution. 4. Nonobstructing left renal calculus. 5. Aortic Atherosclerosis (ICD10-I70.0). These results were called by telephone at the time of interpretation on 01/13/2024 at 12:53 pm to provider JAYSON PEREYRA , who verbally acknowledged these results. Electronically Signed   By: Juliene Balder M.D.   On: 01/13/2024 12:57   CT Thoracic Spine Wo Contrast Result Date: 01/13/2024 CLINICAL DATA:  Back pain.  Trauma.  Status post fall. EXAM: CT THORACIC SPINE WITHOUT CONTRAST TECHNIQUE: Multidetector CT images of the thoracic were obtained using the standard protocol without intravenous contrast. RADIATION DOSE REDUCTION: This exam was performed according to the departmental dose-optimization program which includes automated exposure control, adjustment of the mA and/or kV according to patient size and/or use of iterative reconstruction technique. COMPARISON:  CT chest from 03/08/2013 FINDINGS: Alignment: There is upper thoracic kyphosis deformity. Vertebrae: No signs acute fracture or dislocation. Mild anterior wedging the T8 vertebra identified which appears unchanged from 03/08/2013. Paraspinal and other soft tissues: No signs hematoma within the thoracic canal. No paraspinal fluid collections identified. Trace bilateral pleural effusions. Diffuse bronchial wall thickening identified. Interlobular septal thickening identified compatible with mild edema. Aortic atherosclerosis. Nodular thickening of bilateral adrenal glands likely reflect underlying adenomas. Disc levels: Multilevel disc space narrowing with endplate spurring. Calcified posterior  disc bulge/herniation noted at T8-9 without signs of spinal stenosis. IMPRESSION: 1. No signs of acute fracture or dislocation. 2. Upper thoracic kyphosis deformity. 3. Multilevel degenerative disc disease. 4. Trace bilateral pleural effusions. 5. Diffuse bronchial wall thickening and interlobular septal thickening compatible with mild edema. 6.  Aortic Atherosclerosis (ICD10-I70.0). Electronically Signed   By: Waddell Calk M.D.   On: 01/13/2024 10:17   CT Lumbar Spine Wo Contrast Result Date: 01/13/2024 CLINICAL DATA:  Low back pain after fall.  Trauma. EXAM: CT LUMBAR SPINE WITHOUT CONTRAST TECHNIQUE: Multidetector CT imaging of the lumbar spine was performed without intravenous contrast administration. Multiplanar CT image reconstructions were also generated. RADIATION DOSE REDUCTION: This exam was performed according to the departmental dose-optimization program which includes automated exposure  control, adjustment of the mA and/or kV according to patient size and/or use of iterative reconstruction technique. COMPARISON:  Lumbar spine MRI from 11/25/21. FINDINGS: Segmentation: 5 non rib-bearing lumbar vertebra identified. Alignment: Similar grade 1 retrolisthesis of L5 on S1. Vertebrae: The lumbar vertebral body heights are well preserved. No signs of acute fracture. The facet joints are aligned. No signs of subluxation. Paraspinal and other soft tissues: No paraspinal fluid collections. No signs of intra canal hematoma. Stone is noted within the lower pole of left kidney measuring 4 mm. Multiple left kidney cysts are identified. The largest is off the upper pole measuring 2.8 cm. Aortic atherosclerosis. Cholecystectomy. Sigmoid diverticulosis without signs of acute diverticulitis. Disc levels: Multilevel disc space narrowing and endplate spurring is noted at L2-3, L3-4, L4-5 and L5-S1. IMPRESSION: 1. No evidence for acute fracture or subluxation of the lumbar spine. 2. Multilevel degenerative disc disease.  3. Left nephrolithiasis. 4. Sigmoid diverticulosis without signs of acute diverticulitis. 5.  Aortic Atherosclerosis (ICD10-I70.0). Electronically Signed   By: Waddell Calk M.D.   On: 01/13/2024 10:09   CT HEAD WO CONTRAST Result Date: 01/13/2024 CLINICAL DATA:  Provided history: Head trauma, moderate/severe. Poly trauma, blunt. Fall. EXAM: CT HEAD WITHOUT CONTRAST CT CERVICAL SPINE WITHOUT CONTRAST TECHNIQUE: Multidetector CT imaging of the head and cervical spine was performed following the standard protocol without intravenous contrast. Multiplanar CT image reconstructions of the cervical spine were also generated. RADIATION DOSE REDUCTION: This exam was performed according to the departmental dose-optimization program which includes automated exposure control, adjustment of the mA and/or kV according to patient size and/or use of iterative reconstruction technique. COMPARISON:  CT angiogram neck 03/03/2017. FINDINGS: CT HEAD FINDINGS Brain: Mild generalized cerebral atrophy. Patchy and ill-defined hypoattenuation within the cerebral white matter, nonspecific but compatible with mild-to-moderate chronic small vessel ischemic disease. Chronic right thalamic lacunar infarct. There is no acute intracranial hemorrhage. No demarcated cortical infarct. No extra-axial fluid collection. No evidence of an intracranial mass. No midline shift. Vascular: No hyperdense vessel.  Atherosclerotic calcifications. Skull: No calvarial fracture or aggressive osseous lesion. Sinuses/Orbits: No mass or acute finding within the imaged orbits. Moderate mucosal thickening, and small fluid level, within the right maxillary sinus. Other: Left parietal scalp hematoma and laceration. CT CERVICAL SPINE FINDINGS Alignment: 2 mm grade 1 anterolisthesis at C4-C5, C5-C6, C6-C7, C7-T1, T1-T2 and T2-T3. Skull base and vertebrae: The basion-dental and atlanto-dental intervals are maintained.No evidence of acute fracture to the cervical spine.  Redemonstrated chronic fracture deformity through the anterior superior corner of the C5 vertebral body (with small fracture fragment). This was present on the prior CTA neck of 03/03/2017. Soft tissues and spinal canal: No prevertebral fluid or swelling. No visible canal hematoma. Disc levels: Cervical spondylosis with multilevel disc space narrowing, disc bulges/central disc protrusions, uncovertebral hypertrophy and facet arthropathy. Disc space narrowing is greatest at C5-C6 (advanced at this level) and there is a posterior disc osteophyte complex at this level. No appreciable high-grade spinal canal stenosis. Multilevel bony neural foraminal narrowing. Degenerative changes also present at the C1-C2 articulation. Upper chest: No consolidation within the imaged lung apices. No visible pneumothorax. IMPRESSION: CT head: 1.  No evidence of an acute intracranial abnormality. 2. Left parietal scalp hematoma and laceration. 3. 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 4. Right maxillary sinus disease as described CT cervical spine: 1. No evidence of an acute cervical spine fracture. 2. Redemonstrated chronic fracture through anterosuperior corner of the C5 vertebral  body (with small fracture fragment). 3. Grade 1 anterolisthesis at C4-C5, C5-C6, C6-C7, C7-T1, T1-T2 and T2-T3. 4. Cervical spondylosis as described. Electronically Signed   By: Rockey Childs D.O.   On: 01/13/2024 10:08   CT CERVICAL SPINE WO CONTRAST Result Date: 01/13/2024 CLINICAL DATA:  Provided history: Head trauma, moderate/severe. Poly trauma, blunt. Fall. EXAM: CT HEAD WITHOUT CONTRAST CT CERVICAL SPINE WITHOUT CONTRAST TECHNIQUE: Multidetector CT imaging of the head and cervical spine was performed following the standard protocol without intravenous contrast. Multiplanar CT image reconstructions of the cervical spine were also generated. RADIATION DOSE REDUCTION: This exam was  performed according to the departmental dose-optimization program which includes automated exposure control, adjustment of the mA and/or kV according to patient size and/or use of iterative reconstruction technique. COMPARISON:  CT angiogram neck 03/03/2017. FINDINGS: CT HEAD FINDINGS Brain: Mild generalized cerebral atrophy. Patchy and ill-defined hypoattenuation within the cerebral white matter, nonspecific but compatible with mild-to-moderate chronic small vessel ischemic disease. Chronic right thalamic lacunar infarct. There is no acute intracranial hemorrhage. No demarcated cortical infarct. No extra-axial fluid collection. No evidence of an intracranial mass. No midline shift. Vascular: No hyperdense vessel.  Atherosclerotic calcifications. Skull: No calvarial fracture or aggressive osseous lesion. Sinuses/Orbits: No mass or acute finding within the imaged orbits. Moderate mucosal thickening, and small fluid level, within the right maxillary sinus. Other: Left parietal scalp hematoma and laceration. CT CERVICAL SPINE FINDINGS Alignment: 2 mm grade 1 anterolisthesis at C4-C5, C5-C6, C6-C7, C7-T1, T1-T2 and T2-T3. Skull base and vertebrae: The basion-dental and atlanto-dental intervals are maintained.No evidence of acute fracture to the cervical spine. Redemonstrated chronic fracture deformity through the anterior superior corner of the C5 vertebral body (with small fracture fragment). This was present on the prior CTA neck of 03/03/2017. Soft tissues and spinal canal: No prevertebral fluid or swelling. No visible canal hematoma. Disc levels: Cervical spondylosis with multilevel disc space narrowing, disc bulges/central disc protrusions, uncovertebral hypertrophy and facet arthropathy. Disc space narrowing is greatest at C5-C6 (advanced at this level) and there is a posterior disc osteophyte complex at this level. No appreciable high-grade spinal canal stenosis. Multilevel bony neural foraminal narrowing.  Degenerative changes also present at the C1-C2 articulation. Upper chest: No consolidation within the imaged lung apices. No visible pneumothorax. IMPRESSION: CT head: 1.  No evidence of an acute intracranial abnormality. 2. Left parietal scalp hematoma and laceration. 3. 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 4. Right maxillary sinus disease as described CT cervical spine: 1. No evidence of an acute cervical spine fracture. 2. Redemonstrated chronic fracture through anterosuperior corner of the C5 vertebral body (with small fracture fragment). 3. Grade 1 anterolisthesis at C4-C5, C5-C6, C6-C7, C7-T1, T1-T2 and T2-T3. 4. Cervical spondylosis as described. Electronically Signed   By: Rockey Childs D.O.   On: 01/13/2024 10:08   DG Ankle Left Port Result Date: 01/13/2024 CLINICAL DATA:  Blunt trauma.  Fell through [employer] floor EXAM: PORTABLE LEFT ANKLE - 2 VIEW COMPARISON:  None Available. FINDINGS: There is a nondisplaced fracture through the medial malleolus. There is also comminuted fracture deformity involving the calcaneus with multiple fracture lines identified posteriorly as well as within the anterior calcaneus. Fracture fragments appear to be in near anatomic alignment. IMPRESSION: 1. Nondisplaced fracture through the medial malleolus. 2. Comminuted fracture deformity of the calcaneus. Electronically Signed   By: Waddell Calk M.D.   On: 01/13/2024 09:22   DG Ankle Right Port Result Date: 01/13/2024 .  CLINICAL DATA: Blunt trauma.  Patient fell through attic floor into garage. EXAM: PORTABLE RIGHT ANKLE - 2 VIEW COMPARISON:  None Available. FINDINGS: There is a lucency through the posterior calcaneus concerning for nondisplaced fracture. Extent of fracture is suboptimally evaluated on this radiograph. There may also be fractures involving the anterior calcaneus though the fracture lines appear indistinct IMPRESSION: 1. Nondisplaced  calcaneal fracture is identified. Consider further evaluation with CT of the ankle. Electronically Signed   By: Waddell Calk M.D.   On: 01/13/2024 09:20   DG Pelvis Portable Result Date: 01/13/2024 CLINICAL DATA:  Clemens through attic into garage. EXAM: PORTABLE PELVIS 1-2 VIEWS COMPARISON:  None Available. FINDINGS: There are no signs of acute fracture or dislocation. No evidence for pelvic diastasis. Degenerative disc disease noted in the lumbar spine. IMPRESSION: 1. No acute findings. 2. Lumbar degenerative disc disease. Electronically Signed   By: Waddell Calk M.D.   On: 01/13/2024 09:16   DG Elbow 2 Views Right Result Date: 01/13/2024 CLINICAL DATA:  Trauma.  Fell through [employer]. EXAM: RIGHT ELBOW - 2 VIEW COMPARISON:  None Available. FINDINGS: There is no joint effusion. No signs of acute fracture or subluxation. No significant arthropathy. IMPRESSION: Negative. Electronically Signed   By: Waddell Calk M.D.   On: 01/13/2024 09:15   DG Elbow 2 Views Left Result Date: 01/13/2024 CLINICAL DATA:  Clemens through attic into arise.  Elbow pain. EXAM: LEFT ELBOW - 2 VIEW COMPARISON:  None Available. FINDINGS: No signs of joint effusion. No acute fracture or dislocation. No significant arthropathy. IMPRESSION: Negative. Electronically Signed   By: Waddell Calk M.D.   On: 01/13/2024 09:14   DG Chest Port 1 View Result Date: 01/13/2024 CLINICAL DATA:  Trauma.  Fell through floor of attic into arrive. EXAM: PORTABLE CHEST 1 VIEW COMPARISON:  10/11/2023 FINDINGS: Heart size and mediastinal contours are normal. Aortic atherosclerotic calcification. Pulmonary vascular congestion without frank edema. No pleural effusion or pneumothorax identified. No focal airspace consolidation. No fracture identified withinvisualized osseous structures. IMPRESSION: Pulmonary vascular congestion without frank edema. Electronically Signed   By: Waddell Calk M.D.   On: 01/13/2024 09:13   MM 3D SCREENING MAMMOGRAM  BILATERAL BREAST Result Date: 01/10/2024 CLINICAL DATA:  Screening. EXAM: DIGITAL SCREENING BILATERAL MAMMOGRAM WITH TOMOSYNTHESIS AND CAD TECHNIQUE: Bilateral screening digital craniocaudal and mediolateral oblique mammograms were obtained. Bilateral screening digital breast tomosynthesis was performed. The images were evaluated with computer-aided detection. COMPARISON:  Previous exam(s). ACR Breast Density Category c: The breasts are heterogeneously dense, which may obscure small masses. FINDINGS: There are no findings suspicious for malignancy. IMPRESSION: No mammographic evidence of malignancy. A result letter of this screening mammogram will be mailed directly to the patient. RECOMMENDATION: Screening mammogram in one year. (Code:SM-B-01Y) BI-RADS CATEGORY  1: Negative. Electronically Signed   By: Norleen Croak M.D.   On: 01/10/2024 08:04     TODAY-DAY OF DISCHARGE:  Subjective:   Monica Williamson today has no headache,no chest abdominal pain,no new weakness tingling or numbness, feels much better wants to go home today.   Objective:   Blood pressure 126/70, pulse 96, temperature 98.5 F (36.9 C), temperature source Oral, resp. rate 18, height 5' 6 (1.676 m), weight 64.9 kg, SpO2 94%.  Intake/Output Summary (Last 24 hours) at 01/17/2024 0904 Last data filed at 01/17/2024 0809 Gross per 24 hour  Intake 332.66 ml  Output 1300 ml  Net -967.34 ml   Filed Weights   01/13/24 0823  Weight: 64.9 kg  Exam: Awake Alert, Oriented *3, No new F.N deficits, Normal affect [place].AT,PERRAL Supple Neck,No JVD, No cervical lymphadenopathy appriciated.  Symmetrical Chest wall movement, Good air movement bilaterally, CTAB RRR,No Gallops,Rubs or new Murmurs, No Parasternal Heave +ve B.Sounds, Abd Soft, Non tender, No organomegaly appriciated, No rebound -guarding or rigidity. No Cyanosis, Clubbing or edema, No new Rash or bruise   PERTINENT RADIOLOGIC STUDIES: No results found.   PERTINENT LAB  RESULTS: CBC: Recent Labs    01/16/24 0537 01/17/24 0543  WBC 8.1 8.1  HGB 9.5* 8.8*  HCT 29.3* 26.8*  PLT 147* 177   CMET CMP     Component Value Date/Time   NA 135 01/17/2024 0543   NA 139 09/14/2021 1021   K 4.1 01/17/2024 0543   CL 99 01/17/2024 0543   CO2 26 01/17/2024 0543   GLUCOSE 113 (H) 01/17/2024 0543   BUN 13 01/17/2024 0543   BUN 29 (H) 09/14/2021 1021   CREATININE 0.70 01/17/2024 0543   CALCIUM  8.5 (L) 01/17/2024 0543   PROT 5.0 (L) 01/17/2024 0543   ALBUMIN 2.4 (L) 01/17/2024 0543   AST 65 (H) 01/17/2024 0543   ALT 110 (H) 01/17/2024 0543   ALKPHOS 107 01/17/2024 0543   BILITOT 0.8 01/17/2024 0543   EGFR 61 09/14/2021 1021   GFRNONAA >60 01/17/2024 0543    GFR Estimated Creatinine Clearance: 48.1 mL/min (by C-G formula based on SCr of 0.7 mg/dL). No results for input(s): LIPASE, AMYLASE in the last 72 hours. No results for input(s): CKTOTAL, CKMB, CKMBINDEX, TROPONINI in the last 72 hours. Invalid input(s): POCBNP No results for input(s): DDIMER in the last 72 hours. No results for input(s): HGBA1C in the last 72 hours. No results for input(s): CHOL, HDL, LDLCALC, TRIG, CHOLHDL, LDLDIRECT in the last 72 hours. No results for input(s): TSH, T4TOTAL, T3FREE, THYROIDAB in the last 72 hours.  Invalid input(s): FREET3 Recent Labs    01/16/24 0536 01/16/24 0605  VITAMINB12  --  290  FOLATE 10.4  --    Coags: No results for input(s): INR in the last 72 hours.  Invalid input(s): PT Microbiology: No results found for this or any previous visit (from the past 240 hours).  FURTHER DISCHARGE INSTRUCTIONS:  Get Medicines reviewed and adjusted: Please take all your medications with you for your next visit with your Primary MD  Laboratory/radiological data: Please request your Primary MD to go over all hospital tests and procedure/radiological results at the follow up, please ask your Primary MD to get all  Hospital records sent to his/her office.  In some cases, they will be blood work, cultures and biopsy results pending at the time of your discharge. Please request that your primary care M.D. goes through all the records of your hospital data and follows up on these results.  Also Note the following: If you experience worsening of your admission symptoms, develop shortness of breath, life threatening emergency, suicidal or homicidal thoughts you must seek medical attention immediately by calling 911 or calling your MD immediately  if symptoms less severe.  You must read complete instructions/literature along with all the possible adverse reactions/side effects for all the Medicines you take and that have been prescribed to you. Take any new Medicines after you have completely understood and accpet all the possible adverse reactions/side effects.   Do not drive when taking Pain medications or sleeping medications (Benzodaizepines)  Do not take more than prescribed Pain, Sleep and Anxiety Medications. It is not advisable to combine anxiety,sleep and  pain medications without talking with your primary care practitioner  Special Instructions: If you have smoked or chewed Tobacco  in the last 2 yrs please stop smoking, stop any regular Alcohol   and or any Recreational drug use.  Wear Seat belts while driving.  Please note: You were cared for by a hospitalist during your hospital stay. Once you are discharged, your primary care physician will handle any further medical issues. Please note that NO REFILLS for any discharge medications will be authorized once you are discharged, as it is imperative that you return to your primary care physician (or establish a relationship with a primary care physician if you do not have one) for your post hospital discharge needs so that they can reassess your need for medications and monitor your lab values.  Total Time spent coordinating discharge including counseling,  education and face to face time equals greater than 30 minutes.  SignedBETHA Donalda Applebaum 01/17/2024 9:04 AM

## 2024-01-18 DIAGNOSIS — R7401 Elevation of levels of liver transaminase levels: Secondary | ICD-10-CM | POA: Diagnosis not present

## 2024-01-18 DIAGNOSIS — R918 Other nonspecific abnormal finding of lung field: Secondary | ICD-10-CM | POA: Diagnosis not present

## 2024-01-18 DIAGNOSIS — I480 Paroxysmal atrial fibrillation: Secondary | ICD-10-CM | POA: Diagnosis not present

## 2024-02-19 ENCOUNTER — Telehealth: Payer: Self-pay | Admitting: Cardiology

## 2024-02-19 MED ORDER — METOPROLOL TARTRATE 50 MG PO TABS: 50.0000 mg | ORAL_TABLET | Freq: Two times a day (BID) | ORAL | 1 refills | Status: DC

## 2024-02-19 NOTE — Telephone Encounter (Signed)
*  STAT* If patient is at the pharmacy, call can be transferred to refill team.   1. Which medications need to be refilled? (please list name of each medication and dose if known) metoprolol  tartrate (LOPRESSOR ) 50 MG tablet   2. Which pharmacy/location (including street and city if local pharmacy) is medication to be sent to?  [id number] - [place], [place] - [address]    3. Do they need a 30 day or 90 day supply? 90

## 2024-05-06 ENCOUNTER — Telehealth: Payer: Self-pay | Admitting: Cardiology

## 2024-05-06 DIAGNOSIS — I4819 Other persistent atrial fibrillation: Secondary | ICD-10-CM

## 2024-05-06 MED ORDER — APIXABAN 5 MG PO TABS: 5.0000 mg | ORAL_TABLET | Freq: Two times a day (BID) | ORAL | 1 refills | Status: AC

## 2024-05-06 NOTE — Telephone Encounter (Signed)
 Prescription refill request for Eliquis  received. Indication: AF Last office visit: 06/26/23  P Nahser MD Scr: 0.70 on 01/17/24  Epic Age: 83 Weight: 68.5kg  Based on above findings Eliquis  5mg  twice daily is the appropriate dose.  Refill approved.

## 2024-05-06 NOTE — Telephone Encounter (Signed)
*  STAT* If patient is at the pharmacy, call can be transferred to refill team.   1. Which medications need to be refilled? (please list name of each medication and dose if known) apixaban  (ELIQUIS ) 5 MG TABS tablet    2. Would you like to learn more about the convenience, safety, & potential cost savings by using the [facility]? No   3. Are you open to using the [facility] (Type [facility] No   4. Which pharmacy/location (including street and city if local pharmacy) is medication to be sent to?  [id number] - [place], [place] - [address]   5. Do they need a 30 day or 90 day supply? 90 day  Pt c/o medication issue:  1. Name of Medication: Pantoprazole  40 mg  2. How are you currently taking this medication (dosage and times per day)? N/A  3. Are you having a reaction (difficulty breathing--STAT)? No  4. What is your medication issue? Pt states that she was not aware that medication had been d/c'd. She has still been taking it and would like a c/b regarding this matter. Please advise

## 2024-05-17 ENCOUNTER — Other Ambulatory Visit (HOSPITAL_COMMUNITY): Payer: Self-pay | Admitting: Internal Medicine

## 2024-05-17 DIAGNOSIS — M8580 Other specified disorders of bone density and structure, unspecified site: Secondary | ICD-10-CM | POA: Insufficient documentation

## 2024-05-17 DIAGNOSIS — E559 Vitamin D deficiency, unspecified: Secondary | ICD-10-CM | POA: Insufficient documentation

## 2024-05-20 ENCOUNTER — Telehealth (HOSPITAL_COMMUNITY): Payer: Self-pay | Admitting: Pharmacy Technician

## 2024-05-20 NOTE — Telephone Encounter (Signed)
 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

## 2024-06-24 ENCOUNTER — Inpatient Hospital Stay (HOSPITAL_COMMUNITY)
Admission: RE | Admit: 2024-06-24 | Discharge: 2024-06-24 | Disposition: A | Source: Ambulatory Visit | Attending: Internal Medicine

## 2024-06-24 VITALS — BP 149/73 | HR 70 | Temp 97.6°F | Resp 16

## 2024-06-24 DIAGNOSIS — M8580 Other specified disorders of bone density and structure, unspecified site: Secondary | ICD-10-CM

## 2024-06-24 DIAGNOSIS — E559 Vitamin D deficiency, unspecified: Secondary | ICD-10-CM

## 2024-06-24 DIAGNOSIS — M810 Age-related osteoporosis without current pathological fracture: Secondary | ICD-10-CM

## 2024-06-24 MED ORDER — DENOSUMAB-BBDZ 60 MG/ML ~~LOC~~ SOSY
60.0000 mg | PREFILLED_SYRINGE | Freq: Once | SUBCUTANEOUS | Status: AC
  Administered 2024-06-24: 60 mg via SUBCUTANEOUS

## 2024-06-24 MED ORDER — DENOSUMAB-BBDZ 60 MG/ML ~~LOC~~ SOSY
PREFILLED_SYRINGE | SUBCUTANEOUS | Status: AC
  Filled 2024-06-24: qty 1

## 2024-07-20 ENCOUNTER — Other Ambulatory Visit: Payer: Self-pay

## 2024-07-20 ENCOUNTER — Emergency Department (HOSPITAL_BASED_OUTPATIENT_CLINIC_OR_DEPARTMENT_OTHER)
Admission: EM | Admit: 2024-07-20 | Discharge: 2024-07-20 | Disposition: A | Discharge status: Home / Self Care | Attending: Emergency Medicine | Admitting: Emergency Medicine

## 2024-07-20 ENCOUNTER — Encounter (HOSPITAL_BASED_OUTPATIENT_CLINIC_OR_DEPARTMENT_OTHER): Payer: Self-pay | Admitting: Emergency Medicine

## 2024-07-20 ENCOUNTER — Emergency Department (HOSPITAL_BASED_OUTPATIENT_CLINIC_OR_DEPARTMENT_OTHER)

## 2024-07-20 DIAGNOSIS — I4891 Unspecified atrial fibrillation: Secondary | ICD-10-CM | POA: Insufficient documentation

## 2024-07-20 DIAGNOSIS — R519 Headache, unspecified: Secondary | ICD-10-CM | POA: Diagnosis not present

## 2024-07-20 DIAGNOSIS — R509 Fever, unspecified: Secondary | ICD-10-CM | POA: Diagnosis present

## 2024-07-20 DIAGNOSIS — Z7901 Long term (current) use of anticoagulants: Secondary | ICD-10-CM | POA: Insufficient documentation

## 2024-07-20 DIAGNOSIS — R6889 Other general symptoms and signs: Secondary | ICD-10-CM

## 2024-07-20 LAB — RESP PANEL BY RT-PCR (RSV, FLU A&B, COVID)  RVPGX2
Influenza A by PCR: NEGATIVE
Influenza B by PCR: NEGATIVE
Resp Syncytial Virus by PCR: NEGATIVE
SARS Coronavirus 2 by RT PCR: NEGATIVE

## 2024-07-20 MED ORDER — ACETAMINOPHEN 500 MG PO TABS
1000.0000 mg | ORAL_TABLET | Freq: Once | ORAL | Status: AC
  Administered 2024-07-20: 1000 mg via ORAL
  Filled 2024-07-20: qty 2

## 2024-07-20 MED ORDER — OSELTAMIVIR PHOSPHATE 75 MG PO CAPS
75.0000 mg | ORAL_CAPSULE | Freq: Once | ORAL | Status: DC
  Filled 2024-07-20: qty 1

## 2024-07-20 NOTE — ED Triage Notes (Signed)
 Patient reports she has been feeling crummy for 2 days, and was running a fever of 101 at home.  Patient reports she called her daughter and her daughter told her she needed to come in for a flu test and she did not need to wait until the morning.

## 2024-07-20 NOTE — Discharge Instructions (Signed)
 Overall I suspect you do have some sort of viral illness.  Your COVID and flu test were negative today.  Continue Tylenol  and ibuprofen at home for support.  Continue inhaler as needed.  Return if symptoms worsen or fevers persisting more than 5 days.  At this time there is no evidence of lung infection otherwise.

## 2024-07-20 NOTE — ED Provider Notes (Signed)
 " [place] EMERGENCY DEPARTMENT AT [facility] Provider Note   CSN: [id number] Arrival date & time: 07/20/24  2131     Patient presents with: Influenza   Monica Williamson is a 84 y.o. female.   Flulike symptoms for less than 48 hours.  No major cough body aches or chills mild headache fever.  Has not taken any Tylenol  ibuprofen.  History of A-fib on anticoagulation.  History of bronchiectasis.  Denies any nausea vomiting diarrhea.  She overall feels well but she wanted to take Tamiflu  if she did have flu.  The history is provided by the patient.       Prior to Admission medications  Medication Sig Start Date End Date Taking? Authorizing Provider  albuterol  (PROVENTIL  HFA;VENTOLIN  HFA) 108 (90 Base) MCG/ACT inhaler Inhale 2 puffs into the lungs 2 (two) times daily as needed for wheezing or shortness of breath. 03/09/16   Parrett, Madelin RAMAN, NP  ALPRAZolam  (XANAX ) 0.5 MG tablet Take 0.5 tablets (0.25 mg total) by mouth at bedtime as needed for anxiety or sleep. 01/17/24   Ghimire, Donalda HERO, MD  apixaban  (ELIQUIS ) 5 MG TABS tablet Take 1 tablet (5 mg total) by mouth 2 (two) times daily. 05/06/24   Kate Lonni CROME, MD  [Paused] atorvastatin  (LIPITOR) 40 MG tablet Take 40 mg by mouth daily at 6 PM.  Wait to take this until your doctor or other care provider tells you to start again. 02/16/17   [provider]  bacitracin  ointment Apply topically daily. Apply to scalp laceration 01/17/24   Ghimire, Donalda HERO, MD  buPROPion  (WELLBUTRIN  XL) 150 MG 24 hr tablet Take 150 mg by mouth every morning.  10/26/10   Nahser, Aleene PARAS, MD  cetirizine (ZYRTEC) 10 MG tablet Take 10 mg by mouth daily.    [provider]  Cholecalciferol  (VITAMIN D -1000 MAX ST) 25 MCG (1000 UT) tablet Take 1,000 Units by mouth daily.    [provider]  cyanocobalamin  1000 MCG tablet Take 1 tablet (1,000 mcg total) by mouth daily. 01/17/24   Ghimire, Donalda HERO, MD  denosumab  (PROLIA ) 60  MG/ML SOSY injection Inject 60 mg into the skin every 6 (six) months. 09/05/13   [provider]  diltiazem  (DILT-XR) 180 MG 24 hr capsule Take 1 capsule (180 mg total) by mouth daily. 10/05/23   Nahser, Aleene PARAS, MD  [Paused] ezetimibe  (ZETIA ) 10 MG tablet Take 10 mg by mouth daily.  Wait to take this until your doctor or other care provider tells you to start again. 03/05/18   [provider]  feeding supplement (ENSURE PLUS HIGH PROTEIN) LIQD Take 237 mLs by mouth 3 (three) times daily between meals. 01/17/24   Ghimire, Donalda HERO, MD  fluticasone  (FLONASE ) 50 MCG/ACT nasal spray Place 2 sprays into both nostrils daily. Patient taking differently: Place 2 sprays into both nostrils daily as needed for allergies. 03/31/16   Geronimo Amel, MD  fluticasone  (FLOVENT  HFA) 110 MCG/ACT inhaler Inhale 2 puffs into the lungs 2 (two) times daily as needed (for shortness of breath). 03/09/16   Parrett, Madelin RAMAN, NP  metoprolol  tartrate (LOPRESSOR ) 50 MG tablet Take 1 tablet (50 mg total) by mouth 2 (two) times daily. 02/19/24   Kate Lonni CROME, MD  Multiple Vitamin (MULTIVITAMIN WITH MINERALS) TABS tablet Take 1 tablet by mouth daily.    [provider]  ondansetron  (ZOFRAN ) 4 MG tablet Take 4 mg by mouth every 6 (six) hours as needed for nausea or vomiting. 10/09/23  [provider]  oxyCODONE  (OXY IR/ROXICODONE ) 5 MG immediate release tablet Take 1 tablet (5 mg total) by mouth every 4 (four) hours as needed for moderate pain (pain score 4-6) or breakthrough pain. 01/17/24   Ghimire, Donalda HERO, MD  polyethylene glycol (MIRALAX  / GLYCOLAX ) 17 g packet Take 17 g by mouth daily. 01/17/24   Ghimire, Donalda HERO, MD  psyllium (METAMUCIL) 58.6 % powder Take 1 packet by mouth daily.    [provider]  QVAR REDIHALER 40 MCG/ACT inhaler Inhale 2 puffs into the lungs 2 (two) times daily as needed (sob). 10/06/23   [provider]  senna (SENOKOT) 8.6 MG TABS tablet Take  2 tablets (17.2 mg total) by mouth daily. 01/17/24   Ghimire, Donalda HERO, MD  Zinc  50 MG TABS 1 tablet Orally Once a day    [provider]    Allergies: Warfarin and related, Amoxicillin , Augmentin  [amoxicillin -pot clavulanate], and Avelox [moxifloxacin]    Review of Systems  Updated Vital Signs BP (!) 148/69   Pulse 94   Temp (!) 101.7 F (38.7 C) (Oral)   Resp 18   Wt 64.9 kg   SpO2 94%   BMI 23.08 kg/m   Physical Exam Vitals and nursing note reviewed.  Constitutional:      General: She is not in acute distress.    Appearance: She is well-developed. She is not ill-appearing.  HENT:     Head: Normocephalic and atraumatic.     Nose: Nose normal.     Mouth/Throat:     Mouth: Mucous membranes are moist.  Eyes:     Extraocular Movements: Extraocular movements intact.     Conjunctiva/sclera: Conjunctivae normal.     Pupils: Pupils are equal, round, and reactive to light.  Cardiovascular:     Rate and Rhythm: Normal rate and regular rhythm.     Pulses: Normal pulses.     Heart sounds: Normal heart sounds. No murmur heard. Pulmonary:     Effort: Pulmonary effort is normal. No respiratory distress.     Breath sounds: Normal breath sounds.  Abdominal:     Palpations: Abdomen is soft.     Tenderness: There is no abdominal tenderness.  Musculoskeletal:        General: No swelling.     Cervical back: Normal range of motion and neck supple.  Skin:    General: Skin is warm and dry.     Capillary Refill: Capillary refill takes less than 2 seconds.  Neurological:     General: No focal deficit present.     Mental Status: She is alert.  Psychiatric:        Mood and Affect: Mood normal.     (all labs ordered are listed, but only abnormal results are displayed) Labs Reviewed  RESP PANEL BY RT-PCR (RSV, FLU A&B, COVID)  [id number]    EKG: None  Radiology: DG Chest Portable 1 View Result Date: 07/20/2024 EXAM: 1 VIEW(S) XRAY OF THE CHEST 07/20/2024 09:57:36 PM  COMPARISON: 01/13/2024 CLINICAL HISTORY: cough FINDINGS: LUNGS AND PLEURA: No focal pulmonary opacity. No pleural effusion. No pneumothorax. HEART AND MEDIASTINUM: Aortic atherosclerosis. Metallic linear densities project over the midline of the chest at the level of the aortic arch, presumably external to the patient. Recommend clinical correlation. BONES AND SOFT TISSUES: No acute osseous abnormality. IMPRESSION: 1. No acute cardiopulmonary findings. 2. Metallic linear densities project over the midline of the chest at the level of the aortic arch, presumably external to the patient; recommend  clinical correlation. Electronically signed by: Franky Crease MD 07/20/2024 10:06 PM EST RP Workstation: [id number]     Procedures   Medications Ordered in the ED  acetaminophen  (TYLENOL ) tablet 1,000 mg (1,000 mg Oral Given 07/20/24 2151)                                    Medical Decision Making Amount and/or Complexity of Data Reviewed Radiology: ordered.  Risk OTC drugs.   [name] is here with flulike illness.  Low-grade fever.  Very well-appearing.  Symptoms for less than 2 days.  No cough no sputum production.  She is here because she would like Tamiflu  for influenza.  She denies any weakness numbness tingling.  She has not had any nausea or vomiting.  She has been eating and drinking well.  She has not taken anything for fever.  Fever here but otherwise unremarkable vitals.  Patient given Tylenol  here.  Chest x-ray showed no evidence of pneumonia pneumothorax.  Overall metallic densities are likely her necklace that she is wearing.  COVID flu RSV test negative but I do suspect a viral process.  But she is very mildly symptomatic.  She is well-appearing.  Discharged in good condition.  Understands return precautions.  This chart was dictated using voice recognition software.  Despite best efforts to proofread,  errors can occur which can change the documentation meaning.      Final  diagnoses:  Flu-like symptoms  Fever, unspecified fever cause    ED Discharge Orders     None          Ruthe Cornet, DO 07/20/24 2232  "

## 2024-07-21 NOTE — Progress Notes (Unsigned)
 " Cardiology Office Note:    Date:  07/23/2024   ID:  Monica Williamson, DOB 11/23/1940, MRN [id number]  PCP:  [name]., MD  Cardiologist:  Lonni LITTIE Nanas, MD  Electrophysiologist:  None   Referring MD: [name]., MD   Chief Complaint  Patient presents with   Atrial Fibrillation    History of Present Illness:    Monica Williamson is a 84 y.o. female with a hx of permanent atrial fibrillation, hypertension who presents for follow-up.  Previously followed with Dr. Alveta.  Echocardiogram 09/2021 showed EF 65 to 70%, normal RV function, mild to moderate mitral regurgitation.  Since last clinic visit, reports she is doing well.  She is recovering from bilateral heel fractures and ankle fracture this summer.  Has been doing PT. Denies any chest pain, dyspnea, lightheadedness, syncope, lower extremity edema, or palpitations.  Denies any bleeding on Eliquis .   Past Medical History:  Diagnosis Date   Anxiety    Arthritis    Atrial fibrillation (HCC)    when takes Warfarin   Atrial fibrillation, currently in sinus rhythm    Bronchiectasis (HCC)    contolled with lovent and allergy med   Childhood asthma    Cholelithiasis    Colon polyps    Complication of anesthesia    Depression    Diverticulosis    Dysrhythmia    sees Dr Alveta annually for h/o Afib   H/O bronchiectasis    History of airborne allergies    uses inhalers   Hypertension    Nocturia    Osteopenia    PONV (postoperative nausea and vomiting)    severe n/v post anesthesia    Pulmonary nodule    Sleep related teeth grinding    wears a mouth guard at night   Stress fracture of tibia 2014   Vitamin D  deficiency     Past Surgical History:  Procedure Laterality Date   BREAST CYST EXCISION Right    over 20 years ago   BREAST SURGERY     CARDIOVERSION N/A 06/27/2019   Procedure: CARDIOVERSION;  Surgeon: Nahser, Aleene PARAS, MD;  Location: [facility] ENDOSCOPY;  Service: Cardiovascular;   Laterality: N/A;   CHOLECYSTECTOMY     11/2010   INCISION AND DRAINAGE BREAST ABSCESS     JOINT REPLACEMENT Right 09/2013   knee   MENISCUS REPAIR Right 2014   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   Procedure: RIGHT TOTAL KNEE ARTHROPLASTY;  Surgeon: Marcey Raman, MD;  Location: [facility] OR;  Service: Orthopedics;  Laterality: Right;   TOTAL KNEE ARTHROPLASTY Left 02/24/2014   dr raman   TOTAL KNEE ARTHROPLASTY Left 02/24/2014   Procedure: TOTAL KNEE ARTHROPLASTY;  Surgeon: Marcey Raman, MD;  Location: [facility] OR;  Service: Orthopedics;  Laterality: Left;   VAGINAL HYSTERECTOMY     VESICOVAGINAL FISTULA CLOSURE W/ TAH      Current Medications: Active Medications[1]   Allergies:   Warfarin and related, Amoxicillin , Augmentin  [amoxicillin -pot clavulanate], and Avelox [moxifloxacin]   Social History   Socioeconomic History   Marital status: Married    Spouse name: Not on file   Number of children: 3   Years of education: Not on file   Highest education level: Not on file  Occupational History   Occupation: Retired  Tobacco Use   Smoking status: Former    Current packs/day: 0.00    Average packs/day: 0.8 packs/day for 20.0 years (15.0 ttl pk-yrs)    Types: Cigarettes  Start date: 07/18/1958    Quit date: 07/18/1978    Years since quitting: 46.0   Smokeless tobacco: Never  Substance and Sexual Activity   Alcohol  use: Not on file    Comment: daily cocktail   Drug use: No   Sexual activity: Yes    Birth control/protection: Post-menopausal  Other Topics Concern   Not on file  Social History Narrative   Not on file   Social Drivers of Health   Tobacco Use: Medium Risk (07/23/2024)   Patient History    Smoking Tobacco Use: Former    Smokeless Tobacco Use: Never    Passive Exposure: Not on Actuary Strain: Not on file  Food Insecurity: No Food Insecurity (01/14/2024)   Epic    Worried About Programme Researcher, Broadcasting/film/video in the Last Year: Never true    Ran Out of Food in the  Last Year: Never true  Transportation Needs: No Transportation Needs (01/14/2024)   Epic    Lack of Transportation (Medical): No    Lack of Transportation (Non-Medical): No  Physical Activity: Not on file  Stress: Not on file  Social Connections: Socially Integrated (01/14/2024)   Social Connection and Isolation Panel    Frequency of Communication with Friends and Family: More than three times a week    Frequency of Social Gatherings with Friends and Family: Twice a week    Attends Religious Services: 1 to 4 times per year    Active Member of [employer] or Organizations: Yes    Attends Banker Meetings: 1 to 4 times per year    Marital Status: Married  Depression (PHQ2-9): Not on file  Alcohol  Screen: Not on file  Housing: Unknown (01/14/2024)   Epic    Unable to Pay for Housing in the Last Year: No    Number of Times Moved in the Last Year: Not on file    Homeless in the Last Year: Patient declined  Utilities: Not At Risk (01/14/2024)   Epic    Threatened with loss of utilities: No  Health Literacy: Not on file     Family History: The patient's family history includes Heart disease in her sister. There is no history of Colon cancer.  ROS:   Please see the history of present illness.     All other systems reviewed and are negative.  EKGs/Labs/Other Studies Reviewed:    The following studies were reviewed today:   EKG:  EKG is not ordered today.   Recent Labs: 10/09/2023: B Natriuretic Peptide 570.5 01/17/2024: ALT 110; BUN 13; Creatinine, Ser 0.70; Hemoglobin 8.8; Magnesium 1.8; Platelets 177; Potassium 4.1; Sodium 135  Recent Lipid Panel No results found for: CHOL, TRIG, HDL, CHOLHDL, VLDL, LDLCALC, LDLDIRECT  Physical Exam:    VS:  BP 130/62   Pulse 71   Ht 5' 7 (1.702 m)   Wt 147 lb (66.7 kg)   SpO2 98%   BMI 23.02 kg/m     Wt Readings from Last 3 Encounters:  07/23/24 147 lb (66.7 kg)  07/20/24 143 lb (64.9 kg)  01/13/24 143 lb (64.9  kg)     GEN:  Well nourished, well developed in no acute distress HEENT: Normal NECK: No JVD; No carotid bruits LYMPHATICS: No lymphadenopathy CARDIAC: Irregular, normal rate, no murmurs, rubs, gallops RESPIRATORY:  Clear to auscultation without rales, wheezing or rhonchi  ABDOMEN: Soft, non-tender, non-distended MUSCULOSKELETAL:  No edema; No deformity  SKIN: Warm and dry NEUROLOGIC:  Alert and oriented x  3 PSYCHIATRIC:  Normal affect   ASSESSMENT:    1. Permanent atrial fibrillation (HCC)   2. Mitral valve insufficiency, unspecified etiology   3. Hyperlipidemia, unspecified hyperlipidemia type   4. Medication management   5. Essential hypertension    PLAN:    Permanent atrial fibrillation: CHA2DS2-VASc 4 (hypertension, age x 2, vascular disease) - Continue Eliquis  5 mg twice daily.  Check CBC - Continue diltiazem  180 mg daily and metoprolol  50 mg twice daily.  Check Zio patch x 7 days to ensure adequate rate control  Mild regurgitation: Mild to moderate on echocardiogram in 2023.  Will update echo to monitor  Hypertension: Continue diltiazem  180 mg daily and metoprolol  50 mg twice daily  Hyperlipidemia: On zetia  10 mg daily atorvastatin  40 mg daily.  Update lipid panel  Carotid stenosis: Status post right carotid endarterectomy.  Continue Eliquis , statin  RTC in 1 year  Medication Adjustments/Labs and Tests Ordered: Current medicines are reviewed at length with the patient today.  Concerns regarding medicines are outlined above.  Orders Placed This Encounter  Procedures   Basic Metabolic Panel (BMET)   CBC   Lipid Profile   LONG TERM MONITOR (3-14 DAYS)   ECHOCARDIOGRAM COMPLETE   No orders of the defined types were placed in this encounter.   Patient Instructions  Medication Instructions:  Your physician recommends that you continue on your current medications as directed. Please refer to the Current Medication list given to you today.  *If you need a refill  on your cardiac medications before your next appointment, please call your pharmacy*  Lab Work: BMET, CBC, Lipid If you have labs (blood work) drawn today and your tests are completely normal, you will receive your results only by: MyChart Message (if you have MyChart) OR A paper copy in the mail If you have any lab test that is abnormal or we need to change your treatment, we will call you to review the results.  Testing/Procedures: Your physician has requested that you have an echocardiogram. Echocardiography is a painless test that uses sound waves to create images of your heart. It provides your doctor with information about the size and shape of your heart and how well your hearts chambers and valves are working. This procedure takes approximately one hour. There are no restrictions for this procedure. Please do NOT wear cologne, perfume, aftershave, or lotions (deodorant is allowed). Please arrive 15 minutes prior to your appointment time.  Please note: We ask at that you not bring children with you during ultrasound (echo/ vascular) testing. Due to room size and safety concerns, children are not allowed in the ultrasound rooms during exams. Our front office staff cannot provide observation of children in our lobby area while testing is being conducted. An adult accompanying a patient to their appointment will only be allowed in the ultrasound room at the discretion of the ultrasound technician under special circumstances. We apologize for any inconvenience.  ZIO XT- Long Term Monitor Instructions  Your physician has requested you wear a ZIO patch monitor for 7 days.  This is a single patch monitor. [employer] supplies one patch monitor per enrollment. Additional stickers are not available. Please do not apply patch if you will be having a Nuclear Stress Test,  Echocardiogram, Cardiac CT, MRI, or Chest Xray during the period you would be wearing the  monitor. The patch cannot be worn during  these tests. You cannot remove and re-apply the  ZIO XT patch monitor.  Your ZIO patch monitor  will be mailed 3 day USPS to your address on file. It may take 3-5 days  to receive your monitor after you have been enrolled.  Once you have received your monitor, please review the enclosed instructions. Your monitor  has already been registered assigning a specific monitor serial # to you.  Billing and Patient Assistance Program Information  We have supplied [employer] with any of your insurance information on file for billing purposes. [employer] offers a sliding scale Patient Assistance Program for patients that do not have  insurance, or whose insurance does not completely cover the cost of the ZIO monitor.  You must apply for the Patient Assistance Program to qualify for this discounted rate.  To apply, please call [employer] at [phone], select option 4, select option 2, ask to apply for  Patient Assistance Program. Meredeth will ask your household income, and how many people  are in your household. They will quote your out-of-pocket cost based on that information.  [employer] will also be able to set up a 25-month, interest-free payment plan if needed.  Applying the monitor   Shave hair from upper left chest.  Hold abrader disc by orange tab. Rub abrader in 40 strokes over the upper left chest as  indicated in your monitor instructions.  Clean area with 4 enclosed alcohol  pads. Let dry.  Apply patch as indicated in monitor instructions. Patch will be placed under collarbone on left  side of chest with arrow pointing upward.  Rub patch adhesive wings for 2 minutes. Remove white label marked 1. Remove the white  label marked 2. Rub patch adhesive wings for 2 additional minutes.  While looking in a mirror, press and release button in center of patch. A small green light will  flash 3-4 times. This will be your only indicator that the monitor has been turned on.  Do not shower for the first 24  hours. You may shower after the first 24 hours.  Press the button if you feel a symptom. You will hear a small click. Record Date, Time and  Symptom in the Patient Logbook.  When you are ready to remove the patch, follow instructions on the last 2 pages of Patient  Logbook. Stick patch monitor onto the last page of Patient Logbook.  Place Patient Logbook in the blue and white box. Use locking tab on box and tape box closed  securely. The blue and white box has prepaid postage on it. Please place it in the mailbox as  soon as possible. Your physician should have your test results approximately 7 days after the  monitor has been mailed back to [facility].  Call [facility] Customer Care at [phone] if you have questions regarding  your ZIO XT patch monitor. Call them immediately if you see an orange light blinking on your  monitor.  If your monitor falls off in less than 4 days, contact our Monitor department at [phone].  If your monitor becomes loose or falls off after 4 days call [employer] at [phone] for  suggestions on securing your monitor   Follow-Up: At [facility], you and your health needs are our priority.  As part of our continuing mission to provide you with exceptional heart care, our providers are all part of one team.  This team includes your primary Cardiologist (physician) and Advanced Practice Providers or APPs (Physician Assistants and Nurse Practitioners) who all work together to provide you with the care you need, when you need it.  Your next  appointment:   1 year(s)  Provider:   Lonni LITTIE Nanas, MD     Signed, Lonni LITTIE Nanas, MD  07/23/2024 10:42 AM    [facility]     [1]  Current Meds  Medication Sig   albuterol  (PROVENTIL  HFA;VENTOLIN  HFA) 108 (90 Base) MCG/ACT inhaler Inhale 2 puffs into the lungs 2 (two) times daily as needed for wheezing or shortness of breath.   ALPRAZolam  (XANAX ) 0.5 MG  tablet Take 0.5 tablets (0.25 mg total) by mouth at bedtime as needed for anxiety or sleep.   apixaban  (ELIQUIS ) 5 MG TABS tablet Take 1 tablet (5 mg total) by mouth 2 (two) times daily.   [Paused] atorvastatin  (LIPITOR) 40 MG tablet Take 40 mg by mouth daily at 6 PM.    bacitracin  ointment Apply topically daily. Apply to scalp laceration   buPROPion  (WELLBUTRIN  XL) 150 MG 24 hr tablet Take 150 mg by mouth every morning.    cetirizine (ZYRTEC) 10 MG tablet Take 10 mg by mouth daily.   Cholecalciferol  (VITAMIN D -1000 MAX ST) 25 MCG (1000 UT) tablet Take 1,000 Units by mouth daily.   cyanocobalamin  1000 MCG tablet Take 1 tablet (1,000 mcg total) by mouth daily.   denosumab  (PROLIA ) 60 MG/ML SOSY injection Inject 60 mg into the skin every 6 (six) months.   diltiazem  (DILT-XR) 180 MG 24 hr capsule Take 1 capsule (180 mg total) by mouth daily.   [Paused] ezetimibe  (ZETIA ) 10 MG tablet Take 10 mg by mouth daily.    feeding supplement (ENSURE PLUS HIGH PROTEIN) LIQD Take 237 mLs by mouth 3 (three) times daily between meals.   fluticasone  (FLONASE ) 50 MCG/ACT nasal spray Place 2 sprays into both nostrils daily. (Patient taking differently: Place 2 sprays into both nostrils daily as needed for allergies.)   fluticasone  (FLOVENT  HFA) 110 MCG/ACT inhaler Inhale 2 puffs into the lungs 2 (two) times daily as needed (for shortness of breath).   metoprolol  tartrate (LOPRESSOR ) 50 MG tablet Take 1 tablet (50 mg total) by mouth 2 (two) times daily.   Multiple Vitamin (MULTIVITAMIN WITH MINERALS) TABS tablet Take 1 tablet by mouth daily.   ondansetron  (ZOFRAN ) 4 MG tablet Take 4 mg by mouth every 6 (six) hours as needed for nausea or vomiting.   oxyCODONE  (OXY IR/ROXICODONE ) 5 MG immediate release tablet Take 1 tablet (5 mg total) by mouth every 4 (four) hours as needed for moderate pain (pain score 4-6) or breakthrough pain.   polyethylene glycol (MIRALAX  / GLYCOLAX ) 17 g packet Take 17 g by mouth daily.    psyllium (METAMUCIL) 58.6 % powder Take 1 packet by mouth daily.   QVAR REDIHALER 40 MCG/ACT inhaler Inhale 2 puffs into the lungs 2 (two) times daily as needed (sob).   senna (SENOKOT) 8.6 MG TABS tablet Take 2 tablets (17.2 mg total) by mouth daily.   Zinc  50 MG TABS 1 tablet Orally Once a day   "

## 2024-07-23 ENCOUNTER — Encounter: Payer: Self-pay | Admitting: Cardiology

## 2024-07-23 ENCOUNTER — Ambulatory Visit: Attending: Cardiology | Admitting: Cardiology

## 2024-07-23 ENCOUNTER — Ambulatory Visit

## 2024-07-23 VITALS — BP 130/62 | HR 71 | Ht 67.0 in | Wt 147.0 lb

## 2024-07-23 DIAGNOSIS — I340 Nonrheumatic mitral (valve) insufficiency: Secondary | ICD-10-CM | POA: Diagnosis not present

## 2024-07-23 DIAGNOSIS — I4821 Permanent atrial fibrillation: Secondary | ICD-10-CM

## 2024-07-23 DIAGNOSIS — Z79899 Other long term (current) drug therapy: Secondary | ICD-10-CM

## 2024-07-23 DIAGNOSIS — I10 Essential (primary) hypertension: Secondary | ICD-10-CM

## 2024-07-23 DIAGNOSIS — E785 Hyperlipidemia, unspecified: Secondary | ICD-10-CM

## 2024-07-23 DIAGNOSIS — I4819 Other persistent atrial fibrillation: Secondary | ICD-10-CM

## 2024-07-23 NOTE — Patient Instructions (Signed)
 Medication Instructions:  Your physician recommends that you continue on your current medications as directed. Please refer to the Current Medication list given to you today.  *If you need a refill on your cardiac medications before your next appointment, please call your pharmacy*  Lab Work: BMET, CBC, Lipid If you have labs (blood work) drawn today and your tests are completely normal, you will receive your results only by: MyChart Message (if you have MyChart) OR A paper copy in the mail If you have any lab test that is abnormal or we need to change your treatment, we will call you to review the results.  Testing/Procedures: Your physician has requested that you have an echocardiogram. Echocardiography is a painless test that uses sound waves to create images of your heart. It provides your doctor with information about the size and shape of your heart and how well your hearts chambers and valves are working. This procedure takes approximately one hour. There are no restrictions for this procedure. Please do NOT wear cologne, perfume, aftershave, or lotions (deodorant is allowed). Please arrive 15 minutes prior to your appointment time.  Please note: We ask at that you not bring children with you during ultrasound (echo/ vascular) testing. Due to room size and safety concerns, children are not allowed in the ultrasound rooms during exams. Our front office staff cannot provide observation of children in our lobby area while testing is being conducted. An adult accompanying a patient to their appointment will only be allowed in the ultrasound room at the discretion of the ultrasound technician under special circumstances. We apologize for any inconvenience.  ZIO XT- Long Term Monitor Instructions  Your physician has requested you wear a ZIO patch monitor for 7 days.  This is a single patch monitor. [employer] supplies one patch monitor per enrollment. Additional stickers are not available.  Please do not apply patch if you will be having a Nuclear Stress Test,  Echocardiogram, Cardiac CT, MRI, or Chest Xray during the period you would be wearing the  monitor. The patch cannot be worn during these tests. You cannot remove and re-apply the  ZIO XT patch monitor.  Your ZIO patch monitor will be mailed 3 day USPS to your address on file. It may take 3-5 days  to receive your monitor after you have been enrolled.  Once you have received your monitor, please review the enclosed instructions. Your monitor  has already been registered assigning a specific monitor serial # to you.  Billing and Patient Assistance Program Information  We have supplied [employer] with any of your insurance information on file for billing purposes. [employer] offers a sliding scale Patient Assistance Program for patients that do not have  insurance, or whose insurance does not completely cover the cost of the ZIO monitor.  You must apply for the Patient Assistance Program to qualify for this discounted rate.  To apply, please call [employer] at [phone], select option 4, select option 2, ask to apply for  Patient Assistance Program. [employer] will ask your household income, and how many people  are in your household. They will quote your out-of-pocket cost based on that information.  [employer] will also be able to set up a 58-month, interest-free payment plan if needed.  Applying the monitor   Shave hair from upper left chest.  Hold abrader disc by orange tab. Rub abrader in 40 strokes over the upper left chest as  indicated in your monitor instructions.  Clean area with 4 enclosed alcohol  pads.  Let dry.  Apply patch as indicated in monitor instructions. Patch will be placed under collarbone on left  side of chest with arrow pointing upward.  Rub patch adhesive wings for 2 minutes. Remove white label marked 1. Remove the white  label marked 2. Rub patch adhesive wings for 2 additional minutes.  While  looking in a mirror, press and release button in center of patch. A small green light will  flash 3-4 times. This will be your only indicator that the monitor has been turned on.  Do not shower for the first 24 hours. You may shower after the first 24 hours.  Press the button if you feel a symptom. You will hear a small click. Record Date, Time and  Symptom in the Patient Logbook.  When you are ready to remove the patch, follow instructions on the last 2 pages of Patient  Logbook. Stick patch monitor onto the last page of Patient Logbook.  Place Patient Logbook in the blue and white box. Use locking tab on box and tape box closed  securely. The blue and white box has prepaid postage on it. Please place it in the mailbox as  soon as possible. Your physician should have your test results approximately 7 days after the  monitor has been mailed back to [facility].  Call [facility] Customer Care at [phone] if you have questions regarding  your ZIO XT patch monitor. Call them immediately if you see an orange light blinking on your  monitor.  If your monitor falls off in less than 4 days, contact our Monitor department at [phone].  If your monitor becomes loose or falls off after 4 days call [employer] at [phone] for  suggestions on securing your monitor   Follow-Up: At [facility], you and your health needs are our priority.  As part of our continuing mission to provide you with exceptional heart care, our providers are all part of one team.  This team includes your primary Cardiologist (physician) and Advanced Practice Providers or APPs (Physician Assistants and Nurse Practitioners) who all work together to provide you with the care you need, when you need it.  Your next appointment:   1 year(s)  Provider:   Lonni LITTIE Nanas, MD

## 2024-07-23 NOTE — Progress Notes (Unsigned)
 Enrolled for [employer] to mail a ZIO XT long term holter monitor to the patients address on file.

## 2024-07-24 ENCOUNTER — Ambulatory Visit: Payer: Self-pay | Admitting: Cardiology

## 2024-07-24 LAB — LIPID PANEL
Chol/HDL Ratio: 1.8 ratio (ref 0.0–4.4)
Cholesterol, Total: 109 mg/dL (ref 100–199)
HDL: 60 mg/dL
LDL Chol Calc (NIH): 34 mg/dL (ref 0–99)
Triglycerides: 72 mg/dL (ref 0–149)
VLDL Cholesterol Cal: 15 mg/dL (ref 5–40)

## 2024-07-24 LAB — CBC
Hematocrit: 37.8 % (ref 34.0–46.6)
Hemoglobin: 12.6 g/dL (ref 11.1–15.9)
MCH: 33.3 pg — ABNORMAL HIGH (ref 26.6–33.0)
MCHC: 33.3 g/dL (ref 31.5–35.7)
MCV: 100 fL — ABNORMAL HIGH (ref 79–97)
Platelets: 295 x10E3/uL (ref 150–450)
RBC: 3.78 x10E6/uL (ref 3.77–5.28)
RDW: 13.8 % (ref 11.7–15.4)
WBC: 6.5 x10E3/uL (ref 3.4–10.8)

## 2024-07-24 LAB — BASIC METABOLIC PANEL WITH GFR
BUN/Creatinine Ratio: 13 (ref 12–28)
BUN: 12 mg/dL (ref 8–27)
CO2: 21 mmol/L (ref 20–29)
Calcium: 9.7 mg/dL (ref 8.7–10.3)
Chloride: 100 mmol/L (ref 96–106)
Creatinine, Ser: 0.89 mg/dL (ref 0.57–1.00)
Glucose: 85 mg/dL (ref 70–99)
Potassium: 4.6 mmol/L (ref 3.5–5.2)
Sodium: 138 mmol/L (ref 134–144)
eGFR: 63 mL/min/1.73

## 2024-08-13 DIAGNOSIS — I4819 Other persistent atrial fibrillation: Secondary | ICD-10-CM

## 2024-08-19 ENCOUNTER — Other Ambulatory Visit: Payer: Self-pay

## 2024-08-19 MED ORDER — DILTIAZEM HCL ER 180 MG PO CP24: 180.0000 mg | ORAL_CAPSULE | Freq: Every day | ORAL | 2 refills | Status: AC

## 2024-08-21 ENCOUNTER — Other Ambulatory Visit: Payer: Self-pay | Admitting: Cardiology

## 2024-08-22 ENCOUNTER — Ambulatory Visit (HOSPITAL_COMMUNITY)

## 2024-09-25 ENCOUNTER — Ambulatory Visit (HOSPITAL_COMMUNITY)

## 2024-12-24 ENCOUNTER — Encounter (HOSPITAL_COMMUNITY)
